# Patient Record
Sex: Female | Born: 1940 | Race: White | Hispanic: No | Smoking: Never smoker
Health system: Southern US, Community
[De-identification: ages and names within clinical notes are randomized; demographics above are authoritative.]

## PROBLEM LIST (undated history)

## (undated) DIAGNOSIS — I4891 Unspecified atrial fibrillation: Secondary | ICD-10-CM

## (undated) DIAGNOSIS — I10 Essential (primary) hypertension: Secondary | ICD-10-CM

## (undated) DIAGNOSIS — I509 Heart failure, unspecified: Secondary | ICD-10-CM

## (undated) DIAGNOSIS — G4730 Sleep apnea, unspecified: Secondary | ICD-10-CM

## (undated) HISTORY — PX: POLYPECTOMY: SHX149

## (undated) HISTORY — PX: CARPAL TUNNEL RELEASE: SHX101

## (undated) HISTORY — PX: EYE SURGERY: SHX253

---

## 2003-05-22 LAB — HM DEXA SCAN: HM Dexa Scan: NORMAL

## 2003-07-21 ENCOUNTER — Other Ambulatory Visit: Payer: Self-pay

## 2004-02-06 ENCOUNTER — Ambulatory Visit: Payer: Self-pay | Admitting: Gastroenterology

## 2005-02-18 ENCOUNTER — Ambulatory Visit: Payer: Self-pay

## 2005-03-03 ENCOUNTER — Ambulatory Visit: Payer: Self-pay

## 2005-08-22 ENCOUNTER — Ambulatory Visit: Payer: Self-pay | Admitting: General Surgery

## 2006-07-27 ENCOUNTER — Ambulatory Visit: Payer: Self-pay | Admitting: Specialist

## 2006-11-19 ENCOUNTER — Ambulatory Visit: Payer: Self-pay

## 2006-11-25 ENCOUNTER — Ambulatory Visit: Payer: Self-pay

## 2007-07-12 ENCOUNTER — Ambulatory Visit: Payer: Self-pay | Admitting: General Surgery

## 2007-11-17 ENCOUNTER — Ambulatory Visit: Payer: Self-pay | Admitting: Unknown Physician Specialty

## 2008-11-09 ENCOUNTER — Ambulatory Visit: Payer: Self-pay

## 2009-10-11 ENCOUNTER — Ambulatory Visit: Payer: Self-pay | Admitting: Family Medicine

## 2010-02-15 ENCOUNTER — Ambulatory Visit: Payer: Self-pay

## 2011-04-16 ENCOUNTER — Ambulatory Visit: Payer: Self-pay | Admitting: Family Medicine

## 2011-04-16 ENCOUNTER — Ambulatory Visit: Payer: Self-pay

## 2012-04-20 ENCOUNTER — Ambulatory Visit: Payer: Self-pay

## 2013-02-24 ENCOUNTER — Ambulatory Visit: Payer: Self-pay | Admitting: Family Medicine

## 2013-03-03 ENCOUNTER — Ambulatory Visit: Payer: Self-pay | Admitting: Unknown Physician Specialty

## 2013-03-03 LAB — HM COLONOSCOPY

## 2013-03-07 LAB — PATHOLOGY REPORT

## 2013-06-13 ENCOUNTER — Ambulatory Visit: Payer: Self-pay | Admitting: Family Medicine

## 2013-07-15 DIAGNOSIS — G4730 Sleep apnea, unspecified: Secondary | ICD-10-CM | POA: Insufficient documentation

## 2013-07-15 DIAGNOSIS — I480 Paroxysmal atrial fibrillation: Secondary | ICD-10-CM | POA: Insufficient documentation

## 2013-07-15 DIAGNOSIS — I10 Essential (primary) hypertension: Secondary | ICD-10-CM | POA: Insufficient documentation

## 2013-07-15 DIAGNOSIS — Z9889 Other specified postprocedural states: Secondary | ICD-10-CM | POA: Insufficient documentation

## 2013-09-13 LAB — LIPID PANEL
CHOLESTEROL: 169 mg/dL (ref 0–200)
HDL: 46 mg/dL (ref 35–70)
LDL Cholesterol: 99 mg/dL
LDL/HDL RATIO: 2.2
Triglycerides: 122 mg/dL (ref 40–160)

## 2013-09-13 LAB — CBC AND DIFFERENTIAL
HEMATOCRIT: 38 % (ref 36–46)
Hemoglobin: 13.3 g/dL (ref 12.0–16.0)
Neutrophils Absolute: 60 /uL
PLATELETS: 231 10*3/uL (ref 150–399)
WBC: 8.3 10*3/mL

## 2013-09-13 LAB — TSH: TSH: 1.36 u[IU]/mL (ref <=5.90)

## 2013-09-13 LAB — BASIC METABOLIC PANEL
BUN: 12 mg/dL (ref 4–21)
Creatinine: 0.7 mg/dL (ref <=1.1)
Glucose: 115 mg/dL
Potassium: 3.7 mmol/L (ref 3.4–5.3)
Sodium: 142 mmol/L (ref 137–147)

## 2013-09-13 LAB — HEPATIC FUNCTION PANEL
ALT: 9 U/L (ref 7–35)
AST: 15 U/L (ref 13–35)
Alkaline Phosphatase: 74 U/L (ref 25–125)
BILIRUBIN, TOTAL: 0.5 mg/dL

## 2013-09-13 LAB — HEMOGLOBIN A1C: Hgb A1c MFr Bld: 5.8 % (ref 4.0–6.0)

## 2014-04-25 DIAGNOSIS — I4891 Unspecified atrial fibrillation: Secondary | ICD-10-CM | POA: Diagnosis not present

## 2014-05-10 DIAGNOSIS — E119 Type 2 diabetes mellitus without complications: Secondary | ICD-10-CM | POA: Diagnosis not present

## 2014-05-10 DIAGNOSIS — E669 Obesity, unspecified: Secondary | ICD-10-CM | POA: Diagnosis not present

## 2014-05-10 DIAGNOSIS — I10 Essential (primary) hypertension: Secondary | ICD-10-CM | POA: Diagnosis not present

## 2014-05-10 DIAGNOSIS — E785 Hyperlipidemia, unspecified: Secondary | ICD-10-CM | POA: Diagnosis not present

## 2014-05-23 DIAGNOSIS — I4891 Unspecified atrial fibrillation: Secondary | ICD-10-CM | POA: Diagnosis not present

## 2014-06-20 DIAGNOSIS — I4891 Unspecified atrial fibrillation: Secondary | ICD-10-CM | POA: Diagnosis not present

## 2014-06-25 DIAGNOSIS — J069 Acute upper respiratory infection, unspecified: Secondary | ICD-10-CM | POA: Diagnosis not present

## 2014-06-29 DIAGNOSIS — J069 Acute upper respiratory infection, unspecified: Secondary | ICD-10-CM | POA: Diagnosis not present

## 2014-07-12 ENCOUNTER — Ambulatory Visit
Admit: 2014-07-12 | Disposition: A | Discharge status: Home / Self Care | Payer: Self-pay | Attending: Family Medicine | Admitting: Family Medicine

## 2014-07-12 DIAGNOSIS — Z1231 Encounter for screening mammogram for malignant neoplasm of breast: Secondary | ICD-10-CM | POA: Diagnosis not present

## 2014-07-18 DIAGNOSIS — I4891 Unspecified atrial fibrillation: Secondary | ICD-10-CM | POA: Diagnosis not present

## 2014-08-15 DIAGNOSIS — I482 Chronic atrial fibrillation: Secondary | ICD-10-CM | POA: Diagnosis not present

## 2014-08-25 DIAGNOSIS — G4733 Obstructive sleep apnea (adult) (pediatric): Secondary | ICD-10-CM | POA: Insufficient documentation

## 2014-08-25 DIAGNOSIS — I10 Essential (primary) hypertension: Secondary | ICD-10-CM | POA: Insufficient documentation

## 2014-08-25 DIAGNOSIS — G5790 Unspecified mononeuropathy of unspecified lower limb: Secondary | ICD-10-CM | POA: Insufficient documentation

## 2014-08-25 DIAGNOSIS — F419 Anxiety disorder, unspecified: Secondary | ICD-10-CM | POA: Insufficient documentation

## 2014-08-25 DIAGNOSIS — F329 Major depressive disorder, single episode, unspecified: Secondary | ICD-10-CM | POA: Insufficient documentation

## 2014-08-25 DIAGNOSIS — E669 Obesity, unspecified: Secondary | ICD-10-CM | POA: Insufficient documentation

## 2014-08-25 DIAGNOSIS — D126 Benign neoplasm of colon, unspecified: Secondary | ICD-10-CM | POA: Insufficient documentation

## 2014-08-25 DIAGNOSIS — E7800 Pure hypercholesterolemia, unspecified: Secondary | ICD-10-CM | POA: Insufficient documentation

## 2014-08-25 DIAGNOSIS — E119 Type 2 diabetes mellitus without complications: Secondary | ICD-10-CM | POA: Insufficient documentation

## 2014-08-25 DIAGNOSIS — F32A Depression, unspecified: Secondary | ICD-10-CM | POA: Insufficient documentation

## 2014-08-25 DIAGNOSIS — E1140 Type 2 diabetes mellitus with diabetic neuropathy, unspecified: Secondary | ICD-10-CM | POA: Insufficient documentation

## 2014-08-25 DIAGNOSIS — I482 Chronic atrial fibrillation, unspecified: Secondary | ICD-10-CM | POA: Insufficient documentation

## 2014-08-25 DIAGNOSIS — E538 Deficiency of other specified B group vitamins: Secondary | ICD-10-CM | POA: Insufficient documentation

## 2014-08-25 DIAGNOSIS — Z229 Carrier of infectious disease, unspecified: Secondary | ICD-10-CM | POA: Insufficient documentation

## 2014-09-06 ENCOUNTER — Other Ambulatory Visit: Payer: Self-pay | Admitting: Family Medicine

## 2014-09-06 ENCOUNTER — Encounter: Payer: Self-pay | Admitting: Family Medicine

## 2014-09-06 ENCOUNTER — Other Ambulatory Visit: Payer: Self-pay

## 2014-09-06 NOTE — Telephone Encounter (Signed)
This has a controlled Rx and will be filled by Dr. Darnell Level

## 2014-09-12 DIAGNOSIS — I482 Chronic atrial fibrillation: Secondary | ICD-10-CM | POA: Diagnosis not present

## 2014-09-15 ENCOUNTER — Other Ambulatory Visit: Payer: Self-pay | Admitting: Family Medicine

## 2014-10-02 ENCOUNTER — Other Ambulatory Visit: Payer: Self-pay | Admitting: Family Medicine

## 2014-10-11 ENCOUNTER — Ambulatory Visit (INDEPENDENT_AMBULATORY_CARE_PROVIDER_SITE_OTHER): Payer: Medicare Other | Admitting: Family Medicine

## 2014-10-11 ENCOUNTER — Encounter: Payer: Self-pay | Admitting: Family Medicine

## 2014-10-11 VITALS — BP 158/78 | HR 88 | Temp 98.2°F | Resp 16 | Ht 59.75 in | Wt 200.0 lb

## 2014-10-11 DIAGNOSIS — Z0000 Encounter for general adult medical examination without abnormal findings: Secondary | ICD-10-CM | POA: Diagnosis not present

## 2014-10-11 NOTE — Progress Notes (Signed)
Patient ID: Hannah Faulkner, female   DOB: 11-11-1940, 74 y.o.   MRN: [id number] Patient: Hannah Faulkner, Female    DOB: [birth year], 74 y.o.   MRN: [id number] Visit Date: 10/11/2014  Today's Provider: Wilhemena Durie, MD   Chief Complaint  Patient presents with  . Annual Exam   Subjective:   Hannah Faulkner is a 74 y.o. female who presents today for her Subsequent Annual Wellness Visit. She feels well. She reports she is not exercising. She reports she is sleeping well.  Review of Systems  Constitutional: Negative.   HENT: Negative.   Eyes: Negative.   Respiratory: Negative.   Cardiovascular: Negative.   Gastrointestinal: Negative.   Endocrine: Negative.   Genitourinary: Negative.   Musculoskeletal: Negative.   Skin: Negative.   Allergic/Immunologic: Negative.   Neurological: Negative.   Hematological: Negative.   Psychiatric/Behavioral: Negative.     Patient Active Problem List   Diagnosis Date Noted  . Anxiety 08/25/2014  . A-fib 08/25/2014  . Benign neoplasm of colon 08/25/2014  . 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 Diabetic neuropathy 08/25/2014  . Essential (primary) hypertension 08/25/2014  . 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 Adiposity 08/25/2014  . Obstructive apnea 08/25/2014  . Hypercholesterolemia without hypertriglyceridemia 08/25/2014  . B12 deficiency 08/25/2014  . H/O cardiac catheterization 07/15/2013  . BP (high blood pressure) 07/15/2013  . Apnea, sleep 07/15/2013  . AF (paroxysmal atrial fibrillation) 07/15/2013    History   Social History  . Marital Status: Widowed    Spouse Name: N/A  . Number of Children: N/A  . Years of Education: N/A   Occupational History  . Not on file.   Social History Main Topics  . Smoking status: Never Smoker   . Smokeless tobacco: Not on file  . Alcohol Use: No  . Drug Use: No  . Sexual Activity: Not on file   Other Topics Concern   . Not on file   Social History Narrative    Past Surgical History  Procedure Laterality Date  . Polypectomy      colon poly premoved    Her family history includes Atrial fibrillation in her sister; CVA in her mother and sister; Dementia in her sister; Diabetes in her sister; Heart attack in her mother and sister; Heart disease in her mother; Hypertension in her mother; Liver cancer in her brother; Lung cancer in her father; Thyroid disease in her sister, sister, and sister.    Outpatient Prescriptions Prior to Visit  Medication Sig Dispense Refill  . flecainide (TAMBOCOR) 50 MG tablet Take by mouth.    . gabapentin (NEURONTIN) 100 MG capsule TAKE ONE CAPSULE BY MOUTH 3 TIMES A DAY 90 capsule 4  . hydrALAZINE (APRESOLINE) 25 MG tablet Take by mouth.    . metoprolol (LOPRESSOR) 50 MG tablet Take by mouth.    . potassium chloride (K-DUR) 10 MEQ tablet Take by mouth.    . Pyridoxine HCl (VITAMIN B6) 200 MG TABS Take by mouth.    . triamterene-hydrochlorothiazide (MAXZIDE-25) 37.5-25 MG per tablet TAKE 1 TABLET BY MOUTH EVERY DAY 30 tablet 4  . warfarin (COUMADIN) 2.5 MG tablet Take by mouth.    . ALPRAZolam (XANAX) 0.5 MG tablet TAKE 1/2 TO 1 TABLET BY MOUTH TWICE A DAY AS NEEDED ANXIETY 60 tablet 5  . cyanocobalamin 100 MCG tablet Take by mouth.     No facility-administered medications prior to visit.  Allergies  Allergen Reactions  . Ampicillin   . Penicillins   . Prinzide  [Lisinopril-Hydrochlorothiazide]     Patient Care Team: Jerrol Banana., MD as PCP - General (Family Medicine)  Objective:   Vitals:  Filed Vitals:   10/11/14 1425  BP: 158/78  Pulse: 88  Temp: 98.2 F (36.8 C)  TempSrc: Oral  Resp: 16  Height: 4' 11.75" (1.518 m)  Weight: 200 lb (90.719 kg)    Physical Exam  Constitutional: She is oriented to person, place, and time. She appears well-developed and well-nourished.  HENT:  Head: Normocephalic and atraumatic.  Right Ear: External  ear normal.  Left Ear: External ear normal.  Nose: Nose normal.  Eyes: Conjunctivae are normal. Pupils are equal, round, and reactive to light.  Neck: Normal range of motion. Neck supple.  Cardiovascular: Normal rate, regular rhythm, normal heart sounds and intact distal pulses.   Spider varicose veins of both lower extremities. right greater than left  Pulmonary/Chest: Effort normal and breath sounds normal.  Abdominal: Soft. Bowel sounds are normal.  Neurological: She is alert and oriented to person, place, and time.  Skin: Skin is warm and dry.  Psychiatric: She has a normal mood and affect. Her behavior is normal. Judgment and thought content normal.    Activities of Daily Living In your present state of health, do you have any difficulty performing the following activities: 10/11/2014  Hearing? N  Vision? N  Difficulty concentrating or making decisions? N  Walking or climbing stairs? N  Dressing or bathing? N  Doing errands, shopping? N    Fall Risk Assessment Fall Risk  10/11/2014  Falls in the past year? No     Depression Screen PHQ 2/9 Scores 10/11/2014  PHQ - 2 Score 0    Cognitive Testing - 6-CIT    Year: 0 4 points--0  Month: 0 3 points--0  Memorize "Pia Mau, [address] [place]"  Time (within 1 hour:) 0 3 points--0  Count backwards from 20: 0 2 4 points--0   Name months of year: 0 2 4 points--0  Repeat Address: 0 2 4 6 8 10  points--   Total Score: 2/28  Interpretation : Normal (0-7) Abnormal (8-28)    Assessment & Plan:     Annual Wellness Visit  Reviewed patient's Family Medical History Reviewed and updated list of patient's medical providers Assessment of cognitive impairment was done Assessed patient's functional ability Established a written schedule for health screening [place] Completed and Reviewed  Exercise Activities and Dietary recommendations Goals    None      Immunization History  Administered  Date(s) Administered  . Pneumococcal Conjugate-13 02/08/2014  . Pneumococcal Polysaccharide-23 01/30/2002, 01/21/2012  . Td 10/17/2009  . Tdap 01/21/2012  . Zoster 02/21/2013    Health Maintenance  Topic Date Due  . FOOT EXAM  08/09/1950  . OPHTHALMOLOGY EXAM  08/09/1950  . URINE MICROALBUMIN  08/09/1950  . HEMOGLOBIN A1C  03/15/2014  . INFLUENZA VACCINE  10/23/2014  . MAMMOGRAM  07/12/2016  . TETANUS/TDAP  01/20/2022  . COLONOSCOPY  03/04/2023  . DEXA SCAN  Completed  . ZOSTAVAX  Completed  . PNA vac Low Risk Adult  Completed      Discussed health benefits of physical activity, and encouraged her to engage in regular exercise appropriate for her age and condition.    Diabetic neuropathy. Gabapentin has helped and patient would like to increase it a little bit. We'll go to 100 mg  4 times a day. She does not want to go to a bigger dose and that presently.  Miguel Aschoff MD [facility] 10/11/2014 2:29 PM  ------------------------------------------------------------------------------------------------------------

## 2014-10-18 DIAGNOSIS — I480 Paroxysmal atrial fibrillation: Secondary | ICD-10-CM | POA: Diagnosis not present

## 2014-10-18 DIAGNOSIS — Z9889 Other specified postprocedural states: Secondary | ICD-10-CM | POA: Diagnosis not present

## 2014-10-18 DIAGNOSIS — I10 Essential (primary) hypertension: Secondary | ICD-10-CM | POA: Diagnosis not present

## 2014-10-18 DIAGNOSIS — I482 Chronic atrial fibrillation: Secondary | ICD-10-CM | POA: Diagnosis not present

## 2014-11-15 DIAGNOSIS — I482 Chronic atrial fibrillation: Secondary | ICD-10-CM | POA: Diagnosis not present

## 2014-12-13 DIAGNOSIS — I482 Chronic atrial fibrillation: Secondary | ICD-10-CM | POA: Diagnosis not present

## 2014-12-27 ENCOUNTER — Ambulatory Visit (INDEPENDENT_AMBULATORY_CARE_PROVIDER_SITE_OTHER): Payer: Medicare Other | Admitting: Family Medicine

## 2014-12-27 DIAGNOSIS — M549 Dorsalgia, unspecified: Secondary | ICD-10-CM
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
## 2014-12-29 ENCOUNTER — Encounter: Payer: Self-pay | Admitting: Family Medicine

## 2015-01-10 DIAGNOSIS — I482 Chronic atrial fibrillation: Secondary | ICD-10-CM | POA: Diagnosis not present

## 2015-01-16 ENCOUNTER — Other Ambulatory Visit: Payer: Self-pay | Admitting: Family Medicine

## 2015-01-17 ENCOUNTER — Ambulatory Visit (INDEPENDENT_AMBULATORY_CARE_PROVIDER_SITE_OTHER): Payer: Medicare Other

## 2015-01-17 DIAGNOSIS — Z23 Encounter for immunization: Secondary | ICD-10-CM | POA: Diagnosis not present

## 2015-01-17 NOTE — Telephone Encounter (Signed)
Dr. Alben Spittle patient. Please review. Thank you-aa

## 2015-02-07 DIAGNOSIS — I482 Chronic atrial fibrillation: Secondary | ICD-10-CM | POA: Diagnosis not present

## 2015-02-12 ENCOUNTER — Ambulatory Visit (INDEPENDENT_AMBULATORY_CARE_PROVIDER_SITE_OTHER): Payer: Medicare Other | Admitting: Family Medicine

## 2015-02-12 VITALS — BP 162/58 | HR 84 | Temp 98.3°F | Resp 16 | Wt 200.0 lb

## 2015-02-12 DIAGNOSIS — I10 Essential (primary) hypertension: Secondary | ICD-10-CM | POA: Diagnosis not present

## 2015-02-12 DIAGNOSIS — E538 Deficiency of other specified B group vitamins: Secondary | ICD-10-CM

## 2015-02-12 DIAGNOSIS — E0842 Diabetes mellitus due to underlying condition with diabetic polyneuropathy: Secondary | ICD-10-CM

## 2015-02-12 DIAGNOSIS — E785 Hyperlipidemia, unspecified: Secondary | ICD-10-CM | POA: Diagnosis not present

## 2015-02-12 DIAGNOSIS — E119 Type 2 diabetes mellitus without complications: Secondary | ICD-10-CM

## 2015-02-12 NOTE — Progress Notes (Signed)
Patient ID: Hannah Faulkner, female   DOB: 04/01/1940, 74 y.o.   MRN: [id number]   Hannah Faulkner  MRN: [id number] DOB: Jul 31, 1940  Subjective:  HPI   1. Essential hypertension Patient is a 74 year old female who presents today for follow up of her hypertension.  She occasionally checks her blood pressure outside of the office and gets readings of 150's over 60's.  She was last seen on 12/27/14 and her pressure was 164/90.  She reports she has not had any more episode of Atrial Fib.  She is compliant on her medication and reports no side effects.  2. Type 2 diabetes mellitus without complication, without long-term current use of insulin ([place]) The patient is also here to follow up on her hyperglycemia.  She is not on any medication for diabetes and does not check her glucose at home at this point.  .    3. Hyperlipemia Patient is also due to have her cholesterol checked.   Patient Active Problem List   Diagnosis Date Noted  . Anxiety 08/25/2014  . A-fib ([place]) 08/25/2014  . Benign neoplasm of colon 08/25/2014  . 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 Diabetic neuropathy ([place]) 08/25/2014  . Essential (primary) hypertension 08/25/2014  . 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 Adiposity 08/25/2014  . Obstructive apnea 08/25/2014  . Hypercholesterolemia without hypertriglyceridemia 08/25/2014  . B12 deficiency 08/25/2014  . H/O cardiac catheterization 07/15/2013  . BP (high blood pressure) 07/15/2013  . Apnea, sleep 07/15/2013  . AF (paroxysmal atrial fibrillation) ([place]) 07/15/2013    No past medical history on file.  Social History   Social History  . Marital Status: Widowed    Spouse Name: N/A  . Number of Children: N/A  . Years of Education: N/A   Occupational History  . Not on file.   Social History Main Topics  . Smoking status: Never Smoker   . Smokeless tobacco: Not on file  . Alcohol Use: No  . Drug  Use: No  . Sexual Activity: Not on file   Other Topics Concern  . Not on file   Social History Narrative    Outpatient Prescriptions Prior to Visit  Medication Sig Dispense Refill  . ALPRAZolam (XANAX) 0.5 MG tablet Take by mouth.    . cyanocobalamin 100 MCG tablet Take by mouth.    . flecainide (TAMBOCOR) 50 MG tablet Take by mouth.    . gabapentin (NEURONTIN) 100 MG capsule TAKE ONE CAPSULE BY MOUTH 3 TIMES A DAY 90 capsule 1  . hydrALAZINE (APRESOLINE) 25 MG tablet Take by mouth.    . metoprolol (LOPRESSOR) 50 MG tablet Take by mouth.    . potassium chloride (K-DUR) 10 MEQ tablet Take by mouth.    . Pyridoxine HCl (VITAMIN B6) 200 MG TABS Take by mouth.    . triamterene-hydrochlorothiazide (MAXZIDE-25) 37.5-25 MG per tablet TAKE 1 TABLET BY MOUTH EVERY DAY 30 tablet 4  . warfarin (COUMADIN) 2.5 MG tablet Take by mouth.     No facility-administered medications prior to visit.    Allergies  Allergen Reactions  . Ampicillin   . Penicillins   . Prinzide  [Lisinopril-Hydrochlorothiazide]     Review of Systems  Constitutional: Negative.   Eyes: Negative.   Respiratory: Positive for cough.   Cardiovascular: Negative.   Gastrointestinal: Negative.   Genitourinary: Negative.   Musculoskeletal: Positive for joint pain.  Skin: Negative.   Neurological: Negative for dizziness  and headaches.  Endo/Heme/Allergies: Negative.   Psychiatric/Behavioral: Negative.    Objective:  BP 162/58 mmHg  Pulse 84  Temp(Src) 98.3 F (36.8 C) (Oral)  Resp 16  Wt 200 lb (90.719 kg)  Physical Exam  Constitutional: She is oriented to person, place, and time and well-developed, well-nourished, and in no distress.  HENT:  Head: Normocephalic and atraumatic.  Right Ear: External ear normal.  Left Ear: External ear normal.  Nose: Nose normal.  Eyes: Conjunctivae are normal.  Neck: Neck supple.  Cardiovascular: Normal rate, regular rhythm and normal heart sounds.   Pulmonary/Chest: Breath  sounds normal.  Abdominal: Soft.  Neurological: She is alert and oriented to person, place, and time.  Skin: Skin is warm and dry.  Psychiatric: Mood, memory, affect and judgment normal.    Assessment and Plan :  Essential hypertension  Type 2 diabetes mellitus without complication, without long-term current use of insulin (HCC)  Hyperlipemia Obesity Diabetic Neuropathy Helped by gabapentin. Check systolic blood pressure and follow with home. She will bring in these readings on her next visit. Miguel Aschoff MD [facility] 02/12/2015 2:08 PM

## 2015-02-22 DIAGNOSIS — I10 Essential (primary) hypertension: Secondary | ICD-10-CM | POA: Diagnosis not present

## 2015-02-22 DIAGNOSIS — E785 Hyperlipidemia, unspecified: Secondary | ICD-10-CM | POA: Diagnosis not present

## 2015-02-22 DIAGNOSIS — E119 Type 2 diabetes mellitus without complications: Secondary | ICD-10-CM | POA: Diagnosis not present

## 2015-02-26 ENCOUNTER — Other Ambulatory Visit: Payer: Self-pay | Admitting: Family Medicine

## 2015-02-26 LAB — CBC WITH DIFFERENTIAL/PLATELET

## 2015-02-26 LAB — COMPREHENSIVE METABOLIC PANEL
A/G RATIO: 1.6 (ref 1.1–2.5)
ALT: 17 IU/L (ref 0–32)
AST: 21 IU/L (ref 0–40)
Albumin: 4.1 g/dL (ref 3.5–4.8)
Alkaline Phosphatase: 64 IU/L (ref 39–117)
BUN/Creatinine Ratio: 19 (ref 11–26)
BUN: 14 mg/dL (ref 8–27)
Bilirubin Total: 0.6 mg/dL (ref 0.0–1.2)
CO2: 26 mmol/L (ref 18–29)
CREATININE: 0.73 mg/dL (ref 0.57–1.00)
Calcium: 9 mg/dL (ref 8.7–10.3)
Chloride: 102 mmol/L (ref 97–106)
GFR calc Af Amer: 94 mL/min/{1.73_m2} (ref >59)
GFR calc non Af Amer: 81 mL/min/{1.73_m2} (ref >59)
GLOBULIN, TOTAL: 2.6 g/dL (ref 1.5–4.5)
Glucose: 103 mg/dL — ABNORMAL HIGH (ref 65–99)
Potassium: 4 mmol/L (ref 3.5–5.2)
Sodium: 142 mmol/L (ref 136–144)
Total Protein: 6.7 g/dL (ref 6.0–8.5)

## 2015-02-26 LAB — LIPID PANEL WITH LDL/HDL RATIO
Cholesterol, Total: 164 mg/dL (ref 100–199)
HDL: 49 mg/dL (ref >39)
LDL Calculated: 93 mg/dL (ref 0–99)
LDl/HDL Ratio: 1.9 ratio units (ref 0.0–3.2)
Triglycerides: 112 mg/dL (ref 0–149)
VLDL CHOLESTEROL CAL: 22 mg/dL (ref 5–40)

## 2015-02-26 LAB — TSH: TSH: 1.39 u[IU]/mL (ref 0.450–4.500)

## 2015-02-26 LAB — HEMOGLOBIN A1C
Est. average glucose Bld gHb Est-mCnc: 126 mg/dL
Hgb A1c MFr Bld: 6 % — ABNORMAL HIGH (ref 4.8–5.6)

## 2015-03-11 ENCOUNTER — Other Ambulatory Visit: Payer: Self-pay | Admitting: Family Medicine

## 2015-03-12 ENCOUNTER — Ambulatory Visit (INDEPENDENT_AMBULATORY_CARE_PROVIDER_SITE_OTHER): Payer: Medicare Other | Admitting: Family Medicine

## 2015-03-12 ENCOUNTER — Encounter: Payer: Self-pay | Admitting: Family Medicine

## 2015-03-12 VITALS — BP 158/86 | HR 92 | Temp 99.8°F | Resp 18 | Wt 195.0 lb

## 2015-03-12 DIAGNOSIS — H6693 Otitis media, unspecified, bilateral: Secondary | ICD-10-CM

## 2015-03-12 DIAGNOSIS — R509 Fever, unspecified: Secondary | ICD-10-CM

## 2015-03-12 LAB — POCT INFLUENZA A/B
INFLUENZA A, POC: NEGATIVE
Influenza B, POC: NEGATIVE

## 2015-03-12 MED ORDER — AZITHROMYCIN 250 MG PO TABS: ORAL_TABLET | ORAL | Status: DC

## 2015-03-12 NOTE — Progress Notes (Signed)
Patient ID: Hannah Faulkner, female   DOB: 22-Jan-1941, 74 y.o.   MRN: [id number]    Subjective:  HPI Pt reports that about 2 weeks ago she had a cold. Then this past Friday night she started feeling bad and running a fever of about 101, the highest. She reports that she was raking leaves this past week. She has had a headache, chills, cough with white thick sputum, shortness of breath with coughing, right ear pain and post nasal drainage. Denies any nasal congestion or sinus pain or pressure, sore throat, body aches or rash.  Prior to Admission medications   Medication Sig Start Date End Date Taking? Authorizing Provider  ALPRAZolam Duanne Moron) 0.5 MG tablet Take by mouth. 01/05/14  Yes Historical Provider, MD  cyanocobalamin 100 MCG tablet Take by mouth. 02/24/11  Yes Historical Provider, MD  flecainide (TAMBOCOR) 50 MG tablet Take by mouth. 11/16/13  Yes Historical Provider, MD  gabapentin (NEURONTIN) 100 MG capsule TAKE ONE CAPSULE BY MOUTH 3 TIMES A DAY 03/12/15  Yes Birdie Sons, MD  hydrALAZINE (APRESOLINE) 25 MG tablet Take by mouth. 01/04/14  Yes Historical Provider, MD  metoprolol (LOPRESSOR) 50 MG tablet Take by mouth. 11/16/13  Yes Historical Provider, MD  potassium chloride (K-DUR) 10 MEQ tablet Take by mouth. 11/16/13  Yes Historical Provider, MD  Pyridoxine HCl (VITAMIN B6) 200 MG TABS Take by mouth. 11/16/13  Yes Historical Provider, MD  triamterene-hydrochlorothiazide (MAXZIDE-25) 37.5-25 MG tablet TAKE 1 TABLET BY MOUTH EVERY DAY 02/26/15  Yes Jerrol Banana., MD  warfarin (COUMADIN) 2.5 MG tablet Take by mouth. 11/16/13  Yes Historical Provider, MD    Patient Active Problem List   Diagnosis Date Noted  . Anxiety 08/25/2014  . A-fib ([place]) 08/25/2014  . Benign neoplasm of colon 08/25/2014  . 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 Diabetic neuropathy ([place]) 08/25/2014  . Essential (primary) hypertension 08/25/2014  . Carrier or suspected carrier of infectious organism 08/25/2014    . 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 Adiposity 08/25/2014  . Obstructive apnea 08/25/2014  . Hypercholesterolemia without hypertriglyceridemia 08/25/2014  . B12 deficiency 08/25/2014  . H/O cardiac catheterization 07/15/2013  . BP (high blood pressure) 07/15/2013  . Apnea, sleep 07/15/2013  . AF (paroxysmal atrial fibrillation) ([place]) 07/15/2013    History reviewed. No pertinent past medical history.  Social History   Social History  . Marital Status: Widowed    Spouse Name: N/A  . Number of Children: N/A  . Years of Education: N/A   Occupational History  . Not on file.   Social History Main Topics  . Smoking status: Never Smoker   . Smokeless tobacco: Not on file  . Alcohol Use: No  . Drug Use: No  . Sexual Activity: Not on file   Other Topics Concern  . Not on file   Social History Narrative    Allergies  Allergen Reactions  . Ampicillin   . Penicillins   . Prinzide  [Lisinopril-Hydrochlorothiazide]     Review of Systems  Constitutional: Positive for fever, chills and malaise/fatigue.  HENT: Positive for congestion and ear pain.   Eyes: Positive for pain.  Respiratory: Positive for cough, sputum production and shortness of breath.   Cardiovascular: Negative.   Gastrointestinal: Negative.   Genitourinary: Negative.   Musculoskeletal: Negative.   Skin: Negative.   Neurological: Positive for headaches.  Endo/Heme/Allergies: Negative.   Psychiatric/Behavioral: Negative.     Immunization History  Administered Date(s) Administered  . Influenza, High Dose Seasonal  PF 01/17/2015  . Pneumococcal Conjugate-13 02/08/2014  . Pneumococcal Polysaccharide-23 01/30/2002, 01/21/2012  . Td 10/17/2009  . Tdap 01/21/2012  . Zoster 02/21/2013   Objective:  BP 158/86 mmHg  Pulse 92  Temp(Src) 99.8 F (37.7 C) (Oral)  Resp 18  Wt 195 lb (88.451 kg)  Physical Exam  Constitutional: She is oriented to person, place, and time and well-developed,  well-nourished, and in no distress.  HENT:  Head: Normocephalic and atraumatic.  Right Ear: External ear normal.  Left Ear: External ear normal.  Mouth/Throat: Oropharynx is clear and moist.  Full and mildly erythemas B/L.  Eyes: Conjunctivae and EOM are normal. Pupils are equal, round, and reactive to light.  Neck: Normal range of motion. No thyromegaly present.  Cardiovascular: Normal rate, regular rhythm, normal heart sounds and intact distal pulses.   Pulmonary/Chest: Effort normal and breath sounds normal.  Musculoskeletal: Normal range of motion.  Lymphadenopathy:    She has no cervical adenopathy.  Neurological: She is alert and oriented to person, place, and time. She has normal reflexes. Gait normal. GCS score is 15.  Skin: Skin is warm and dry.  Psychiatric: Mood, memory, affect and judgment normal.    Lab Results  Component Value Date   WBC CANCELED 02/22/2015   HGB 13.3 09/13/2013   HCT CANCELED 02/22/2015   PLT 231 09/13/2013   GLUCOSE 103* 02/22/2015   CHOL 164 02/22/2015   TRIG 112 02/22/2015   HDL 49 02/22/2015   LDLCALC 93 02/22/2015   TSH 1.390 02/22/2015   HGBA1C 6.0* 02/22/2015    CMP     Component Value Date/Time   NA 142 02/22/2015 0917   K 4.0 02/22/2015 0917   CL 102 02/22/2015 0917   CO2 26 02/22/2015 0917   GLUCOSE 103* 02/22/2015 0917   BUN 14 02/22/2015 0917   CREATININE 0.73 02/22/2015 0917   CREATININE 0.7 09/13/2013   CALCIUM 9.0 02/22/2015 0917   PROT 6.7 02/22/2015 0917   ALBUMIN 4.1 02/22/2015 0917   AST 21 02/22/2015 0917   ALT 17 02/22/2015 0917   ALKPHOS 64 02/22/2015 0917   BILITOT 0.6 02/22/2015 0917   GFRNONAA 81 02/22/2015 0917   GFRAA 94 02/22/2015 0917    Assessment and Plan :  1. Fever, unspecified fever cause  - POCT Influenza A/B  2. Bilateral acute otitis media, recurrence not specified, unspecified otitis media type  - azithromycin (ZITHROMAX Z-PAK) 250 MG tablet; Take 2 the first day and then one daily  until finished.  Dispense: 6 each; Refill: 0 I have done the exam and reviewed the above chart and it is accurate to the best of my knowledge.  Patient was seen and examined by Dr. Miguel Aschoff, and noted scribed by Webb Laws, Rhea MD [facility] 03/12/2015 1:54 PM

## 2015-03-20 DIAGNOSIS — I10 Essential (primary) hypertension: Secondary | ICD-10-CM | POA: Diagnosis not present

## 2015-03-20 DIAGNOSIS — I480 Paroxysmal atrial fibrillation: Secondary | ICD-10-CM | POA: Diagnosis not present

## 2015-03-20 DIAGNOSIS — E1140 Type 2 diabetes mellitus with diabetic neuropathy, unspecified: Secondary | ICD-10-CM | POA: Diagnosis not present

## 2015-03-20 DIAGNOSIS — I482 Chronic atrial fibrillation: Secondary | ICD-10-CM | POA: Diagnosis not present

## 2015-03-20 DIAGNOSIS — G4733 Obstructive sleep apnea (adult) (pediatric): Secondary | ICD-10-CM | POA: Diagnosis not present

## 2015-03-27 ENCOUNTER — Other Ambulatory Visit: Payer: Self-pay | Admitting: Family Medicine

## 2015-03-27 DIAGNOSIS — I4891 Unspecified atrial fibrillation: Secondary | ICD-10-CM | POA: Diagnosis not present

## 2015-03-30 DIAGNOSIS — I480 Paroxysmal atrial fibrillation: Secondary | ICD-10-CM | POA: Diagnosis not present

## 2015-04-04 DIAGNOSIS — Z9889 Other specified postprocedural states: Secondary | ICD-10-CM | POA: Diagnosis not present

## 2015-04-04 DIAGNOSIS — I480 Paroxysmal atrial fibrillation: Secondary | ICD-10-CM | POA: Diagnosis not present

## 2015-04-04 DIAGNOSIS — G4733 Obstructive sleep apnea (adult) (pediatric): Secondary | ICD-10-CM | POA: Diagnosis not present

## 2015-04-04 DIAGNOSIS — I482 Chronic atrial fibrillation: Secondary | ICD-10-CM | POA: Diagnosis not present

## 2015-04-04 DIAGNOSIS — I10 Essential (primary) hypertension: Secondary | ICD-10-CM | POA: Diagnosis not present

## 2015-04-18 DIAGNOSIS — I482 Chronic atrial fibrillation: Secondary | ICD-10-CM | POA: Diagnosis not present

## 2015-05-15 ENCOUNTER — Ambulatory Visit (INDEPENDENT_AMBULATORY_CARE_PROVIDER_SITE_OTHER): Payer: Medicare Other | Admitting: Family Medicine

## 2015-05-15 VITALS — BP 166/82 | HR 84 | Temp 98.6°F | Resp 16 | Wt 196.0 lb

## 2015-05-15 DIAGNOSIS — E119 Type 2 diabetes mellitus without complications: Secondary | ICD-10-CM

## 2015-05-15 DIAGNOSIS — E7800 Pure hypercholesterolemia, unspecified: Secondary | ICD-10-CM | POA: Diagnosis not present

## 2015-05-15 DIAGNOSIS — E669 Obesity, unspecified: Secondary | ICD-10-CM | POA: Diagnosis not present

## 2015-05-15 DIAGNOSIS — F419 Anxiety disorder, unspecified: Secondary | ICD-10-CM | POA: Diagnosis not present

## 2015-05-15 DIAGNOSIS — I340 Nonrheumatic mitral (valve) insufficiency: Secondary | ICD-10-CM

## 2015-05-15 DIAGNOSIS — I10 Essential (primary) hypertension: Secondary | ICD-10-CM | POA: Diagnosis not present

## 2015-05-15 DIAGNOSIS — I071 Rheumatic tricuspid insufficiency: Secondary | ICD-10-CM

## 2015-05-15 MED ORDER — ALPRAZOLAM 0.5 MG PO TABS: 0.5000 mg | ORAL_TABLET | Freq: Every evening | ORAL | Status: DC | PRN

## 2015-05-15 NOTE — Progress Notes (Signed)
Patient ID: Hannah Faulkner, female   DOB: Aug 10, 1940, 75 y.o.   MRN: [id number]    Subjective:  HPI  Patient is here for follow up.  Hypertension: Patient had elevated B/p the last 2 times she has come in. Patient states that after her last visit in December on the 22nd she went into atrial fib for 9 days. Dr. Lorinda Creed followed her for this and that time advised her to take 4 tablets of Metoprolol. Patient states she checked her B/p during that time but is not sure what the readings have been and whether they were normal or not. She has followed up with Dr. Lorinda Creed after getting back in regular rhythm. She is taking Metoprolol 50 mg now. BP Readings from Last 3 Encounters:  05/15/15 166/82  03/12/15 158/86  02/12/15 162/58   Patient also needs a refill on Xanax. She takes 1/2 tablet at bedtime.  Prior to Admission medications   Medication Sig Start Date End Date Taking? Authorizing Provider  ALPRAZolam Duanne Moron) 0.5 MG tablet Take by mouth. 01/05/14  Yes Historical Provider, MD  cyanocobalamin 100 MCG tablet Take by mouth. 02/24/11  Yes Historical Provider, MD  flecainide (TAMBOCOR) 50 MG tablet Take by mouth. 11/16/13  Yes Historical Provider, MD  gabapentin (NEURONTIN) 100 MG capsule TAKE ONE CAPSULE BY MOUTH 3 TIMES A DAY 03/12/15  Yes Birdie Sons, MD  hydrALAZINE (APRESOLINE) 25 MG tablet TAKE 1 TABLET BY MOUTH 3 TIMES A DAY Patient taking differently: TAKE 1 TABLET BY MOUTH 2 TIMES A DAY 03/28/15  Yes Undray Allman Maceo Pro., MD  metoprolol (LOPRESSOR) 50 MG tablet Take by mouth. 11/16/13  Yes Historical Provider, MD  potassium chloride (K-DUR) 10 MEQ tablet Take by mouth. 11/16/13  Yes Historical Provider, MD  PROAIR HFA 108 (90 Base) MCG/ACT inhaler INHALE 2 INHALATIONS INTO THE LUNGS EVERY 6 (SIX) HOURS AS NEEDED FOR WHEEZING. 04/04/15  Yes Historical Provider, MD  Pyridoxine HCl (VITAMIN B6) 200 MG TABS Take by mouth. 11/16/13  Yes Historical Provider, MD    triamterene-hydrochlorothiazide (MAXZIDE-25) 37.5-25 MG tablet TAKE 1 TABLET BY MOUTH EVERY DAY 02/26/15  Yes Jerrol Banana., MD  warfarin (COUMADIN) 2.5 MG tablet Take by mouth. 11/16/13  Yes Historical Provider, MD    Patient Active Problem List   Diagnosis Date Noted  . Anxiety 08/25/2014  . A-fib ([place]) 08/25/2014  . Benign neoplasm of colon 08/25/2014  . 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 Diabetic neuropathy ([place]) 08/25/2014  . Essential (primary) hypertension 08/25/2014  . 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 Adiposity 08/25/2014  . Obstructive apnea 08/25/2014  . Hypercholesterolemia without hypertriglyceridemia 08/25/2014  . B12 deficiency 08/25/2014  . H/O cardiac catheterization 07/15/2013  . BP (high blood pressure) 07/15/2013  . Apnea, sleep 07/15/2013  . AF (paroxysmal atrial fibrillation) ([place]) 07/15/2013    No past medical history on file.  Social History   Social History  . Marital Status: Widowed    Spouse Name: N/A  . Number of Children: N/A  . Years of Education: N/A   Occupational History  . Not on file.   Social History Main Topics  . Smoking status: Never Smoker   . Smokeless tobacco: Not on file  . Alcohol Use: No  . Drug Use: No  . Sexual Activity: Not on file   Other Topics Concern  . Not on file   Social History Narrative    Allergies  Allergen  Reactions  . Ampicillin   . Penicillins   . Prinzide  [Lisinopril-Hydrochlorothiazide]     Review of Systems  Respiratory: Negative.   Cardiovascular: Negative.   Musculoskeletal: Positive for joint pain.  Neurological: Positive for weakness (better).  Psychiatric/Behavioral: The patient is nervous/anxious and has insomnia.     Immunization History  Administered Date(s) Administered  . Influenza, High Dose Seasonal PF 01/17/2015  . Pneumococcal Conjugate-13 02/08/2014  . Pneumococcal Polysaccharide-23  01/30/2002, 01/21/2012  . Td 10/17/2009  . Tdap 01/21/2012  . Zoster 02/21/2013   Objective:  BP 166/82 mmHg  Pulse 84  Temp(Src) 98.6 F (37 C)  Resp 16  Wt 196 lb (88.905 kg)  Physical Exam  Constitutional: She is oriented to person, place, and time and well-developed, well-nourished, and in no distress.  HENT:  Head: Normocephalic.  Eyes: Conjunctivae are normal. Pupils are equal, round, and reactive to light.  Neck: Normal range of motion. Neck supple.  Cardiovascular: Normal rate, regular rhythm, normal heart sounds and intact distal pulses.   No murmur heard. Pulmonary/Chest: Effort normal and breath sounds normal. No respiratory distress. She has no wheezes.  Musculoskeletal: She exhibits no edema or tenderness.  Neurological: She is alert and oriented to person, place, and time.  Psychiatric: Mood, memory, affect and judgment normal.    Lab Results  Component Value Date   WBC CANCELED 02/22/2015   HGB 13.3 09/13/2013   HCT CANCELED 02/22/2015   PLT CANCELED 02/22/2015   GLUCOSE 103* 02/22/2015   CHOL 164 02/22/2015   TRIG 112 02/22/2015   HDL 49 02/22/2015   LDLCALC 93 02/22/2015   TSH 1.390 02/22/2015   HGBA1C 6.0* 02/22/2015    CMP     Component Value Date/Time   NA 142 02/22/2015 0917   K 4.0 02/22/2015 0917   CL 102 02/22/2015 0917   CO2 26 02/22/2015 0917   GLUCOSE 103* 02/22/2015 0917   BUN 14 02/22/2015 0917   CREATININE 0.73 02/22/2015 0917   CREATININE 0.7 09/13/2013   CALCIUM 9.0 02/22/2015 0917   PROT 6.7 02/22/2015 0917   ALBUMIN 4.1 02/22/2015 0917   AST 21 02/22/2015 0917   ALT 17 02/22/2015 0917   ALKPHOS 64 02/22/2015 0917   BILITOT 0.6 02/22/2015 0917   GFRNONAA 81 02/22/2015 0917   GFRAA 94 02/22/2015 0917    Assessment and Plan :  1. Essential hypertension Still elevated today. When patient seen Dr. Lorinda Creed in his notes B/P more stable. Patient doe not want to make any changes to her B/P at this time.She is advised to check  blood pressure home and bring in readings to the office.  2. Type 2 diabetes mellitus without complication, without long-term current use of insulin ([place]) Too soon to check A1C today.  3. Hypercholesterolemia without hypertriglyceridemia Stable on the last check in December.  4. Adiposity  5. Tricuspid regurgitation Per recent echo done in January 2017 6. Mitral valve regurgitation Per recent echo done in January 2017  7. Anxiety Refill on xanax given.Patient has been advised to limit use as much as possible due to fall risk   I have done the exam and reviewed the above chart and it is accurate to the best of my knowledge.  Patient was seen and examined by Dr. Eulas Post and note was scribed by Hannah Faulkner, RMA.    Miguel Aschoff MD [facility] 05/15/2015 1:57 PM

## 2015-05-17 DIAGNOSIS — I482 Chronic atrial fibrillation: Secondary | ICD-10-CM | POA: Diagnosis not present

## 2015-06-14 DIAGNOSIS — I482 Chronic atrial fibrillation: Secondary | ICD-10-CM | POA: Diagnosis not present

## 2015-06-22 DIAGNOSIS — E7800 Pure hypercholesterolemia, unspecified: Secondary | ICD-10-CM | POA: Diagnosis not present

## 2015-06-22 DIAGNOSIS — G4733 Obstructive sleep apnea (adult) (pediatric): Secondary | ICD-10-CM | POA: Diagnosis not present

## 2015-06-22 DIAGNOSIS — Z9889 Other specified postprocedural states: Secondary | ICD-10-CM | POA: Diagnosis not present

## 2015-06-22 DIAGNOSIS — I480 Paroxysmal atrial fibrillation: Secondary | ICD-10-CM | POA: Diagnosis not present

## 2015-06-22 DIAGNOSIS — I10 Essential (primary) hypertension: Secondary | ICD-10-CM | POA: Diagnosis not present

## 2015-07-03 ENCOUNTER — Other Ambulatory Visit: Payer: Self-pay | Admitting: Family Medicine

## 2015-07-12 DIAGNOSIS — I482 Chronic atrial fibrillation: Secondary | ICD-10-CM | POA: Diagnosis not present

## 2015-07-27 ENCOUNTER — Ambulatory Visit: Payer: Self-pay | Admitting: Family Medicine

## 2015-07-27 DIAGNOSIS — M545 Low back pain: Secondary | ICD-10-CM | POA: Diagnosis not present

## 2015-07-29 ENCOUNTER — Other Ambulatory Visit: Payer: Self-pay | Admitting: Family Medicine

## 2015-07-30 ENCOUNTER — Other Ambulatory Visit: Payer: Self-pay | Admitting: Family Medicine

## 2015-08-09 DIAGNOSIS — I482 Chronic atrial fibrillation: Secondary | ICD-10-CM | POA: Diagnosis not present

## 2015-09-06 DIAGNOSIS — I480 Paroxysmal atrial fibrillation: Secondary | ICD-10-CM | POA: Diagnosis not present

## 2015-10-15 ENCOUNTER — Other Ambulatory Visit: Payer: Self-pay | Admitting: Family Medicine

## 2015-10-15 DIAGNOSIS — I480 Paroxysmal atrial fibrillation: Secondary | ICD-10-CM | POA: Diagnosis not present

## 2015-11-12 ENCOUNTER — Ambulatory Visit (INDEPENDENT_AMBULATORY_CARE_PROVIDER_SITE_OTHER): Payer: Medicare Other | Admitting: Family Medicine

## 2015-11-12 ENCOUNTER — Encounter: Payer: Self-pay | Admitting: Family Medicine

## 2015-11-12 VITALS — BP 142/68 | HR 76 | Temp 98.9°F | Resp 16 | Wt 196.0 lb

## 2015-11-12 DIAGNOSIS — E7800 Pure hypercholesterolemia, unspecified: Secondary | ICD-10-CM | POA: Diagnosis not present

## 2015-11-12 DIAGNOSIS — E119 Type 2 diabetes mellitus without complications: Secondary | ICD-10-CM

## 2015-11-12 DIAGNOSIS — I10 Essential (primary) hypertension: Secondary | ICD-10-CM | POA: Diagnosis not present

## 2015-11-12 DIAGNOSIS — I482 Chronic atrial fibrillation: Secondary | ICD-10-CM | POA: Diagnosis not present

## 2015-11-12 LAB — POCT UA - MICROALBUMIN: MICROALBUMIN (UR) POC: 50 mg/L

## 2015-11-12 NOTE — Patient Instructions (Signed)
Start Magnesium Oxide 400 mg to help with muscle cramping in legs.

## 2015-11-12 NOTE — Progress Notes (Signed)
Patient: Hannah Faulkner Female    DOB: [birth year]   75 y.o.   MRN: [id number] Visit Date: 11/12/2015  Today's Provider: Wilhemena Durie, MD   Chief Complaint  Patient presents with  . Hypertension  . Diabetes   Subjective:    HPI Patient comes in today for a follow up on hypertension. Patient was seen on 05/15/2015 and no changes were made. She reports that she checks her BP daily and it averages between 140s/70s. Patient denies any chest pain, headaches, or shortness of breath. Patient reports that she does occasionally have palpitations, but it is due to her A-fib. Patient currently takes Coumadin for this, and it is monitored by her cardiologist.     Allergies  Allergen Reactions  . Ampicillin   . Penicillins   . Prinzide  [Lisinopril-Hydrochlorothiazide]    Current Meds  Medication Sig  . ALPRAZolam (XANAX) 0.5 MG tablet Take 1 tablet (0.5 mg total) by mouth at bedtime as needed for anxiety.  . cyanocobalamin 100 MCG tablet Take by mouth.  . flecainide (TAMBOCOR) 50 MG tablet Take by mouth.  . gabapentin (NEURONTIN) 100 MG capsule TAKE ONE CAPSULE BY MOUTH 3 TIMES A DAY  . hydrALAZINE (APRESOLINE) 25 MG tablet TAKE 1 TABLET BY MOUTH 3 TIMES A DAY (Patient taking differently: TAKE 1 TABLET BY MOUTH 2 TIMES A DAY)  . metoprolol (LOPRESSOR) 50 MG tablet Take by mouth.  . potassium chloride (K-DUR) 10 MEQ tablet Take by mouth.  Marland Kitchen PROAIR HFA 108 (90 Base) MCG/ACT inhaler INHALE 2 INHALATIONS INTO THE LUNGS EVERY 6 (SIX) HOURS AS NEEDED FOR WHEEZING.  Marland Kitchen Pyridoxine HCl (VITAMIN B6) 200 MG TABS Take by mouth.  . triamterene-hydrochlorothiazide (MAXZIDE-25) 37.5-25 MG tablet TAKE 1 TABLET BY MOUTH EVERY DAY  . triamterene-hydrochlorothiazide (MAXZIDE-25) 37.5-25 MG tablet TAKE 1 TABLET BY MOUTH EVERY DAY  . warfarin (COUMADIN) 2.5 MG tablet Take by mouth.    Review of Systems  Constitutional: Negative.   Eyes: Negative.   Respiratory: Negative.   Cardiovascular:  Positive for palpitations.       Occasionally. Has A-fib.   Gastrointestinal: Negative.   Endocrine: Negative.   Musculoskeletal: Positive for myalgias.  Skin: Negative.   Allergic/Immunologic: Negative.   Neurological: Negative.   Hematological: Negative.   Psychiatric/Behavioral: Negative.     Social History  Substance Use Topics  . Smoking status: Never Smoker  . Smokeless tobacco: Not on file  . Alcohol use No   Objective:   BP (!) 142/68 (BP Location: Right Arm, Patient Position: Sitting, Cuff Size: Normal)   Pulse 76   Temp 98.9 F (37.2 C)   Resp 16   Wt 196 lb (88.9 kg)   BMI 38.60 kg/m   Physical Exam  Constitutional: She is oriented to person, place, and time. She appears well-developed and well-nourished.  HENT:  Head: Normocephalic and atraumatic.  Eyes: Conjunctivae are normal. No scleral icterus.  Neck: Normal range of motion. Neck supple. No thyromegaly present.  Cardiovascular: Normal rate and regular rhythm.   Murmur heard. 2/6 systolic murmur on the right.   Pulmonary/Chest: Effort normal and breath sounds normal.  Abdominal: Soft.  Musculoskeletal: She exhibits edema.  +1 edema in lower extremities bilaterally.   Lymphadenopathy:    She has no cervical adenopathy.  Neurological: She is alert and oriented to person, place, and time.  Skin: Skin is warm and dry.  Psychiatric: She has a normal mood and affect. Her behavior is normal.  Judgment and thought content normal.        Assessment & Plan:     1. Essential (primary) hypertension  - Comprehensive metabolic panel - TSH  2. Type 2 diabetes mellitus without complication, without long-term current use of insulin (HCC)  - CBC with Differential/Platelet - Hemoglobin A1c  3. Hypercholesterolemia without hypertriglyceridemia  - Lipid panel I have done the exam and reviewed the above chart and it is accurate to the best of my knowledge.        Richard Cranford Mon, MD  [facility]

## 2015-11-27 DIAGNOSIS — I10 Essential (primary) hypertension: Secondary | ICD-10-CM | POA: Diagnosis not present

## 2015-11-27 DIAGNOSIS — E7800 Pure hypercholesterolemia, unspecified: Secondary | ICD-10-CM | POA: Diagnosis not present

## 2015-11-27 DIAGNOSIS — E119 Type 2 diabetes mellitus without complications: Secondary | ICD-10-CM | POA: Diagnosis not present

## 2015-11-28 ENCOUNTER — Telehealth: Payer: Self-pay

## 2015-11-28 LAB — CBC WITH DIFFERENTIAL/PLATELET
Basophils Absolute: 0 10*3/uL (ref 0.0–0.2)
Basos: 0 %
EOS (ABSOLUTE): 0.1 10*3/uL (ref 0.0–0.4)
Eos: 1 %
Hematocrit: 36.2 % (ref 34.0–46.6)
Hemoglobin: 12.2 g/dL (ref 11.1–15.9)
Immature Grans (Abs): 0 10*3/uL (ref 0.0–0.1)
Immature Granulocytes: 0 %
LYMPHS: 29 %
Lymphocytes Absolute: 2.2 10*3/uL (ref 0.7–3.1)
MCH: 28.4 pg (ref 26.6–33.0)
MCHC: 33.7 g/dL (ref 31.5–35.7)
MCV: 84 fL (ref 79–97)
MONOCYTES: 8 %
Monocytes Absolute: 0.6 10*3/uL (ref 0.1–0.9)
Neutrophils Absolute: 4.8 10*3/uL (ref 1.4–7.0)
Neutrophils: 62 %
Platelets: 191 10*3/uL (ref 150–379)
RBC: 4.29 x10E6/uL (ref 3.77–5.28)
RDW: 14.8 % (ref 12.3–15.4)
WBC: 7.7 10*3/uL (ref 3.4–10.8)

## 2015-11-28 LAB — COMPREHENSIVE METABOLIC PANEL
ALBUMIN: 4 g/dL (ref 3.5–4.8)
ALT: 17 IU/L (ref 0–32)
AST: 20 IU/L (ref 0–40)
Albumin/Globulin Ratio: 1.4 (ref 1.2–2.2)
Alkaline Phosphatase: 62 IU/L (ref 39–117)
BILIRUBIN TOTAL: 0.8 mg/dL (ref 0.0–1.2)
BUN / CREAT RATIO: 23 (ref 12–28)
BUN: 16 mg/dL (ref 8–27)
CHLORIDE: 102 mmol/L (ref 96–106)
CO2: 25 mmol/L (ref 18–29)
Calcium: 8.8 mg/dL (ref 8.7–10.3)
Creatinine, Ser: 0.7 mg/dL (ref 0.57–1.00)
GFR calc non Af Amer: 85 mL/min/{1.73_m2} (ref >59)
GFR, EST AFRICAN AMERICAN: 98 mL/min/{1.73_m2} (ref >59)
Globulin, Total: 2.8 g/dL (ref 1.5–4.5)
Glucose: 102 mg/dL — ABNORMAL HIGH (ref 65–99)
Potassium: 3.6 mmol/L (ref 3.5–5.2)
Sodium: 143 mmol/L (ref 134–144)
Total Protein: 6.8 g/dL (ref 6.0–8.5)

## 2015-11-28 LAB — TSH: TSH: 1.35 u[IU]/mL (ref 0.450–4.500)

## 2015-11-28 LAB — LIPID PANEL
CHOLESTEROL TOTAL: 148 mg/dL (ref 100–199)
Chol/HDL Ratio: 3.5 ratio units (ref 0.0–4.4)
HDL: 42 mg/dL (ref >39)
LDL Calculated: 84 mg/dL (ref 0–99)
Triglycerides: 109 mg/dL (ref 0–149)
VLDL Cholesterol Cal: 22 mg/dL (ref 5–40)

## 2015-11-28 LAB — HEMOGLOBIN A1C
Est. average glucose Bld gHb Est-mCnc: 117 mg/dL
Hgb A1c MFr Bld: 5.7 % — ABNORMAL HIGH (ref 4.8–5.6)

## 2015-11-28 NOTE — Telephone Encounter (Signed)
Advised pt of lab results. Pt verbally acknowledges understanding. Emily Drozdowski, CMA   

## 2015-11-28 NOTE — Telephone Encounter (Signed)
-----   Message from Jerrol Banana., MD sent at 11/28/2015 11:19 AM EDT ----- Labs stable.

## 2015-12-12 DIAGNOSIS — H2513 Age-related nuclear cataract, bilateral: Secondary | ICD-10-CM | POA: Diagnosis not present

## 2015-12-17 DIAGNOSIS — E7800 Pure hypercholesterolemia, unspecified: Secondary | ICD-10-CM | POA: Diagnosis not present

## 2015-12-17 DIAGNOSIS — I482 Chronic atrial fibrillation: Secondary | ICD-10-CM | POA: Diagnosis not present

## 2015-12-17 DIAGNOSIS — I480 Paroxysmal atrial fibrillation: Secondary | ICD-10-CM | POA: Diagnosis not present

## 2015-12-17 DIAGNOSIS — E1140 Type 2 diabetes mellitus with diabetic neuropathy, unspecified: Secondary | ICD-10-CM | POA: Diagnosis not present

## 2015-12-17 DIAGNOSIS — I10 Essential (primary) hypertension: Secondary | ICD-10-CM | POA: Diagnosis not present

## 2015-12-17 DIAGNOSIS — G4733 Obstructive sleep apnea (adult) (pediatric): Secondary | ICD-10-CM | POA: Diagnosis not present

## 2015-12-31 ENCOUNTER — Other Ambulatory Visit: Payer: Self-pay | Admitting: Family Medicine

## 2016-01-08 ENCOUNTER — Other Ambulatory Visit: Payer: Self-pay | Admitting: Family Medicine

## 2016-01-10 ENCOUNTER — Ambulatory Visit (INDEPENDENT_AMBULATORY_CARE_PROVIDER_SITE_OTHER): Payer: Medicare Other

## 2016-01-10 DIAGNOSIS — Z23 Encounter for immunization: Secondary | ICD-10-CM | POA: Diagnosis not present

## 2016-01-17 DIAGNOSIS — H2512 Age-related nuclear cataract, left eye: Secondary | ICD-10-CM | POA: Diagnosis not present

## 2016-01-17 DIAGNOSIS — H40013 Open angle with borderline findings, low risk, bilateral: Secondary | ICD-10-CM | POA: Diagnosis not present

## 2016-01-17 DIAGNOSIS — H2513 Age-related nuclear cataract, bilateral: Secondary | ICD-10-CM | POA: Diagnosis not present

## 2016-01-17 DIAGNOSIS — H25012 Cortical age-related cataract, left eye: Secondary | ICD-10-CM | POA: Diagnosis not present

## 2016-01-17 DIAGNOSIS — H25013 Cortical age-related cataract, bilateral: Secondary | ICD-10-CM | POA: Diagnosis not present

## 2016-01-30 ENCOUNTER — Ambulatory Visit (INDEPENDENT_AMBULATORY_CARE_PROVIDER_SITE_OTHER): Payer: Medicare Other | Admitting: Family Medicine

## 2016-01-30 ENCOUNTER — Other Ambulatory Visit: Payer: Self-pay

## 2016-01-30 ENCOUNTER — Telehealth: Payer: Self-pay

## 2016-01-30 VITALS — BP 184/96 | HR 96 | Temp 98.9°F | Resp 18 | Wt 196.0 lb

## 2016-01-30 DIAGNOSIS — B9789 Other viral agents as the cause of diseases classified elsewhere: Secondary | ICD-10-CM

## 2016-01-30 DIAGNOSIS — R059 Cough, unspecified: Secondary | ICD-10-CM

## 2016-01-30 DIAGNOSIS — J069 Acute upper respiratory infection, unspecified: Secondary | ICD-10-CM

## 2016-01-30 DIAGNOSIS — R05 Cough: Secondary | ICD-10-CM

## 2016-01-30 MED ORDER — AZITHROMYCIN 250 MG PO TABS: ORAL_TABLET | ORAL | 0 refills | Status: DC

## 2016-01-30 MED ORDER — DOXYCYCLINE HYCLATE 100 MG PO TABS: 100.0000 mg | ORAL_TABLET | Freq: Two times a day (BID) | ORAL | 0 refills | Status: DC

## 2016-01-30 MED ORDER — HYDRALAZINE HCL 25 MG PO TABS: 25.0000 mg | ORAL_TABLET | Freq: Two times a day (BID) | ORAL | 2 refills | Status: DC

## 2016-01-30 MED ORDER — GABAPENTIN 100 MG PO CAPS: 100.0000 mg | ORAL_CAPSULE | Freq: Three times a day (TID) | ORAL | 2 refills | Status: DC

## 2016-01-30 NOTE — Telephone Encounter (Signed)
Melanie advised=aa

## 2016-01-30 NOTE — Telephone Encounter (Signed)
yes

## 2016-01-30 NOTE — Progress Notes (Signed)
Hannah Faulkner  MRN: [id number] DOB: [birth year]  Subjective:  HPI  Patient started to feel bad on Sunday 01/27/16. Symptoms are cough mainly dry, congestion, post nasal drainage, chills. Temp has been around 99.3. No body aches. She has been taking Tylenol.  Patient Active Problem List   Diagnosis Date Noted  . Tricuspid regurgitation 05/15/2015  . Mitral valve regurgitation 05/15/2015  . Anxiety 08/25/2014  . A-fib ([place]) 08/25/2014  . Benign neoplasm of colon 08/25/2014  . 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 Diabetic neuropathy ([place]) 08/25/2014  . Essential (primary) hypertension 08/25/2014  . 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 Adiposity 08/25/2014  . Obstructive apnea 08/25/2014  . Hypercholesterolemia without hypertriglyceridemia 08/25/2014  . B12 deficiency 08/25/2014  . Type 2 diabetes mellitus ([place]) 08/25/2014  . H/O cardiac catheterization 07/15/2013  . BP (high blood pressure) 07/15/2013  . Apnea, sleep 07/15/2013  . AF (paroxysmal atrial fibrillation) ([place]) 07/15/2013    No past medical history on file.  Social History   Social History  . Marital status: Widowed    Spouse name: N/A  . Number of children: N/A  . Years of education: N/A   Occupational History  . Not on file.   Social History Main Topics  . Smoking status: Never Smoker  . Smokeless tobacco: Not on file  . Alcohol use No  . Drug use: No  . Sexual activity: Not on file   Other Topics Concern  . Not on file   Social History Narrative  . No narrative on file    Outpatient Encounter Prescriptions as of 01/30/2016  Medication Sig Note  . ALPRAZolam (XANAX) 0.5 MG tablet TAKE 1 TABLET BY MOUTH AT BEDTIME AS NEEDED FOR ANXIETY   . cyanocobalamin 100 MCG tablet Take by mouth. 10/11/2014: Received from: [facility]  . flecainide (TAMBOCOR) 50 MG tablet Take by mouth. 08/25/2014: Received from: [facility]  . gabapentin (NEURONTIN) 100 MG capsule Take 1 capsule (100 mg total) by mouth 3 (three) times daily.   . hydrALAZINE (APRESOLINE) 25 MG tablet Take 1 tablet (25 mg total) by mouth 2 (two) times daily.   . metoprolol (LOPRESSOR) 50 MG tablet Take by mouth. 08/25/2014: Received from: [facility]  . potassium chloride (K-DUR) 10 MEQ tablet Take by mouth. 08/25/2014: Received from: [facility]  . PROAIR HFA 108 (90 Base) MCG/ACT inhaler INHALE 2 INHALATIONS INTO THE LUNGS EVERY 6 (SIX) HOURS AS NEEDED FOR WHEEZING. 05/15/2015: Received from: External Pharmacy  . Pyridoxine HCl (VITAMIN B6) 200 MG TABS Take by mouth. 08/25/2014: Received from: [facility]  . triamterene-hydrochlorothiazide (MAXZIDE-25) 37.5-25 MG tablet TAKE 1 TABLET BY MOUTH EVERY DAY   . warfarin (COUMADIN) 2.5 MG tablet Take by mouth. 08/25/2014: Received from: [facility]  . [DISCONTINUED] triamterene-hydrochlorothiazide (MAXZIDE-25) 37.5-25 MG tablet TAKE 1 TABLET BY MOUTH EVERY DAY    No facility-administered encounter medications on file as of 01/30/2016.     Allergies  Allergen Reactions  . Ampicillin   . Penicillins   . Prinzide  [Lisinopril-Hydrochlorothiazide]     Review of Systems  Constitutional: Positive for chills and malaise/fatigue.  HENT: Positive for congestion and sore throat.   Respiratory: Positive for cough, sputum production and shortness of breath.   Cardiovascular: Negative.   Musculoskeletal: Negative.   Neurological: Negative.     Objective:  BP (!) 184/96   Pulse 96   Temp 98.9  F (37.2 C)   Resp 18   Wt 196 lb (88.9 kg)   SpO2 97%   BMI 38.60 kg/m   Physical Exam  Constitutional: She is oriented to person, place, and time. Vital signs are normal. She has a sickly appearance.  HENT:  Head: Normocephalic and atraumatic.  Right Ear: External ear normal.  Left Ear: External ear normal.  Eyes:  Conjunctivae are normal. Pupils are equal, round, and reactive to light.  Neck: Normal range of motion. Neck supple.  Cardiovascular: Normal rate, regular rhythm, normal heart sounds and intact distal pulses.   No murmur heard. Pulmonary/Chest: Effort normal and breath sounds normal. No respiratory distress. She has no wheezes.  Neurological: She is alert and oriented to person, place, and time.    Assessment and Plan :  1. Cough Advised patient to take Robitussin DM, push fluids.  2. Viral upper respiratory tract infection Treat with zPAK.  HPI, Exam and A&P transcribed under direction and in the presence of Miguel Aschoff, MD. I have done the exam and reviewed the chart and it is accurate to the best of my knowledge. Development worker, community has been used and  any errors in dictation or transcription are unintentional. Miguel Aschoff M.D. [facility]

## 2016-01-30 NOTE — Patient Instructions (Signed)
Take Robitussin DM over the counter.

## 2016-01-30 NOTE — Telephone Encounter (Signed)
Melanie from [facility] is calling because patient was prescribed Azithromycin and was also prescribed Flucinonide by Dr. Josefa Half . Pharmacist stats these medications interact and patient reports when she was on both of these medications before in the past she went into A-fib. Pharmacist wants to know if you want to change prescription to Doxycycline? KW

## 2016-02-19 DIAGNOSIS — H25012 Cortical age-related cataract, left eye: Secondary | ICD-10-CM | POA: Diagnosis not present

## 2016-02-19 DIAGNOSIS — H2512 Age-related nuclear cataract, left eye: Secondary | ICD-10-CM | POA: Diagnosis not present

## 2016-02-19 DIAGNOSIS — H25812 Combined forms of age-related cataract, left eye: Secondary | ICD-10-CM | POA: Diagnosis not present

## 2016-02-25 ENCOUNTER — Other Ambulatory Visit: Payer: Self-pay | Admitting: Family Medicine

## 2016-02-25 NOTE — Telephone Encounter (Signed)
Please review-aa 

## 2016-02-25 NOTE — Telephone Encounter (Signed)
Pt contacted office for refill request on the following medications: doxycycline (VIBRA-TABS) 100 MG tablet To [facility] [address]. Last Rx: 01/30/16 Last OV: 01/30/16 Pt stated that she took the doxycycline (VIBRA-TABS) 100 MG tablet as directed and felt much better but Saturday 02/23/16 she stated her sore throat, cough, and congestion came back. Pt stated she is taking robitussin dm OTC and she thinks she needs an antibiotic. Pt request a call back to let her know if this is going to be [facility] to [facility]. Please advise. Thanks TNP

## 2016-02-26 ENCOUNTER — Ambulatory Visit
Admission: RE | Admit: 2016-02-26 | Discharge: 2016-02-26 | Disposition: A | Discharge status: Home / Self Care | Payer: Medicare Other | Source: Ambulatory Visit | Attending: Family Medicine | Admitting: Family Medicine

## 2016-02-26 ENCOUNTER — Encounter: Payer: Self-pay | Admitting: Family Medicine

## 2016-02-26 ENCOUNTER — Ambulatory Visit (INDEPENDENT_AMBULATORY_CARE_PROVIDER_SITE_OTHER): Payer: Medicare Other | Admitting: Family Medicine

## 2016-02-26 VITALS — BP 162/74 | HR 78 | Temp 100.2°F | Resp 20 | Wt 197.0 lb

## 2016-02-26 DIAGNOSIS — J189 Pneumonia, unspecified organism: Secondary | ICD-10-CM | POA: Diagnosis not present

## 2016-02-26 DIAGNOSIS — Z833 Family history of diabetes mellitus: Secondary | ICD-10-CM | POA: Diagnosis not present

## 2016-02-26 DIAGNOSIS — J45909 Unspecified asthma, uncomplicated: Secondary | ICD-10-CM

## 2016-02-26 DIAGNOSIS — I482 Chronic atrial fibrillation: Secondary | ICD-10-CM | POA: Diagnosis not present

## 2016-02-26 DIAGNOSIS — E1140 Type 2 diabetes mellitus with diabetic neuropathy, unspecified: Secondary | ICD-10-CM | POA: Diagnosis not present

## 2016-02-26 DIAGNOSIS — I517 Cardiomegaly: Secondary | ICD-10-CM

## 2016-02-26 DIAGNOSIS — Z8249 Family history of ischemic heart disease and other diseases of the circulatory system: Secondary | ICD-10-CM | POA: Diagnosis not present

## 2016-02-26 DIAGNOSIS — J9801 Acute bronchospasm: Secondary | ICD-10-CM | POA: Diagnosis not present

## 2016-02-26 DIAGNOSIS — Z7951 Long term (current) use of inhaled steroids: Secondary | ICD-10-CM | POA: Diagnosis not present

## 2016-02-26 DIAGNOSIS — R05 Cough: Secondary | ICD-10-CM | POA: Diagnosis not present

## 2016-02-26 DIAGNOSIS — Z79899 Other long term (current) drug therapy: Secondary | ICD-10-CM | POA: Diagnosis not present

## 2016-02-26 DIAGNOSIS — Z880 Allergy status to penicillin: Secondary | ICD-10-CM | POA: Diagnosis not present

## 2016-02-26 DIAGNOSIS — J9601 Acute respiratory failure with hypoxia: Secondary | ICD-10-CM | POA: Diagnosis not present

## 2016-02-26 DIAGNOSIS — Z7901 Long term (current) use of anticoagulants: Secondary | ICD-10-CM | POA: Diagnosis not present

## 2016-02-26 DIAGNOSIS — E876 Hypokalemia: Secondary | ICD-10-CM | POA: Diagnosis not present

## 2016-02-26 DIAGNOSIS — Z823 Family history of stroke: Secondary | ICD-10-CM | POA: Diagnosis not present

## 2016-02-26 DIAGNOSIS — I10 Essential (primary) hypertension: Secondary | ICD-10-CM | POA: Diagnosis not present

## 2016-02-26 DIAGNOSIS — Z888 Allergy status to other drugs, medicaments and biological substances status: Secondary | ICD-10-CM | POA: Diagnosis not present

## 2016-02-26 DIAGNOSIS — R0602 Shortness of breath: Secondary | ICD-10-CM | POA: Diagnosis not present

## 2016-02-26 DIAGNOSIS — R059 Cough, unspecified: Secondary | ICD-10-CM

## 2016-02-26 DIAGNOSIS — R509 Fever, unspecified: Secondary | ICD-10-CM

## 2016-02-26 LAB — POC INFLUENZA A&B (BINAX/QUICKVUE)
Influenza A, POC: NEGATIVE
Influenza B, POC: NEGATIVE

## 2016-02-26 MED ORDER — DOXYCYCLINE HYCLATE 100 MG PO TABS: 100.0000 mg | ORAL_TABLET | Freq: Two times a day (BID) | ORAL | 0 refills | Status: DC

## 2016-02-26 NOTE — Progress Notes (Signed)
Subjective:  HPI Pt is her for fever and URI symptoms. She reports that her symptoms started 3 days ago. She reports that she started running a fever yesterday evening of about 100.3 (also has one in office today). She's coughing up yellow sputum with blood tinge, SOB and chills. Denies body aches, sinus congestion or headache. She reports that she has a bad taste in her mouth and has been nauseated from time to time. She did have a flu vaccine.   Prior to Admission medications   Medication Sig Start Date End Date Taking? Authorizing Provider  ALPRAZolam Duanne Moron) 0.5 MG tablet TAKE 1 TABLET BY MOUTH AT BEDTIME AS NEEDED FOR ANXIETY 01/08/16  Yes Amazing Cowman Maceo Pro., MD  cyanocobalamin 100 MCG tablet Take by mouth. 02/24/11  Yes Historical Provider, MD  flecainide (TAMBOCOR) 50 MG tablet Take by mouth. 11/16/13  Yes Historical Provider, MD  gabapentin (NEURONTIN) 100 MG capsule Take 1 capsule (100 mg total) by mouth 3 (three) times daily. 01/30/16  Yes Enyah Moman Maceo Pro., MD  hydrALAZINE (APRESOLINE) 25 MG tablet Take 1 tablet (25 mg total) by mouth 2 (two) times daily. 01/30/16  Yes Opha Mcghee Maceo Pro., MD  metoprolol (LOPRESSOR) 50 MG tablet Take by mouth. 11/16/13  Yes Historical Provider, MD  potassium chloride (K-DUR) 10 MEQ tablet Take by mouth. 11/16/13  Yes Historical Provider, MD  PROAIR HFA 108 (90 Base) MCG/ACT inhaler INHALE 2 INHALATIONS INTO THE LUNGS EVERY 6 (SIX) HOURS AS NEEDED FOR WHEEZING. 04/04/15  Yes Historical Provider, MD  Pyridoxine HCl (VITAMIN B6) 200 MG TABS Take by mouth. 11/16/13  Yes Historical Provider, MD  triamterene-hydrochlorothiazide (MAXZIDE-25) 37.5-25 MG tablet TAKE 1 TABLET BY MOUTH EVERY DAY 07/31/15  Yes Jerrol Banana., MD  warfarin (COUMADIN) 2.5 MG tablet Take by mouth. 11/16/13  Yes Historical Provider, MD    Patient Active Problem List   Diagnosis Date Noted  . Tricuspid regurgitation 05/15/2015  . Mitral valve regurgitation 05/15/2015  .  Anxiety 08/25/2014  . A-fib ([place]) 08/25/2014  . Benign neoplasm of colon 08/25/2014  . 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 Diabetic neuropathy ([place]) 08/25/2014  . Essential (primary) hypertension 08/25/2014  . 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 Adiposity 08/25/2014  . Obstructive apnea 08/25/2014  . Hypercholesterolemia without hypertriglyceridemia 08/25/2014  . B12 deficiency 08/25/2014  . Type 2 diabetes mellitus ([place]) 08/25/2014  . H/O cardiac catheterization 07/15/2013  . BP (high blood pressure) 07/15/2013  . Apnea, sleep 07/15/2013  . AF (paroxysmal atrial fibrillation) ([place]) 07/15/2013    History reviewed. No pertinent past medical history.  Social History   Social History  . Marital status: Widowed    Spouse name: N/A  . Number of children: N/A  . Years of education: N/A   Occupational History  . Not on file.   Social History Main Topics  . Smoking status: Never Smoker  . Smokeless tobacco: Never Used  . Alcohol use No  . Drug use: No  . Sexual activity: Not on file   Other Topics Concern  . Not on file   Social History Narrative  . No narrative on file    Allergies  Allergen Reactions  . Ampicillin   . Penicillins   . Prinzide  [Lisinopril-Hydrochlorothiazide]     Review of Systems  Constitutional: Positive for chills, fever and malaise/fatigue.  HENT: Positive for congestion and sore throat.   Eyes: Negative.  Respiratory: Positive for cough, hemoptysis, sputum production, shortness of breath and wheezing.   Cardiovascular: Negative.   Gastrointestinal: Positive for nausea.  Genitourinary: Negative.   Musculoskeletal: Negative.   Skin: Negative.   Neurological: Negative.   Endo/Heme/Allergies: Negative.   Psychiatric/Behavioral: Negative.     Immunization History  Administered Date(s) Administered  . Influenza, High Dose Seasonal PF 01/17/2015, 01/10/2016  .  Pneumococcal Conjugate-13 02/08/2014  . Pneumococcal Polysaccharide-23 01/30/2002, 01/21/2012  . Td 10/17/2009  . Tdap 01/21/2012  . Zoster 02/21/2013    Objective:  BP (!) 162/74 (BP Location: Left Arm, Patient Position: Sitting, Cuff Size: Large)   Pulse 78   Temp 100.2 F (37.9 C) (Oral)   Resp 20   Wt 197 lb (89.4 kg)   SpO2 93%   BMI 38.80 kg/m   Physical Exam  Constitutional: She is oriented to person, place, and time and well-developed, well-nourished, and in no distress.  WNWDWF NAD. Notoxic appearance.  HENT:  Head: Normocephalic and atraumatic.  Right Ear: External ear normal.  Left Ear: External ear normal.  Nose: Nose normal.  Mouth/Throat: Oropharynx is clear and moist.  Eyes: Conjunctivae are normal. No scleral icterus.  Neck: No thyromegaly present.  Cardiovascular: Normal rate, regular rhythm and normal heart sounds.   Pulmonary/Chest: Effort normal and breath sounds normal. No respiratory distress. She exhibits no tenderness.  Mild crackles both bases. Pt in no distress.  Abdominal: Soft.  Lymphadenopathy:    She has no cervical adenopathy.  Neurological: She is alert and oriented to person, place, and time.  Skin: Skin is warm and dry.  Psychiatric: Mood, memory, affect and judgment normal.    Lab Results  Component Value Date   WBC 7.7 11/27/2015   HGB 13.3 09/13/2013   HCT 36.2 11/27/2015   PLT 191 11/27/2015   GLUCOSE 102 (H) 11/27/2015   CHOL 148 11/27/2015   TRIG 109 11/27/2015   HDL 42 11/27/2015   LDLCALC 84 11/27/2015   TSH 1.350 11/27/2015   HGBA1C 5.7 (H) 11/27/2015   MICROALBUR 50 11/12/2015    CMP     Component Value Date/Time   NA 143 11/27/2015 0953   K 3.6 11/27/2015 0953   CL 102 11/27/2015 0953   CO2 25 11/27/2015 0953   GLUCOSE 102 (H) 11/27/2015 0953   BUN 16 11/27/2015 0953   CREATININE 0.70 11/27/2015 0953   CALCIUM 8.8 11/27/2015 0953   PROT 6.8 11/27/2015 0953   ALBUMIN 4.0 11/27/2015 0953   AST 20 11/27/2015  0953   ALT 17 11/27/2015 0953   ALKPHOS 62 11/27/2015 0953   BILITOT 0.8 11/27/2015 0953   GFRNONAA 85 11/27/2015 0953   GFRAA 98 11/27/2015 0953    Assessment and Plan :  1. Fever, unspecified fever cause  - POC Influenza A&B(BINAX/QUICKVUE)  2. Walking pneumonia Clinically pt has pneumonia but is stable today. Will attempt outpatint therapy with Doxycycline,pt advised to call back or present to ED if she worsens.Specifically if she becomes more dyspneic or clinically feels she is getting sicker. - doxycycline (VIBRA-TABS) 100 MG tablet; Take 1 tablet (100 mg total) by mouth 2 (two) times daily.  Dispense: 14 tablet; Refill: 0 - DG Chest 2 View  3. Cough  - doxycycline (VIBRA-TABS) 100 MG tablet; Take 1 tablet (100 mg total) by mouth 2 (two) times daily.  Dispense: 14 tablet; Refill: 0 - DG Chest 2 View  4. Asthmatic bronchitis without complication, unspecified asthma severity, unspecified whether persistent O2 sat 93%. Try Breo--1  puff daily. Cough improved slightly with Albuterol nebulizer therapy in office.  I have done the exam and reviewed the above chart and it is accurate to the best of my knowledge. Development worker, community has been used in this note in any air is in the dictation or transcription are unintentional.  [facility] 02/26/2016 2:10 PM

## 2016-02-27 ENCOUNTER — Telehealth: Payer: Self-pay

## 2016-02-27 MED ORDER — DOXYCYCLINE HYCLATE 100 MG PO TABS: 100.0000 mg | ORAL_TABLET | Freq: Two times a day (BID) | ORAL | 0 refills | Status: DC

## 2016-02-27 NOTE — Telephone Encounter (Signed)
-----   Message from Jerrol Banana., MD sent at 02/27/2016  7:35 AM EST ----- Walking pneumonia noted. F/u January for this.

## 2016-02-27 NOTE — Telephone Encounter (Signed)
Ok to rf for 1 week.

## 2016-02-27 NOTE — Telephone Encounter (Signed)
RX sent to pharmacy  

## 2016-02-27 NOTE — Telephone Encounter (Signed)
Patient advised. Patient scheduled for a follow up January. Patient preferred a appointment toward the end of the month.

## 2016-02-29 ENCOUNTER — Emergency Department: Payer: Medicare Other

## 2016-02-29 ENCOUNTER — Encounter: Payer: Self-pay | Admitting: Emergency Medicine

## 2016-02-29 ENCOUNTER — Ambulatory Visit: Payer: Medicare Other | Admitting: Physician Assistant

## 2016-02-29 ENCOUNTER — Inpatient Hospital Stay
Admission: EM | Admit: 2016-02-29 | Discharge: 2016-03-03 | DRG: 193 | Disposition: A | Discharge status: Home / Self Care | Payer: Medicare Other | Attending: Internal Medicine | Admitting: Internal Medicine

## 2016-02-29 DIAGNOSIS — J189 Pneumonia, unspecified organism: Secondary | ICD-10-CM | POA: Diagnosis present

## 2016-02-29 DIAGNOSIS — I10 Essential (primary) hypertension: Secondary | ICD-10-CM | POA: Diagnosis present

## 2016-02-29 DIAGNOSIS — Z833 Family history of diabetes mellitus: Secondary | ICD-10-CM

## 2016-02-29 DIAGNOSIS — Z7901 Long term (current) use of anticoagulants: Secondary | ICD-10-CM

## 2016-02-29 DIAGNOSIS — Z7951 Long term (current) use of inhaled steroids: Secondary | ICD-10-CM

## 2016-02-29 DIAGNOSIS — Z8249 Family history of ischemic heart disease and other diseases of the circulatory system: Secondary | ICD-10-CM

## 2016-02-29 DIAGNOSIS — J9601 Acute respiratory failure with hypoxia: Secondary | ICD-10-CM | POA: Diagnosis present

## 2016-02-29 DIAGNOSIS — I482 Chronic atrial fibrillation: Secondary | ICD-10-CM | POA: Diagnosis present

## 2016-02-29 DIAGNOSIS — J9801 Acute bronchospasm: Secondary | ICD-10-CM | POA: Diagnosis present

## 2016-02-29 DIAGNOSIS — F419 Anxiety disorder, unspecified: Secondary | ICD-10-CM | POA: Diagnosis present

## 2016-02-29 DIAGNOSIS — Z888 Allergy status to other drugs, medicaments and biological substances status: Secondary | ICD-10-CM

## 2016-02-29 DIAGNOSIS — E876 Hypokalemia: Secondary | ICD-10-CM | POA: Diagnosis present

## 2016-02-29 DIAGNOSIS — Z79899 Other long term (current) drug therapy: Secondary | ICD-10-CM

## 2016-02-29 DIAGNOSIS — Z823 Family history of stroke: Secondary | ICD-10-CM

## 2016-02-29 DIAGNOSIS — R0602 Shortness of breath: Secondary | ICD-10-CM | POA: Diagnosis present

## 2016-02-29 DIAGNOSIS — R262 Difficulty in walking, not elsewhere classified: Secondary | ICD-10-CM

## 2016-02-29 DIAGNOSIS — E1140 Type 2 diabetes mellitus with diabetic neuropathy, unspecified: Secondary | ICD-10-CM | POA: Diagnosis present

## 2016-02-29 DIAGNOSIS — Z880 Allergy status to penicillin: Secondary | ICD-10-CM

## 2016-02-29 HISTORY — DX: Unspecified atrial fibrillation: I48.91

## 2016-02-29 HISTORY — DX: Essential (primary) hypertension: I10

## 2016-02-29 LAB — CBC WITH DIFFERENTIAL/PLATELET
BASOS PCT: 0 %
Basophils Absolute: 0 10*3/uL (ref 0–0.1)
EOS ABS: 0 10*3/uL (ref 0–0.7)
EOS PCT: 0 %
HCT: 39.9 % (ref 35.0–47.0)
HEMOGLOBIN: 13.5 g/dL (ref 12.0–16.0)
LYMPHS ABS: 1.3 10*3/uL (ref 1.0–3.6)
Lymphocytes Relative: 21 %
MCH: 28.5 pg (ref 26.0–34.0)
MCHC: 33.9 g/dL (ref 32.0–36.0)
MCV: 84 fL (ref 80.0–100.0)
MONOS PCT: 15 %
Monocytes Absolute: 0.9 10*3/uL (ref 0.2–0.9)
NEUTROS PCT: 64 %
Neutro Abs: 4 10*3/uL (ref 1.4–6.5)
PLATELETS: 186 10*3/uL (ref 150–440)
RBC: 4.75 MIL/uL (ref 3.80–5.20)
RDW: 16.5 % — AB (ref 11.5–14.5)
WBC: 6.3 10*3/uL (ref 3.6–11.0)

## 2016-02-29 LAB — COMPREHENSIVE METABOLIC PANEL
ALBUMIN: 4 g/dL (ref 3.5–5.0)
ALT: 26 U/L (ref 14–54)
ANION GAP: 10 (ref 5–15)
AST: 34 U/L (ref 15–41)
Alkaline Phosphatase: 65 U/L (ref 38–126)
BUN: 10 mg/dL (ref 6–20)
CALCIUM: 8.9 mg/dL (ref 8.9–10.3)
CHLORIDE: 104 mmol/L (ref 101–111)
CO2: 27 mmol/L (ref 22–32)
Creatinine, Ser: 0.61 mg/dL (ref 0.44–1.00)
GFR calc non Af Amer: >60 mL/min (ref >60)
GLUCOSE: 107 mg/dL — AB (ref 65–99)
POTASSIUM: 3 mmol/L — AB (ref 3.5–5.1)
SODIUM: 141 mmol/L (ref 135–145)
Total Bilirubin: 1.1 mg/dL (ref 0.3–1.2)
Total Protein: 7.8 g/dL (ref 6.5–8.1)

## 2016-02-29 LAB — INFLUENZA PANEL BY PCR (TYPE A & B)
Influenza A By PCR: NEGATIVE
Influenza B By PCR: NEGATIVE

## 2016-02-29 LAB — PROTIME-INR
INR: 1.68
PROTHROMBIN TIME: 20 s — AB (ref 11.4–15.2)

## 2016-02-29 MED ORDER — ACETAMINOPHEN 325 MG PO TABS
650.0000 mg | ORAL_TABLET | Freq: Four times a day (QID) | ORAL | Status: DC | PRN
  Administered 2016-03-03: 650 mg via ORAL
  Filled 2016-02-29: qty 2

## 2016-02-29 MED ORDER — HYDRALAZINE HCL 25 MG PO TABS
25.0000 mg | ORAL_TABLET | Freq: Two times a day (BID) | ORAL | Status: DC
  Administered 2016-02-29 – 2016-03-03 (×3): 25 mg via ORAL
  Filled 2016-02-29 (×5): qty 1

## 2016-02-29 MED ORDER — FLECAINIDE ACETATE 50 MG PO TABS
50.0000 mg | ORAL_TABLET | Freq: Every day | ORAL | Status: DC
  Administered 2016-03-01 – 2016-03-03 (×3): 50 mg via ORAL
  Filled 2016-02-29 (×3): qty 1

## 2016-02-29 MED ORDER — BUDESONIDE 0.5 MG/2ML IN SUSP
0.5000 mg | Freq: Two times a day (BID) | RESPIRATORY_TRACT | Status: DC
  Administered 2016-03-01 – 2016-03-03 (×5): 0.5 mg via RESPIRATORY_TRACT
  Filled 2016-02-29 (×5): qty 2

## 2016-02-29 MED ORDER — GABAPENTIN 100 MG PO CAPS
100.0000 mg | ORAL_CAPSULE | Freq: Three times a day (TID) | ORAL | Status: DC
  Administered 2016-02-29 – 2016-03-03 (×8): 100 mg via ORAL
  Filled 2016-02-29 (×8): qty 1

## 2016-02-29 MED ORDER — IPRATROPIUM-ALBUTEROL 0.5-2.5 (3) MG/3ML IN SOLN
3.0000 mL | Freq: Four times a day (QID) | RESPIRATORY_TRACT | Status: DC | PRN
  Administered 2016-03-01 – 2016-03-02 (×3): 3 mL via RESPIRATORY_TRACT
  Filled 2016-02-29 (×3): qty 3

## 2016-02-29 MED ORDER — ONDANSETRON HCL 4 MG/2ML IJ SOLN: 4.0000 mg | Freq: Four times a day (QID) | INTRAMUSCULAR | Status: DC | PRN

## 2016-02-29 MED ORDER — WARFARIN SODIUM 1 MG PO TABS: 2.0000 mg | ORAL_TABLET | ORAL | Status: DC

## 2016-02-29 MED ORDER — POTASSIUM CHLORIDE CRYS ER 20 MEQ PO TBCR
20.0000 meq | EXTENDED_RELEASE_TABLET | Freq: Once | ORAL | Status: AC
  Administered 2016-02-29: 20 meq via ORAL
  Filled 2016-02-29: qty 1

## 2016-02-29 MED ORDER — GUAIFENESIN-DM 100-10 MG/5ML PO SYRP
5.0000 mL | ORAL_SOLUTION | ORAL | Status: DC | PRN
  Administered 2016-02-29: 5 mL via ORAL
  Filled 2016-02-29: qty 5

## 2016-02-29 MED ORDER — IPRATROPIUM-ALBUTEROL 0.5-2.5 (3) MG/3ML IN SOLN
3.0000 mL | Freq: Once | RESPIRATORY_TRACT | Status: AC
  Administered 2016-02-29: 3 mL via RESPIRATORY_TRACT
  Filled 2016-02-29: qty 3

## 2016-02-29 MED ORDER — ALPRAZOLAM 0.25 MG PO TABS: 0.2500 mg | ORAL_TABLET | Freq: Every evening | ORAL | Status: DC | PRN

## 2016-02-29 MED ORDER — LEVOFLOXACIN IN D5W 500 MG/100ML IV SOLN
500.0000 mg | INTRAVENOUS | Status: DC
  Administered 2016-03-01: 500 mg via INTRAVENOUS
  Filled 2016-02-29 (×2): qty 100

## 2016-02-29 MED ORDER — POTASSIUM CHLORIDE CRYS ER 10 MEQ PO TBCR
10.0000 meq | EXTENDED_RELEASE_TABLET | Freq: Every day | ORAL | Status: DC
Start: 2016-03-01 — End: 2016-03-03
  Administered 2016-03-01 – 2016-03-03 (×3): 10 meq via ORAL
  Filled 2016-02-29 (×6): qty 1

## 2016-02-29 MED ORDER — TRIAMTERENE-HCTZ 37.5-25 MG PO TABS
1.0000 | ORAL_TABLET | Freq: Every day | ORAL | Status: DC
  Administered 2016-02-29 – 2016-03-03 (×3): 1 via ORAL
  Filled 2016-02-29 (×4): qty 1

## 2016-02-29 MED ORDER — METOPROLOL TARTRATE 50 MG PO TABS
50.0000 mg | ORAL_TABLET | Freq: Two times a day (BID) | ORAL | Status: DC
  Administered 2016-02-29 – 2016-03-01 (×2): 50 mg via ORAL
  Filled 2016-02-29 (×3): qty 1

## 2016-02-29 MED ORDER — WARFARIN SODIUM 2.5 MG PO TABS
2.5000 mg | ORAL_TABLET | ORAL | Status: DC
  Administered 2016-02-29: 2.5 mg via ORAL
  Filled 2016-02-29: qty 1

## 2016-02-29 MED ORDER — LEVOFLOXACIN IN D5W 750 MG/150ML IV SOLN
750.0000 mg | INTRAVENOUS | Status: DC
  Administered 2016-02-29: 750 mg via INTRAVENOUS
  Filled 2016-02-29: qty 150

## 2016-02-29 MED ORDER — ACETAMINOPHEN 650 MG RE SUPP: 650.0000 mg | Freq: Four times a day (QID) | RECTAL | Status: DC | PRN

## 2016-02-29 MED ORDER — ONDANSETRON HCL 4 MG PO TABS: 4.0000 mg | ORAL_TABLET | Freq: Four times a day (QID) | ORAL | Status: DC | PRN

## 2016-02-29 MED ORDER — METHYLPREDNISOLONE SODIUM SUCC 40 MG IJ SOLR
40.0000 mg | Freq: Two times a day (BID) | INTRAMUSCULAR | Status: DC
  Administered 2016-02-29 – 2016-03-02 (×4): 40 mg via INTRAVENOUS
  Filled 2016-02-29 (×4): qty 1

## 2016-02-29 MED ORDER — WARFARIN - PHARMACIST DOSING INPATIENT: Freq: Every day | Status: DC

## 2016-02-29 NOTE — ED Triage Notes (Signed)
Pt with SOB, productive cough, fever since Monday. Seen at PCP Tuesday and prescribed doxycycline. Pt states she feels worse today. pts o2 sats 89-90 % RA sitting in wheelchair. 2lnc applied.

## 2016-02-29 NOTE — ED Provider Notes (Signed)
Time Seen: Approximately *1735  I have reviewed the triage notes  Chief Complaint: Shortness of Breath   History of Present Illness: Hannah Faulkner is a 75 y.o. female who presents with recent history of community-acquired pneumonia and is currently on doxycycline. Start her primary physician on Tuesday and asked when she was initiated on antibiotics. Family member states she's been ill since last Friday (7 days). Hannah Faulkner of breath, productive cough. Arrives with her pulse ox in the 89-90% range. She was placed on a 2 L nasal cannula and is stabilized at this point. She denies any chest pain or lower extremity discomfort.   Past Medical History:  Diagnosis Date  . A-fib ([place])   . Hypertension     Patient Active Problem List   Diagnosis Date Noted  . Pneumonia 02/29/2016  . Tricuspid regurgitation 05/15/2015  . Mitral valve regurgitation 05/15/2015  . Anxiety 08/25/2014  . A-fib ([place]) 08/25/2014  . Benign neoplasm of colon 08/25/2014  . 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 Diabetic neuropathy ([place]) 08/25/2014  . Essential (primary) hypertension 08/25/2014  . 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 Adiposity 08/25/2014  . Obstructive apnea 08/25/2014  . Hypercholesterolemia without hypertriglyceridemia 08/25/2014  . B12 deficiency 08/25/2014  . Type 2 diabetes mellitus ([place]) 08/25/2014  . H/O cardiac catheterization 07/15/2013  . BP (high blood pressure) 07/15/2013  . Apnea, sleep 07/15/2013  . AF (paroxysmal atrial fibrillation) ([place]) 07/15/2013    Past Surgical History:  Procedure Laterality Date  . EYE SURGERY    . POLYPECTOMY     colon poly premoved    Past Surgical History:  Procedure Laterality Date  . EYE SURGERY    . POLYPECTOMY     colon poly premoved    Current Outpatient Rx  . Order #: [id number] Class: Normal  . Order #: [id number] Class: Historical Med  . Order #: [id number] Class:  Historical Med  . Order #: [id number] Class: Normal  . Order #: [id number] Class: Normal  . Order #: [id number] Class: Historical Med  . Order #: [id number] Class: Historical Med  . Order #: [id number] Class: Historical Med  . Order #: [id number] Class: Normal  . Order #: [id number] Class: Historical Med  . Order #: [id number] Class: Print  . Order #: [id number] Class: Historical Med    Allergies:  Ampicillin; Penicillins; and Prinzide  [lisinopril-hydrochlorothiazide]  Family History: Family History  Problem Relation Age of Onset  . Hypertension Mother   . Heart disease Mother   . CVA Mother   . Heart attack Mother   . Lung cancer Father   . Heart attack Sister   . CVA Sister   . Thyroid disease Sister   . Liver cancer Brother   . Thyroid disease Sister   . Thyroid disease Sister   . Diabetes Sister   . Dementia Sister   . Atrial fibrillation Sister     Social History: Social History  Substance Use Topics  . Smoking status: Never Smoker  . Smokeless tobacco: Never Used  . Alcohol use No     Review of Systems:   10 point review of systems was performed and was otherwise negative:  Constitutional: Patient's had a fever at home. Eyes: No visual disturbances ENT: No sore throat, ear pain Cardiac: No chest pain Respiratory: Increasing shortness of breath with some mild obvious wheezing at home. No stridor Abdomen: No abdominal pain, no vomiting, No diarrhea Endocrine: No weight loss, No night sweats Extremities: No peripheral edema, cyanosis Skin:  No rashes, easy bruising Neurologic: No focal weakness, trouble with speech or swollowing Urologic: No dysuria, Hematuria, or urinary frequency   Physical Exam:  ED Triage Vitals  Enc Vitals Group     BP 02/29/16 1656 (!) 196/74     Pulse Rate 02/29/16 1655 91     Resp --      Temp 02/29/16 1655 99 F (37.2 C)     Temp Source 02/29/16 1655 Oral     SpO2 02/29/16 1655 90 %     Weight 02/29/16 1657 197 lb (89.4 kg)     Height  02/29/16 1657 5\' 1"  (1.549 m)     Head Circumference --      Peak Flow --      Pain Score 02/29/16 1718 3     Pain Loc --      Pain Edu? --      Excl. in [place]? --     General: Awake , Alert , and Oriented times 3; GCS 15 Patient speaks in some interrupted sentences Head: Normal cephalic , atraumatic Eyes: Pupils equal , round, reactive to light Nose/Throat: No nasal drainage, patent upper airway without erythema or exudate.  Neck: Supple, Full range of motion, No anterior adenopathy or palpable thyroid masses Lungs: Coarse breath sounds auscultated bilaterally at the bases with some end expiratory wheezing heard diffusely and symmetrically  Heart: Regular rate, regular rhythm without murmurs , gallops , or rubs Abdomen: Soft, non tender without rebound, guarding , or rigidity; bowel sounds positive and symmetric in all 4 quadrants. No organomegaly .        Extremities: 2 plus symmetric pulses. No edema, clubbing or cyanosis Neurologic: normal ambulation, Motor symmetric without deficits, sensory intact Skin: warm, dry, no rashes   Labs:   All laboratory work was reviewed including any pertinent negatives or positives listed below:  Labs Reviewed  CBC WITH DIFFERENTIAL/PLATELET - Abnormal; Notable for the following:       Result Value   RDW 16.5 (*)    All other components within normal limits  COMPREHENSIVE METABOLIC PANEL - Abnormal; Notable for the following:    Potassium 3.0 (*)    Glucose, Bld 107 (*)    All other components within normal limits  PROTIME-INR - Abnormal; Notable for the following:    Prothrombin Time 20.0 (*)    All other components within normal limits  CULTURE, BLOOD (ROUTINE X 2)  CULTURE, BLOOD (ROUTINE X 2)  INFLUENZA PANEL BY PCR (TYPE A & B, H1N1)    EKG:  ED ECG REPORT I, Daymon Larsen, the attending physician, personally viewed and interpreted this ECG.  Date: 02/29/2016 EKG Time: 1919 Rate:100 Rhythm: Sinus tachycardia QRS Axis: Right  axis deviation Intervals: normal ST/T Wave abnormalities: normal Conduction Disturbances: none Narrative Interpretation: unremarkable No acute ischemic changes  Radiology: * "Dg Chest 2 View  Result Date: 02/29/2016 CLINICAL DATA:  Shortness of breath, cough, fever EXAM: CHEST  2 VIEW COMPARISON:  02/26/2016 FINDINGS: Patchy opacities in the right upper lobe, lingula, and bilateral lower lobes, suspicious for pneumonia. Pulmonary vascular congestion, without frank interstitial edema. Cardiomegaly. IMPRESSION: Patchy opacities in the right upper lobe, lingula, and bilateral lower lobes, suspicious for pneumonia. When compared to the prior, the appearance is similar. Electronically Signed   By: Julian Hy M.D.   On: 02/29/2016 18:35   Dg Chest 2 View  Result Date: 02/26/2016 CLINICAL DATA:  Cough and fever since Saturday. EXAM: CHEST  2 VIEW COMPARISON:  October 11, 2009 FINDINGS: The mediastinal contour is normal. The heart size is enlarged. There is mild diffuse increased pulmonary interstitium bilaterally. Patchy consolidation of the right upper lobe and of the left lung base are noted. Degenerative joint changes of the spine are identified. IMPRESSION: Patchy consolidation of the right upper lobe suspicious for pneumonia. Mild patchy consolidation of left lung base which could be due to either developing pneumonia or atelectasis. Cardiomegaly with mild interstitial edema. Electronically Signed   By: Abelardo Diesel M.D.   On: 02/26/2016 16:10  "  I personally reviewed the radiologic studies    ED Course:  Patient's stay here was uneventful and she had blood cultures 2 obtained and was started on Levaquin IV for community-acquired pneumonia with hypoxia. Patient's case was reviewed with the hospitalist team, further disposition and management depends upon her evaluation. She was given a DuoNeb here in emergency department which seemed to help her with her wheezing and  bronchospasm. Clinical Course      Assessment: * Community-acquired pneumonia Hypoxia  Final Clinical Impression:   Final diagnoses:  Community acquired pneumonia, unspecified laterality     Plan:  Inpatient management          Daymon Larsen, MD 02/29/16 2007

## 2016-02-29 NOTE — Progress Notes (Signed)
ANTIBIOTIC CONSULT NOTE - INITIAL  Pharmacy Consult for Levaquin  Indication: pneumonia  Allergies  Allergen Reactions  . Ampicillin   . Penicillins   . Prinzide  [Lisinopril-Hydrochlorothiazide]     Patient Measurements: Height: 5\' 1"  (154.9 cm) Weight: 186 lb 11.2 oz (84.7 kg) IBW/kg (Calculated) : 47.8 Adjusted Body Weight:   Vital Signs: Temp: 98.7 F (37.1 C) (12/08 2055) Temp Source: Oral (12/08 2055) BP: 176/69 (12/08 2055) Pulse Rate: 98 (12/08 2055) Intake/Output from previous day: No intake/output data recorded. Intake/Output from this shift: No intake/output data recorded.  Labs:  Recent Labs  02/29/16 1744  WBC 6.3  HGB 13.5  PLT 186  CREATININE 0.61   Estimated Creatinine Clearance: 60 mL/min (by C-G formula based on SCr of 0.61 mg/dL). No results for input(s): VANCOTROUGH, VANCOPEAK, VANCORANDOM, GENTTROUGH, GENTPEAK, GENTRANDOM, TOBRATROUGH, TOBRAPEAK, TOBRARND, AMIKACINPEAK, AMIKACINTROU, AMIKACIN in the last 72 hours.   Microbiology: No results found for this or any previous visit (from the past 720 hour(s)).  Medical History: Past Medical History:  Diagnosis Date  . A-fib ([place])   . Hypertension     Medications:  Prescriptions Prior to Admission  Medication Sig Dispense Refill Last Dose  . doxycycline (VIBRA-TABS) 100 MG tablet Take 1 tablet (100 mg total) by mouth 2 (two) times daily. 14 tablet 0 02/29/2016 at 0715  . flecainide (TAMBOCOR) 50 MG tablet Take 50 mg by mouth daily.    02/29/2016 at [place]  . Fluticasone-Salmeterol (ADVAIR DISKUS IN) Inhale 1 puff into the lungs daily as needed.   02/29/2016 at [place]  . gabapentin (NEURONTIN) 100 MG capsule Take 1 capsule (100 mg total) by mouth 3 (three) times daily. 270 capsule 2 02/29/2016 at 0715  . hydrALAZINE (APRESOLINE) 25 MG tablet Take 1 tablet (25 mg total) by mouth 2 (two) times daily. 180 tablet 2 02/29/2016 at 0715  . metoprolol (LOPRESSOR) 50 MG tablet Take 50 mg by mouth.    02/28/2016  at Unknown time  . potassium chloride (K-DUR) 10 MEQ tablet Take 10 mEq by mouth daily.    02/29/2016 at [place]  . Pyridoxine HCl (VITAMIN B6) 200 MG TABS Take by mouth.   02/29/2016 at [place]  . triamterene-hydrochlorothiazide (MAXZIDE-25) 37.5-25 MG tablet TAKE 1 TABLET BY MOUTH EVERY DAY 30 tablet 6 02/28/2016 at Unknown time  . warfarin (COUMADIN) 2.5 MG tablet Take 2-2.5 mg by mouth every other day. Take 2mg  one day and take 2.5mg  next day, alternating.   02/28/2016 at Unknown time  . ALPRAZolam (XANAX) 0.5 MG tablet TAKE 1 TABLET BY MOUTH AT BEDTIME AS NEEDED FOR ANXIETY 30 tablet 5 prn at prn  . PROAIR HFA 108 (90 Base) MCG/ACT inhaler INHALE 2 INHALATIONS INTO THE LUNGS EVERY 6 (SIX) HOURS AS NEEDED FOR WHEEZING.  4 prn at prn   Assessment: CrCl = 60 ml/min  Levaquin 750 mg IV X 1 given on 12/08.  Goal of Therapy:  resolution of infection  Plan:  Expected duration 7 days with resolution of temperature and/or normalization of WBC   Will d/c levaquin 750 mg IV Q24H and start levaquin 500 mg IV Q24H on 12/9 @ 18:00.   Hannah Faulkner D 02/29/2016,9:35 PM

## 2016-02-29 NOTE — ED Notes (Signed)
Pt reports she was dx with "walking pneumonia" Tuesday and started on Doxycycline - pt states she is feeling worse and reports a fever of 100 today - pt reports decreased O2 sat in the lower 90's today - c/o shortness of breath and unable to lay down due to cough - c/o pain with cough and under breast pain - decreased appetite

## 2016-02-29 NOTE — H&P (Signed)
[place] [facility] [place] NAME: Hannah Faulkner    MR#:  [id number]  DATE OF BIRTH:  Jul 29, 1940  DATE OF ADMISSION:  02/29/2016  PRIMARY CARE PHYSICIAN: Wilhemena Durie, MD   REQUESTING/REFERRING PHYSICIAN: Dr. Meade Maw  CHIEF COMPLAINT:   Chief Complaint  Patient presents with  . Shortness of Breath    HISTORY OF PRESENT ILLNESS:  Hannah Faulkner  is a 75 y.o. female with a known history of Atrial fibrillation, hypertension, anxiety, neuropathy who presents to the hospital due to shortness of breath and cough progressively getting worse over the past 3-4 days. Patient developed a cough which is productive of green-yellow sputum and shortness of breath or the past few days and went to see her primary care physician. She was diagnosed with a pneumonia and placed on oral doxycycline. She has been on doxycycline over the past 3-4 days but her symptoms have not improved. She presents to the emergency room and a chest x-ray finding is suggestive of multi lobar pneumonia on the right side. Patient having failed outpatient oral antibiotics is now being admitted to the hospital for treatment for pneumonia. She denies any abdominal pain, diarrhea, melena, hematochezia. She admits to chest tightness associated with a cough. She admits to poor by mouth intake and generalized fatigue and malaise.  PAST MEDICAL HISTORY:   Past Medical History:  Diagnosis Date  . A-fib ([place])   . Hypertension     PAST SURGICAL HISTORY:   Past Surgical History:  Procedure Laterality Date  . EYE SURGERY    . POLYPECTOMY     colon poly premoved    SOCIAL HISTORY:   Social History  Substance Use Topics  . Smoking status: Never Smoker  . Smokeless tobacco: Never Used  . Alcohol use No    FAMILY HISTORY:   Family History  Problem Relation Age of Onset  . Hypertension Mother   . Heart disease Mother   . CVA Mother   . Heart attack Mother   . Lung cancer Father   .  Heart attack Sister   . CVA Sister   . Thyroid disease Sister   . Liver cancer Brother   . Thyroid disease Sister   . Thyroid disease Sister   . Diabetes Sister   . Dementia Sister   . Atrial fibrillation Sister     DRUG ALLERGIES:   Allergies  Allergen Reactions  . Ampicillin   . Penicillins   . Prinzide  [Lisinopril-Hydrochlorothiazide]     REVIEW OF SYSTEMS:   Review of Systems  Constitutional: Positive for malaise/fatigue. Negative for fever and weight loss.  HENT: Negative for congestion, nosebleeds and tinnitus.   Eyes: Negative for blurred vision, double vision and redness.  Respiratory: Positive for cough, sputum production, shortness of breath and wheezing. Negative for hemoptysis.   Cardiovascular: Negative for chest pain, orthopnea, leg swelling and PND.  Gastrointestinal: Negative for abdominal pain, diarrhea, melena, nausea and vomiting.  Genitourinary: Negative for dysuria, hematuria and urgency.  Musculoskeletal: Negative for falls and joint pain.  Neurological: Negative for dizziness, tingling, sensory change, focal weakness, seizures, weakness and headaches.  Endo/Heme/Allergies: Negative for polydipsia. Does not bruise/bleed easily.  Psychiatric/Behavioral: Negative for depression and memory loss. The patient is not nervous/anxious.     MEDICATIONS AT HOME:   Prior to Admission medications   Medication Sig Start Date End Date Taking? Authorizing Provider  doxycycline (VIBRA-TABS) 100 MG tablet Take 1 tablet (100 mg total) by  mouth 2 (two) times daily. 02/26/16  Yes Richard Maceo Pro., MD  flecainide (TAMBOCOR) 50 MG tablet Take 50 mg by mouth daily.  11/16/13  Yes Historical Provider, MD  Fluticasone-Salmeterol (ADVAIR DISKUS IN) Inhale 1 puff into the lungs daily as needed.   Yes Historical Provider, MD  gabapentin (NEURONTIN) 100 MG capsule Take 1 capsule (100 mg total) by mouth 3 (three) times daily. 01/30/16  Yes Richard Maceo Pro., MD  hydrALAZINE  (APRESOLINE) 25 MG tablet Take 1 tablet (25 mg total) by mouth 2 (two) times daily. 01/30/16  Yes Richard Maceo Pro., MD  metoprolol (LOPRESSOR) 50 MG tablet Take 50 mg by mouth.  11/16/13  Yes Historical Provider, MD  potassium chloride (K-DUR) 10 MEQ tablet Take 10 mEq by mouth daily.  11/16/13  Yes Historical Provider, MD  Pyridoxine HCl (VITAMIN B6) 200 MG TABS Take by mouth. 11/16/13  Yes Historical Provider, MD  triamterene-hydrochlorothiazide (MAXZIDE-25) 37.5-25 MG tablet TAKE 1 TABLET BY MOUTH EVERY DAY 07/31/15  Yes Jerrol Banana., MD  warfarin (COUMADIN) 2.5 MG tablet Take 2-2.5 mg by mouth every other day. Take 2mg  one day and take 2.5mg  next day, alternating. 11/16/13  Yes Historical Provider, MD  ALPRAZolam Duanne Moron) 0.5 MG tablet TAKE 1 TABLET BY MOUTH AT BEDTIME AS NEEDED FOR ANXIETY 01/08/16   Jerrol Banana., MD  PROAIR HFA 108 [phone] Base) MCG/ACT inhaler INHALE 2 INHALATIONS INTO THE LUNGS EVERY 6 (SIX) HOURS AS NEEDED FOR WHEEZING. 04/04/15   Historical Provider, MD      VITAL SIGNS:  Blood pressure (!) 176/89, pulse 98, temperature 99 F (37.2 C), temperature source Oral, height 5\' 1"  (1.549 m), weight 89.4 kg (197 lb), SpO2 96 %.  PHYSICAL EXAMINATION:  Physical Exam  GENERAL:  75 y.o.-year-old patient lying in the bed in mild resp. Distress with wheezing. EYES: Pupils equal, round, reactive to light and accommodation. No scleral icterus. Extraocular muscles intact.  HEENT: Head atraumatic, normocephalic. Oropharynx and nasopharynx clear. No oropharyngeal erythema, moist oral mucosa  NECK:  Supple, no jugular venous distention. No thyroid enlargement, no tenderness.  LUNGS: Good air entry bilaterally, diffuse and expiratory wheezing bilaterally, no rhonchi, rales. Positive use of accessory muscles.  CARDIOVASCULAR: S1, S2 RRR. No murmurs, rubs, gallops, clicks.  ABDOMEN: Soft, nontender, nondistended. Bowel sounds present. No organomegaly or mass.  EXTREMITIES: No  pedal edema, cyanosis, or clubbing. + 2 pedal & radial pulses b/l.   NEUROLOGIC: Cranial nerves II through XII are intact. No focal Motor or sensory deficits appreciated b/l PSYCHIATRIC: The patient is alert and oriented x 3. Good affect.  SKIN: No obvious rash, lesion, or ulcer.   LABORATORY PANEL:   CBC  Recent Labs Lab 02/29/16 1744  WBC 6.3  HGB 13.5  HCT 39.9  PLT 186   ------------------------------------------------------------------------------------------------------------------  Chemistries   Recent Labs Lab 02/29/16 1744  NA 141  K 3.0*  CL 104  CO2 27  GLUCOSE 107*  BUN 10  CREATININE 0.61  CALCIUM 8.9  AST 34  ALT 26  ALKPHOS 65  BILITOT 1.1   ------------------------------------------------------------------------------------------------------------------  Cardiac Enzymes No results for input(s): TROPONINI in the last 168 hours. ------------------------------------------------------------------------------------------------------------------  RADIOLOGY:  Dg Chest 2 View  Result Date: 02/29/2016 CLINICAL DATA:  Shortness of breath, cough, fever EXAM: CHEST  2 VIEW COMPARISON:  02/26/2016 FINDINGS: Patchy opacities in the right upper lobe, lingula, and bilateral lower lobes, suspicious for pneumonia. Pulmonary vascular congestion, without frank interstitial edema. Cardiomegaly. IMPRESSION:  Patchy opacities in the right upper lobe, lingula, and bilateral lower lobes, suspicious for pneumonia. When compared to the prior, the appearance is similar. Electronically Signed   By: Julian Hy M.D.   On: 02/29/2016 18:35     IMPRESSION AND PLAN:   75 year old female with past medical history of atrial fibrillation, hypertension, anxiety, neuropathy who presents to the hospital due to shortness of breath and cough and noted to have a pneumonia.  1. Pneumonia-community-acquired and multi lobar nature. -Failed oral antibiotics with doxycycline. We'll start  on IV Levaquin, follow blood, sputum cultures.  2. Acute respiratory failure with hypoxia-secondary to pneumonia. -Continue O2 supplementation-we will add some IV steroids and DuoNeb's for wheezing and bronchospasm.  3. Essential hypertension-continue hydralazine, metoprolol, triamterene/HCTZ.  4. History of chronic atrial fibrillation-rate controlled. Continue flecainide, metoprolol. -Continue warfarin, follow INR to goal between 2 and 3.  5. Anxiety-continue Xanax.  6. Neuropathy-continue gabapentin.    All the records are reviewed and case discussed with ED provider. Management plans discussed with the patient, family and they are in agreement.  CODE STATUS: Full  TOTAL TIME TAKING CARE OF THIS PATIENT: 45 minutes.    Henreitta Leber M.D on 02/29/2016 at 7:45 PM  Between 7am to 6pm - Pager - [phone]  After 6pm go to [email or web address] - password [email or web address]  Office  [phone]  CC: Primary care physician; Wilhemena Durie, MD

## 2016-02-29 NOTE — Progress Notes (Signed)
ANTICOAGULATION CONSULT NOTE - Initial Consult  Pharmacy Consult for Warfarin  Indication: atrial fibrillation  Allergies  Allergen Reactions  . Ampicillin   . Penicillins   . Prinzide  [Lisinopril-Hydrochlorothiazide]     Patient Measurements: Height: 5\' 1"  (154.9 cm) Weight: 186 lb 11.2 oz (84.7 kg) IBW/kg (Calculated) : 47.8 Heparin Dosing Weight:   Vital Signs: Temp: 98.7 F (37.1 C) (12/08 2055) Temp Source: Oral (12/08 2055) BP: 176/69 (12/08 2055) Pulse Rate: 98 (12/08 2055)  Labs:  Recent Labs  02/29/16 1744  HGB 13.5  HCT 39.9  PLT 186  LABPROT 20.0*  INR 1.68  CREATININE 0.61    Estimated Creatinine Clearance: 60 mL/min (by C-G formula based on SCr of 0.61 mg/dL).   Medical History: Past Medical History:  Diagnosis Date  . A-fib ([place])   . Hypertension     Medications:  Prescriptions Prior to Admission  Medication Sig Dispense Refill Last Dose  . doxycycline (VIBRA-TABS) 100 MG tablet Take 1 tablet (100 mg total) by mouth 2 (two) times daily. 14 tablet 0 02/29/2016 at 0715  . flecainide (TAMBOCOR) 50 MG tablet Take 50 mg by mouth daily.    02/29/2016 at [place]  . Fluticasone-Salmeterol (ADVAIR DISKUS IN) Inhale 1 puff into the lungs daily as needed.   02/29/2016 at [place]  . gabapentin (NEURONTIN) 100 MG capsule Take 1 capsule (100 mg total) by mouth 3 (three) times daily. 270 capsule 2 02/29/2016 at 0715  . hydrALAZINE (APRESOLINE) 25 MG tablet Take 1 tablet (25 mg total) by mouth 2 (two) times daily. 180 tablet 2 02/29/2016 at 0715  . metoprolol (LOPRESSOR) 50 MG tablet Take 50 mg by mouth.    02/28/2016 at Unknown time  . potassium chloride (K-DUR) 10 MEQ tablet Take 10 mEq by mouth daily.    02/29/2016 at [place]  . Pyridoxine HCl (VITAMIN B6) 200 MG TABS Take by mouth.   02/29/2016 at [place]  . triamterene-hydrochlorothiazide (MAXZIDE-25) 37.5-25 MG tablet TAKE 1 TABLET BY MOUTH EVERY DAY 30 tablet 6 02/28/2016 at Unknown time  . warfarin (COUMADIN) 2.5 MG  tablet Take 2-2.5 mg by mouth every other day. Take 2mg  one day and take 2.5mg  next day, alternating.   02/28/2016 at Unknown time  . ALPRAZolam (XANAX) 0.5 MG tablet TAKE 1 TABLET BY MOUTH AT BEDTIME AS NEEDED FOR ANXIETY 30 tablet 5 prn at prn  . PROAIR HFA 108 (90 Base) MCG/ACT inhaler INHALE 2 INHALATIONS INTO THE LUNGS EVERY 6 (SIX) HOURS AS NEEDED FOR WHEEZING.  4 prn at prn    Assessment: Pharmacy consulted to dose warfarin for Afib in this 75 year old female. Pt was on Warfarin 2 mg every other day alternating with warfarin 2.5 mg every other day.   Last dose of warfarin 2 mg given on 12/7.     12/8:  INR = 1.68  Goal of Therapy:  INR 2-3   Plan:  Will order warfarin 2.5 mg every other day to resume 12/8 @ 22:00. Will recheck INR on 12/9 with AM labs.  If INR is therapeutic would continue pt on home dose.   Craige Patel D 02/29/2016,9:44 PM

## 2016-03-01 LAB — CBC
HCT: 37.9 % (ref 35.0–47.0)
HEMOGLOBIN: 12.9 g/dL (ref 12.0–16.0)
MCH: 29.1 pg (ref 26.0–34.0)
MCHC: 33.9 g/dL (ref 32.0–36.0)
MCV: 85.9 fL (ref 80.0–100.0)
PLATELETS: 164 10*3/uL (ref 150–440)
RBC: 4.41 MIL/uL (ref 3.80–5.20)
RDW: 16.3 % — ABNORMAL HIGH (ref 11.5–14.5)
WBC: 4.9 10*3/uL (ref 3.6–11.0)

## 2016-03-01 LAB — BASIC METABOLIC PANEL
ANION GAP: 7 (ref 5–15)
BUN: 10 mg/dL (ref 6–20)
CALCIUM: 8.5 mg/dL — AB (ref 8.9–10.3)
CO2: 28 mmol/L (ref 22–32)
CREATININE: 0.64 mg/dL (ref 0.44–1.00)
Chloride: 104 mmol/L (ref 101–111)
GLUCOSE: 213 mg/dL — AB (ref 65–99)
Potassium: 3.3 mmol/L — ABNORMAL LOW (ref 3.5–5.1)
Sodium: 139 mmol/L (ref 135–145)

## 2016-03-01 LAB — PROTIME-INR
INR: 1.65
Prothrombin Time: 19.7 s — ABNORMAL HIGH (ref 11.4–15.2)

## 2016-03-01 MED ORDER — WARFARIN SODIUM 1 MG PO TABS
3.5000 mg | ORAL_TABLET | Freq: Once | ORAL | Status: AC
  Administered 2016-03-01: 18:00:00 3.5 mg via ORAL
  Filled 2016-03-01: qty 1

## 2016-03-01 MED ORDER — NYSTATIN 100000 UNIT/ML MT SUSP
5.0000 mL | Freq: Four times a day (QID) | OROMUCOSAL | Status: DC
  Administered 2016-03-01 – 2016-03-03 (×8): 500000 [IU] via OROMUCOSAL
  Filled 2016-03-01 (×8): qty 5

## 2016-03-01 NOTE — Progress Notes (Signed)
On assessment patient alert and oriented X4. Patient sitting up eating. Patient has expiratory wheezing but no SOB. Patient is on 2L O2. Patient is complaining of soreness in her mouth and her tongue has some light patches on her tongue. MD aware and ordered Nystatin.   Deri Fuelling, RN

## 2016-03-01 NOTE — Progress Notes (Signed)
[place] [place] NAME: Hannah Faulkner    MR#:  [id number]  DATE OF BIRTH:  03-30-1940  SUBJECTIVE:   Patient here due to acute respiratory failure with hypoxia secondary to pneumonia. Continues to have some shortness of breath but improved since yesterday. Wheezing improved since yesterday.  REVIEW OF SYSTEMS:    Review of Systems  Constitutional: Negative for chills and fever.  HENT: Negative for congestion and tinnitus.   Eyes: Negative for blurred vision and double vision.  Respiratory: Positive for cough, shortness of breath and wheezing.   Cardiovascular: Negative for chest pain, orthopnea and PND.  Gastrointestinal: Negative for abdominal pain, diarrhea, nausea and vomiting.  Genitourinary: Negative for dysuria and hematuria.  Neurological: Negative for dizziness, sensory change and focal weakness.  All other systems reviewed and are negative.   Nutrition: Heart Healthy Tolerating Diet: Yes Tolerating PT: Await eval.     DRUG ALLERGIES:   Allergies  Allergen Reactions  . Ampicillin   . Penicillins   . Prinzide  [Lisinopril-Hydrochlorothiazide]     VITALS:  Blood pressure 140/66, pulse 70, temperature 97.7 F (36.5 C), temperature source Oral, resp. rate 18, height 5\' 1"  (1.549 m), weight 84.7 kg (186 lb 11.2 oz), SpO2 95 %.  PHYSICAL EXAMINATION:   Physical Exam  GENERAL:  75 y.o.-year-old patient lying in the bed in no acute distress.  EYES: Pupils equal, round, reactive to light and accommodation. No scleral icterus. Extraocular muscles intact.  HEENT: Head atraumatic, normocephalic. Oropharynx and nasopharynx clear.  NECK:  Supple, no jugular venous distention. No thyroid enlargement, no tenderness.  LUNGS: Prolonged insp & exp. phase, end-exp. Wheezing b/l, No rales, rhonchi. No use of accessory muscles of respiration.  CARDIOVASCULAR: S1, S2 normal. No murmurs, rubs, or gallops.  ABDOMEN: Soft, nontender,  nondistended. Bowel sounds present. No organomegaly or mass.  EXTREMITIES: No cyanosis, clubbing or edema b/l.    NEUROLOGIC: Cranial nerves II through XII are intact. No focal Motor or sensory deficits b/l.   PSYCHIATRIC: The patient is alert and oriented x 3.  SKIN: No obvious rash, lesion, or ulcer.    LABORATORY PANEL:   CBC  Recent Labs Lab 03/01/16 0356  WBC 4.9  HGB 12.9  HCT 37.9  PLT 164   ------------------------------------------------------------------------------------------------------------------  Chemistries   Recent Labs Lab 02/29/16 1744 03/01/16 0356  NA 141 139  K 3.0* 3.3*  CL 104 104  CO2 27 28  GLUCOSE 107* 213*  BUN 10 10  CREATININE 0.61 0.64  CALCIUM 8.9 8.5*  AST 34  --   ALT 26  --   ALKPHOS 65  --   BILITOT 1.1  --    ------------------------------------------------------------------------------------------------------------------  Cardiac Enzymes No results for input(s): TROPONINI in the last 168 hours. ------------------------------------------------------------------------------------------------------------------  RADIOLOGY:  Dg Chest 2 View  Result Date: 02/29/2016 CLINICAL DATA:  Shortness of breath, cough, fever EXAM: CHEST  2 VIEW COMPARISON:  02/26/2016 FINDINGS: Patchy opacities in the right upper lobe, lingula, and bilateral lower lobes, suspicious for pneumonia. Pulmonary vascular congestion, without frank interstitial edema. Cardiomegaly. IMPRESSION: Patchy opacities in the right upper lobe, lingula, and bilateral lower lobes, suspicious for pneumonia. When compared to the prior, the appearance is similar. Electronically Signed   By: Julian Hy M.D.   On: 02/29/2016 18:35     ASSESSMENT AND PLAN:   75 year old female with past medical history of atrial fibrillation, hypertension, anxiety, neuropathy who presents to the hospital due to shortness of  breath and cough and noted to have a pneumonia.  1.  Pneumonia-community-acquired and multi lobar in nature. -Failed oral antibiotics with doxycycline.  - cont. Levaquin, follow blood/sputum Cx.  Improved since yesterday.   2. Acute respiratory failure with hypoxia-secondary to pneumonia. -Continue O2 supplementation - cont. IV steroids but will taper, cont. Duonebs, Pulmicort nebs.    3. Essential hypertension-continue hydralazine, metoprolol, triamterene/HCTZ. - BP stable.   4. History of chronic atrial fibrillation-rate controlled. Continue flecainide, metoprolol. -Continue warfarin, follow INR to goal between 2 and 3.  5. Anxiety-continue Xanax.  6. Neuropathy-continue gabapentin.   All the records are reviewed and case discussed with Care Management/Social Worker. Management plans discussed with the patient, family and they are in agreement.  CODE STATUS: Full code  DVT Prophylaxis: Warfarin  TOTAL TIME TAKING CARE OF THIS PATIENT: 25 minutes.   POSSIBLE D/C IN 1-2 DAYS, DEPENDING ON CLINICAL CONDITION.   Henreitta Leber M.D on 03/01/2016 at 12:46 PM  Between 7am to 6pm - Pager - [phone]  After 6pm go to [email or web address] - Proofreader  [facility]  Office  [phone]  CC: Primary care physician; Wilhemena Durie, MD

## 2016-03-01 NOTE — Progress Notes (Signed)
ANTICOAGULATION CONSULT NOTE - Initial Consult  Pharmacy Consult for Warfarin  Indication: atrial fibrillation  Allergies  Allergen Reactions  . Ampicillin   . Penicillins   . Prinzide  [Lisinopril-Hydrochlorothiazide]     Patient Measurements: Height: 5\' 1"  (154.9 cm) Weight: 186 lb 11.2 oz (84.7 kg) IBW/kg (Calculated) : 47.8 Heparin Dosing Weight:   Vital Signs: Temp: 97.7 F (36.5 C) (12/09 0709) Temp Source: Oral (12/09 0709) BP: 140/66 (12/09 0709) Pulse Rate: 70 (12/09 0709)  Labs:  Recent Labs  02/29/16 1744 03/01/16 0356  HGB 13.5 12.9  HCT 39.9 37.9  PLT 186 164  LABPROT 20.0* 19.7*  INR 1.68 1.65  CREATININE 0.61 0.64    Estimated Creatinine Clearance: 60 mL/min (by C-G formula based on SCr of 0.64 mg/dL).   Medical History: Past Medical History:  Diagnosis Date  . A-fib ([place])   . Hypertension     Medications:  Prescriptions Prior to Admission  Medication Sig Dispense Refill Last Dose  . doxycycline (VIBRA-TABS) 100 MG tablet Take 1 tablet (100 mg total) by mouth 2 (two) times daily. 14 tablet 0 02/29/2016 at 0715  . flecainide (TAMBOCOR) 50 MG tablet Take 50 mg by mouth daily.    02/29/2016 at [place]  . Fluticasone-Salmeterol (ADVAIR DISKUS IN) Inhale 1 puff into the lungs daily as needed.   02/29/2016 at [place]  . gabapentin (NEURONTIN) 100 MG capsule Take 1 capsule (100 mg total) by mouth 3 (three) times daily. 270 capsule 2 02/29/2016 at 0715  . hydrALAZINE (APRESOLINE) 25 MG tablet Take 1 tablet (25 mg total) by mouth 2 (two) times daily. 180 tablet 2 02/29/2016 at 0715  . metoprolol (LOPRESSOR) 50 MG tablet Take 50 mg by mouth.    02/28/2016 at Unknown time  . potassium chloride (K-DUR) 10 MEQ tablet Take 10 mEq by mouth daily.    02/29/2016 at [place]  . Pyridoxine HCl (VITAMIN B6) 200 MG TABS Take by mouth.   02/29/2016 at [place]  . triamterene-hydrochlorothiazide (MAXZIDE-25) 37.5-25 MG tablet TAKE 1 TABLET BY MOUTH EVERY DAY 30 tablet 6 02/28/2016 at  Unknown time  . warfarin (COUMADIN) 2.5 MG tablet Take 2-2.5 mg by mouth every other day. Take 2mg  one day and take 2.5mg  next day, alternating.   02/28/2016 at Unknown time  . ALPRAZolam (XANAX) 0.5 MG tablet TAKE 1 TABLET BY MOUTH AT BEDTIME AS NEEDED FOR ANXIETY 30 tablet 5 prn at prn  . PROAIR HFA 108 (90 Base) MCG/ACT inhaler INHALE 2 INHALATIONS INTO THE LUNGS EVERY 6 (SIX) HOURS AS NEEDED FOR WHEEZING.  4 prn at prn    Assessment: Pharmacy consulted to dose warfarin for Afib in this 75 year old female. Pt was on Warfarin 2 mg every other day alternating with warfarin 2.5 mg every other day.   Last dose of warfarin 2 mg given on 12/7.  Date INR Warfarin dose 12/8 1.68 2.5mg  12/9 1.65  Goal of Therapy:  INR 2-3   Plan:  INR subtherapeutic on admission and remains so today. Patient was on doxycycline as outpatient, now on levofloxacin. Will monitor closely as may increase effectiveness of warfarin. Will give warfarin 3.5mg  x 1 this evening, recheck INR with AM labs.  Hagan Vanauken C 03/01/2016,9:37 AM

## 2016-03-02 LAB — POTASSIUM: Potassium: 3.4 mmol/L — ABNORMAL LOW (ref 3.5–5.1)

## 2016-03-02 LAB — PROTIME-INR
INR: 2.02
PROTHROMBIN TIME: 23.2 s — AB (ref 11.4–15.2)

## 2016-03-02 MED ORDER — LEVOFLOXACIN 500 MG PO TABS
500.0000 mg | ORAL_TABLET | Freq: Every day | ORAL | Status: DC
  Administered 2016-03-02: 500 mg via ORAL
  Filled 2016-03-02: qty 1

## 2016-03-02 MED ORDER — POTASSIUM CHLORIDE CRYS ER 20 MEQ PO TBCR
40.0000 meq | EXTENDED_RELEASE_TABLET | Freq: Once | ORAL | Status: AC
  Administered 2016-03-02: 40 meq via ORAL
  Filled 2016-03-02: qty 2

## 2016-03-02 MED ORDER — METHYLPREDNISOLONE SODIUM SUCC 40 MG IJ SOLR
40.0000 mg | Freq: Every day | INTRAMUSCULAR | Status: DC
  Administered 2016-03-03: 40 mg via INTRAVENOUS
  Filled 2016-03-02: qty 1

## 2016-03-02 MED ORDER — METOPROLOL TARTRATE 50 MG PO TABS
50.0000 mg | ORAL_TABLET | Freq: Every day | ORAL | Status: DC
  Administered 2016-03-03: 50 mg via ORAL
  Filled 2016-03-02 (×2): qty 1

## 2016-03-02 MED ORDER — GUAIFENESIN-DM 100-10 MG/5ML PO SYRP
5.0000 mL | ORAL_SOLUTION | ORAL | Status: DC | PRN
  Administered 2016-03-02: 5 mL via ORAL
  Filled 2016-03-02: qty 5

## 2016-03-02 MED ORDER — WARFARIN SODIUM 1 MG PO TABS
2.0000 mg | ORAL_TABLET | Freq: Once | ORAL | Status: AC
  Administered 2016-03-02: 2 mg via ORAL
  Filled 2016-03-02: qty 2

## 2016-03-02 NOTE — Progress Notes (Signed)
[facility] [facility] [place] NAME: Hannah Faulkner    MR#:  [id number]  DATE OF BIRTH:  Aug 19, 1940  SUBJECTIVE:   Patient here due to acute respiratory failure with hypoxia secondary to pneumonia. Improving and shortness of breath, wheezing improved.  Will ambulate today and assess her symptoms. Son at bedside.   REVIEW OF SYSTEMS:    Review of Systems  Constitutional: Negative for chills and fever.  HENT: Negative for congestion and tinnitus.   Eyes: Negative for blurred vision and double vision.  Respiratory: Positive for cough, shortness of breath and wheezing.   Cardiovascular: Negative for chest pain, orthopnea and PND.  Gastrointestinal: Negative for abdominal pain, diarrhea, nausea and vomiting.  Genitourinary: Negative for dysuria and hematuria.  Neurological: Negative for dizziness, sensory change and focal weakness.  All other systems reviewed and are negative.   Nutrition: Heart Healthy Tolerating Diet: Yes Tolerating PT: Await eval.     DRUG ALLERGIES:   Allergies  Allergen Reactions  . Ampicillin   . Penicillins   . Prinzide  [Lisinopril-Hydrochlorothiazide]     VITALS:  Blood pressure (!) 116/50, pulse 68, temperature 98.1 F (36.7 C), temperature source Oral, resp. rate 18, height 5\' 1"  (1.549 m), weight 84.7 kg (186 lb 11.2 oz), SpO2 94 %.  PHYSICAL EXAMINATION:   Physical Exam  GENERAL:  75 y.o.-year-old patient lying in the bed in no acute distress.  EYES: Pupils equal, round, reactive to light and accommodation. No scleral icterus. Extraocular muscles intact.  HEENT: Head atraumatic, normocephalic. Oropharynx and nasopharynx clear.  NECK:  Supple, no jugular venous distention. No thyroid enlargement, no tenderness.  LUNGS: good a/e b/l,  minimal Wheezing b/l, No rales, rhonchi. No use of accessory muscles of respiration.  CARDIOVASCULAR: S1, S2 normal. No murmurs, rubs, or gallops.  ABDOMEN: Soft, nontender,  nondistended. Bowel sounds present. No organomegaly or mass.  EXTREMITIES: No cyanosis, clubbing or edema b/l.    NEUROLOGIC: Cranial nerves II through XII are intact. No focal Motor or sensory deficits b/l.   PSYCHIATRIC: The patient is alert and oriented x 3.  SKIN: No obvious rash, lesion, or ulcer.    LABORATORY PANEL:   CBC  Recent Labs Lab 03/01/16 0356  WBC 4.9  HGB 12.9  HCT 37.9  PLT 164   ------------------------------------------------------------------------------------------------------------------  Chemistries   Recent Labs Lab 02/29/16 1744 03/01/16 0356 03/02/16 0359  NA 141 139  --   K 3.0* 3.3* 3.4*  CL 104 104  --   CO2 27 28  --   GLUCOSE 107* 213*  --   BUN 10 10  --   CREATININE 0.61 0.64  --   CALCIUM 8.9 8.5*  --   AST 34  --   --   ALT 26  --   --   ALKPHOS 65  --   --   BILITOT 1.1  --   --    ------------------------------------------------------------------------------------------------------------------  Cardiac Enzymes No results for input(s): TROPONINI in the last 168 hours. ------------------------------------------------------------------------------------------------------------------  RADIOLOGY:  Dg Chest 2 View  Result Date: 02/29/2016 CLINICAL DATA:  Shortness of breath, cough, fever EXAM: CHEST  2 VIEW COMPARISON:  02/26/2016 FINDINGS: Patchy opacities in the right upper lobe, lingula, and bilateral lower lobes, suspicious for pneumonia. Pulmonary vascular congestion, without frank interstitial edema. Cardiomegaly. IMPRESSION: Patchy opacities in the right upper lobe, lingula, and bilateral lower lobes, suspicious for pneumonia. When compared to the prior, the appearance is similar. Electronically Signed  By: Julian Hy M.D.   On: 02/29/2016 18:35     ASSESSMENT AND PLAN:   75 year old female with past medical history of atrial fibrillation, hypertension, anxiety, neuropathy who presents to the hospital due to  shortness of breath and cough and noted to have a pneumonia.  1. Pneumonia-community-acquired and multi lobar in nature. -Failed oral antibiotics with doxycycline.  - cont. Levaquin, cultures so far (-).  Clinically improved.   2. Acute respiratory failure with hypoxia-secondary to pneumonia. -Continue O2 supplementation -  Will taper IV steroids further, cont. Duonebs, Pulmicort nebs.    3. Essential hypertension- bp on low side today and therefore meds held will monitor.  - continue hydralazine, metoprolol, triamterene/HCTZ once BP improves.   4. History of chronic atrial fibrillation-rate controlled. Continue flecainide, metoprolol. -Continue warfarin, INR therapeutic  5. Anxiety-continue Xanax.  6. Neuropathy-continue gabapentin.  7. Hypokalemia - potassium replaced orally and will repeat in a.m. Tomorrow.    All the records are reviewed and case discussed with Care Management/Social Worker. Management plans discussed with the patient, family and they are in agreement.  CODE STATUS: Full code  DVT Prophylaxis: Warfarin  TOTAL TIME TAKING CARE OF THIS PATIENT: 25 minutes.   POSSIBLE D/C IN 1-2 DAYS, DEPENDING ON CLINICAL CONDITION.   Henreitta Leber M.D on 03/02/2016 at 12:50 PM  Between 7am to 6pm - Pager - [phone]  After 6pm go to [email or web address] - Proofreader  [facility]  Office  [phone]  CC: Primary care physician; Wilhemena Durie, MD

## 2016-03-02 NOTE — Progress Notes (Signed)
ANTIBIOTIC CONSULT NOTE - INITIAL  Pharmacy Consult for Levaquin  Indication: pneumonia  Allergies  Allergen Reactions  . Ampicillin   . Penicillins   . Prinzide  [Lisinopril-Hydrochlorothiazide]     Patient Measurements: Height: 5\' 1"  (154.9 cm) Weight: 186 lb 11.2 oz (84.7 kg) IBW/kg (Calculated) : 47.8  Vital Signs: Temp: 98.1 F (36.7 C) (12/10 0726) Temp Source: Oral (12/10 0726) BP: 116/50 (12/10 0726) Pulse Rate: 68 (12/10 0726) Intake/Output from previous day: 12/09 0701 - 12/10 0700 In: 1060 [P.O.:960; IV Piggyback:100] Out: -  Intake/Output from this shift: No intake/output data recorded.  Labs:  Recent Labs  02/29/16 1744 03/01/16 0356  WBC 6.3 4.9  HGB 13.5 12.9  PLT 186 164  CREATININE 0.61 0.64   Estimated Creatinine Clearance: 60 mL/min (by C-G formula based on SCr of 0.64 mg/dL).  Microbiology: Recent Results (from the past 720 hour(s))  Culture, blood (Routine X 2) w Reflex to ID Panel     Status: None (Preliminary result)   Collection Time: 02/29/16  5:44 PM  Result Value Ref Range Status   Specimen Description BLOOD LT [facility]  Final   Special Requests   Final    BOTTLES DRAWN AEROBIC AND ANAEROBIC AER 18CC, ANA18CC   Culture NO GROWTH 2 DAYS  Final   Report Status PENDING  Incomplete  Culture, blood (Routine X 2) w Reflex to ID Panel     Status: None (Preliminary result)   Collection Time: 02/29/16  6:43 PM  Result Value Ref Range Status   Specimen Description BLOOD RT [facility]  Final   Special Requests BOTTLES DRAWN AEROBIC AND ANAEROBIC 12CC  Final   Culture NO GROWTH 2 DAYS  Final   Report Status PENDING  Incomplete    Medical History: Past Medical History:  Diagnosis Date  . A-fib ([place])   . Hypertension     Assessment: Pharmacy consulted to dose levofloxacin for CAP. Failed doxycycline prior to admission.  Goal of Therapy:  resolution of infection  Plan:  Patient is currently on day # 3 of antibiotic therapy and improving on  levofloxacin 500 mg IV daily.   Will change levofloxacin to 500 mg PO daily per protocol as patient has WBC WNL, is afebrile, and taking other PO medications.  Lenis Noon, PharmD, BCPS Clinical Pharmacist 03/02/2016,8:11 AM

## 2016-03-02 NOTE — Evaluation (Signed)
Physical Therapy Evaluation Patient Details Name: Hannah Faulkner MRN: [id number] DOB: 09-Dec-1940 Today's Date: 03/02/2016   History of Present Illness  Aleaya Dakin is a 75yo white female who comes to [facility] after 3-4 days of SOB and sputum productions. Pt admitted for hypoxia and PNA s/p pt failed oral ABX for PNA from PCP. PMH: afib, HTN, anxiety.   Clinical Impression  Upon entry, the patient is received seated EOB finishing lunch, Son (POA) present. The pt is awake and agreeable to participate. No acute distress noted at this time, pt reports only feeling tired. VSS during eval, mild tachycardia (110s) with prolonged AMB, and SaO2:90% on room air with exersion. The pt is alert and oriented x3, pleasant, conversational, and following simple and multi-step commands consistently. Pt received on 2L O2, and left on room air at exit (RN notified). Pt performs all basic mobility near baseline at modified independent level. No patent LOB noted.The patient is at moderate risk for falls as evidence by gait speed <1.38m/s.  Patient presenting with minimal impairments, functioning near baseline. No PT follow up needed after DC. No additional PT services needed at this time. PT signing off.      Follow Up Recommendations No PT follow up    Equipment Recommendations  None recommended by PT    Recommendations for Other Services       Precautions / Restrictions Precautions Precautions: Fall      Mobility  Bed Mobility               General bed mobility comments: Received sitting at EOB.   Transfers Overall transfer level: Modified independent Equipment used: None                Ambulation/Gait Ambulation/Gait assistance: Supervision Ambulation Distance (Feet): 350 Feet Assistive device: None Gait Pattern/deviations: Step-through pattern Gait velocity: 0.58m/s Gait velocity interpretation: <1.8 ft/sec, indicative of risk for recurrent falls General Gait Details: first 20' on  2L/min O2 94%; 2nd 160' on room air with SaO2: 90%, HR 116BPM.   Stairs            Wheelchair Mobility    Modified Rankin (Stroke Patients Only)       Balance Overall balance assessment: No apparent balance deficits (not formally assessed)                                           Pertinent Vitals/Pain Pain Assessment: No/denies pain    Home Living Family/patient expects to be discharged to:: Private residence Living Arrangements: Other (Comment) Biomedical scientist) Available Help at Discharge: Family (lots of family nearby) Type of Home: House Home Access: Stairs to enter Entrance Stairs-Rails: Can reach both Entrance Stairs-Number of Steps: 3 Home Layout: Two level;Full bath on main level;Able to live on main level with bedroom/bathroom Home Equipment: Bedside commode;Cane - single point      Prior Function Level of Independence: Independent         Comments: Performs all ADL independently; handles her medications and bills indep, still drives makes groceries, and cooks. Some difficulty with sustained community distances.      Hand Dominance        Extremity/Trunk Assessment                         Communication   Communication: No difficulties  Cognition Arousal/Alertness: Awake/alert Behavior During Therapy:  WFL for tasks assessed/performed Overall Cognitive Status: Within Functional Limits for tasks assessed                      General Comments      Exercises     Assessment/Plan    PT Assessment Patent does not need any further PT services  PT Problem List            PT Treatment Interventions      PT Goals (Current goals can be found in the Care Plan section)  Acute Rehab PT Goals PT Goal Formulation: All assessment and education complete, DC therapy    Frequency     Barriers to discharge        Co-evaluation               End of Session Equipment Utilized During Treatment: Gait  belt;Oxygen Activity Tolerance: Patient tolerated treatment well Patient left: in bed;with family/visitor present;Other (comment) (O2 doffed ) Nurse Communication: Mobility status (room air O2 sats)         Time: [id number] PT Time Calculation (min) (ACUTE ONLY): 15 min   Charges:   PT Evaluation $PT Eval Low Complexity: 1 Procedure PT Treatments $Therapeutic Activity: 8-22 mins   PT G Codes:       1:32 PM, 03/17/2016 Etta Grandchild, PT, DPT Physical Therapist - [place] [phone] (mobile)

## 2016-03-02 NOTE — Progress Notes (Signed)
ANTICOAGULATION CONSULT NOTE - Follow up  Pharmacy Consult for Warfarin  Indication: atrial fibrillation  Allergies  Allergen Reactions  . Ampicillin   . Penicillins   . Prinzide  [Lisinopril-Hydrochlorothiazide]    Patient Measurements: Height: 5\' 1"  (154.9 cm) Weight: 186 lb 11.2 oz (84.7 kg) IBW/kg (Calculated) : 47.8  Vital Signs: Temp: 98.1 F (36.7 C) (12/10 0726) Temp Source: Oral (12/10 0726) BP: 116/50 (12/10 0726) Pulse Rate: 68 (12/10 0726)  Labs:  Recent Labs  02/29/16 1744 03/01/16 0356 03/02/16 0359  HGB 13.5 12.9  --   HCT 39.9 37.9  --   PLT 186 164  --   LABPROT 20.0* 19.7* 23.2*  INR 1.68 1.65 2.02  CREATININE 0.61 0.64  --     Estimated Creatinine Clearance: 60 mL/min (by C-G formula based on SCr of 0.64 mg/dL).  Medical History: Past Medical History:  Diagnosis Date  . A-fib ([place])   . Hypertension    Medications:  Prescriptions Prior to Admission  Medication Sig Dispense Refill Last Dose  . doxycycline (VIBRA-TABS) 100 MG tablet Take 1 tablet (100 mg total) by mouth 2 (two) times daily. 14 tablet 0 02/29/2016 at 0715  . flecainide (TAMBOCOR) 50 MG tablet Take 50 mg by mouth daily.    02/29/2016 at [place]  . Fluticasone-Salmeterol (ADVAIR DISKUS IN) Inhale 1 puff into the lungs daily as needed.   02/29/2016 at [place]  . gabapentin (NEURONTIN) 100 MG capsule Take 1 capsule (100 mg total) by mouth 3 (three) times daily. 270 capsule 2 02/29/2016 at 0715  . hydrALAZINE (APRESOLINE) 25 MG tablet Take 1 tablet (25 mg total) by mouth 2 (two) times daily. 180 tablet 2 02/29/2016 at 0715  . metoprolol (LOPRESSOR) 50 MG tablet Take 50 mg by mouth.    02/28/2016 at Unknown time  . potassium chloride (K-DUR) 10 MEQ tablet Take 10 mEq by mouth daily.    02/29/2016 at [place]  . Pyridoxine HCl (VITAMIN B6) 200 MG TABS Take by mouth.   02/29/2016 at [place]  . triamterene-hydrochlorothiazide (MAXZIDE-25) 37.5-25 MG tablet TAKE 1 TABLET BY MOUTH EVERY DAY 30 tablet 6  02/28/2016 at Unknown time  . warfarin (COUMADIN) 2.5 MG tablet Take 2-2.5 mg by mouth every other day. Take 2mg  one day and take 2.5mg  next day, alternating.   02/28/2016 at Unknown time  . ALPRAZolam (XANAX) 0.5 MG tablet TAKE 1 TABLET BY MOUTH AT BEDTIME AS NEEDED FOR ANXIETY 30 tablet 5 prn at prn  . PROAIR HFA 108 (90 Base) MCG/ACT inhaler INHALE 2 INHALATIONS INTO THE LUNGS EVERY 6 (SIX) HOURS AS NEEDED FOR WHEEZING.  4 prn at prn    Assessment: Pharmacy consulted to dose warfarin for Afib in this 75 year old female. Pt was on Warfarin 2 mg every other day alternating with warfarin 2.5 mg every other day.   Last dose of warfarin prior to admission was 2 mg on 12/7. Of note, INR was subtherapeutic on admission.  Dosing history: Date INR Warfarin dose Comments/DDI 12/8 1.68 2.5mg    Levofloxacin started 12/9 1.65 3.5 mg   Levofloxacin 12/10 2.02  Goal of Therapy:  INR 2-3   Plan:  INR = 2.02 is therapeutic today with a significant increase from yesterday. Anticipate that INR will continue to increase tomorrow, possibly as a result of drug interaction with levofloxacin which can enhance the effects of warfarin.  Will give warfarin 2 mg home dose this evening and recheck INR tomorrow morning.  Lenis Noon, PharmD, BCPS  Clinical Pharmacist 03/02/2016,8:14 AM

## 2016-03-03 LAB — POTASSIUM: Potassium: 3.7 mmol/L (ref 3.5–5.1)

## 2016-03-03 MED ORDER — GUAIFENESIN-DM 100-10 MG/5ML PO SYRP: 5.0000 mL | ORAL_SOLUTION | ORAL | 0 refills | Status: DC | PRN

## 2016-03-03 MED ORDER — NYSTATIN 100000 UNIT/ML MT SUSP: 5.0000 mL | Freq: Four times a day (QID) | OROMUCOSAL | 0 refills | Status: DC

## 2016-03-03 MED ORDER — LEVOFLOXACIN 500 MG PO TABS: 500.0000 mg | ORAL_TABLET | Freq: Every day | ORAL | 0 refills | Status: DC

## 2016-03-03 MED ORDER — PREDNISONE 10 MG (21) PO TBPK: 10.0000 mg | ORAL_TABLET | Freq: Every day | ORAL | 0 refills | Status: DC

## 2016-03-03 NOTE — Discharge Summary (Signed)
[facility] [place] NAME: Hannah Faulkner    MR#:  [id number]  DATE OF BIRTH:  December 22, 1940  DATE OF ADMISSION:  02/29/2016   ADMITTING PHYSICIAN: Henreitta Leber, MD  DATE OF DISCHARGE: 03/03/2016  PRIMARY CARE PHYSICIAN: Wilhemena Durie, MD   ADMISSION DIAGNOSIS:  Community acquired pneumonia, unspecified laterality [J18.9] DISCHARGE DIAGNOSIS:  Active Problems:   Pneumonia  SECONDARY DIAGNOSIS:   Past Medical History:  Diagnosis Date  . A-fib ([place])   . Hypertension    HOSPITAL COURSE:  75 year old female with past medical history of atrial fibrillation, hypertension, anxiety, neuropathy Admitted to the hospital due to shortness of breath and cough and noted to have a pneumonia.  1. Pneumonia-community-acquired and multi lobar in nature. - cont. Levaquin, cultures so far (-).  Clinically improved. (no fever, leukocytosis)  2. Acute respiratory failure with hypoxia-secondary to pneumonia. - taper steroids on d/c  3. Essential hypertension - STABLE  4. History of chronic atrial fibrillation-rate controlled. Continue flecainide, metoprolol. -Continue warfarin, INR therapeutic  5. Anxiety-continue Xanax.  6. Neuropathy-continue gabapentin.  7. Hypokalemia - potassium replaced   DISCHARGE CONDITIONS:  STABLE CONSULTS OBTAINED:   DRUG ALLERGIES:   Allergies  Allergen Reactions  . Ampicillin   . Penicillins   . Prinzide  [Lisinopril-Hydrochlorothiazide]    DISCHARGE MEDICATIONS:     Medication List    STOP taking these medications   doxycycline 100 MG tablet Commonly known as:  VIBRA-TABS     TAKE these medications   ADVAIR DISKUS IN Inhale 1 puff into the lungs daily as needed.   ALPRAZolam 0.5 MG tablet Commonly known as:  XANAX TAKE 1 TABLET BY MOUTH AT BEDTIME AS NEEDED FOR ANXIETY   flecainide 50 MG tablet Commonly known as:  TAMBOCOR Take 50 mg by mouth daily.   gabapentin 100 MG  capsule Commonly known as:  NEURONTIN Take 1 capsule (100 mg total) by mouth 3 (three) times daily.   guaiFENesin-dextromethorphan 100-10 MG/5ML syrup Commonly known as:  ROBITUSSIN DM Take 5 mLs by mouth every 4 (four) hours as needed for cough.   hydrALAZINE 25 MG tablet Commonly known as:  APRESOLINE Take 1 tablet (25 mg total) by mouth 2 (two) times daily.   levofloxacin 500 MG tablet Commonly known as:  LEVAQUIN Take 1 tablet (500 mg total) by mouth daily at 6 PM.   metoprolol 50 MG tablet Commonly known as:  LOPRESSOR Take 50 mg by mouth.   nystatin 100000 UNIT/ML suspension Commonly known as:  MYCOSTATIN Use as directed 5 mLs (500,000 Units total) in the mouth or throat 4 (four) times daily.   potassium chloride 10 MEQ tablet Commonly known as:  K-DUR Take 10 mEq by mouth daily.   predniSONE 10 MG (21) Tbpk tablet Commonly known as:  STERAPRED UNI-PAK 21 TAB Take 1 tablet (10 mg total) by mouth daily. 60 mg once daily, taper 10 mg daily until done   PROAIR HFA 108 (90 Base) MCG/ACT inhaler Generic drug:  albuterol INHALE 2 INHALATIONS INTO THE LUNGS EVERY 6 (SIX) HOURS AS NEEDED FOR WHEEZING.   triamterene-hydrochlorothiazide 37.5-25 MG tablet Commonly known as:  MAXZIDE-25 TAKE 1 TABLET BY MOUTH EVERY DAY   Vitamin B6 200 MG Tabs Take by mouth.   warfarin 2.5 MG tablet Commonly known as:  COUMADIN Take 2-2.5 mg by mouth every other day. Take 2mg  one day and take 2.5mg  next day, alternating.      DISCHARGE INSTRUCTIONS:  DIET:  Regular diet DISCHARGE CONDITION:  Good ACTIVITY:  Activity as tolerated OXYGEN:  Home Oxygen: No.  Oxygen Delivery: room air DISCHARGE LOCATION:  home   If you experience worsening of your admission symptoms, develop shortness of breath, life threatening emergency, suicidal or homicidal thoughts you must seek medical attention immediately by calling 911 or calling your MD immediately  if symptoms less severe.  You Must  read complete instructions/literature along with all the possible adverse reactions/side effects for all the Medicines you take and that have been prescribed to you. Take any new Medicines after you have completely understood and accpet all the possible adverse reactions/side effects.   Please note  You were cared for by a hospitalist during your hospital stay. If you have any questions about your discharge medications or the care you received while you were in the hospital after you are discharged, you can call the unit and asked to speak with the hospitalist on call if the hospitalist that took care of you is not available. Once you are discharged, your primary care physician will handle any further medical issues. Please note that NO REFILLS for any discharge medications will be authorized once you are discharged, as it is imperative that you return to your primary care physician (or establish a relationship with a primary care physician if you do not have one) for your aftercare needs so that they can reassess your need for medications and monitor your lab values.    On the day of Discharge:  VITAL SIGNS:  Blood pressure (!) 141/57, pulse 72, temperature 98.4 F (36.9 C), temperature source Oral, resp. rate 19, height 5\' 1"  (1.549 m), weight 84.7 kg (186 lb 11.2 oz), SpO2 95 %. PHYSICAL EXAMINATION:  GENERAL:  75 y.o.-year-old patient lying in the bed with no acute distress.  EYES: Pupils equal, round, reactive to light and accommodation. No scleral icterus. Extraocular muscles intact.  HEENT: Head atraumatic, normocephalic. Oropharynx and nasopharynx clear.  NECK:  Supple, no jugular venous distention. No thyroid enlargement, no tenderness.  LUNGS: Normal breath sounds bilaterally, no wheezing, rales,rhonchi or crepitation. No use of accessory muscles of respiration.  CARDIOVASCULAR: S1, S2 normal. No murmurs, rubs, or gallops.  ABDOMEN: Soft, non-tender, non-distended. Bowel sounds present.  No organomegaly or mass.  EXTREMITIES: No pedal edema, cyanosis, or clubbing.  NEUROLOGIC: Cranial nerves II through XII are intact. Muscle strength 5/5 in all extremities. Sensation intact. Gait not checked.  PSYCHIATRIC: The patient is alert and oriented x 3.  SKIN: No obvious rash, lesion, or ulcer.  DATA REVIEW:   CBC  Recent Labs Lab 03/01/16 0356  WBC 4.9  HGB 12.9  HCT 37.9  PLT 164    Chemistries   Recent Labs Lab 02/29/16 1744 03/01/16 0356  03/03/16 0316  NA 141 139  --   --   K 3.0* 3.3*  < > 3.7  CL 104 104  --   --   CO2 27 28  --   --   GLUCOSE 107* 213*  --   --   BUN 10 10  --   --   CREATININE 0.61 0.64  --   --   CALCIUM 8.9 8.5*  --   --   AST 34  --   --   --   ALT 26  --   --   --   ALKPHOS 65  --   --   --   BILITOT 1.1  --   --   --   < > =  values in this interval not displayed.   Follow-up Information    Wilhemena Durie, MD. Schedule an appointment as soon as possible for a visit in 1 week(s).   Specialty:  Family Medicine Contact information: [address] [place] [address] [phone]            Management plans discussed with the patient, family and they are in agreement.  CODE STATUS: FULL CODE   TOTAL TIME TAKING CARE OF THIS PATIENT: 45 minutes.    Max Sane M.D on 03/03/2016 at 7:22 AM  Between 7am to 6pm - Pager - [phone]  After 6pm go to [email or web address] - password [email or web address] [facility]  [facility]  Office  [phone]  CC: Primary care physician; Wilhemena Durie, MD   Note: This dictation was prepared with Dragon dictation along with smaller phrase technology. Any transcriptional errors that result from this process are unintentional.

## 2016-03-03 NOTE — Care Management Important Message (Signed)
Important Message  Patient Details  Name: Hannah Faulkner MRN: [id number] Date of Birth: 19-Dec-1940   Medicare Important Message Given:  Yes    Marshell Garfinkel, RN 03/03/2016, 8:52 AM

## 2016-03-03 NOTE — Care Management (Signed)
Spoke with patient prior to discharge to home today. She states she is attempted to contact her son (at work) to pick her up. She states she is independent with mobility and not on home O2. She states she is current with her PCP. She states she is usually able to drive to her appointments. She denies having problems paying/obtainig her medications.

## 2016-03-03 NOTE — Discharge Instructions (Signed)
Community-Acquired Pneumonia, Adult °Introduction °Pneumonia is an infection of the lungs. One type of pneumonia can happen while a person is in a hospital. A different type can happen when a person is not in a hospital (community-acquired pneumonia). It is easy for this kind to spread from person to person. It can spread to you if you breathe near an infected person who coughs or sneezes. Some symptoms include: °· A dry cough. °· A wet (productive) cough. °· Fever. °· Sweating. °· Chest pain. °Follow these instructions at home: °· Take over-the-counter and prescription medicines only as told by your doctor. °¨ Only take cough medicine if you are losing sleep. °¨ If you were prescribed an antibiotic medicine, take it as told by your doctor. Do not stop taking the antibiotic even if you start to feel better. °· Sleep with your head and neck raised (elevated). You can do this by putting a few pillows under your head, or you can sleep in a recliner. °· Do not use tobacco products. These include cigarettes, chewing tobacco, and e-cigarettes. If you need help quitting, ask your doctor. °· Drink enough water to keep your pee (urine) clear or pale yellow. °A shot (vaccine) can help prevent pneumonia. Shots are often suggested for: °· People older than 75 years of age. °· People older than 75 years of age: °¨ Who are having cancer treatment. °¨ Who have long-term (chronic) lung disease. °¨ Who have problems with their body's defense system (immune system). °You may also prevent pneumonia if you take these actions: °· Get the flu (influenza) shot every year. °· Go to the dentist as often as told. °· Wash your hands often. If soap and water are not available, use hand sanitizer. °Contact a doctor if: °· You have a fever. °· You lose sleep because your cough medicine does not help. °Get help right away if: °· You are short of breath and it gets worse. °· You have more chest pain. °· Your sickness gets worse. This is very  serious if: °¨ You are an older adult. °¨ Your body's defense system is weak. °· You cough up blood. °This information is not intended to replace advice given to you by your health care provider. Make sure you discuss any questions you have with your health care provider. °Document Released: 08/27/2007 Document Revised: 08/16/2015 Document Reviewed: 07/05/2014 °© 2017 Elsevier ° °

## 2016-03-03 NOTE — Progress Notes (Signed)
Jarold Motto Conklin to be D/C'd Home per MD order.  Discussed with the patient and all questions fully answered.  VSS, Skin clean, dry and intact without evidence of skin break down, no evidence of skin tears noted. IV catheter discontinued intact. Site without signs and symptoms of complications. Dressing and pressure applied.  An After Visit Summary was printed and given to the patient. Patient received prescription.  D/c education completed with patient/family including follow up instructions, medication list, d/c activities limitations if indicated, with other d/c instructions as indicated by MD - patient able to verbalize understanding, all questions fully answered.   Patient instructed to return to ED, call 911, or call MD for any changes in condition.   Patient escorted via [place], and D/C home via private auto.  Deri Fuelling 03/03/2016 10:21 AM

## 2016-03-04 ENCOUNTER — Telehealth: Payer: Self-pay

## 2016-03-04 NOTE — Telephone Encounter (Signed)
Transition Care Management Follow-up Telephone Call    Date discharged? 03/03/16  How have you been since you were released from the hospital? Still experiencing weakness. Pt states she is coughing up more mucous today. The color varies from a dark brownish-yellow to a light green.  Any patient concerns? None   Items Reviewed:  Medications reviewed: Yes  Allergies reviewed: Yes  Dietary changes reviewed: N/A  Referrals reviewed: None   Functional Questionnaire:  Independent - I Dependent - D    Activities of Daily Living (ADLs):    Personal hygiene - I Dressing - I Eating - I Maintaining continence - I Transferring - I   Independent Activities of Daily Living (iADLs): Basic communication skills - I Transportation - I Meal preparation  - I Shopping - I Housework - I Managing medications - I  Managing personal finances - I   Confirmed importance and date/time of follow-up visits scheduled YES  Provider Appointment booked with PCP 03/10/16 @ 11:15 AM.  Confirmed with patient if condition begins to worsen call PCP or go to the ER.  Patient was given the office number and encouraged to call back with question or concerns: YES

## 2016-03-05 LAB — CULTURE, BLOOD (ROUTINE X 2)
CULTURE: NO GROWTH
Culture: NO GROWTH

## 2016-03-10 ENCOUNTER — Ambulatory Visit
Admission: RE | Admit: 2016-03-10 | Discharge: 2016-03-10 | Disposition: A | Discharge status: Home / Self Care | Payer: Medicare Other | Source: Ambulatory Visit | Attending: Family Medicine | Admitting: Family Medicine

## 2016-03-10 ENCOUNTER — Ambulatory Visit (INDEPENDENT_AMBULATORY_CARE_PROVIDER_SITE_OTHER): Payer: Medicare Other | Admitting: Family Medicine

## 2016-03-10 ENCOUNTER — Encounter: Payer: Self-pay | Admitting: Family Medicine

## 2016-03-10 VITALS — BP 162/70 | HR 72 | Temp 98.3°F | Resp 18 | Wt 187.0 lb

## 2016-03-10 DIAGNOSIS — J189 Pneumonia, unspecified organism: Secondary | ICD-10-CM | POA: Diagnosis not present

## 2016-03-10 DIAGNOSIS — R05 Cough: Secondary | ICD-10-CM

## 2016-03-10 DIAGNOSIS — R059 Cough, unspecified: Secondary | ICD-10-CM

## 2016-03-10 DIAGNOSIS — I517 Cardiomegaly: Secondary | ICD-10-CM | POA: Insufficient documentation

## 2016-03-10 MED ORDER — LEVOFLOXACIN 500 MG PO TABS: 500.0000 mg | ORAL_TABLET | Freq: Every day | ORAL | 0 refills | Status: DC

## 2016-03-10 NOTE — Progress Notes (Signed)
Subjective:  HPI Pt is here for a hospital follow up from Pneumonia. Hospital stay was from 02/29/16-03/03/16. She was given steroid injections and potassium replaced. Stopped doxy and started Levaquin and prednisone taper upon discharge. Pt reports that she thinks she may need another round of antibiotics. She is feeling better but is still coughing and having nasal congestion. She is also suppose to have cataract surgery on January 2nd wants to try and get well, so that she does not have to change her appointment.   Prior to Admission medications   Medication Sig Start Date End Date Taking? Authorizing Provider  ALPRAZolam Duanne Moron) 0.5 MG tablet TAKE 1 TABLET BY MOUTH AT BEDTIME AS NEEDED FOR ANXIETY 01/08/16   Jerrol Banana., MD  flecainide (TAMBOCOR) 50 MG tablet Take 50 mg by mouth daily.  11/16/13   Historical Provider, MD  Fluticasone-Salmeterol (ADVAIR DISKUS IN) Inhale 1 puff into the lungs daily as needed.    Historical Provider, MD  gabapentin (NEURONTIN) 100 MG capsule Take 1 capsule (100 mg total) by mouth 3 (three) times daily. 01/30/16   Richard Maceo Pro., MD  guaiFENesin-dextromethorphan (ROBITUSSIN DM) 100-10 MG/5ML syrup Take 5 mLs by mouth every 4 (four) hours as needed for cough. 03/03/16   Max Sane, MD  hydrALAZINE (APRESOLINE) 25 MG tablet Take 1 tablet (25 mg total) by mouth 2 (two) times daily. 01/30/16   Richard Maceo Pro., MD  levofloxacin (LEVAQUIN) 500 MG tablet Take 1 tablet (500 mg total) by mouth daily at 6 PM. 03/03/16   Max Sane, MD  metoprolol (LOPRESSOR) 50 MG tablet Take 50 mg by mouth.  11/16/13   Historical Provider, MD  nystatin (MYCOSTATIN) 100000 UNIT/ML suspension Use as directed 5 mLs (500,000 Units total) in the mouth or throat 4 (four) times daily. 03/03/16   Max Sane, MD  potassium chloride (K-DUR) 10 MEQ tablet Take 10 mEq by mouth daily.  11/16/13   Historical Provider, MD  predniSONE (STERAPRED UNI-PAK 21 TAB) 10 MG (21) TBPK tablet  Take 1 tablet (10 mg total) by mouth daily. 60 mg once daily, taper 10 mg daily until done 03/03/16   Max Sane, MD  PROAIR HFA 108 [id number] Base) MCG/ACT inhaler INHALE 2 INHALATIONS INTO THE LUNGS EVERY 6 (SIX) HOURS AS NEEDED FOR WHEEZING. 04/04/15   Historical Provider, MD  Pyridoxine HCl (VITAMIN B6) 200 MG TABS Take by mouth. 11/16/13   Historical Provider, MD  triamterene-hydrochlorothiazide (MAXZIDE-25) 37.5-25 MG tablet TAKE 1 TABLET BY MOUTH EVERY DAY 07/31/15   Jerrol Banana., MD  warfarin (COUMADIN) 2.5 MG tablet Take 2-2.5 mg by mouth every other day. Take 2mg  one day and take 2.5mg  next day, alternating. 11/16/13   Historical Provider, MD    Patient Active Problem List   Diagnosis Date Noted  . Pneumonia 02/29/2016  . Tricuspid regurgitation 05/15/2015  . Mitral valve regurgitation 05/15/2015  . Anxiety 08/25/2014  . A-fib ([place]) 08/25/2014  . Benign neoplasm of colon 08/25/2014  . 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 Diabetic neuropathy ([place]) 08/25/2014  . Essential (primary) hypertension 08/25/2014  . 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 Adiposity 08/25/2014  . Obstructive apnea 08/25/2014  . Hypercholesterolemia without hypertriglyceridemia 08/25/2014  . B12 deficiency 08/25/2014  . Type 2 diabetes mellitus ([place]) 08/25/2014  . H/O cardiac catheterization 07/15/2013  . BP (high blood pressure) 07/15/2013  . Apnea, sleep 07/15/2013  . AF (paroxysmal atrial  fibrillation) ([place]) 07/15/2013    Past Medical History:  Diagnosis Date  . A-fib ([place])   . Hypertension     Social History   Social History  . Marital status: Widowed    Spouse name: N/A  . Number of children: N/A  . Years of education: N/A   Occupational History  . Not on file.   Social History Main Topics  . Smoking status: Never Smoker  . Smokeless tobacco: Never Used  . Alcohol use No  . Drug use: No  . Sexual activity: Not on  file   Other Topics Concern  . Not on file   Social History Narrative  . No narrative on file    Allergies  Allergen Reactions  . Ampicillin   . Penicillins   . Prinzide  [Lisinopril-Hydrochlorothiazide]     Review of Systems  Constitutional: Positive for chills and malaise/fatigue.  HENT: Positive for congestion.   Eyes: Negative.   Respiratory: Positive for cough, sputum production and shortness of breath. Negative for wheezing.   Cardiovascular: Negative.   Gastrointestinal: Negative.   Genitourinary: Negative.   Musculoskeletal: Negative.   Skin: Negative.   Neurological: Positive for weakness.  Endo/Heme/Allergies: Negative.   Psychiatric/Behavioral: Negative.     Immunization History  Administered Date(s) Administered  . Influenza, High Dose Seasonal PF 01/17/2015, 01/10/2016  . Pneumococcal Conjugate-13 02/08/2014  . Pneumococcal Polysaccharide-23 01/30/2002, 01/21/2012  . Td 10/17/2009  . Tdap 01/21/2012  . Zoster 02/21/2013    Objective:  BP (!) 162/70 (BP Location: Left Arm, Patient Position: Sitting, Cuff Size: Normal)   Pulse 72   Temp 98.3 F (36.8 C) (Oral)   Resp 18   Wt 187 lb (84.8 kg)   SpO2 95%   BMI 35.33 kg/m   Physical Exam  Constitutional: She is oriented to person, place, and time and well-developed, well-nourished, and in no distress.  HENT:  Head: Normocephalic and atraumatic.  Right Ear: External ear normal.  Left Ear: External ear normal.  Nose: Nose normal.  Eyes: Conjunctivae and EOM are normal. Pupils are equal, round, and reactive to light.  Neck: Normal range of motion. Neck supple.  Cardiovascular: Normal rate, regular rhythm, normal heart sounds and intact distal pulses.   Pulmonary/Chest: Effort normal and breath sounds normal.  Abdominal: Soft.  Musculoskeletal: Normal range of motion.  Neurological: She is alert and oriented to person, place, and time. She has normal reflexes. Gait normal. GCS score is 15.  Skin:  Skin is warm and dry.  Psychiatric: Mood, memory, affect and judgment normal.    Lab Results  Component Value Date   WBC 4.9 03/01/2016   HGB 12.9 03/01/2016   HCT 37.9 03/01/2016   PLT 164 03/01/2016   GLUCOSE 213 (H) 03/01/2016   CHOL 148 11/27/2015   TRIG 109 11/27/2015   HDL 42 11/27/2015   LDLCALC 84 11/27/2015   TSH 1.350 11/27/2015   INR 2.02 03/02/2016   HGBA1C 5.7 (H) 11/27/2015   MICROALBUR 50 11/12/2015    CMP     Component Value Date/Time   NA 139 03/01/2016 0356   NA 143 11/27/2015 0953   K 3.7 03/03/2016 0316   CL 104 03/01/2016 0356   CO2 28 03/01/2016 0356   GLUCOSE 213 (H) 03/01/2016 0356   BUN 10 03/01/2016 0356   BUN 16 11/27/2015 0953   CREATININE 0.64 03/01/2016 0356   CALCIUM 8.5 (L) 03/01/2016 0356   PROT 7.8 02/29/2016 1744   PROT 6.8 11/27/2015 [id number]  ALBUMIN 4.0 02/29/2016 1744   ALBUMIN 4.0 11/27/2015 0953   AST 34 02/29/2016 1744   ALT 26 02/29/2016 1744   ALKPHOS 65 02/29/2016 1744   BILITOT 1.1 02/29/2016 1744   BILITOT 0.8 11/27/2015 0953   GFRNONAA >60 03/01/2016 0356   GFRAA >60 03/01/2016 0356    Assessment and Plan :  1. Pneumonia due to infectious organism, unspecified laterality, unspecified part of lung Patient is markedly improved but will repeat chest x-ray. Extension treatment another 5 days. - levofloxacin (LEVAQUIN) 500 MG tablet; Take 1 tablet (500 mg total) by mouth daily.  Dispense: 5 tablet; Refill: 0 - DG Chest 2 View; Future  2. Cough  - DG Chest 2 View; Future  3.TIIDM  HPI, Exam, and A&P Transcribed under the direction and in the presence of Richard L. Cranford Mon, MD  Electronically Signed: Webb Laws, CMA I have done the exam and reviewed the above chart and it is accurate to the best of my knowledge. Development worker, community has been used in this note in any air is in the dictation or transcription are unintentional.  [facility] 03/10/2016  11:25 AM

## 2016-04-03 ENCOUNTER — Ambulatory Visit (INDEPENDENT_AMBULATORY_CARE_PROVIDER_SITE_OTHER): Payer: Medicare Other | Admitting: Family Medicine

## 2016-04-03 ENCOUNTER — Ambulatory Visit
Admission: RE | Admit: 2016-04-03 | Discharge: 2016-04-03 | Disposition: A | Discharge status: Home / Self Care | Payer: Medicare Other | Source: Ambulatory Visit | Attending: Family Medicine | Admitting: Family Medicine

## 2016-04-03 ENCOUNTER — Encounter: Payer: Self-pay | Admitting: Family Medicine

## 2016-04-03 VITALS — BP 162/70 | HR 92 | Temp 98.4°F | Resp 16 | Wt 190.0 lb

## 2016-04-03 DIAGNOSIS — J181 Lobar pneumonia, unspecified organism: Secondary | ICD-10-CM

## 2016-04-03 DIAGNOSIS — E119 Type 2 diabetes mellitus without complications: Secondary | ICD-10-CM

## 2016-04-03 DIAGNOSIS — I517 Cardiomegaly: Secondary | ICD-10-CM | POA: Insufficient documentation

## 2016-04-03 DIAGNOSIS — J189 Pneumonia, unspecified organism: Secondary | ICD-10-CM

## 2016-04-03 DIAGNOSIS — R05 Cough: Secondary | ICD-10-CM | POA: Diagnosis not present

## 2016-04-03 DIAGNOSIS — R079 Chest pain, unspecified: Secondary | ICD-10-CM | POA: Diagnosis not present

## 2016-04-03 LAB — POCT GLYCOSYLATED HEMOGLOBIN (HGB A1C)
Est. average glucose Bld gHb Est-mCnc: 126
Hemoglobin A1C: 6

## 2016-04-03 NOTE — Progress Notes (Signed)
Patient: Hannah Faulkner Female    DOB: 1940-08-01   76 y.o.   MRN: [id number] Visit Date: 04/03/2016  Today's Provider: Wilhemena Durie, MD   Chief Complaint  Patient presents with  . Pneumonia    FU   Subjective:    HPI     Follow up for Pneumomnia  The patient was last seen for this 4 weeks ago. Changes made at last visit include adding 5 more days of Levaquin.  She reports good compliance with treatment. She feels that condition is Improved. Pt does report she is still experiencing some cough. Energy is slowly returning.  ------------------------------------------------------------------------------------ Patient also needs follow-up for diabetes/prediabetes. This was last done in July of last year.   Allergies  Allergen Reactions  . Ampicillin   . Penicillins   . Prinzide  [Lisinopril-Hydrochlorothiazide]      Current Outpatient Prescriptions:  .  ALPRAZolam (XANAX) 0.5 MG tablet, TAKE 1 TABLET BY MOUTH AT BEDTIME AS NEEDED FOR ANXIETY, Disp: 30 tablet, Rfl: 5 .  cyanocobalamin 1000 MCG tablet, Take 1,000 mcg by mouth daily., Disp: , Rfl:  .  flecainide (TAMBOCOR) 50 MG tablet, Take 50 mg by mouth daily. , Disp: , Rfl:  .  gabapentin (NEURONTIN) 100 MG capsule, Take 1 capsule (100 mg total) by mouth 3 (three) times daily., Disp: 270 capsule, Rfl: 2 .  guaiFENesin-dextromethorphan (ROBITUSSIN DM) 100-10 MG/5ML syrup, Take 5 mLs by mouth every 4 (four) hours as needed for cough., Disp: 118 mL, Rfl: 0 .  hydrALAZINE (APRESOLINE) 25 MG tablet, Take 1 tablet (25 mg total) by mouth 2 (two) times daily., Disp: 180 tablet, Rfl: 2 .  metoprolol (LOPRESSOR) 50 MG tablet, Take 50 mg by mouth. , Disp: , Rfl:  .  potassium chloride (K-DUR) 10 MEQ tablet, Take 10 mEq by mouth daily. , Disp: , Rfl:  .  Pyridoxine HCl (VITAMIN B6) 200 MG TABS, Take by mouth., Disp: , Rfl:  .  triamterene-hydrochlorothiazide ([id number]) 37.5-25 MG tablet, TAKE 1 TABLET BY MOUTH EVERY  DAY, Disp: 30 tablet, Rfl: 6 .  warfarin (COUMADIN) 2.5 MG tablet, Take 2-2.5 mg by mouth every other day. Take 2mg  one day and take 2.5mg  next day, alternating., Disp: , Rfl:  .  nystatin (MYCOSTATIN) 100000 UNIT/ML suspension, Use as directed 5 mLs (500,000 Units total) in the mouth or throat 4 (four) times daily. (Patient not taking: Reported on 04/03/2016), Disp: 60 mL, Rfl: 0 .  PROAIR HFA 108 (90 Base) MCG/ACT inhaler, INHALE 2 INHALATIONS INTO THE LUNGS EVERY 6 (SIX) HOURS AS NEEDED FOR WHEEZING., Disp: , Rfl: 4  Review of Systems  Constitutional: Negative for activity change, appetite change, chills, diaphoresis, fatigue, fever and unexpected weight change.  HENT: Negative.   Respiratory: Positive for cough. Negative for chest tightness, shortness of breath and wheezing.   Cardiovascular: Positive for palpitations (a fib). Negative for chest pain and leg swelling.  Endocrine: Negative.   Musculoskeletal: Positive for back pain (from coughing).  Allergic/Immunologic: Negative.   Neurological: Negative.   Hematological: Negative.   Psychiatric/Behavioral: Negative.     Social History  Substance Use Topics  . Smoking status: Never Smoker  . Smokeless tobacco: Never Used  . Alcohol use No   Objective:   BP (!) 162/70 (BP Location: Left Arm, Patient Position: Sitting, Cuff Size: Large)   Pulse 92   Temp 98.4 F (36.9 C) (Oral)   Resp 16   Wt 190 lb (86.2 kg)  SpO2 94%   BMI 35.90 kg/m   Physical Exam  Constitutional: She is oriented to person, place, and time. She appears well-developed and well-nourished.  HENT:  Head: Normocephalic and atraumatic.  Eyes: Conjunctivae are normal.  Neck: No thyromegaly present.  Cardiovascular: Normal rate, regular rhythm and normal heart sounds.   Pulmonary/Chest: Effort normal and breath sounds normal. No respiratory distress. She has no wheezes. She has no rales.  Abdominal: Soft.  Neurological: She is alert and oriented to person,  place, and time.  Skin: Skin is warm and dry.  Psychiatric: She has a normal mood and affect. Her behavior is normal.        Assessment & Plan:     1. Pneumonia of right upper lobe due to infectious organism [facility]) Improving. Will order CXR. FU pending results. Advised pt to hold off on cataract extraction until February. - DG Chest 2 View  2. Type 2 diabetes mellitus without complication, without long-term current use of insulin (HCC) Stable. Recheck 4 months. - POCT glycosylated hemoglobin (Hb A1C)  Results for orders placed or performed in visit on 04/03/16  POCT glycosylated hemoglobin (Hb A1C)  Result Value Ref Range   Hemoglobin A1C 6.0    Est. average glucose Bld gHb Est-mCnc 126        Patient seen and examined by Miguel Aschoff, MD, and note scribed by Renaldo Fiddler, CMA.   Richard Cranford Mon, MD  [facility]

## 2016-04-04 NOTE — Progress Notes (Signed)
Patient Advised ED

## 2016-04-07 ENCOUNTER — Other Ambulatory Visit: Payer: Self-pay | Admitting: Family Medicine

## 2016-04-14 ENCOUNTER — Ambulatory Visit: Payer: Self-pay | Admitting: Family Medicine

## 2016-04-15 DIAGNOSIS — I480 Paroxysmal atrial fibrillation: Secondary | ICD-10-CM | POA: Diagnosis not present

## 2016-04-15 DIAGNOSIS — G4733 Obstructive sleep apnea (adult) (pediatric): Secondary | ICD-10-CM | POA: Diagnosis not present

## 2016-04-15 DIAGNOSIS — Z9889 Other specified postprocedural states: Secondary | ICD-10-CM | POA: Diagnosis not present

## 2016-04-15 DIAGNOSIS — E7800 Pure hypercholesterolemia, unspecified: Secondary | ICD-10-CM | POA: Diagnosis not present

## 2016-04-15 DIAGNOSIS — I10 Essential (primary) hypertension: Secondary | ICD-10-CM | POA: Diagnosis not present

## 2016-05-13 DIAGNOSIS — I480 Paroxysmal atrial fibrillation: Secondary | ICD-10-CM | POA: Diagnosis not present

## 2016-05-14 ENCOUNTER — Ambulatory Visit (INDEPENDENT_AMBULATORY_CARE_PROVIDER_SITE_OTHER): Payer: Medicare Other | Admitting: Family Medicine

## 2016-05-14 ENCOUNTER — Encounter: Payer: Self-pay | Admitting: Family Medicine

## 2016-05-14 VITALS — BP 118/60 | HR 80 | Temp 97.6°F | Resp 16 | Ht 59.5 in | Wt 191.0 lb

## 2016-05-14 DIAGNOSIS — M75102 Unspecified rotator cuff tear or rupture of left shoulder, not specified as traumatic: Secondary | ICD-10-CM

## 2016-05-14 DIAGNOSIS — I482 Chronic atrial fibrillation, unspecified: Secondary | ICD-10-CM

## 2016-05-14 DIAGNOSIS — E0842 Diabetes mellitus due to underlying condition with diabetic polyneuropathy: Secondary | ICD-10-CM | POA: Diagnosis not present

## 2016-05-14 DIAGNOSIS — E1142 Type 2 diabetes mellitus with diabetic polyneuropathy: Secondary | ICD-10-CM | POA: Diagnosis not present

## 2016-05-14 DIAGNOSIS — Z0000 Encounter for general adult medical examination without abnormal findings: Secondary | ICD-10-CM | POA: Diagnosis not present

## 2016-05-14 DIAGNOSIS — Z1231 Encounter for screening mammogram for malignant neoplasm of breast: Secondary | ICD-10-CM | POA: Diagnosis not present

## 2016-05-14 DIAGNOSIS — F419 Anxiety disorder, unspecified: Secondary | ICD-10-CM | POA: Diagnosis not present

## 2016-05-14 MED ORDER — GABAPENTIN 100 MG PO CAPS: 200.0000 mg | ORAL_CAPSULE | Freq: Three times a day (TID) | ORAL | 3 refills | Status: DC

## 2016-05-14 NOTE — Progress Notes (Signed)
Patient: Hannah Faulkner, Female    DOB: 02-Dec-1940, 76 y.o.   MRN: [id number] Visit Date: 05/14/2016  Today's Provider: Wilhemena Durie, MD   Chief Complaint  Patient presents with  . Medicare Wellness   Subjective:    Annual wellness visit Hannah Faulkner is a 76 y.o. female. She feels fairly well. Pt is c/o feet neuropathy, and is asking if there is an alternative to the gabapentin she is taking. She reports she is not exercising due to neuropathy. She reports she is sleeping fairly well. She does report pain with abducting/lifting her left arm above her head. She also complains of nighttime tingling and burning sensation in her feet and they feel cold.  ----------------------------------------------------------- Last colonoscopy- 03/03/2013- tubular adenoma. Repeat 5 years. Last BMD- 02/24/2013- normal. Last mammogram- 07/13/2014.  Review of Systems  Constitutional: Negative.   HENT: Negative.   Eyes: Negative.   Respiratory: Negative.   Cardiovascular: Negative.   Gastrointestinal: Negative.   Endocrine: Negative.   Genitourinary: Negative.   Musculoskeletal: Negative.   Skin: Negative.   Allergic/Immunologic: Negative.   Neurological: Negative.        Tingling in feet.  Hematological: Negative.   Psychiatric/Behavioral: Negative.     Social History   Social History  . Marital status: Widowed    Spouse name: N/A  . Number of children: 3  . Years of education: HS   Occupational History  .  Retired   Social History Main Topics  . Smoking status: Never Smoker  . Smokeless tobacco: Never Used  . Alcohol use No  . Drug use: No  . Sexual activity: Not on file   Other Topics Concern  . Not on file   Social History Narrative  . No narrative on file    Past Medical History:  Diagnosis Date  . A-fib ([place])   . Hypertension      Patient Active Problem List   Diagnosis Date Noted  . Pneumonia 02/29/2016  . Tricuspid regurgitation 05/15/2015   . Mitral valve regurgitation 05/15/2015  . Anxiety 08/25/2014  . A-fib ([place]) 08/25/2014  . Benign neoplasm of colon 08/25/2014  . 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 Diabetic neuropathy ([place]) 08/25/2014  . Essential (primary) hypertension 08/25/2014  . 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 Adiposity 08/25/2014  . Obstructive apnea 08/25/2014  . Hypercholesterolemia without hypertriglyceridemia 08/25/2014  . B12 deficiency 08/25/2014  . Type 2 diabetes mellitus ([place]) 08/25/2014  . H/O cardiac catheterization 07/15/2013  . BP (high blood pressure) 07/15/2013  . Apnea, sleep 07/15/2013  . AF (paroxysmal atrial fibrillation) ([place]) 07/15/2013    Past Surgical History:  Procedure Laterality Date  . EYE SURGERY    . POLYPECTOMY     colon poly premoved    Her family history includes Atrial fibrillation in her sister; CVA in her mother and sister; Dementia in her sister; Diabetes in her sister; Heart attack in her mother and sister; Heart disease in her mother; Hypertension in her mother; Liver cancer in her brother; Lung cancer in her father; Thyroid disease in her sister, sister, and sister.      Current Outpatient Prescriptions:  .  ALPRAZolam (XANAX) 0.5 MG tablet, TAKE 1 TABLET BY MOUTH AT BEDTIME AS NEEDED FOR ANXIETY (Patient taking differently: TAKE 1/2-1 TABLET BY MOUTH AT BEDTIME AS NEEDED FOR ANXIETY), Disp: 30 tablet, Rfl: 5 .  cyanocobalamin 1000 MCG tablet, Take 1,000  mcg by mouth daily., Disp: , Rfl:  .  flecainide (TAMBOCOR) 50 MG tablet, Take 50 mg by mouth daily. , Disp: , Rfl:  .  gabapentin (NEURONTIN) 100 MG capsule, Take 1 capsule (100 mg total) by mouth 3 (three) times daily., Disp: 270 capsule, Rfl: 2 .  hydrALAZINE (APRESOLINE) 25 MG tablet, TAKE 1 TABLET BY MOUTH 3 TIMES A DAY, Disp: 90 tablet, Rfl: 3 .  metoprolol (LOPRESSOR) 50 MG tablet, Take 50 mg by mouth. , Disp: , Rfl:  .  potassium  chloride (K-DUR) 10 MEQ tablet, Take 10 mEq by mouth daily. , Disp: , Rfl:  .  Pyridoxine HCl (VITAMIN B6) 200 MG TABS, Take by mouth., Disp: , Rfl:  .  triamterene-hydrochlorothiazide ([id number]) 37.5-25 MG tablet, TAKE 1 TABLET BY MOUTH EVERY DAY, Disp: 30 tablet, Rfl: 6 .  warfarin (COUMADIN) 2.5 MG tablet, Take 2-2.5 mg by mouth every other day. Take 2mg  one day and take 2.5mg  next day, alternating., Disp: , Rfl:  .  PROAIR HFA 108 (90 Base) MCG/ACT inhaler, INHALE 2 INHALATIONS INTO THE LUNGS EVERY 6 (SIX) HOURS AS NEEDED FOR WHEEZING., Disp: , Rfl: 4  Patient Care Team: Jerrol Banana., MD as PCP - General (Family Medicine)     Objective:   Vitals: BP 118/60 (BP Location: Right Arm, Patient Position: Sitting, Cuff Size: Large)   Pulse 80 Comment: irregular  Temp 97.6 F (36.4 C) (Oral)   Resp 16   Ht 4' 11.5" (1.511 m)   Wt 191 lb (86.6 kg)   BMI 37.93 kg/m   Physical Exam  Constitutional: She is oriented to person, place, and time. She appears well-developed and well-nourished.  HENT:  Head: Normocephalic and atraumatic.  Right Ear: Tympanic membrane, external ear and ear canal normal.  Left Ear: Tympanic membrane, external ear and ear canal normal.  Nose: Nose normal.  Mouth/Throat: Uvula is midline, oropharynx is clear and moist and mucous membranes are normal.  Eyes: Conjunctivae, EOM and lids are normal. Pupils are equal, round, and reactive to light.  Neck: Trachea normal and normal range of motion. Neck supple. Carotid bruit is not present. No thyroid mass and no thyromegaly present.  Cardiovascular: Normal rate, regular rhythm and normal heart sounds.   Spider varicose veins of lower extremities.  Pulmonary/Chest: Effort normal and breath sounds normal.  Abdominal: Soft. Normal appearance and bowel sounds are normal. There is no hepatosplenomegaly. There is no tenderness.  Genitourinary: No breast swelling, tenderness or discharge.  Musculoskeletal: Normal  range of motion.  Mild pain to the left shoulder. Discomfort with abduction past 90 of the left shoulder  Lymphadenopathy:    She has no cervical adenopathy.    She has no axillary adenopathy.  Neurological: She is alert and oriented to person, place, and time. She has normal strength. She exhibits normal muscle tone. Coordination normal.  Decreased sensation in toes. Otherwise neurosensory exam of the feet is normal.  Skin: Skin is warm, dry and intact.  Psychiatric: She has a normal mood and affect. Her speech is normal and behavior is normal. Judgment and thought content normal. Cognition and memory are normal.    Activities of Daily Living In your present state of health, do you have any difficulty performing the following activities: 05/14/2016 02/29/2016  Hearing? N N  Vision? Y N  Difficulty concentrating or making decisions? N N  Walking or climbing stairs? N Y  Dressing or bathing? N N  Doing errands, shopping? N N  Some recent data might be hidden    Fall Risk Assessment Fall Risk  05/14/2016 10/11/2014  Falls in the past year? No No     Depression Screen PHQ 2/9 Scores 05/14/2016 10/11/2014  PHQ - 2 Score 0 0    Cognitive Testing - 6-CIT  Correct? Score   What year is it? yes 0 0 or 4  What month is it? yes 0 0 or 3  Memorize:    Pia Mau,  42,  [address],  [place],      What time is it? (within 1 hour) yes 0 0 or 3  Count backwards from 20 yes 0 0, 2, or 4  Name the months of the year yes 0 0, 2, or 4  Repeat name & address above yes 0 0, 2, 4, 6, 8, or 10       TOTAL SCORE  0/28   Interpretation:  Normal  Normal (0-7) Abnormal (8-28)       Assessment & Plan:     Annual Wellness Visit  Reviewed patient's Family Medical History Reviewed and updated list of patient's medical providers Assessment of cognitive impairment was done Assessed patient's functional ability Established a written schedule for health screening [place]  Completed and Reviewed  Exercise Activities and Dietary recommendations Goals    None      Immunization History  Administered Date(s) Administered  . Influenza, High Dose Seasonal PF 01/17/2015, 01/10/2016  . Pneumococcal Conjugate-13 02/08/2014  . Pneumococcal Polysaccharide-23 01/30/2002, 01/21/2012  . Td 10/17/2009  . Tdap 01/21/2012  . Zoster 02/21/2013    Health Maintenance  Topic Date Due  . FOOT EXAM  08/09/1950  . OPHTHALMOLOGY EXAM  08/09/1950  . HEMOGLOBIN A1C  10/01/2016  . URINE MICROALBUMIN  11/11/2016  . TETANUS/TDAP  01/20/2022  . COLONOSCOPY  03/04/2023  . INFLUENZA VACCINE  Completed  . DEXA SCAN  Completed  . PNA vac Low Risk Adult  Completed     Discussed health benefits of physical activity, and encouraged her to engage in regular exercise appropriate for her age and condition.  Left rotator cuff syndrome Type 2 diabetes Diabetic neuropathy Increase gabapentin to 300 mg 3 times a day Hypertension Hyperlipidemia Atrial fibrillation Chronic anxiety     ------------------------------------------------------------------------------------------------------------  Patient seen and examined by Miguel Aschoff, MD, and note scribed by Renaldo Fiddler, CMA. I have done the exam and reviewed the above chart and it is accurate to the best of my knowledge. Development worker, community has been used in this note in any air is in the dictation or transcription are unintentional.  Wilhemena Durie, MD  [facility]

## 2016-05-19 DIAGNOSIS — I10 Essential (primary) hypertension: Secondary | ICD-10-CM | POA: Diagnosis not present

## 2016-05-19 DIAGNOSIS — G4733 Obstructive sleep apnea (adult) (pediatric): Secondary | ICD-10-CM | POA: Diagnosis not present

## 2016-05-19 DIAGNOSIS — I480 Paroxysmal atrial fibrillation: Secondary | ICD-10-CM | POA: Diagnosis not present

## 2016-05-19 DIAGNOSIS — Z9889 Other specified postprocedural states: Secondary | ICD-10-CM | POA: Diagnosis not present

## 2016-05-27 DIAGNOSIS — I480 Paroxysmal atrial fibrillation: Secondary | ICD-10-CM | POA: Diagnosis not present

## 2016-05-27 DIAGNOSIS — Z9889 Other specified postprocedural states: Secondary | ICD-10-CM | POA: Diagnosis not present

## 2016-05-27 DIAGNOSIS — I10 Essential (primary) hypertension: Secondary | ICD-10-CM | POA: Diagnosis not present

## 2016-05-27 DIAGNOSIS — I481 Persistent atrial fibrillation: Secondary | ICD-10-CM | POA: Diagnosis not present

## 2016-05-27 DIAGNOSIS — E7800 Pure hypercholesterolemia, unspecified: Secondary | ICD-10-CM | POA: Diagnosis not present

## 2016-05-28 ENCOUNTER — Ambulatory Visit
Admission: RE | Admit: 2016-05-28 | Discharge: 2016-05-28 | Disposition: A | Discharge status: Home / Self Care | Payer: Medicare Other | Source: Ambulatory Visit | Attending: Family Medicine | Admitting: Family Medicine

## 2016-05-28 ENCOUNTER — Ambulatory Visit (INDEPENDENT_AMBULATORY_CARE_PROVIDER_SITE_OTHER): Payer: Medicare Other | Admitting: Family Medicine

## 2016-05-28 ENCOUNTER — Telehealth: Payer: Self-pay

## 2016-05-28 VITALS — BP 158/80 | HR 74 | Temp 98.2°F | Resp 20 | Wt 195.0 lb

## 2016-05-28 DIAGNOSIS — J40 Bronchitis, not specified as acute or chronic: Secondary | ICD-10-CM

## 2016-05-28 DIAGNOSIS — R05 Cough: Secondary | ICD-10-CM

## 2016-05-28 DIAGNOSIS — R059 Cough, unspecified: Secondary | ICD-10-CM

## 2016-05-28 DIAGNOSIS — R918 Other nonspecific abnormal finding of lung field: Secondary | ICD-10-CM | POA: Diagnosis not present

## 2016-05-28 MED ORDER — DOXYCYCLINE HYCLATE 100 MG PO TABS: 100.0000 mg | ORAL_TABLET | Freq: Two times a day (BID) | ORAL | 0 refills | Status: DC

## 2016-05-28 NOTE — Patient Instructions (Signed)
Patient is to contact her cardiologist for PT/INR next week

## 2016-05-28 NOTE — Telephone Encounter (Signed)
Patient called office requesting to speak to Hannah Faulkner. Patient states she wanted her to know that cardiologist changed dosage of Diltiazem from 180mg  to 120mg . Patient states that you can give her a call back on her home phone if you have any further questions. KW

## 2016-05-28 NOTE — Progress Notes (Signed)
Hannah Faulkner  MRN: [id number] DOB: 21-Mar-1941  Subjective:  HPI   The patient is a 76 year old female who presents for evaluation of cough and congestion.  She states her symptoms began 5 days ago.  Her cough is minimally productive of milky phlegm.  She is concerned that she may have pneumonia.   The patient states she was hospitalized in December for double pneumonia.  Since that time she has had 2 episodes of atrial fibrillation.  Last week she was seen by her cardiologist on Monday 05/19/16 and was in atrial fib.  He started her on Diltiazem 180 mg and did a holter monitor.  He saw her back yesterday and she was told that she was no longer in atrial fib.  She discussed her current symptoms with him to see if it was from the new medicine and she was told he did not think it was coming from the medicine and that he could decrease it to 120 mg.  The patient however is uncertan if he sent in a new prescription and is going to check with the pharmacy. It is also of note that the patient had a 6 pound weight gain on her cardiologist's scale in 8 days.  Per the patient she said that her cardiologist did not think her symptoms were coming from a fib.  Patient Active Problem List   Diagnosis Date Noted  . Tricuspid regurgitation 05/15/2015  . Mitral valve regurgitation 05/15/2015  . Anxiety 08/25/2014  . A-fib ([place]) 08/25/2014  . Benign neoplasm of colon 08/25/2014  . 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 Diabetic neuropathy ([place]) 08/25/2014  . Essential (primary) hypertension 08/25/2014  . 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 Adiposity 08/25/2014  . Obstructive apnea 08/25/2014  . Hypercholesterolemia without hypertriglyceridemia 08/25/2014  . B12 deficiency 08/25/2014  . Type 2 diabetes mellitus ([place]) 08/25/2014  . H/O cardiac catheterization 07/15/2013  . BP (high blood pressure) 07/15/2013  . Apnea, sleep 07/15/2013    . AF (paroxysmal atrial fibrillation) ([place]) 07/15/2013    Past Medical History:  Diagnosis Date  . A-fib ([place])   . Hypertension     Social History   Social History  . Marital status: Widowed    Spouse name: N/A  . Number of children: 3  . Years of education: HS   Occupational History  .  Retired   Social History Main Topics  . Smoking status: Never Smoker  . Smokeless tobacco: Never Used  . Alcohol use No  . Drug use: No  . Sexual activity: Not on file   Other Topics Concern  . Not on file   Social History Narrative  . No narrative on file    Outpatient Encounter Prescriptions as of 05/28/2016  Medication Sig Note  . ALPRAZolam (XANAX) 0.5 MG tablet TAKE 1 TABLET BY MOUTH AT BEDTIME AS NEEDED FOR ANXIETY (Patient taking differently: TAKE 1/2-1 TABLET BY MOUTH AT BEDTIME AS NEEDED FOR ANXIETY)   . cyanocobalamin 1000 MCG tablet Take 1,000 mcg by mouth daily.   Marland Kitchen diltiazem (CARDIZEM) 120 MG tablet Take 120 mg by mouth 4 (four) times daily.   . flecainide (TAMBOCOR) 50 MG tablet Take 50 mg by mouth daily.    Marland Kitchen gabapentin (NEURONTIN) 100 MG capsule Take 2 capsules (200 mg total) by mouth 3 (three) times daily.   . hydrALAZINE (APRESOLINE) 25 MG tablet TAKE 1 TABLET BY MOUTH 3 TIMES A DAY   .  metoprolol (LOPRESSOR) 50 MG tablet Take 50 mg by mouth.    . potassium chloride (K-DUR) 10 MEQ tablet Take 10 mEq by mouth daily.    Marland Kitchen PROAIR HFA 108 (90 Base) MCG/ACT inhaler INHALE 2 INHALATIONS INTO THE LUNGS EVERY 6 (SIX) HOURS AS NEEDED FOR WHEEZING.   Marland Kitchen Pyridoxine HCl (VITAMIN B6) 200 MG TABS Take by mouth.   . triamterene-hydrochlorothiazide (MAXZIDE-25) 37.5-25 MG tablet TAKE 1 TABLET BY MOUTH EVERY DAY   . warfarin (COUMADIN) 2.5 MG tablet Take 2-2.5 mg by mouth every other day. Take 2mg  one day and take 2.5mg  next day, alternating. 02/29/2016: Took 2mg  last night.  . doxycycline (VIBRA-TABS) 100 MG tablet Take 1 tablet (100 mg total) by mouth 2 (two) times daily.    No  facility-administered encounter medications on file as of 05/28/2016.     Allergies  Allergen Reactions  . Ampicillin   . Penicillins   . Prinzide  [Lisinopril-Hydrochlorothiazide]     Review of Systems  Constitutional: Positive for chills and malaise/fatigue. Negative for fever.  HENT: Positive for congestion and ear pain (right). Negative for ear discharge, hearing loss, nosebleeds, sinus pain, sore throat and tinnitus.   Eyes: Positive for pain. Negative for blurred vision, double vision, photophobia, discharge and redness.  Respiratory: Positive for cough, sputum production (minimal), shortness of breath and wheezing. Negative for hemoptysis.   Cardiovascular: Positive for orthopnea. Negative for chest pain, palpitations (None since being out of a fib.), claudication and leg swelling (patient said no, but it looks as though she does have some swelling in her feet/ankles).  Neurological: Positive for weakness.    Objective:  BP (!) 158/80 (BP Location: Right Arm, Patient Position: Sitting, Cuff Size: Normal)   Pulse 74   Temp 98.2 F (36.8 C) (Oral)   Resp 20   Wt 195 lb (88.5 kg)   SpO2 94%   BMI 38.73 kg/m   Physical Exam  Constitutional: She is well-developed, well-nourished, and in no distress.  HENT:  Head: Normocephalic and atraumatic.  Right Ear: External ear normal.  Left Ear: External ear normal.  Nose: Nose normal.  Mouth/Throat: Oropharynx is clear and moist.  Eyes: Conjunctivae are normal. Pupils are equal, round, and reactive to light.  Neck: Normal range of motion. Neck supple.  Cardiovascular: Normal rate, regular rhythm and normal heart sounds.   Pulmonary/Chest: Effort normal and breath sounds normal.  Musculoskeletal: She exhibits edema.    Assessment and Plan :   1. Cough  - DG Chest 2 View; Future - doxycycline (VIBRA-TABS) 100 MG tablet; Take 1 tablet (100 mg total) by mouth 2 (two) times daily.  Dispense: 20 tablet; Refill: 0  2.  Bronchitis Pt worried about pneumonia. - DG Chest 2 View; Future - doxycycline (VIBRA-TABS) 100 MG tablet; Take 1 tablet (100 mg total) by mouth 2 (two) times daily.  Dispense: 20 tablet; Refill: 0  I have done the exam and reviewed the chart and it is accurate to the best of my knowledge. Development worker, community has been used and  any errors in dictation or transcription are unintentional. Miguel Aschoff M.D. [facility]

## 2016-05-29 ENCOUNTER — Telehealth: Payer: Self-pay

## 2016-05-29 MED ORDER — LEVOFLOXACIN 500 MG PO TABS: 500.0000 mg | ORAL_TABLET | Freq: Every day | ORAL | 0 refills | Status: DC

## 2016-05-29 NOTE — Telephone Encounter (Signed)
Spoke with patient's son. He would prefer to change abx to Levaquin instead of Doxy. Sent in medication into the pharmacy.

## 2016-05-29 NOTE — Telephone Encounter (Signed)
-----   Message from Jerrol Banana., MD sent at 05/28/2016  1:50 PM EST ----- Atelectasis likely. Take meds as directed.

## 2016-05-29 NOTE — Telephone Encounter (Signed)
Noted in chart  ED

## 2016-07-07 DIAGNOSIS — I480 Paroxysmal atrial fibrillation: Secondary | ICD-10-CM | POA: Diagnosis not present

## 2016-07-23 DIAGNOSIS — I10 Essential (primary) hypertension: Secondary | ICD-10-CM | POA: Diagnosis not present

## 2016-07-23 DIAGNOSIS — Z9889 Other specified postprocedural states: Secondary | ICD-10-CM | POA: Diagnosis not present

## 2016-07-23 DIAGNOSIS — G4730 Sleep apnea, unspecified: Secondary | ICD-10-CM | POA: Diagnosis not present

## 2016-07-23 DIAGNOSIS — I480 Paroxysmal atrial fibrillation: Secondary | ICD-10-CM | POA: Diagnosis not present

## 2016-07-23 DIAGNOSIS — E7800 Pure hypercholesterolemia, unspecified: Secondary | ICD-10-CM | POA: Diagnosis not present

## 2016-07-28 ENCOUNTER — Ambulatory Visit
Admission: RE | Admit: 2016-07-28 | Discharge: 2016-07-28 | Disposition: A | Discharge status: Home / Self Care | Payer: Medicare Other | Source: Ambulatory Visit | Attending: Family Medicine | Admitting: Family Medicine

## 2016-07-28 ENCOUNTER — Ambulatory Visit (INDEPENDENT_AMBULATORY_CARE_PROVIDER_SITE_OTHER): Payer: Medicare Other | Admitting: Family Medicine

## 2016-07-28 VITALS — BP 138/60 | HR 92 | Temp 98.3°F | Resp 16 | Wt 198.0 lb

## 2016-07-28 DIAGNOSIS — R05 Cough: Secondary | ICD-10-CM

## 2016-07-28 DIAGNOSIS — R059 Cough, unspecified: Secondary | ICD-10-CM

## 2016-07-28 DIAGNOSIS — I482 Chronic atrial fibrillation, unspecified: Secondary | ICD-10-CM

## 2016-07-28 DIAGNOSIS — I10 Essential (primary) hypertension: Secondary | ICD-10-CM

## 2016-07-28 DIAGNOSIS — R739 Hyperglycemia, unspecified: Secondary | ICD-10-CM

## 2016-07-28 DIAGNOSIS — F419 Anxiety disorder, unspecified: Secondary | ICD-10-CM | POA: Diagnosis not present

## 2016-07-28 DIAGNOSIS — E0842 Diabetes mellitus due to underlying condition with diabetic polyneuropathy: Secondary | ICD-10-CM | POA: Diagnosis not present

## 2016-07-28 DIAGNOSIS — E784 Other hyperlipidemia: Secondary | ICD-10-CM | POA: Diagnosis not present

## 2016-07-28 DIAGNOSIS — E7849 Other hyperlipidemia: Secondary | ICD-10-CM

## 2016-07-28 DIAGNOSIS — J189 Pneumonia, unspecified organism: Secondary | ICD-10-CM | POA: Diagnosis not present

## 2016-07-28 LAB — POCT GLYCOSYLATED HEMOGLOBIN (HGB A1C): HEMOGLOBIN A1C: 5.8

## 2016-07-28 NOTE — Progress Notes (Signed)
Hannah Faulkner  MRN: [id number] DOB: Nov 03, 1940  Subjective:  HPI  Patient is here for follow up. Last office visit was on 05/28/16 Bronchitis-treated with Doxy, after Chest Xray report changed Doxy to Levaquin per patient's son request. Chest xray result was : IMPRESSION: 1. New areas of lung opacity most evident in the lung bases. This may all reflect atelectasis. Pneumonia is possible. 2. No evidence of pulmonary edema.  Patient saw Dr Lorinda Creed PA on 07/23/16 and found she was in atrial fib. She was put on holter monitor 48 hours and turned it in on Friday 07/25/16, she was also advised to take Diltiazem 120 mg BID for now. Symptoms present right now fatigue, SOB, cough, leg swelling and abdominal bloated. B/P: patient has been checking her b/p, this morning it was  120/85 and sometimes lower and sometimes higher, it has been fluctuating. BP Readings from Last 3 Encounters:  07/28/16 138/60  05/28/16 (!) 158/80  05/14/16 118/60   Patient is not checking her sugar at home. Lab Results  Component Value Date   HGBA1C 6.0 04/03/2016    Patient Active Problem List   Diagnosis Date Noted  . Tricuspid regurgitation 05/15/2015  . Mitral valve regurgitation 05/15/2015  . Anxiety 08/25/2014  . A-fib ([place]) 08/25/2014  . Benign neoplasm of colon 08/25/2014  . 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 Diabetic neuropathy ([place]) 08/25/2014  . Essential (primary) hypertension 08/25/2014  . 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 Adiposity 08/25/2014  . Obstructive apnea 08/25/2014  . Hypercholesterolemia without hypertriglyceridemia 08/25/2014  . B12 deficiency 08/25/2014  . Type 2 diabetes mellitus ([place]) 08/25/2014  . H/O cardiac catheterization 07/15/2013  . BP (high blood pressure) 07/15/2013  . Apnea, sleep 07/15/2013  . AF (paroxysmal atrial fibrillation) ([place]) 07/15/2013    Past Medical History:  Diagnosis Date  .  A-fib ([place])   . Hypertension     Social History   Social History  . Marital status: Widowed    Spouse name: N/A  . Number of children: 3  . Years of education: HS   Occupational History  .  Retired   Social History Main Topics  . Smoking status: Never Smoker  . Smokeless tobacco: Never Used  . Alcohol use No  . Drug use: No  . Sexual activity: Not on file   Other Topics Concern  . Not on file   Social History Narrative  . No narrative on file    Outpatient Encounter Prescriptions as of 07/28/2016  Medication Sig Note  . ALPRAZolam (XANAX) 0.5 MG tablet TAKE 1 TABLET BY MOUTH AT BEDTIME AS NEEDED FOR ANXIETY (Patient taking differently: TAKE 1/2-1 TABLET BY MOUTH AT BEDTIME AS NEEDED FOR ANXIETY)   . cyanocobalamin 1000 MCG tablet Take 1,000 mcg by mouth daily.   Marland Kitchen diltiazem (CARDIZEM) 120 MG tablet Take 120 mg by mouth 2 times daily at 12 noon and 4 pm.    . flecainide (TAMBOCOR) 50 MG tablet Take 50 mg by mouth daily.    Marland Kitchen gabapentin (NEURONTIN) 100 MG capsule Take 2 capsules (200 mg total) by mouth 3 (three) times daily.   . hydrALAZINE (APRESOLINE) 25 MG tablet TAKE 1 TABLET BY MOUTH 3 TIMES A DAY   . metoprolol (LOPRESSOR) 50 MG tablet Take 50 mg by mouth.    . potassium chloride (K-DUR) 10 MEQ tablet Take 10 mEq by mouth daily.    Marland Kitchen PROAIR HFA 108 (90 Base)  MCG/ACT inhaler INHALE 2 INHALATIONS INTO THE LUNGS EVERY 6 (SIX) HOURS AS NEEDED FOR WHEEZING.   Marland Kitchen Pyridoxine HCl (VITAMIN B6) 200 MG TABS Take by mouth.   . triamterene-hydrochlorothiazide (MAXZIDE-25) 37.5-25 MG tablet TAKE 1 TABLET BY MOUTH EVERY DAY   . warfarin (COUMADIN) 2.5 MG tablet Take 2-2.5 mg by mouth every other day. Take 2mg  one day and take 2.5mg  next day, alternating. 02/29/2016: Took 2mg  last night.  . [DISCONTINUED] doxycycline (VIBRA-TABS) 100 MG tablet Take 1 tablet (100 mg total) by mouth 2 (two) times daily.   . [DISCONTINUED] levofloxacin (LEVAQUIN) 500 MG tablet Take 1 tablet (500 mg total) by  mouth daily.    No facility-administered encounter medications on file as of 07/28/2016.     Allergies  Allergen Reactions  . Ampicillin   . Penicillins   . Prinzide  [Lisinopril-Hydrochlorothiazide]     Review of Systems  Constitutional: Positive for malaise/fatigue.  HENT:       Post nasal drainage  Respiratory: Positive for cough and wheezing.   Cardiovascular: Positive for leg swelling.  Gastrointestinal:       Bloated  Musculoskeletal: Positive for back pain, joint pain, myalgias and neck pain.  Neurological: Positive for dizziness (better) and weakness.    Objective:  BP 138/60   Pulse 92 Comment: irregular  Temp 98.3 F (36.8 C)   Resp 16   Wt 198 lb (89.8 kg)   SpO2 96%   BMI 39.32 kg/m   Physical Exam  Constitutional: She is oriented to person, place, and time and well-developed, well-nourished, and in no distress.  HENT:  Head: Normocephalic and atraumatic.  Eyes: Conjunctivae are normal. Pupils are equal, round, and reactive to light.  Neck: Normal range of motion. Neck supple.  Cardiovascular: Normal rate, normal heart sounds and intact distal pulses.   No murmur heard. Pulmonary/Chest: Effort normal and breath sounds normal. No respiratory distress. She has no wheezes.  Musculoskeletal: She exhibits edema (trace).  Neurological: She is alert and oriented to person, place, and time.  Psychiatric: Mood, memory, affect and judgment normal.   Assessment and Plan :  1. Hyperglycemia A1C 5.8. Better. Follow. Work on habits. - POCT HgB A1C  2. Chronic anxiety Stable.  3. Chronic atrial fibrillation (HCC) In afib today. Patient saw Dr Lorinda Creed on 07/23/16 and has follow up on 08/13/16. Discussed with patient increasing Diltiazem to TID but patient does not want to do this at this time. Patient advised to keep follow up appointment with cardiologist on 08/13/16.  4. Essential (primary) hypertension Stable.  5. Other hyperlipidemia 6. Cough Repeat chest  xray today. Patient using inhalers to help with dyspnea. Discussed options for this issue with patient. Handicap form filled out today but temporarily one, advised patient to try and stay as active as she can. Exam is normal related to her lungs today. May need spirometry on the next visit, possible pulmonology referral. - DG Chest 2 View; Future  7. Diabetic polyneuropathy associated with diabetes mellitus due to underlying condition ([place])  HPI, Exam and A&P transcribed by Theressa Millard, RMA under direction and in the presence of Miguel Aschoff, MD. I have done the exam and reviewed the chart and it is accurate to the best of my knowledge. Development worker, community has been used and  any errors in dictation or transcription are unintentional. Miguel Aschoff M.D. [facility]

## 2016-07-30 ENCOUNTER — Telehealth: Payer: Self-pay

## 2016-07-30 NOTE — Telephone Encounter (Signed)
[id number] 

## 2016-07-30 NOTE — Telephone Encounter (Signed)
Patient advised.KW 

## 2016-07-30 NOTE — Telephone Encounter (Signed)
-----   Message from Jerrol Banana., MD sent at 07/30/2016 11:28 AM EDT ----- Improving CXR.

## 2016-07-31 ENCOUNTER — Ambulatory Visit: Payer: Self-pay | Admitting: Family Medicine

## 2016-07-31 ENCOUNTER — Ambulatory Visit (INDEPENDENT_AMBULATORY_CARE_PROVIDER_SITE_OTHER): Payer: Medicare Other | Admitting: Family Medicine

## 2016-07-31 VITALS — BP 124/62 | HR 64 | Temp 98.6°F | Resp 20 | Wt 200.0 lb

## 2016-07-31 DIAGNOSIS — I482 Chronic atrial fibrillation, unspecified: Secondary | ICD-10-CM

## 2016-07-31 DIAGNOSIS — E0842 Diabetes mellitus due to underlying condition with diabetic polyneuropathy: Secondary | ICD-10-CM

## 2016-07-31 DIAGNOSIS — I10 Essential (primary) hypertension: Secondary | ICD-10-CM

## 2016-07-31 DIAGNOSIS — R05 Cough: Secondary | ICD-10-CM

## 2016-07-31 DIAGNOSIS — R059 Cough, unspecified: Secondary | ICD-10-CM

## 2016-07-31 MED ORDER — LEVOFLOXACIN 500 MG PO TABS: 500.0000 mg | ORAL_TABLET | Freq: Every day | ORAL | 0 refills | Status: DC

## 2016-07-31 NOTE — Patient Instructions (Addendum)
Stop Flecainide for 5 days while taking Levaquin antibiotic. Make sure to check coumadin level on 08/04/16 as scheduled since you are been put on antibiotic.

## 2016-07-31 NOTE — Progress Notes (Signed)
Hannah Faulkner  MRN: [id number] DOB: 21-Oct-1940  Subjective:  HPI  Patient is here due to not feeling well. Her last office visit was on 07/28/16 and she feels worse overall. On her last visit she was having a cough and she was in afib for several days at that time. She has seen cardiologist prior to that visit on 07/23/16 and had EKG done and was placed on Holter monitor which she has not heard results of yet. We repeated chest xray at that time and it was noted that improving was seen on the xray.  Patient is fatigue, coughing, short of breath and the only pain she is having is sometimes on the sides of her neck and across shoulders, also still having leg swelling. She denies having pain anywhere else. NO dizziness. BP Readings from Last 3 Encounters:  07/31/16 124/62  07/28/16 138/60  05/28/16 (!) 158/80   Wt Readings from Last 3 Encounters:  07/31/16 200 lb (90.7 kg)  07/28/16 198 lb (89.8 kg)  05/28/16 195 lb (88.5 kg)   Patient Active Problem List   Diagnosis Date Noted  . Tricuspid regurgitation 05/15/2015  . Mitral valve regurgitation 05/15/2015  . Anxiety 08/25/2014  . A-fib ([place]) 08/25/2014  . Benign neoplasm of colon 08/25/2014  . 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 Diabetic neuropathy ([place]) 08/25/2014  . Essential (primary) hypertension 08/25/2014  . 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 Adiposity 08/25/2014  . Obstructive apnea 08/25/2014  . Hypercholesterolemia without hypertriglyceridemia 08/25/2014  . B12 deficiency 08/25/2014  . H/O cardiac catheterization 07/15/2013  . BP (high blood pressure) 07/15/2013  . Apnea, sleep 07/15/2013  . AF (paroxysmal atrial fibrillation) ([place]) 07/15/2013    Past Medical History:  Diagnosis Date  . A-fib ([place])   . Hypertension     Social History   Social History  . Marital status: Widowed    Spouse name: N/A  . Number of children: 3  . Years of  education: HS   Occupational History  .  Retired   Social History Main Topics  . Smoking status: Never Smoker  . Smokeless tobacco: Never Used  . Alcohol use No  . Drug use: No  . Sexual activity: Not on file   Other Topics Concern  . Not on file   Social History Narrative  . No narrative on file    Outpatient Encounter Prescriptions as of 07/31/2016  Medication Sig Note  . ALPRAZolam (XANAX) 0.5 MG tablet TAKE 1 TABLET BY MOUTH AT BEDTIME AS NEEDED FOR ANXIETY (Patient taking differently: TAKE 1/2-1 TABLET BY MOUTH AT BEDTIME AS NEEDED FOR ANXIETY)   . cyanocobalamin 1000 MCG tablet Take 1,000 mcg by mouth daily.   Marland Kitchen diltiazem (CARDIZEM) 120 MG tablet Take 120 mg by mouth 2 times daily at 12 noon and 4 pm.    . flecainide (TAMBOCOR) 50 MG tablet Take 50 mg by mouth daily.    Marland Kitchen gabapentin (NEURONTIN) 100 MG capsule Take 2 capsules (200 mg total) by mouth 3 (three) times daily.   . hydrALAZINE (APRESOLINE) 25 MG tablet TAKE 1 TABLET BY MOUTH 3 TIMES A DAY   . metoprolol (LOPRESSOR) 50 MG tablet Take 50 mg by mouth.    . potassium chloride (K-DUR) 10 MEQ tablet Take 10 mEq by mouth daily.    Marland Kitchen PROAIR HFA 108 (90 Base) MCG/ACT inhaler INHALE 2 INHALATIONS INTO THE LUNGS EVERY 6 (SIX) HOURS AS NEEDED FOR  WHEEZING.   Marland Kitchen Pyridoxine HCl (VITAMIN B6) 200 MG TABS Take by mouth.   . triamterene-hydrochlorothiazide (MAXZIDE-25) 37.5-25 MG tablet TAKE 1 TABLET BY MOUTH EVERY DAY   . warfarin (COUMADIN) 2.5 MG tablet Take 2-2.5 mg by mouth every other day. Take 2mg  one day and take 2.5mg  next day, alternating. 02/29/2016: Took 2mg  last night.   No facility-administered encounter medications on file as of 07/31/2016.     Allergies  Allergen Reactions  . Ampicillin   . Penicillins   . Prinzide  [Lisinopril-Hydrochlorothiazide]     Review of Systems  Constitutional: Positive for malaise/fatigue.  Eyes: Negative.   Respiratory: Positive for cough and shortness of breath.   Cardiovascular:  Positive for leg swelling.  Gastrointestinal: Negative.   Musculoskeletal: Positive for neck pain.  Skin: Negative.   Neurological: Negative.   Psychiatric/Behavioral: Negative.     Objective:  BP 124/62   Pulse 64   Temp 98.6 F (37 C)   Resp 20   Wt 200 lb (90.7 kg)   SpO2 95%   BMI 39.72 kg/m   Physical Exam  Constitutional: She is oriented to person, place, and time and well-developed, well-nourished, and in no distress.  HENT:  Head: Normocephalic and atraumatic.  Right Ear: External ear normal.  Left Ear: External ear normal.  Nose: Nose normal.  Eyes: Conjunctivae are normal. Pupils are equal, round, and reactive to light.  Neck: Normal range of motion. Neck supple.  No JVD.  Cardiovascular: Normal rate, regular rhythm, normal heart sounds and intact distal pulses.  Exam reveals no gallop.   No murmur heard. Pulmonary/Chest: Effort normal and breath sounds normal. No respiratory distress. She has no wheezes.  Musculoskeletal: She exhibits edema (1+).  Neurological: She is alert and oriented to person, place, and time.  Skin: Skin is warm and dry.  Face slightly flushed.   Assessment and Plan :  1. Cough Not better. Treat with Levaquin. Patient is not recovering as fast as should. Refer to pulmonologist for further work up.  - Ambulatory referral to Pulmonology  2. Chronic atrial fibrillation [facility]) Patient is following up with cardiologist.  3. Essential (primary) hypertension 4. Diabetic polyneuropathy associated with diabetes mellitus due to underlying condition ([place])  HPI, Exam and A&P transcribed by Theressa Millard, RMA under direction and in the presence of Miguel Aschoff, MD. I have done the exam and reviewed the chart and it is accurate to the best of my knowledge. Development worker, community has been used and  any errors in dictation or transcription are unintentional. Miguel Aschoff M.D. [facility]

## 2016-08-04 ENCOUNTER — Emergency Department: Payer: Medicare Other

## 2016-08-04 ENCOUNTER — Inpatient Hospital Stay
Admission: EM | Admit: 2016-08-04 | Discharge: 2016-08-14 | DRG: 291 | Disposition: A | Discharge status: Home Health | Payer: Medicare Other | Attending: Internal Medicine | Admitting: Internal Medicine

## 2016-08-04 ENCOUNTER — Encounter: Payer: Self-pay | Admitting: Emergency Medicine

## 2016-08-04 DIAGNOSIS — I959 Hypotension, unspecified: Secondary | ICD-10-CM | POA: Diagnosis not present

## 2016-08-04 DIAGNOSIS — F329 Major depressive disorder, single episode, unspecified: Secondary | ICD-10-CM | POA: Diagnosis present

## 2016-08-04 DIAGNOSIS — J90 Pleural effusion, not elsewhere classified: Secondary | ICD-10-CM | POA: Diagnosis not present

## 2016-08-04 DIAGNOSIS — J9811 Atelectasis: Secondary | ICD-10-CM | POA: Diagnosis not present

## 2016-08-04 DIAGNOSIS — I110 Hypertensive heart disease with heart failure: Principal | ICD-10-CM | POA: Diagnosis present

## 2016-08-04 DIAGNOSIS — E1142 Type 2 diabetes mellitus with diabetic polyneuropathy: Secondary | ICD-10-CM | POA: Diagnosis present

## 2016-08-04 DIAGNOSIS — J189 Pneumonia, unspecified organism: Secondary | ICD-10-CM | POA: Diagnosis present

## 2016-08-04 DIAGNOSIS — J9601 Acute respiratory failure with hypoxia: Secondary | ICD-10-CM | POA: Diagnosis present

## 2016-08-04 DIAGNOSIS — J810 Acute pulmonary edema: Secondary | ICD-10-CM | POA: Diagnosis not present

## 2016-08-04 DIAGNOSIS — Z833 Family history of diabetes mellitus: Secondary | ICD-10-CM

## 2016-08-04 DIAGNOSIS — Z7901 Long term (current) use of anticoagulants: Secondary | ICD-10-CM | POA: Diagnosis not present

## 2016-08-04 DIAGNOSIS — I509 Heart failure, unspecified: Secondary | ICD-10-CM | POA: Diagnosis not present

## 2016-08-04 DIAGNOSIS — T502X5A Adverse effect of carbonic-anhydrase inhibitors, benzothiadiazides and other diuretics, initial encounter: Secondary | ICD-10-CM | POA: Diagnosis not present

## 2016-08-04 DIAGNOSIS — I5033 Acute on chronic diastolic (congestive) heart failure: Secondary | ICD-10-CM | POA: Diagnosis not present

## 2016-08-04 DIAGNOSIS — I080 Rheumatic disorders of both mitral and aortic valves: Secondary | ICD-10-CM | POA: Diagnosis present

## 2016-08-04 DIAGNOSIS — F411 Generalized anxiety disorder: Secondary | ICD-10-CM | POA: Diagnosis present

## 2016-08-04 DIAGNOSIS — R197 Diarrhea, unspecified: Secondary | ICD-10-CM | POA: Diagnosis not present

## 2016-08-04 DIAGNOSIS — E782 Mixed hyperlipidemia: Secondary | ICD-10-CM | POA: Diagnosis present

## 2016-08-04 DIAGNOSIS — I272 Pulmonary hypertension, unspecified: Secondary | ICD-10-CM | POA: Diagnosis present

## 2016-08-04 DIAGNOSIS — E785 Hyperlipidemia, unspecified: Secondary | ICD-10-CM | POA: Diagnosis present

## 2016-08-04 DIAGNOSIS — I700 Atherosclerosis of aorta: Secondary | ICD-10-CM | POA: Diagnosis present

## 2016-08-04 DIAGNOSIS — I4891 Unspecified atrial fibrillation: Secondary | ICD-10-CM | POA: Diagnosis not present

## 2016-08-04 DIAGNOSIS — E876 Hypokalemia: Secondary | ICD-10-CM | POA: Diagnosis not present

## 2016-08-04 DIAGNOSIS — Z8249 Family history of ischemic heart disease and other diseases of the circulatory system: Secondary | ICD-10-CM | POA: Diagnosis not present

## 2016-08-04 DIAGNOSIS — Z79899 Other long term (current) drug therapy: Secondary | ICD-10-CM

## 2016-08-04 DIAGNOSIS — I2721 Secondary pulmonary arterial hypertension: Secondary | ICD-10-CM | POA: Diagnosis not present

## 2016-08-04 DIAGNOSIS — Z888 Allergy status to other drugs, medicaments and biological substances status: Secondary | ICD-10-CM

## 2016-08-04 DIAGNOSIS — I480 Paroxysmal atrial fibrillation: Secondary | ICD-10-CM | POA: Diagnosis not present

## 2016-08-04 DIAGNOSIS — J181 Lobar pneumonia, unspecified organism: Secondary | ICD-10-CM | POA: Diagnosis not present

## 2016-08-04 DIAGNOSIS — R0602 Shortness of breath: Secondary | ICD-10-CM | POA: Diagnosis not present

## 2016-08-04 DIAGNOSIS — G4733 Obstructive sleep apnea (adult) (pediatric): Secondary | ICD-10-CM | POA: Diagnosis not present

## 2016-08-04 DIAGNOSIS — I482 Chronic atrial fibrillation: Secondary | ICD-10-CM | POA: Diagnosis not present

## 2016-08-04 DIAGNOSIS — I5032 Chronic diastolic (congestive) heart failure: Secondary | ICD-10-CM | POA: Diagnosis not present

## 2016-08-04 DIAGNOSIS — E871 Hypo-osmolality and hyponatremia: Secondary | ICD-10-CM | POA: Diagnosis not present

## 2016-08-04 DIAGNOSIS — R6510 Systemic inflammatory response syndrome (SIRS) of non-infectious origin without acute organ dysfunction: Secondary | ICD-10-CM | POA: Diagnosis not present

## 2016-08-04 DIAGNOSIS — I10 Essential (primary) hypertension: Secondary | ICD-10-CM | POA: Diagnosis not present

## 2016-08-04 DIAGNOSIS — Z880 Allergy status to penicillin: Secondary | ICD-10-CM

## 2016-08-04 LAB — BASIC METABOLIC PANEL
Anion gap: 11 (ref 5–15)
BUN: 15 mg/dL (ref 6–20)
CALCIUM: 9 mg/dL (ref 8.9–10.3)
CO2: 28 mmol/L (ref 22–32)
CREATININE: 0.59 mg/dL (ref 0.44–1.00)
Chloride: 101 mmol/L (ref 101–111)
GFR calc Af Amer: >60 mL/min (ref >60)
GFR calc non Af Amer: >60 mL/min (ref >60)
Glucose, Bld: 142 mg/dL — ABNORMAL HIGH (ref 65–99)
Potassium: 3.7 mmol/L (ref 3.5–5.1)
SODIUM: 140 mmol/L (ref 135–145)

## 2016-08-04 LAB — CBC
HCT: 39.2 % (ref 35.0–47.0)
Hemoglobin: 13.2 g/dL (ref 12.0–16.0)
MCH: 28.5 pg (ref 26.0–34.0)
MCHC: 33.6 g/dL (ref 32.0–36.0)
MCV: 84.8 fL (ref 80.0–100.0)
PLATELETS: 273 10*3/uL (ref 150–440)
RBC: 4.62 MIL/uL (ref 3.80–5.20)
RDW: 15.5 % — AB (ref 11.5–14.5)
WBC: 9.5 10*3/uL (ref 3.6–11.0)

## 2016-08-04 LAB — PROTIME-INR
INR: 2.23
PROTHROMBIN TIME: 25.1 s — AB (ref 11.4–15.2)

## 2016-08-04 LAB — LACTIC ACID, PLASMA: LACTIC ACID, VENOUS: 1.3 mmol/L (ref 0.5–1.9)

## 2016-08-04 MED ORDER — VANCOMYCIN HCL IN DEXTROSE 750-5 MG/150ML-% IV SOLN
750.0000 mg | Freq: Two times a day (BID) | INTRAVENOUS | Status: DC
  Administered 2016-08-05 – 2016-08-06 (×3): 750 mg via INTRAVENOUS
  Filled 2016-08-04 (×5): qty 150

## 2016-08-04 MED ORDER — HYDRALAZINE HCL 25 MG PO TABS
25.0000 mg | ORAL_TABLET | Freq: Two times a day (BID) | ORAL | Status: DC
  Administered 2016-08-04 – 2016-08-05 (×2): 25 mg via ORAL
  Filled 2016-08-04 (×2): qty 1

## 2016-08-04 MED ORDER — DEXTROSE 5 % IV SOLN
2.0000 g | Freq: Three times a day (TID) | INTRAVENOUS | Status: DC
  Administered 2016-08-04 – 2016-08-06 (×6): 2 g via INTRAVENOUS
  Filled 2016-08-04 (×9): qty 2

## 2016-08-04 MED ORDER — ALPRAZOLAM 0.25 MG PO TABS
0.2500 mg | ORAL_TABLET | Freq: Every evening | ORAL | Status: DC | PRN
  Administered 2016-08-04 – 2016-08-13 (×8): 0.25 mg via ORAL
  Filled 2016-08-04 (×8): qty 1

## 2016-08-04 MED ORDER — METOPROLOL TARTRATE 50 MG PO TABS
50.0000 mg | ORAL_TABLET | Freq: Two times a day (BID) | ORAL | Status: DC
  Administered 2016-08-04 – 2016-08-06 (×4): 50 mg via ORAL
  Filled 2016-08-04 (×4): qty 1

## 2016-08-04 MED ORDER — DEXTROMETHORPHAN POLISTIREX ER 30 MG/5ML PO SUER
30.0000 mg | Freq: Two times a day (BID) | ORAL | Status: DC
  Administered 2016-08-04 – 2016-08-14 (×19): 30 mg via ORAL
  Filled 2016-08-04 (×25): qty 5

## 2016-08-04 MED ORDER — VITAMIN B-12 1000 MCG PO TABS
1000.0000 ug | ORAL_TABLET | Freq: Every day | ORAL | Status: DC
  Administered 2016-08-05 – 2016-08-14 (×9): 1000 ug via ORAL
  Filled 2016-08-04 (×11): qty 1

## 2016-08-04 MED ORDER — DILTIAZEM HCL 60 MG PO TABS
120.0000 mg | ORAL_TABLET | Freq: Two times a day (BID) | ORAL | Status: DC
  Administered 2016-08-05 (×2): 120 mg via ORAL
  Filled 2016-08-04 (×2): qty 2

## 2016-08-04 MED ORDER — IPRATROPIUM-ALBUTEROL 0.5-2.5 (3) MG/3ML IN SOLN: 3.0000 mL | Freq: Four times a day (QID) | RESPIRATORY_TRACT | Status: DC | PRN

## 2016-08-04 MED ORDER — POTASSIUM CHLORIDE CRYS ER 10 MEQ PO TBCR
10.0000 meq | EXTENDED_RELEASE_TABLET | Freq: Every day | ORAL | Status: DC
  Administered 2016-08-05: 10 meq via ORAL
  Filled 2016-08-04: qty 1

## 2016-08-04 MED ORDER — TRIAMTERENE-HCTZ 37.5-25 MG PO TABS
1.0000 | ORAL_TABLET | Freq: Every day | ORAL | Status: DC
  Administered 2016-08-05: 1 via ORAL
  Filled 2016-08-04: qty 1

## 2016-08-04 MED ORDER — WARFARIN SODIUM 1 MG PO TABS
2.0000 mg | ORAL_TABLET | ORAL | Status: DC
  Administered 2016-08-05: 2 mg via ORAL
  Filled 2016-08-04: qty 2

## 2016-08-04 MED ORDER — VANCOMYCIN HCL IN DEXTROSE 1-5 GM/200ML-% IV SOLN
1000.0000 mg | Freq: Once | INTRAVENOUS | Status: AC
  Administered 2016-08-04: 1000 mg via INTRAVENOUS
  Filled 2016-08-04: qty 200

## 2016-08-04 MED ORDER — AZTREONAM 2 G IJ SOLR
2.0000 g | Freq: Three times a day (TID) | INTRAMUSCULAR | Status: DC
  Filled 2016-08-04 (×3): qty 2

## 2016-08-04 MED ORDER — GUAIFENESIN ER 600 MG PO TB12
600.0000 mg | ORAL_TABLET | Freq: Two times a day (BID) | ORAL | Status: DC
  Administered 2016-08-04 – 2016-08-14 (×20): 600 mg via ORAL
  Filled 2016-08-04 (×20): qty 1

## 2016-08-04 MED ORDER — WARFARIN SODIUM 5 MG PO TABS
2.5000 mg | ORAL_TABLET | ORAL | Status: DC
  Administered 2016-08-05: 2.5 mg via ORAL
  Filled 2016-08-04: qty 2

## 2016-08-04 MED ORDER — DM-GUAIFENESIN ER 30-600 MG PO TB12
1.0000 | ORAL_TABLET | Freq: Two times a day (BID) | ORAL | Status: DC
Start: 2016-08-04 — End: 2016-08-04

## 2016-08-04 MED ORDER — GABAPENTIN 100 MG PO CAPS
100.0000 mg | ORAL_CAPSULE | Freq: Three times a day (TID) | ORAL | Status: DC
  Administered 2016-08-04 – 2016-08-14 (×29): 100 mg via ORAL
  Filled 2016-08-04 (×30): qty 1

## 2016-08-04 MED ORDER — SODIUM CHLORIDE 0.9 % IV SOLN
INTRAVENOUS | Status: DC
  Administered 2016-08-04: 23:00:00 via INTRAVENOUS

## 2016-08-04 NOTE — ED Provider Notes (Signed)
[facility] Emergency Department Provider Note  ____________________________________________   I have reviewed the triage vital signs and the nursing notes.   HISTORY  Chief Complaint Weakness and Shortness of Breath   History limited by: Not Limited   HPI Hannah Faulkner is a 76 y.o. female who presents to the emergency department today because of concerns for continued weakness and shortness of breath. Symptoms have been going on for at least the past week and a half. She has been having shortness of breath accompanied by nonproductive cough. Additionally she has had some chest discomfort. The patient has not had any measured fevers although she has had chills. She has been following up with her primary care physician who is diagnosed with pneumonia. The patient has tried both oral doxycycline and currently is on oral Levaquin. She is continued to get worse.     Past Medical History:  Diagnosis Date  . A-fib ([place])   . Hypertension     Patient Active Problem List   Diagnosis Date Noted  . Tricuspid regurgitation 05/15/2015  . Mitral valve regurgitation 05/15/2015  . Anxiety 08/25/2014  . A-fib ([place]) 08/25/2014  . Benign neoplasm of colon 08/25/2014  . 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 Diabetic neuropathy ([place]) 08/25/2014  . Essential (primary) hypertension 08/25/2014  . 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 Adiposity 08/25/2014  . Obstructive apnea 08/25/2014  . Hypercholesterolemia without hypertriglyceridemia 08/25/2014  . B12 deficiency 08/25/2014  . H/O cardiac catheterization 07/15/2013  . BP (high blood pressure) 07/15/2013  . Apnea, sleep 07/15/2013  . AF (paroxysmal atrial fibrillation) ([place]) 07/15/2013    Past Surgical History:  Procedure Laterality Date  . EYE SURGERY    . POLYPECTOMY     colon poly premoved    Prior to Admission medications   Medication Sig  Start Date End Date Taking? Authorizing Provider  ALPRAZolam (XANAX) 0.5 MG tablet TAKE 1 TABLET BY MOUTH AT BEDTIME AS NEEDED FOR ANXIETY Patient taking differently: TAKE 1/2 TABLET BY MOUTH AT BEDTIME AS NEEDED FOR ANXIETY 01/08/16  Yes Jerrol Banana., MD  cyanocobalamin 1000 MCG tablet Take 1,000 mcg by mouth daily.   Yes [provider]  diltiazem (CARDIZEM) 120 MG tablet Take 120 mg by mouth 2 times daily at 12 noon and 4 pm.    Yes [provider]  gabapentin (NEURONTIN) 100 MG capsule Take 2 capsules (200 mg total) by mouth 3 (three) times daily. Patient taking differently: Take 100 mg by mouth 3 (three) times daily.  05/14/16  Yes Jerrol Banana., MD  hydrALAZINE (APRESOLINE) 25 MG tablet TAKE 1 TABLET BY MOUTH 3 TIMES A DAY Patient taking differently: TAKE 1 TABLET BY MOUTH TWICE DAILY 04/08/16  Yes Jerrol Banana., MD  levofloxacin (LEVAQUIN) 500 MG tablet Take 1 tablet (500 mg total) by mouth daily. 07/31/16  Yes Jerrol Banana., MD  metoprolol (LOPRESSOR) 50 MG tablet Take 50 mg by mouth 2 (two) times daily.  11/16/13  Yes [provider]  potassium chloride (K-DUR) 10 MEQ tablet Take 10 mEq by mouth daily.  11/16/13  Yes [provider]  PROAIR HFA 108 (90 Base) MCG/ACT inhaler INHALE 2 INHALATIONS INTO THE LUNGS EVERY 6 (SIX) HOURS AS NEEDED FOR WHEEZING. 04/04/15  Yes [provider]  Pyridoxine HCl (VITAMIN B6) 200 MG TABS Take by mouth. 11/16/13  Yes [provider]  triamterene-hydrochlorothiazide (MAXZIDE-25) 37.5-25  MG tablet TAKE 1 TABLET BY MOUTH EVERY DAY 07/31/15  Yes Jerrol Banana., MD  warfarin (COUMADIN) 2 MG tablet Take 2 mg by mouth every other day. Take 2mg  one day and take 2.5mg  next day, alternating.   Yes [provider]  warfarin (COUMADIN) 2.5 MG tablet Take 2.5 mg by mouth every other day. Take 2mg  one day and take 2.5mg  next day, alternating. 11/16/13  Yes [provider]  flecainide (TAMBOCOR) 50 MG tablet Take 50 mg by mouth daily.  11/16/13   [provider]    Allergies Ampicillin; Penicillins; and Prinzide  [lisinopril-hydrochlorothiazide]  Family History  Problem Relation Age of Onset  . Hypertension Mother   . Heart disease Mother   . CVA Mother   . Heart attack Mother   . Lung cancer Father   . Heart attack Sister   . CVA Sister   . Thyroid disease Sister   . Liver cancer Brother   . Thyroid disease Sister   . Thyroid disease Sister   . Diabetes Sister   . Dementia Sister   . Atrial fibrillation Sister     Social History Social History  Substance Use Topics  . Smoking status: Never Smoker  . Smokeless tobacco: Never Used  . Alcohol use No    Review of Systems Constitutional: No fever/chills Eyes: No visual changes. ENT: No sore throat. Cardiovascular: Positive chest pain. Respiratory: Positive shortness of breath and cough.  Gastrointestinal: No abdominal pain.  No nausea, no vomiting.  No diarrhea.   Genitourinary: Negative for dysuria. Musculoskeletal: Negative for back pain. Skin: Negative for rash. Neurological: Negative for headaches, focal weakness or numbness.  ____________________________________________   PHYSICAL EXAM:  VITAL SIGNS: ED Triage Vitals  Enc Vitals Group     BP 08/04/16 1837 (!) 139/92     Pulse Rate 08/04/16 1554 (!) 109     Resp 08/04/16 1554 18     Temp 08/04/16 1554 98.6 F (37 C)     Temp Source 08/04/16 1554 Oral     SpO2 08/04/16 1554 92 %     Weight 08/04/16 1556 189 lb (85.7 kg)     Height 08/04/16 1556 5\' 1"  (1.549 m)     Head Circumference --      Peak Flow --      Pain Score 08/04/16 1554 6   Constitutional: Alert and oriented. Well appearing and in no distress. Eyes: Conjunctivae are normal. Normal extraocular movements. ENT   Head: Normocephalic and atraumatic.   Nose: No congestion/rhinnorhea.   Mouth/Throat: Mucous membranes are  moist.   Neck: No stridor. Hematological/Lymphatic/Immunilogical: No cervical lymphadenopathy. Cardiovascular: Normal rate, regular rhythm.  No murmurs, rubs, or gallops. Respiratory: Normal respiratory effort without tachypnea nor retractions. Breath sounds are clear and equal bilaterally. No wheezes/rales/rhonchi. Gastrointestinal: Soft and non tender. No rebound. No guarding.  Genitourinary: Deferred Musculoskeletal: Normal range of motion in all extremities. No lower extremity edema. Neurologic:  Normal speech and language. No gross focal neurologic deficits are appreciated.  Skin:  Skin is warm, dry and intact. No rash noted. Psychiatric: Mood and affect are normal. Speech and behavior are normal. Patient exhibits appropriate insight and judgment.  ____________________________________________    LABS (pertinent positives/negatives)  Labs Reviewed  BASIC METABOLIC PANEL - Abnormal; Notable for the following:       Result Value   Glucose, Bld 142 (*)    All other components within normal limits  CBC - Abnormal; Notable for the following:  RDW 15.5 (*)    All other components within normal limits  PROTIME-INR - Abnormal; Notable for the following:    Prothrombin Time 25.1 (*)    All other components within normal limits     ____________________________________________   EKG  I, Nance Pear, attending physician, personally viewed and interpreted this EKG  EKG Time: 1551 Rate: 141 Rhythm: atrial fibrillation with RVR Axis: left axis deviation Intervals: qtc 456 QRS: low voltage ST changes: no st elevation Impression: abnormal ekg   ____________________________________________    RADIOLOGY  CXR IMPRESSION: 1. Interval progression of Patchy and confluent bibasilar opacity. This is nonspecific but consider bilateral pneumonia or aspiration. 2. Stable cardiomegaly. No acute pulmonary edema, and no pleural effusion  identified.   ____________________________________________   PROCEDURES  Procedures  ____________________________________________   INITIAL IMPRESSION / ASSESSMENT AND PLAN / ED COURSE  Pertinent labs & imaging results that were available during my care of the patient were reviewed by me and considered in my medical decision making (see chart for details).  Patient presented to the emergency Department today with continued shortness breath and weakness per patient's currently on outpatient antibiotics for pneumonia. The patient's x-ray here is concerning for worsening pneumonia. At this point feel patient has failed outpatient antibiotics. We'll admit for IV antibiotics.  ____________________________________________   FINAL CLINICAL IMPRESSION(S) / ED DIAGNOSES  Final diagnoses:  Community acquired pneumonia, unspecified laterality     Note: This dictation was prepared with Sales executive. Any transcriptional errors that result from this process are unintentional     Nance Pear, MD 08/04/16 2110

## 2016-08-04 NOTE — Progress Notes (Signed)
Pharmacy Antibiotic Note  Hannah Faulkner is a 76 y.o. female admitted on 08/04/2016 with pneumonia.  Pharmacy has been consulted for vancomycin dosing.  Plan: DW 65 kg   Vd 46 L kei 0.057 hr-1    t1/2 12 hours Vancomycin 750 mg q 12 hours ordered with stacked dosing. Level before 5th dose. Goal trough 15-20.   Height: 5\' 1"  (154.9 cm) Weight: 201 lb 12.8 oz (91.5 kg) IBW/kg (Calculated) : 47.8  Temp (24hrs), Avg:98.3 F (36.8 C), Min:97.9 F (36.6 C), Max:98.6 F (37 C)   Recent Labs Lab 08/04/16 1602 08/04/16 2102  WBC 9.5  --   CREATININE 0.59  --   LATICACIDVEN  --  1.3    Estimated Creatinine Clearance: 62.6 mL/min (by C-G formula based on SCr of 0.59 mg/dL).    Allergies  Allergen Reactions  . Ampicillin   . Penicillins   . Prinzide  [Lisinopril-Hydrochlorothiazide]     Antimicrobials this admission: Vancomycin aztreonam 5/14 >>    >>   Dose adjustments this admission:   Microbiology results: 5/14 BCx: pending 5/14 Sputum: pending       5/14 CXR: bibasilar opacity Thank you for allowing pharmacy to be a part of this patient's care.  Tanara Turvey S 08/04/2016 10:51 PM

## 2016-08-04 NOTE — Progress Notes (Signed)
ANTICOAGULATION CONSULT NOTE - Initial Consult  Pharmacy Consult for warfarin dosing Indication: atrial fibrillation  Allergies  Allergen Reactions  . Ampicillin   . Penicillins   . Prinzide  [Lisinopril-Hydrochlorothiazide]     Patient Measurements: Height: 5\' 1"  (154.9 cm) Weight: 201 lb 12.8 oz (91.5 kg) IBW/kg (Calculated) : 47.8 Heparin Dosing Weight: n/a  Vital Signs: Temp: 97.9 F (36.6 C) (05/14 2228) Temp Source: Oral (05/14 2228) BP: 144/74 (05/14 2228) Pulse Rate: 92 (05/14 2228)  Labs:  Recent Labs  08/04/16 1602  HGB 13.2  HCT 39.2  PLT 273  LABPROT 25.1*  INR 2.23  CREATININE 0.59    Estimated Creatinine Clearance: 62.6 mL/min (by C-G formula based on SCr of 0.59 mg/dL).   Medical History: Past Medical History:  Diagnosis Date  . A-fib ([place])   . Hypertension     Medications:  Home dose is 2 mg and 2.5 mg on alternating days.  Assessment: INR therapeutic on admission.  Goal of Therapy:  INR 2-3    Plan:  Will restart home regimen. First dose in house 2.5 mg tonight as last documented dose was 2 mg on 5/13. Daily INR while on antibiotics.  Loris Winrow S 08/04/2016,11:07 PM

## 2016-08-04 NOTE — ED Triage Notes (Signed)
Patient presents to the ED with increased weakness and shortness of breath over the past two weeks as well as uncontrolled atrial fibrillation.  Patient appears tired at this time.  Patient was sent to the ED today by her cardiologist.  Patient has an appointment this week with a pulmonologist.  Patient's niece states patient was stating, "I don't want to die today, things aren't in order."  Patient has had several pre-syncopal/syncopal episodes over the past few days.  Family reports patient gets short of breath and exhausted walking very short distances.  Patient is complaining of right shoulder and right neck pain.

## 2016-08-04 NOTE — H&P (Signed)
History and Physical   [facility] - [place] @ [facility] Admission History and Physical [employer], D.O.    Patient Name: Hannah Faulkner MR#: [id number] Date of Birth: Sep 30, 1940 Date of Admission: 08/04/2016  Primary Care Physician: Jerrol Banana., MD Patient coming from: Home   Chief Complaint:  Chief Complaint  Patient presents with  . Weakness  . Shortness of Breath    HPI: Hannah Faulkner is a 76 y.o. female with a known history of atrial fibrillation, HTN, anxiety, depression, DM2, HLD, OSA, recently treated for CAP with Levaquin as outpatient presents to the emergency department for evaluation of SOB.  Patient was in a usual state of health until two weeks ago was referred to ED by her cardiologist.  She reports significantly decreased functional capacity in the past few days associated with chills, SOB, dyspnea on exertion and nonproductive cough.    He regular dose of flecainide has been on hold secondary to Levaquin. She has one sick contact in the past week.   Patient denies fevers, dizziness, chest pain, , N/V/C/D, abdominal pain, dysuria/frequency, changes in mental status.    Otherwise there has been no change in status. Patient has been taking medication as prescribed and there has been no recent change in medication or diet.  No recent antibiotics.  There has been no recent illness, hospitalizations, travel or sick contacts.    EMS/ED Course: Patient received Vanco, Azactam.  Review of Systems:  CONSTITUTIONAL: Positive chills, fatigue, weakness. Negative fever weight gain/loss, headache. EYES: No blurry or double vision. ENT: No tinnitus, postnasal drip, redness or soreness of the oropharynx. RESPIRATORY: Positive cough, dyspnea. No wheeze.  No hemoptysis.  CARDIOVASCULAR: No chest pain, palpitations, syncope, orthopnea. No lower extremity edema.  GASTROINTESTINAL: No nausea, vomiting, abdominal pain, diarrhea, constipation.  No hematemesis, melena or  hematochezia. GENITOURINARY: No dysuria, frequency, hematuria. ENDOCRINE: No polyuria or nocturia. No heat or cold intolerance. HEMATOLOGY: No anemia, bruising, bleeding. INTEGUMENTARY: No rashes, ulcers, lesions. MUSCULOSKELETAL: No arthritis, gout, dyspnea. NEUROLOGIC: No numbness, tingling, ataxia, seizure-type activity, weakness. PSYCHIATRIC: No anxiety, depression, insomnia.   Past Medical History:  Diagnosis Date  . A-fib ([place])   . 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 Unable to tolerate cpap. Moderately severe treated. Unable to tolerate cpap.   H/O cardiac catheterization 07/15/2013  Overview:   Overview:  Overview:  Abn stress in 2003 with cadiac cath following stress showing normal coronary arteries, normal function. Abn stress in 2003 with cadiac cath following stress showing normal coronary arteries, normal function.   HTN (hypertension) 07/15/2013  Essential hypertension      Past Surgical History:  Procedure Laterality Date  . EYE SURGERY    . POLYPECTOMY     colon poly premoved     reports that she has never smoked. She has never used smokeless tobacco. She reports that she  does not drink alcohol or use  drugs.  Allergies  Allergen Reactions  . Ampicillin   . Penicillins   . Prinzide  [Lisinopril-Hydrochlorothiazide]     Family History  Problem Relation Age of Onset  . Hypertension Mother   . Heart disease Mother   . CVA Mother   . Heart attack Mother   . Lung cancer Father   . Heart attack Sister   . CVA Sister   . Thyroid disease Sister   . Liver cancer Brother   . Thyroid disease Sister   . Thyroid disease Sister   . Diabetes Sister   . Dementia Sister   . Atrial fibrillation Sister     Prior to Admission medications   Medication Sig Start Date End Date Taking? Authorizing Provider  ALPRAZolam (XANAX) 0.5 MG tablet TAKE 1 TABLET BY MOUTH AT BEDTIME AS NEEDED FOR ANXIETY Patient taking differently: TAKE 1/2 TABLET BY MOUTH AT BEDTIME AS NEEDED FOR ANXIETY 01/08/16  Yes Jerrol Banana., MD  cyanocobalamin 1000 MCG tablet Take 1,000 mcg by mouth daily.   Yes [provider]  diltiazem (CARDIZEM) 120 MG tablet Take 120 mg by mouth 2 times daily at 12 noon and 4 pm.    Yes [provider]  gabapentin (NEURONTIN) 100 MG capsule Take 2 capsules (200 mg total) by mouth 3 (three) times daily. Patient taking differently: Take 100 mg by mouth 3 (three) times daily.  05/14/16  Yes Jerrol Banana., MD  hydrALAZINE (APRESOLINE) 25 MG tablet TAKE 1 TABLET BY MOUTH 3 TIMES A DAY Patient taking differently: TAKE 1 TABLET BY MOUTH TWICE DAILY 04/08/16  Yes Jerrol Banana., MD  levofloxacin (LEVAQUIN) 500 MG tablet Take 1 tablet (500 mg total) by mouth daily. 07/31/16  Yes Jerrol Banana., MD  metoprolol (LOPRESSOR) 50 MG tablet Take 50 mg by mouth 2 (two) times daily.  11/16/13  Yes [provider]  potassium chloride (K-DUR) 10 MEQ tablet Take 10 mEq by mouth daily.  11/16/13  Yes [provider]  PROAIR HFA 108 (90 Base) MCG/ACT inhaler INHALE 2 INHALATIONS INTO THE LUNGS EVERY 6 (SIX) HOURS AS NEEDED FOR WHEEZING. 04/04/15  Yes  [provider]  Pyridoxine HCl (VITAMIN B6) 200 MG TABS Take by mouth. 11/16/13  Yes [provider]  triamterene-hydrochlorothiazide (MAXZIDE-25) 37.5-25 MG tablet TAKE 1 TABLET BY MOUTH EVERY DAY 07/31/15  Yes Jerrol Banana., MD  warfarin (COUMADIN) 2 MG tablet Take 2 mg by mouth every other day. Take 2mg  one day and take 2.5mg  next day, alternating.   Yes [provider]  warfarin (COUMADIN) 2.5 MG tablet Take 2.5 mg by mouth every other day. Take 2mg  one day and take 2.5mg  next day, alternating. 11/16/13  Yes [provider]  flecainide (TAMBOCOR) 50 MG tablet Take 50 mg by mouth daily.  11/16/13   [provider]    Physical Exam: Vitals:   08/04/16 1556 08/04/16 1837 08/04/16 1900 08/04/16 2000  BP:  (!) 139/92 (!) 117/93 (!) 139/93  Pulse:  (!) 107    Resp:  (!) 23 (!) 24 (!) 23  Temp:      TempSrc:      SpO2:  94% 95% 95%  Weight: 85.7 kg (189 lb)     Height: 5\' 1"  (1.549 m)       GENERAL: 76 y.o.-year-old female patient, well-developed, well-nourished lying in the bed in no acute distress.  Pleasant and cooperative.  Appears weak, fatigued.     HEENT: Head atraumatic, normocephalic. Pupils equal, round, reactive to light and accommodation. No scleral icterus. Extraocular muscles intact. Nares are patent. Oropharynx is clear. Mucus membranes moist. NECK: Supple, full range of motion. No JVD, no bruit heard. No thyroid enlargement, no tenderness, no cervical lymphadenopathy. CHEST: Decreased air movement. Scant bibasilar rhonchi.  No use of accessory muscles of respiration.  No reproducible chest wall tenderness.  CARDIOVASCULAR: S1, S2 normal. No murmurs, rubs, or gallops. Cap refill <2 seconds. Pulses intact distally.  ABDOMEN: Soft, nondistended, nontender. No rebound, guarding, rigidity. Normoactive bowel sounds present in all four quadrants. No organomegaly or mass. EXTREMITIES: Mild bilateral pitting pedal edema. No calf  tenderness or Homan's sign.  NEUROLOGIC: The patient is alert and oriented x 3. Cranial nerves II through XII are grossly intact with no focal sensorimotor deficit. Muscle strength 5/5 in all extremities. Sensation intact. Gait not checked. PSYCHIATRIC:  Normal affect, mood, thought content. SKIN: Warm, dry, and intact without obvious rash, lesion, or ulcer.    Labs on Admission:  CBC:  Recent Labs Lab 08/04/16 1602  WBC 9.5  HGB 13.2  HCT 39.2  MCV 84.8  PLT 032   Basic Metabolic Panel:  Recent Labs Lab 08/04/16 1602  NA 140  K 3.7  CL 101  CO2 28  GLUCOSE 142*  BUN 15  CREATININE 0.59  CALCIUM 9.0   GFR: Estimated Creatinine Clearance: 60.4 mL/min (by C-G formula based on SCr of 0.59 mg/dL). Liver Function Tests: No results for input(s): AST, ALT, ALKPHOS, BILITOT, PROT, ALBUMIN in the last 168 hours. No results for input(s): LIPASE, AMYLASE in the last 168 hours. No results for input(s): AMMONIA in the last 168 hours. Coagulation Profile:  Recent Labs Lab 08/04/16 1602  INR 2.23   Cardiac Enzymes: No results for input(s): CKTOTAL, CKMB, CKMBINDEX, TROPONINI in the last 168 hours. BNP (last 3 results) No results for input(s): PROBNP in the last 8760 hours. HbA1C: No results for input(s): HGBA1C in the last 72 hours. CBG: No results for input(s): GLUCAP in the last 168 hours. Lipid Profile: No results for input(s): CHOL, HDL, LDLCALC, TRIG, CHOLHDL, LDLDIRECT in the last 72 hours. Thyroid Function Tests: No results for input(s): TSH, T4TOTAL, FREET4, T3FREE, THYROIDAB in the last 72 hours. Anemia Panel: No results for input(s): VITAMINB12, FOLATE, FERRITIN, TIBC, IRON, RETICCTPCT in the last 72 hours. Urine analysis: No results found for: COLORURINE, APPEARANCEUR, LABSPEC, PHURINE, GLUCOSEU, HGBUR, BILIRUBINUR, KETONESUR, PROTEINUR, UROBILINOGEN, NITRITE, LEUKOCYTESUR Sepsis Labs: @LABRCNTIP (procalcitonin:4,lacticidven:4) )No results found for this or  any previous visit (from the past 240 hour(s)).   Radiological Exams on Admission: Dg Chest 2 View  Result Date: 08/04/2016 CLINICAL DATA:  76 year old female with increased weakness and shortness of breath for 2 weeks. Uncontrolled atrial fibrillation. EXAM: CHEST  2 VIEW COMPARISON:  07/28/2016 and earlier. FINDINGS: Streaky and patchy bibasilar pulmonary opacity with mild progression since 07/28/2016. Stable cardiomegaly and mediastinal contours. No pneumothorax. Pulmonary vascularity appears stable. No definite pleural effusion. Negative visible bowel gas pattern. No acute osseous abnormality identified. Visualized tracheal air column is within normal limits. IMPRESSION: 1. Interval progression of Patchy and confluent bibasilar opacity. This is nonspecific but consider bilateral pneumonia or aspiration. 2. Stable cardiomegaly. No acute pulmonary edema, and no pleural effusion identified. Electronically Signed   By: Genevie Ann M.D.   On: 08/04/2016 17:18    EKG: Atrial fibrillation with RVR at 141 bpm with normal axis and nonspecific ST-T wave changes.  Assessment/Plan  This is a 76 y.o. female with a history of atrial fibrillation, HTN, anxiety, depression, DM2, HLD, OSA now being admitted with:  #. SIRS 2/2 bilateral Community Acquired Pneumonia, failed outpatient therapy. - Admit to inpatient - IV Azactam, Vanco per pharmacy - Check flu swab - IV fluid hydration - Duonebs, expectorants & O2 therapy as needed - Follow up blood & sputum cultures  #. Atrial fibrillation - Coumadin per pharmacy - Cardizem - Resume flecainide per pharmacy (had been on hold for Levaquin dosing).   #. HTN - Continue hydralazine, maxzide, Lopressor  Admission status: Inpatient IV Fluids: NS Diet/Nutrition: HH Consults called: None  DVT Px: Coumadin, SCDs and early ambulation. Code Status: Full Code  Disposition Plan: To home in 1-2 days  All the records are reviewed and case discussed with ED  provider. Management plans discussed with the patient and/or family who express understanding and agree with plan of care.  Jenessa Gillingham D.O. on 08/04/2016 at 8:29 PM Between 7am to 6pm - Pager - [phone] After 6pm go to [email or web address] - [employer] [facility] [place] [facility] Office [phone] CC: Primary care physician; Jerrol Banana., MD   08/04/2016, 8:29 PM

## 2016-08-04 NOTE — ED Notes (Signed)
Helped patient to the bathroom. Hooked patient back up to the monitor.

## 2016-08-05 LAB — GASTROINTESTINAL PANEL BY PCR, STOOL (REPLACES STOOL CULTURE)
ADENOVIRUS F40/41: NOT DETECTED
ASTROVIRUS: NOT DETECTED
Campylobacter species: NOT DETECTED
Cryptosporidium: NOT DETECTED
Cyclospora cayetanensis: NOT DETECTED
ENTEROPATHOGENIC E COLI (EPEC): NOT DETECTED
ENTEROTOXIGENIC E COLI (ETEC): NOT DETECTED
Entamoeba histolytica: NOT DETECTED
Enteroaggregative E coli (EAEC): NOT DETECTED
Giardia lamblia: NOT DETECTED
NOROVIRUS GI/GII: NOT DETECTED
Plesimonas shigelloides: NOT DETECTED
ROTAVIRUS A: NOT DETECTED
SHIGELLA/ENTEROINVASIVE E COLI (EIEC): NOT DETECTED
Salmonella species: NOT DETECTED
Sapovirus (I, II, IV, and V): NOT DETECTED
Shiga like toxin producing E coli (STEC): NOT DETECTED
Vibrio cholerae: NOT DETECTED
Vibrio species: NOT DETECTED
Yersinia enterocolitica: NOT DETECTED

## 2016-08-05 LAB — C DIFFICILE QUICK SCREEN W PCR REFLEX
C DIFFICILE (CDIFF) TOXIN: NEGATIVE
C Diff antigen: NEGATIVE
C Diff interpretation: NOT DETECTED

## 2016-08-05 LAB — MRSA PCR SCREENING: MRSA by PCR: NEGATIVE

## 2016-08-05 LAB — INFLUENZA PANEL BY PCR (TYPE A & B)
INFLBPCR: NEGATIVE
Influenza A By PCR: NEGATIVE

## 2016-08-05 LAB — PROTIME-INR
INR: 2.18
PROTHROMBIN TIME: 24.6 s — AB (ref 11.4–15.2)

## 2016-08-05 LAB — STREP PNEUMONIAE URINARY ANTIGEN: Strep Pneumo Urinary Antigen: NEGATIVE

## 2016-08-05 MED ORDER — FUROSEMIDE 10 MG/ML IJ SOLN
20.0000 mg | Freq: Every day | INTRAMUSCULAR | Status: DC
  Administered 2016-08-05 – 2016-08-06 (×2): 20 mg via INTRAVENOUS
  Filled 2016-08-05 (×2): qty 2

## 2016-08-05 MED ORDER — POTASSIUM CHLORIDE 20 MEQ PO PACK
40.0000 meq | PACK | Freq: Every day | ORAL | Status: DC
  Administered 2016-08-05 – 2016-08-08 (×4): 40 meq via ORAL
  Filled 2016-08-05 (×4): qty 2

## 2016-08-05 MED ORDER — ACETAMINOPHEN 325 MG PO TABS
650.0000 mg | ORAL_TABLET | Freq: Four times a day (QID) | ORAL | Status: DC | PRN
  Administered 2016-08-05 – 2016-08-11 (×8): 650 mg via ORAL
  Filled 2016-08-05 (×8): qty 2

## 2016-08-05 MED ORDER — METOPROLOL TARTRATE 5 MG/5ML IV SOLN
2.5000 mg | Freq: Four times a day (QID) | INTRAVENOUS | Status: DC | PRN
  Administered 2016-08-05: 2.5 mg via INTRAVENOUS
  Filled 2016-08-05 (×3): qty 5

## 2016-08-05 NOTE — Progress Notes (Signed)
[place] [facility] [place] NAME: Rocsi Hazelbaker    MR#:  [id number]  DATE OF BIRTH:  Apr 12, 1940  SUBJECTIVE:  CHIEF COMPLAINT:   Chief Complaint  Patient presents with  . Weakness  . Shortness of Breath  The patient is 76 year old female with past medical history significant for history of A. fib, hypertension, anxiety, depression, diabetes, hyperlipidemia, recently treated for community acquired pneumonia with levofloxacin, who presents to the hospital with shortness of breath, decreased functional capacity, chills, dyspnea, nonproductive cough. On arrival to the hospital, she was noted to have worsening pneumonia, she was admitted to the hospital for further evaluation, treatment, initiated on broad-spectrum antibiotic therapy. She feels somewhat comfortable today, admits of left shortness of breath than yesterday. She admits of some cough, dry, no significant sputum production. No chest pains. Admits of some loose stool.   Review of Systems  Constitutional: Positive for malaise/fatigue. Negative for chills, fever and weight loss.  HENT: Negative for congestion.   Eyes: Negative for blurred vision and double vision.  Respiratory: Positive for cough and shortness of breath. Negative for sputum production and wheezing.   Cardiovascular: Negative for chest pain, palpitations, orthopnea, leg swelling and PND.  Gastrointestinal: Positive for diarrhea. Negative for abdominal pain, blood in stool, constipation, nausea and vomiting.  Genitourinary: Negative for dysuria, frequency, hematuria and urgency.  Musculoskeletal: Negative for falls.  Neurological: Negative for dizziness, tremors, focal weakness and headaches.  Endo/Heme/Allergies: Does not bruise/bleed easily.  Psychiatric/Behavioral: Negative for depression. The patient does not have insomnia.     VITAL SIGNS: Blood pressure (!) 110/58, pulse 62, temperature 97.8 F (36.6 C), resp. rate  20, height 5\' 1"  (1.549 m), weight 91.5 kg (201 lb 12.8 oz), SpO2 94 %.  PHYSICAL EXAMINATION:   GENERAL:  76 y.o.-year-old patient lying in the bed with no acute distress.  EYES: Pupils equal, round, reactive to light and accommodation. No scleral icterus. Extraocular muscles intact.  HEENT: Head atraumatic, normocephalic. Oropharynx and nasopharynx clear.  NECK:  Supple, no jugular venous distention. No thyroid enlargement, no tenderness.  LUNGS: Normal breath sounds bilaterally, no wheezing, bilateral basilar rales,rhonchi and crepitations , more on the right than left. Intermittent dry cough , intermittent use of accessory muscles of respiration.  CARDIOVASCULAR: S1, S2 normal. No murmurs, rubs, or gallops.  ABDOMEN: Soft, nontender, nondistended. Bowel sounds present. No organomegaly or mass.  EXTREMITIES: No pedal edema, cyanosis, or clubbing.  NEUROLOGIC: Cranial nerves II through XII are intact. Muscle strength 5/5 in all extremities. Sensation intact. Gait not checked.  PSYCHIATRIC: The patient is alert and oriented x 3.  SKIN: No obvious rash, lesion, or ulcer.   ORDERS/RESULTS REVIEWED:   CBC  Recent Labs Lab 08/04/16 1602  WBC 9.5  HGB 13.2  HCT 39.2  PLT 273  MCV 84.8  MCH 28.5  MCHC 33.6  RDW 15.5*   ------------------------------------------------------------------------------------------------------------------  Chemistries   Recent Labs Lab 08/04/16 1602  NA 140  K 3.7  CL 101  CO2 28  GLUCOSE 142*  BUN 15  CREATININE 0.59  CALCIUM 9.0   ------------------------------------------------------------------------------------------------------------------ estimated creatinine clearance is 62.6 mL/min (by C-G formula based on SCr of 0.59 mg/dL). ------------------------------------------------------------------------------------------------------------------ No results for input(s): TSH, T4TOTAL, T3FREE, THYROIDAB in the last 72 hours.  Invalid  input(s): FREET3  Cardiac Enzymes No results for input(s): CKMB, TROPONINI, MYOGLOBIN in the last 168 hours.  Invalid input(s): CK ------------------------------------------------------------------------------------------------------------------ Invalid input(s): POCBNP ---------------------------------------------------------------------------------------------------------------  RADIOLOGY: Dg Chest 2 View  Result Date: 08/04/2016 CLINICAL DATA:  75 year old female with increased weakness and shortness of breath for 2 weeks. Uncontrolled atrial fibrillation. EXAM: CHEST  2 VIEW COMPARISON:  07/28/2016 and earlier. FINDINGS: Streaky and patchy bibasilar pulmonary opacity with mild progression since 07/28/2016. Stable cardiomegaly and mediastinal contours. No pneumothorax. Pulmonary vascularity appears stable. No definite pleural effusion. Negative visible bowel gas pattern. No acute osseous abnormality identified. Visualized tracheal air column is within normal limits. IMPRESSION: 1. Interval progression of Patchy and confluent bibasilar opacity. This is nonspecific but consider bilateral pneumonia or aspiration. 2. Stable cardiomegaly. No acute pulmonary edema, and no pleural effusion identified. Electronically Signed   By: Genevie Ann M.D.   On: 08/04/2016 17:18    EKG:  Orders placed or performed during the hospital encounter of 08/04/16  . EKG 12-Lead  . EKG 12-Lead  . ED EKG within 10 minutes  . ED EKG within 10 minutes    ASSESSMENT AND PLAN:  Active Problems:   Community acquired pneumonia  #1. Pneumonia of unclear etiology at this time,Blood cultures negative so far, MRSA PCR is negative, continue Zosyn for now, get speech therapist evaluation and recommendations for possible aspiration pneumonitis, although patient denies any aspiration issues . Streptococcus pneumonia urine antigen was negative   #2. Acute respiratory failure with hypoxia, patient was not on oxygen therapy at home,  continue oxygen, wean off as tolerated,  #3, A. fib, RVR, continue Cardizem, metoprolol, adding Cardizem pushes intravenously as needed.  #4. Essential hypertension, patient's blood pressure is relatively low, holding hydralazine, continue Cardizem and metoprolol, follow closely   Management plans discussed with the patient, family and they are in agreement.   DRUG ALLERGIES:  Allergies  Allergen Reactions  . Ampicillin   . Penicillins   . Prinzide  [Lisinopril-Hydrochlorothiazide]     CODE STATUS:     Code Status Orders        Start     Ordered   08/04/16 2222  Full code  Continuous     08/04/16 2221    Code Status History    Date Active Date Inactive Code Status Order ID Comments User Context   02/29/2016  8:53 PM 03/03/2016  2:03 PM Full Code [id number]  Henreitta Leber, MD Inpatient      TOTAL TIME TAKING CARE OF THIS PATIENT: 35 minutes.    Theodoro Grist M.D on 08/05/2016 at 3:55 PM  Between 7am to 6pm - Pager - [phone]  After 6pm go to [email or web address] - password [email or web address] [facility]  [place] [facility]  Office  [phone]  CC: Primary care physician; Jerrol Banana., MD

## 2016-08-06 ENCOUNTER — Inpatient Hospital Stay: Payer: Medicare Other

## 2016-08-06 DIAGNOSIS — J810 Acute pulmonary edema: Secondary | ICD-10-CM

## 2016-08-06 DIAGNOSIS — J189 Pneumonia, unspecified organism: Secondary | ICD-10-CM

## 2016-08-06 LAB — BASIC METABOLIC PANEL
Anion gap: 8 (ref 5–15)
BUN: 19 mg/dL (ref 6–20)
CALCIUM: 8.8 mg/dL — AB (ref 8.9–10.3)
CO2: 27 mmol/L (ref 22–32)
Chloride: 107 mmol/L (ref 101–111)
Creatinine, Ser: 0.71 mg/dL (ref 0.44–1.00)
GFR calc non Af Amer: >60 mL/min (ref >60)
GLUCOSE: 138 mg/dL — AB (ref 65–99)
Potassium: 3.6 mmol/L (ref 3.5–5.1)
Sodium: 142 mmol/L (ref 135–145)

## 2016-08-06 LAB — CBC WITH DIFFERENTIAL/PLATELET
BASOS PCT: 1 %
Basophils Absolute: 0.1 10*3/uL (ref 0–0.1)
Eosinophils Absolute: 0.1 10*3/uL (ref 0–0.7)
Eosinophils Relative: 1 %
HEMATOCRIT: 38.1 % (ref 35.0–47.0)
HEMOGLOBIN: 12.7 g/dL (ref 12.0–16.0)
LYMPHS ABS: 1.6 10*3/uL (ref 1.0–3.6)
LYMPHS PCT: 15 %
MCH: 28 pg (ref 26.0–34.0)
MCHC: 33.3 g/dL (ref 32.0–36.0)
MCV: 84.3 fL (ref 80.0–100.0)
MONOS PCT: 9 %
Monocytes Absolute: 0.9 10*3/uL (ref 0.2–0.9)
NEUTROS PCT: 74 %
Neutro Abs: 7.9 10*3/uL — ABNORMAL HIGH (ref 1.4–6.5)
Platelets: 222 10*3/uL (ref 150–440)
RBC: 4.53 MIL/uL (ref 3.80–5.20)
RDW: 15.6 % — ABNORMAL HIGH (ref 11.5–14.5)
WBC: 10.5 10*3/uL (ref 3.6–11.0)

## 2016-08-06 LAB — PROCALCITONIN: Procalcitonin: <0.1 ng/mL

## 2016-08-06 LAB — PROTIME-INR
INR: 3.53
Prothrombin Time: 36.2 seconds — ABNORMAL HIGH (ref 11.4–15.2)

## 2016-08-06 LAB — GLUCOSE, CAPILLARY
GLUCOSE-CAPILLARY: 97 mg/dL (ref 65–99)
Glucose-Capillary: 128 mg/dL — ABNORMAL HIGH (ref 65–99)
Glucose-Capillary: 116 mg/dL — ABNORMAL HIGH (ref 65–99)

## 2016-08-06 LAB — LEGIONELLA PNEUMOPHILA SEROGP 1 UR AG: L. pneumophila Serogp 1 Ur Ag: NEGATIVE

## 2016-08-06 LAB — TROPONIN I: Troponin I: <0.03 ng/mL (ref <0.03)

## 2016-08-06 LAB — HIV ANTIBODY (ROUTINE TESTING W REFLEX): HIV Screen 4th Generation wRfx: NONREACTIVE

## 2016-08-06 MED ORDER — DILTIAZEM HCL ER COATED BEADS 180 MG PO CP24
180.0000 mg | ORAL_CAPSULE | Freq: Two times a day (BID) | ORAL | Status: DC
  Administered 2016-08-06 – 2016-08-14 (×17): 180 mg via ORAL
  Filled 2016-08-06 (×17): qty 1

## 2016-08-06 MED ORDER — MEROPENEM 1 G IV SOLR
1.0000 g | Freq: Three times a day (TID) | INTRAVENOUS | Status: DC
  Administered 2016-08-06 – 2016-08-07 (×2): 1 g via INTRAVENOUS
  Filled 2016-08-06 (×5): qty 1

## 2016-08-06 MED ORDER — DILTIAZEM LOAD VIA INFUSION
15.0000 mg | Freq: Once | INTRAVENOUS | Status: AC
  Administered 2016-08-06: 15 mg via INTRAVENOUS
  Filled 2016-08-06: qty 15

## 2016-08-06 MED ORDER — INSULIN ASPART 100 UNIT/ML ~~LOC~~ SOLN
0.0000 [IU] | Freq: Three times a day (TID) | SUBCUTANEOUS | Status: DC
  Administered 2016-08-07: 2 [IU] via SUBCUTANEOUS
  Administered 2016-08-08: 3 [IU] via SUBCUTANEOUS
  Administered 2016-08-09 – 2016-08-12 (×2): 2 [IU] via SUBCUTANEOUS
  Administered 2016-08-12: 3 [IU] via SUBCUTANEOUS
  Administered 2016-08-13 (×3): 2 [IU] via SUBCUTANEOUS
  Filled 2016-08-06 (×2): qty 3
  Filled 2016-08-06 (×6): qty 2

## 2016-08-06 MED ORDER — FUROSEMIDE 10 MG/ML IJ SOLN
20.0000 mg | Freq: Two times a day (BID) | INTRAMUSCULAR | Status: DC
  Administered 2016-08-06 – 2016-08-11 (×9): 20 mg via INTRAVENOUS
  Filled 2016-08-06 (×9): qty 2

## 2016-08-06 MED ORDER — METOPROLOL TARTRATE 5 MG/5ML IV SOLN
5.0000 mg | Freq: Once | INTRAVENOUS | Status: AC
  Administered 2016-08-06: 5 mg via INTRAVENOUS

## 2016-08-06 MED ORDER — DILTIAZEM HCL 100 MG IV SOLR
5.0000 mg/h | INTRAVENOUS | Status: DC
  Administered 2016-08-06: 5 mg/h via INTRAVENOUS
  Filled 2016-08-06 (×2): qty 100

## 2016-08-06 MED ORDER — FLECAINIDE ACETATE 50 MG PO TABS
50.0000 mg | ORAL_TABLET | Freq: Every day | ORAL | Status: DC
  Filled 2016-08-06: qty 1

## 2016-08-06 MED ORDER — METOPROLOL TARTRATE 50 MG PO TABS
50.0000 mg | ORAL_TABLET | Freq: Two times a day (BID) | ORAL | Status: DC
  Administered 2016-08-06 – 2016-08-14 (×15): 50 mg via ORAL
  Filled 2016-08-06 (×16): qty 1

## 2016-08-06 MED ORDER — TRAMADOL HCL 50 MG PO TABS
50.0000 mg | ORAL_TABLET | Freq: Once | ORAL | Status: AC
  Administered 2016-08-06: 50 mg via ORAL
  Filled 2016-08-06: qty 1

## 2016-08-06 MED ORDER — INSULIN ASPART 100 UNIT/ML ~~LOC~~ SOLN: 0.0000 [IU] | Freq: Every day | SUBCUTANEOUS | Status: DC

## 2016-08-06 NOTE — Progress Notes (Signed)
[facility] NAME: Hannah Faulkner    MR#:  [id number]  DATE OF BIRTH:  03-09-1941  SUBJECTIVE:  CHIEF COMPLAINT:   Chief Complaint  Patient presents with  . Weakness  . Shortness of Breath  The patient is 76 year old female with past medical history significant for history of A. fib, hypertension, anxiety, depression, diabetes, hyperlipidemia, recently treated for community acquired pneumonia with levofloxacin, who presents to the hospital with shortness of breath, decreased functional capacity, chills, dyspnea, nonproductive cough. On arrival to the hospital, she was noted to have worsening pneumonia, she was admitted to the hospital for further evaluation, treatment, initiated on broad-spectrum antibiotic therapy. The patient developed A. fib, RVR with heart rate to 160. Today, developing significant shortness of breath/dyspnea, requiring Lasix administration. It did not respond to intravenous metoprolol, and patient was transferred to intensive care unit for intravenous Cardizem drip. Flecainide is restarted.  Review of Systems  Constitutional: Positive for malaise/fatigue. Negative for chills, fever and weight loss.  HENT: Negative for congestion.   Eyes: Negative for blurred vision and double vision.  Respiratory: Positive for cough and shortness of breath. Negative for sputum production and wheezing.   Cardiovascular: Negative for chest pain, palpitations, orthopnea, leg swelling and PND.  Gastrointestinal: Positive for diarrhea. Negative for abdominal pain, blood in stool, constipation, nausea and vomiting.  Genitourinary: Negative for dysuria, frequency, hematuria and urgency.  Musculoskeletal: Negative for falls.  Neurological: Negative for dizziness, tremors, focal weakness and headaches.  Endo/Heme/Allergies: Does not bruise/bleed easily.  Psychiatric/Behavioral: Negative for depression. The patient does not have  insomnia.     VITAL SIGNS: Blood pressure 126/72, pulse 87, temperature 98.7 F (37.1 C), temperature source Oral, resp. rate 17, height 5\' 1"  (1.549 m), weight 91.5 kg (201 lb 12.8 oz), SpO2 94 %.  PHYSICAL EXAMINATION:   GENERAL:  76 y.o.-year-old patient lying in the bed in moderate to severe respiratory distress, anxious. Affect.  EYES: Pupils equal, round, reactive to light and accommodation. No scleral icterus. Extraocular muscles intact.  HEENT: Head atraumatic, normocephalic. Oropharynx and nasopharynx clear.  NECK:  Supple, jugular venous distention. No thyroid enlargement, no tenderness.  LUNGS: Diminished breath sounds bilaterally, no wheezing, but bilateral basilar rales,rhonchi and crepitations ,  using accessory muscles of respiration.  CARDIOVASCULAR: S1, S2 irregularly irregular, tachycardic . No murmurs, rubs, or gallops.  ABDOMEN: Soft, nontender, nondistended. Bowel sounds present. No organomegaly or mass.  EXTREMITIES: Trace lower extremity and pedal edema, no cyanosis, or clubbing.  NEUROLOGIC: Cranial nerves II through XII are intact. Muscle strength 5/5 in all extremities. Sensation intact. Gait not checked.  PSYCHIATRIC: The patient is alert and oriented x 3. Anxious affect SKIN: No obvious rash, lesion, or ulcer.   ORDERS/RESULTS REVIEWED:   CBC  Recent Labs Lab 08/04/16 1602 08/06/16 1131  WBC 9.5 10.5  HGB 13.2 12.7  HCT 39.2 38.1  PLT 273 222  MCV 84.8 84.3  MCH 28.5 28.0  MCHC 33.6 33.3  RDW 15.5* 15.6*  LYMPHSABS  --  1.6  MONOABS  --  0.9  EOSABS  --  0.1  BASOSABS  --  0.1   ------------------------------------------------------------------------------------------------------------------  Chemistries   Recent Labs Lab 08/04/16 1602 08/06/16 1131  NA 140 142  K 3.7 3.6  CL 101 107  CO2 28 27  GLUCOSE 142* 138*  BUN 15 19  CREATININE 0.59 0.71  CALCIUM 9.0 8.8*    ------------------------------------------------------------------------------------------------------------------ estimated creatinine clearance is 62.6 mL/min (  by C-G formula based on SCr of 0.71 mg/dL). ------------------------------------------------------------------------------------------------------------------ No results for input(s): TSH, T4TOTAL, T3FREE, THYROIDAB in the last 72 hours.  Invalid input(s): FREET3  Cardiac Enzymes  Recent Labs Lab 08/06/16 0349  TROPONINI <0.03   ------------------------------------------------------------------------------------------------------------------ Invalid input(s): POCBNP ---------------------------------------------------------------------------------------------------------------  RADIOLOGY: Dg Chest 2 View  Result Date: 08/04/2016 CLINICAL DATA:  76 year old female with increased weakness and shortness of breath for 2 weeks. Uncontrolled atrial fibrillation. EXAM: CHEST  2 VIEW COMPARISON:  07/28/2016 and earlier. FINDINGS: Streaky and patchy bibasilar pulmonary opacity with mild progression since 07/28/2016. Stable cardiomegaly and mediastinal contours. No pneumothorax. Pulmonary vascularity appears stable. No definite pleural effusion. Negative visible bowel gas pattern. No acute osseous abnormality identified. Visualized tracheal air column is within normal limits. IMPRESSION: 1. Interval progression of Patchy and confluent bibasilar opacity. This is nonspecific but consider bilateral pneumonia or aspiration. 2. Stable cardiomegaly. No acute pulmonary edema, and no pleural effusion identified. Electronically Signed   By: Genevie Ann M.D.   On: 08/04/2016 17:18   Ct Chest Wo Contrast  Result Date: 08/06/2016 CLINICAL DATA:  Pneumonia.  Cough and shortness of breath. EXAM: CT CHEST WITHOUT CONTRAST TECHNIQUE: Multidetector CT imaging of the chest was performed following the standard protocol without IV contrast. COMPARISON:  Chest  radiographs 08/04/2016 FINDINGS: Cardiovascular: Normal caliber of the thoracic aorta with mild atherosclerotic calcification. Mild enlargement of the main pulmonary artery may reflect underlying pulmonary arterial hypertension. Mild coronary artery calcification. Mild cardiomegaly. No pericardial effusion. Mediastinum/Nodes: There is mild nodular enlargement of the left thyroid lobe and isthmus, with an approximately 1.6 cm nodule extending from the isthmus and with a left lower pole nodule difficult to discretely measure. There is an increased number of small lymph nodes throughout the mediastinum measuring up to 1.1 cm in short axis, likely reactive. No axillary or definite hilar lymphadenopathy is identified on this unenhanced study. Unremarkable esophagus. Lungs/Pleura: There are small pleural effusions, right larger than left. Dense consolidation is present in the right lower lobe with air bronchograms. There are patchy areas of consolidation in the left lower lobe, right middle lobe, and lingula. Minimal nodular opacity is present anteriorly in the right upper lobe. Mosaic attenuation of both upper lobes may reflect mosaic perfusion of vascular origin given evidence of pulmonary hypertension. Upper Abdomen: There is prominence of the lateral segment of the left hepatic lobe which demonstrates a mildly nodular contour. There may also be a recanalized paraumbilical vein. Musculoskeletal: Moderate thoracic disc degeneration. No suspicious osseous lesion. IMPRESSION: 1. Dense right lower lobe consolidation with patchy consolidation elsewhere in both lungs compatible with pneumonia. 2. Small pleural effusions, right larger than left. 3. Findings suggesting pulmonary arterial hypertension. 4. Morphologic liver changes which may reflect underlying cirrhosis. 5.  Aortic Atherosclerosis (ICD10-I70.0). Electronically Signed   By: Logan Bores M.D.   On: 08/06/2016 09:46    EKG:  Orders placed or performed during  the hospital encounter of 08/04/16  . EKG 12-Lead  . EKG 12-Lead  . ED EKG within 10 minutes  . ED EKG within 10 minutes    ASSESSMENT AND PLAN:  Active Problems:   Community acquired pneumonia  #1. Pneumonia of unclear etiology at this time, Blood cultures negative so far, MRSA PCR is negative, continue Zosyn. The patient was evaluated by speech therapist recommended regular diet and thin liquids. Streptococcus pneumonia urine antigen was negative . Pulmonology consultation is requested. Pro-calcitonin level was less than 0.1   #2. Acute respiratory failure with hypoxia, patient was  not on oxygen therapy at home, now on 3 L, continue oxygen, wean off as tolerated, patient was given Lasix 1 today  #3, A. fib, RVR, now on Cardizem IV drip, continue Cardizem, metoprolol, resume flecainide, getting cardiologist involved for further recommendations, following closely , troponin was negative. Coumadin dosing as per pharmacy #4. Essential hypertension, patient's blood pressure was relatively low, holding hydralazine, continue Cardizem and metoprolol, follow closely #5 diarrheal, negative for C. difficile, gastrointestinal panel is negative, diarrheal stool subsided  Management plans discussed with the patient, family and they are in agreement.   DRUG ALLERGIES:  Allergies  Allergen Reactions  . Ampicillin   . Penicillins   . Prinzide  [Lisinopril-Hydrochlorothiazide]     CODE STATUS:     Code Status Orders        Start     Ordered   08/04/16 2222  Full code  Continuous     08/04/16 2221    Code Status History    Date Active Date Inactive Code Status Order ID Comments User Context   02/29/2016  8:53 PM 03/03/2016  2:03 PM Full Code [id number]  Henreitta Leber, MD Inpatient      TOTAL TIME TAKING CARE OF THIS PATIENT: 35 minutes.    Theodoro Grist M.D on 08/06/2016 at 3:56 PM  Between 7am to 6pm - Pager - [phone]  After 6pm go to [email or web address] - password [email or web address]  [facility]  [facility]  Office  [phone]  CC: Primary care physician; Jerrol Banana., MD

## 2016-08-06 NOTE — Progress Notes (Signed)
ANTIBIOTIC CONSULT NOTE - INITIAL  Pharmacy Consult for Meropenem Indication: pneumonia  Allergies  Allergen Reactions  . Ampicillin   . Penicillins   . Prinzide  [Lisinopril-Hydrochlorothiazide]     Patient Measurements: Height: 5\' 1"  (154.9 cm) Weight: 201 lb 12.8 oz (91.5 kg) IBW/kg (Calculated) : 47.8  Vital Signs: Temp: 98.7 F (37.1 C) (05/16 1200) Temp Source: Oral (05/16 1200) BP: 126/72 (05/16 1400) Pulse Rate: 87 (05/16 1400) Intake/Output from previous day: 05/15 0701 - 05/16 0700 In: 0623 [P.O.:1240; I.V.:75; IV Piggyback:350] Out: -  Intake/Output from this shift: Total I/O In: 120 [P.O.:120] Out: 150 [Urine:150]  Labs:  Recent Labs  08/04/16 1602 08/06/16 1131  WBC 9.5 10.5  HGB 13.2 12.7  PLT 273 222  CREATININE 0.59 0.71   Estimated Creatinine Clearance: 62.6 mL/min (by C-G formula based on SCr of 0.71 mg/dL). No results for input(s): VANCOTROUGH, VANCOPEAK, VANCORANDOM, GENTTROUGH, GENTPEAK, GENTRANDOM, TOBRATROUGH, TOBRAPEAK, TOBRARND, AMIKACINPEAK, AMIKACINTROU, AMIKACIN in the last 72 hours.   Microbiology: Recent Results (from the past 720 hour(s))  Blood culture (routine x 2)     Status: None (Preliminary result)   Collection Time: 08/04/16  9:02 PM  Result Value Ref Range Status   Specimen Description BLOOD R AC  Final   Special Requests Blood Culture adequate volume  Final   Culture NO GROWTH 2 DAYS  Final   Report Status PENDING  Incomplete  Blood culture (routine x 2)     Status: None (Preliminary result)   Collection Time: 08/04/16  9:02 PM  Result Value Ref Range Status   Specimen Description BLOOD L AC  Final   Special Requests Blood Culture adequate volume  Final   Culture NO GROWTH 2 DAYS  Final   Report Status PENDING  Incomplete  MRSA PCR Screening     Status: None   Collection Time: 08/05/16 10:15 AM  Result Value Ref Range Status   MRSA by PCR NEGATIVE NEGATIVE Final    Comment:        The GeneXpert MRSA Assay  (FDA approved for NASAL specimens only), is one component of a comprehensive MRSA colonization surveillance program. It is not intended to diagnose MRSA infection nor to guide or monitor treatment for MRSA infections.   C difficile quick scan w PCR reflex     Status: None   Collection Time: 08/05/16  6:00 PM  Result Value Ref Range Status   C Diff antigen NEGATIVE NEGATIVE Final   C Diff toxin NEGATIVE NEGATIVE Final   C Diff interpretation No C. difficile detected.  Final  Gastrointestinal Panel by PCR , Stool     Status: None   Collection Time: 08/05/16  6:00 PM  Result Value Ref Range Status   Campylobacter species NOT DETECTED NOT DETECTED Final   Plesimonas shigelloides NOT DETECTED NOT DETECTED Final   Salmonella species NOT DETECTED NOT DETECTED Final   Yersinia enterocolitica NOT DETECTED NOT DETECTED Final   Vibrio species NOT DETECTED NOT DETECTED Final   Vibrio cholerae NOT DETECTED NOT DETECTED Final   Enteroaggregative E coli (EAEC) NOT DETECTED NOT DETECTED Final   Enteropathogenic E coli (EPEC) NOT DETECTED NOT DETECTED Final   Enterotoxigenic E coli (ETEC) NOT DETECTED NOT DETECTED Final   Shiga like toxin producing E coli (STEC) NOT DETECTED NOT DETECTED Final   Shigella/Enteroinvasive E coli (EIEC) NOT DETECTED NOT DETECTED Final   Cryptosporidium NOT DETECTED NOT DETECTED Final   Cyclospora cayetanensis NOT DETECTED NOT DETECTED Final   Entamoeba  histolytica NOT DETECTED NOT DETECTED Final   Giardia lamblia NOT DETECTED NOT DETECTED Final   Adenovirus F40/41 NOT DETECTED NOT DETECTED Final   Astrovirus NOT DETECTED NOT DETECTED Final   Norovirus GI/GII NOT DETECTED NOT DETECTED Final   Rotavirus A NOT DETECTED NOT DETECTED Final   Sapovirus (I, II, IV, and V) NOT DETECTED NOT DETECTED Final    Medical History: Past Medical History:  Diagnosis Date  . A-fib ([place])   . Hypertension    Assessment: 76 y/o F with a f/o atiral fibrillation admitted with SOB  due to CAP. Patient completed a course of Levaquin as an outpatient and was started on aztreonam and vancomycin on admission. Vancomycin d/c with negative MRSA PCR. Plan is to transition patient to meropenem to broaden coverage.    Plan:  Meropenem 1 g iv q 8 hours.   Ulice Dash D 08/06/2016,4:31 PM

## 2016-08-06 NOTE — Progress Notes (Signed)
Patient transferred from 1A today for afib RVR. Cardizem gtt started, HR ranging from 80's-110's, still in afib. Patient c/o shoulder pain x1, tramadol given. No other complaints. Adequate UOP. Family updated on plan of care. Will continue to assess. Hannah Faulkner

## 2016-08-06 NOTE — Consult Note (Addendum)
Name: Hannah Faulkner MRN: [id number] DOB: 05-14-1940    ADMISSION DATE:  08/04/2016 CONSULTATION DATE: 08/06/2016  REFERRING MD : Dr. Ether Griffins  CHIEF COMPLAINT: Shortness of Breath and Afibb with RVR  BRIEF PATIENT DESCRIPTION:  76 yo female 05/14 with acute respiratory failure secondary to CAP transferred to [facility] due to afibb with RVR  SIGNIFICANT EVENTS  05/14-Pt admitted to [facility] unit 05/16-Pt transferred to [facility]   STUDIES:  CT Chest 05/16>>Dense right lower lobe consolidation with patchy consolidation elsewhere in both lungs compatible with pneumonia. Small pleural effusions, right larger than left. Findings suggesting pulmonary arterial hypertension. Morphologic liver changes which may reflect underlying cirrhosis. Aortic Atherosclerosis (ICD10-I70.0).  HISTORY OF PRESENT ILLNESS:   This is a 76 yo female with a PMH of HTN, Depression, Anxiety, Type II DM, Hyperlipidemia, OSA (CPAP) and Atrial Fibrillation.  She presented to [facility] ER 05/16 with c/o chills, shortness of breath, dyspnea on exertion, nonproductive cough, and decrease in functional ability onset of symptoms a few days prior to presentation to ER.  Per ER notes she was being treated with levaquin as an outpatient and completed the full course of abx.  In the ER CXR revealed bilateral pneumonia or aspiration.  She was subsequently admitted to [facility] unit 05/14 for further workup and treatment.  On 05/16 she developed afibb with rvr was given 5 mg iv metoprolol x1, however heart rate did not improve.  Therefore, she was transferred to the [facility] for a Cardizem gtt.  PAST MEDICAL HISTORY :   has a past medical history of A-fib ([place]) and Hypertension.  has a past surgical history that includes Polypectomy and Eye surgery. Prior to Admission medications   Medication Sig Start Date End Date Taking? Authorizing Provider  ALPRAZolam (XANAX) 0.5 MG tablet TAKE 1 TABLET BY MOUTH AT BEDTIME AS NEEDED  FOR ANXIETY Patient taking differently: TAKE 1/2 TABLET BY MOUTH AT BEDTIME AS NEEDED FOR ANXIETY 01/08/16  Yes Jerrol Banana., MD  cyanocobalamin 1000 MCG tablet Take 1,000 mcg by mouth daily.   Yes [provider]  diltiazem (CARDIZEM) 120 MG tablet Take 120 mg by mouth 2 times daily at 12 noon and 4 pm.    Yes [provider]  gabapentin (NEURONTIN) 100 MG capsule Take 2 capsules (200 mg total) by mouth 3 (three) times daily. Patient taking differently: Take 100 mg by mouth 3 (three) times daily.  05/14/16  Yes Jerrol Banana., MD  hydrALAZINE (APRESOLINE) 25 MG tablet TAKE 1 TABLET BY MOUTH 3 TIMES A DAY Patient taking differently: TAKE 1 TABLET BY MOUTH TWICE DAILY 04/08/16  Yes Jerrol Banana., MD  levofloxacin (LEVAQUIN) 500 MG tablet Take 1 tablet (500 mg total) by mouth daily. 07/31/16  Yes Jerrol Banana., MD  metoprolol (LOPRESSOR) 50 MG tablet Take 50 mg by mouth 2 (two) times daily.  11/16/13  Yes [provider]  potassium chloride (K-DUR) 10 MEQ tablet Take 10 mEq by mouth daily.  11/16/13  Yes [provider]  PROAIR HFA 108 (90 Base) MCG/ACT inhaler INHALE 2 INHALATIONS INTO THE LUNGS EVERY 6 (SIX) HOURS AS NEEDED FOR WHEEZING. 04/04/15  Yes [provider]  Pyridoxine HCl (VITAMIN B6) 200 MG TABS Take by mouth. 11/16/13  Yes [provider]  triamterene-hydrochlorothiazide (MAXZIDE-25) 37.5-25 MG tablet TAKE 1 TABLET BY MOUTH EVERY DAY 07/31/15  Yes Jerrol Banana., MD  warfarin (COUMADIN) 2 MG tablet Take 2 mg by  mouth every other day. Take 2mg  one day and take 2.5mg  next day, alternating.   Yes [provider]  warfarin (COUMADIN) 2.5 MG tablet Take 2.5 mg by mouth every other day. Take 2mg  one day and take 2.5mg  next day, alternating. 11/16/13  Yes [provider]  flecainide (TAMBOCOR) 50 MG tablet Take 50 mg by mouth daily.  11/16/13   [provider]   Allergies    Allergen Reactions  . Ampicillin   . Penicillins   . Prinzide  [Lisinopril-Hydrochlorothiazide]     FAMILY HISTORY:  family history includes Atrial fibrillation in her sister; CVA in her mother and sister; Dementia in her sister; Diabetes in her sister; Heart attack in her mother and sister; Heart disease in her mother; Hypertension in her mother; Liver cancer in her brother; Lung cancer in her father; Thyroid disease in her sister, sister, and sister. SOCIAL HISTORY:  reports that she has never smoked. She has never used smokeless tobacco. She reports that she does not drink alcohol or use drugs.  REVIEW OF SYSTEMS:  Positives in BOLD Constitutional: Negative for fever, chills, weight loss, malaise/fatigue and diaphoresis.  HENT: Negative for hearing loss, ear pain, nosebleeds, congestion, sore throat, neck pain, tinnitus and ear discharge.   Eyes: Negative for blurred vision, double vision, photophobia, pain, discharge and redness.  Respiratory: cough, hemoptysis, sputum production, shortness of breath, wheezing and stridor.   Cardiovascular: chest pain, palpitations, orthopnea, claudication, leg swelling and PND.  Gastrointestinal: Negative for heartburn, nausea, vomiting, abdominal pain, diarrhea, constipation, blood in stool and melena.  Genitourinary: Negative for dysuria, urgency, frequency, hematuria and flank pain.  Musculoskeletal: Negative for myalgias, back pain, joint pain and falls.  Skin: Negative for itching and rash.  Neurological: anxiety, dizziness, tingling, tremors, sensory change, speech change, focal weakness, seizures, loss of consciousness, weakness and headaches.  Endo/Heme/Allergies: Negative for environmental allergies and polydipsia. Does not bruise/bleed easily.  SUBJECTIVE:  Pt c/o bilateral shoulder pain and palpitations.  She also states she is very anxious  VITAL SIGNS: Temp:  [97.8 F (36.6 C)-98.3 F (36.8 C)] 98.3 F (36.8 C) (05/16 0713) Pulse  Rate:  [61-152] 152 (05/16 1100) Resp:  [20] 20 (05/16 0022) BP: (110-125)/(56-96) 117/79 (05/16 1100) SpO2:  [93 %-96 %] 96 % (05/16 0713)  PHYSICAL EXAMINATION: General: well developed, well nourished Caucasian female  Neuro: alert and oriented, follow commands HEENT: supple, no JVD Cardiovascular: irregular irregular, no M/R/G Lungs: faint rhonchi and diminished throughout, even, non labored Abdomen: +BS x4, soft, non tender, non distended, obese Musculoskeletal: 2+ bilateral lower extremity edema, normal tone Skin: intact no rashes or lesions   Recent Labs Lab 08/04/16 1602  NA 140  K 3.7  CL 101  CO2 28  BUN 15  CREATININE 0.59  GLUCOSE 142*    Recent Labs Lab 08/04/16 1602  HGB 13.2  HCT 39.2  WBC 9.5  PLT 273   Dg Chest 2 View  Result Date: 08/04/2016 CLINICAL DATA:  76 year old female with increased weakness and shortness of breath for 2 weeks. Uncontrolled atrial fibrillation. EXAM: CHEST  2 VIEW COMPARISON:  07/28/2016 and earlier. FINDINGS: Streaky and patchy bibasilar pulmonary opacity with mild progression since 07/28/2016. Stable cardiomegaly and mediastinal contours. No pneumothorax. Pulmonary vascularity appears stable. No definite pleural effusion. Negative visible bowel gas pattern. No acute osseous abnormality identified. Visualized tracheal air column is within normal limits. IMPRESSION: 1. Interval progression of Patchy and confluent bibasilar opacity. This is nonspecific but consider bilateral pneumonia or  aspiration. 2. Stable cardiomegaly. No acute pulmonary edema, and no pleural effusion identified. Electronically Signed   By: Genevie Ann M.D.   On: 08/04/2016 17:18   Ct Chest Wo Contrast  Result Date: 08/06/2016 CLINICAL DATA:  Pneumonia.  Cough and shortness of breath. EXAM: CT CHEST WITHOUT CONTRAST TECHNIQUE: Multidetector CT imaging of the chest was performed following the standard protocol without IV contrast. COMPARISON:  Chest radiographs  08/04/2016 FINDINGS: Cardiovascular: Normal caliber of the thoracic aorta with mild atherosclerotic calcification. Mild enlargement of the main pulmonary artery may reflect underlying pulmonary arterial hypertension. Mild coronary artery calcification. Mild cardiomegaly. No pericardial effusion. Mediastinum/Nodes: There is mild nodular enlargement of the left thyroid lobe and isthmus, with an approximately 1.6 cm nodule extending from the isthmus and with a left lower pole nodule difficult to discretely measure. There is an increased number of small lymph nodes throughout the mediastinum measuring up to 1.1 cm in short axis, likely reactive. No axillary or definite hilar lymphadenopathy is identified on this unenhanced study. Unremarkable esophagus. Lungs/Pleura: There are small pleural effusions, right larger than left. Dense consolidation is present in the right lower lobe with air bronchograms. There are patchy areas of consolidation in the left lower lobe, right middle lobe, and lingula. Minimal nodular opacity is present anteriorly in the right upper lobe. Mosaic attenuation of both upper lobes may reflect mosaic perfusion of vascular origin given evidence of pulmonary hypertension. Upper Abdomen: There is prominence of the lateral segment of the left hepatic lobe which demonstrates a mildly nodular contour. There may also be a recanalized paraumbilical vein. Musculoskeletal: Moderate thoracic disc degeneration. No suspicious osseous lesion. IMPRESSION: 1. Dense right lower lobe consolidation with patchy consolidation elsewhere in both lungs compatible with pneumonia. 2. Small pleural effusions, right larger than left. 3. Findings suggesting pulmonary arterial hypertension. 4. Morphologic liver changes which may reflect underlying cirrhosis. 5.  Aortic Atherosclerosis (ICD10-I70.0). Electronically Signed   By: Logan Bores M.D.   On: 08/06/2016 09:46    ASSESSMENT / PLAN: Acute respiratory failure secondary  to CAP Afibb with RVR Hx: OSA, HTN, Type II DM, and Anxiety  P: Supplemental O2 to maintain O2 sats >92% and CPAP qhs Continue prn bronchodilator therapy Pulmonary hygiene Continue iv lasix  Continue abx  Trend WBC and monitor fever curve Trend PCT Follow cultures  Continuous telemetry monitoring  Trend troponin's Echo pending  Cardizem gtt Discontinue scheduled metoprolol for now  Cardiology consulted appreciate input  SCD's for VTE prophylaxis  Pharmacy consulted for coumadin dosing Trend CBC's and PT/INR Monitor for s/sx of beeding Prn xanax for anxiety  CBG's ac/hs SSI  Marda Stalker, [place] Pager [phone] (please enter 7 digits) [place] Pager [phone] (please enter 7 digits)  Pt seen and examined with NP, agree with findings, assessment, plan. Acute respiratory failure with pneumonia. I personally reviewed CT films from 08/06/16 which showed pneumonia with acute pulm edema and right pleural effusion. Continue empiric abx, increase diuresis, wean down oxygen as tolerated. Course now complicated by afib with RVR; continue cardizem PO and wean down cardizem drip.   Marda Stalker, M.D. 08/06/2016   [place].  I have personally obtained a history, examined the patient, evaluated laboratory and imaging results, formulated the assessment and plan and placed orders. The Patient requires high complexity decision making for assessment and support, frequent evaluation and titration of therapies, application of advanced monitoring technologies and extensive interpretation of multiple databases. The patient has critical illness that could  lead imminently to failure of 1 or more organ systems and requires the highest level of physician preparedness to intervene.  Critical Care Time devoted to patient care services described in this note is 35 minutes and is exclusive of time spent in procedures.

## 2016-08-06 NOTE — Consult Note (Signed)
[facility] Consultation Note  Patient ID: Hannah Faulkner, MRN: [id number], DOB/AGE: [birth year] 76 y.o. Admit date: 08/04/2016   Date of Consult: 08/06/2016 Primary Physician: Jerrol Banana., MD Primary Cardiologist: [place]  Chief Complaint:  Chief Complaint  Patient presents with  . Weakness  . Shortness of Breath   Reason for Consult: atrial fibrillation with rapid ventricular rate  HPI: 76 y.o. female with known essential hypertension mixed hyperlipidemia sleep apnea and paroxysmal nonvalvular atrial fibrillation under excellent control with flecainide as well as metoprolol and diltiazem for which shows she has done fairly well. She did have a recent community-acquired pneumonia for which she needed antibiotics and therefore flecainide was discontinued due to concerns of interactions of the medication. At that time the patient still remained in normal rhythm but had some hypoxia and other issues for which likely cause the new onset of atrial fibrillation with rapid ventricular rate requiring medication management. She was placed on diltiazem drip for which she is better heart rate control at 110 bpm at this time without evidence of myocardial infarction or congestive heart failure. The patient is feeling well at this time and is recovering from a pneumonia fairly well. The patient has had some mild amount of pulmonary edema due to this and was given Lasix for which is helping a great deal. The patient has not currently been on anticoagulation for atrial fibrillation and may need further treatment  Past Medical History:  Diagnosis Date  . A-fib ([place])   . Hypertension       Surgical History:  Past Surgical History:  Procedure Laterality Date  . EYE SURGERY    . POLYPECTOMY     colon poly premoved     Home Meds: Prior to Admission medications   Medication Sig Start Date End Date Taking? Authorizing Provider  ALPRAZolam (XANAX) 0.5 MG tablet TAKE 1 TABLET BY  MOUTH AT BEDTIME AS NEEDED FOR ANXIETY Patient taking differently: TAKE 1/2 TABLET BY MOUTH AT BEDTIME AS NEEDED FOR ANXIETY 01/08/16  Yes Jerrol Banana., MD  cyanocobalamin 1000 MCG tablet Take 1,000 mcg by mouth daily.   Yes [provider]  diltiazem (CARDIZEM) 120 MG tablet Take 120 mg by mouth 2 times daily at 12 noon and 4 pm.    Yes [provider]  gabapentin (NEURONTIN) 100 MG capsule Take 2 capsules (200 mg total) by mouth 3 (three) times daily. Patient taking differently: Take 100 mg by mouth 3 (three) times daily.  05/14/16  Yes Jerrol Banana., MD  hydrALAZINE (APRESOLINE) 25 MG tablet TAKE 1 TABLET BY MOUTH 3 TIMES A DAY Patient taking differently: TAKE 1 TABLET BY MOUTH TWICE DAILY 04/08/16  Yes Jerrol Banana., MD  levofloxacin (LEVAQUIN) 500 MG tablet Take 1 tablet (500 mg total) by mouth daily. 07/31/16  Yes Jerrol Banana., MD  metoprolol (LOPRESSOR) 50 MG tablet Take 50 mg by mouth 2 (two) times daily.  11/16/13  Yes [provider]  potassium chloride (K-DUR) 10 MEQ tablet Take 10 mEq by mouth daily.  11/16/13  Yes [provider]  PROAIR HFA 108 (90 Base) MCG/ACT inhaler INHALE 2 INHALATIONS INTO THE LUNGS EVERY 6 (SIX) HOURS AS NEEDED FOR WHEEZING. 04/04/15  Yes [provider]  Pyridoxine HCl (VITAMIN B6) 200 MG TABS Take by mouth. 11/16/13  Yes [provider]  triamterene-hydrochlorothiazide (MAXZIDE-25) 37.5-25 MG tablet TAKE 1 TABLET BY MOUTH EVERY DAY 07/31/15  Yes Eulas Post  Brooke Bonito., MD  warfarin (COUMADIN) 2 MG tablet Take 2 mg by mouth every other day. Take 2mg  one day and take 2.5mg  next day, alternating.   Yes [provider]  warfarin (COUMADIN) 2.5 MG tablet Take 2.5 mg by mouth every other day. Take 2mg  one day and take 2.5mg  next day, alternating. 11/16/13  Yes [provider]  flecainide (TAMBOCOR) 50 MG tablet Take 50 mg by mouth daily.  11/16/13   [provider]    Inpatient Medications:  . dextromethorphan  30 mg Oral BID   And  . guaiFENesin  600 mg Oral BID  . diltiazem  180 mg Oral BID  . flecainide  50 mg Oral Daily  . furosemide  20 mg Intravenous BID  . gabapentin  100 mg Oral TID  . insulin aspart  0-15 Units Subcutaneous TID WC  . insulin aspart  0-5 Units Subcutaneous QHS  . metoprolol tartrate  50 mg Oral BID  . potassium chloride  40 mEq Oral Daily  . cyanocobalamin  1,000 mcg Oral Daily   . diltiazem (CARDIZEM) infusion 5 mg/hr (08/06/16 1158)  . meropenem (MERREM) IV      Allergies:  Allergies  Allergen Reactions  . Ampicillin   . Penicillins   . Prinzide  [Lisinopril-Hydrochlorothiazide]     Social History   Social History  . Marital status: Widowed    Spouse name: N/A  . Number of children: 3  . Years of education: HS   Occupational History  .  Retired   Social History Main Topics  . Smoking status: Never Smoker  . Smokeless tobacco: Never Used  . Alcohol use No  . Drug use: No  . Sexual activity: Not on file   Other Topics Concern  . Not on file   Social History Narrative  . No narrative on file     Family History  Problem Relation Age of Onset  . Hypertension Mother   . Heart disease Mother   . CVA Mother   . Heart attack Mother   . Lung cancer Father   . Heart attack Sister   . CVA Sister   . Thyroid disease Sister   . Liver cancer Brother   . Thyroid disease Sister   . Thyroid disease Sister   . Diabetes Sister   . Dementia Sister   . Atrial fibrillation Sister      Review of Systems Positive for Shortness of breath cough congestion Negative for: General:  chills, fever, night sweats or weight changes.  Cardiovascular: PND orthopnea syncope dizziness  Dermatological skin lesions rashes Respiratory: Positive for Cough congestion Urologic: Frequent urination urination at night and hematuria Abdominal: negative for nausea, vomiting, diarrhea, bright red blood per  rectum, melena, or hematemesis Neurologic: negative for visual changes, and/or hearing changes  All other systems reviewed and are otherwise negative except as noted above.  Labs:  Recent Labs  08/06/16 0349  TROPONINI <0.03   Lab Results  Component Value Date   WBC 10.5 08/06/2016   HGB 12.7 08/06/2016   HCT 38.1 08/06/2016   MCV 84.3 08/06/2016   PLT 222 08/06/2016    Recent Labs Lab 08/06/16 1131  NA 142  K 3.6  CL 107  CO2 27  BUN 19  CREATININE 0.71  CALCIUM 8.8*  GLUCOSE 138*   Lab Results  Component Value Date   CHOL 148 11/27/2015   HDL 42 11/27/2015   LDLCALC 84 11/27/2015   TRIG 109 11/27/2015  No results found for: DDIMER  Radiology/Studies:  Dg Chest 2 View  Result Date: 08/04/2016 CLINICAL DATA:  76 year old female with increased weakness and shortness of breath for 2 weeks. Uncontrolled atrial fibrillation. EXAM: CHEST  2 VIEW COMPARISON:  07/28/2016 and earlier. FINDINGS: Streaky and patchy bibasilar pulmonary opacity with mild progression since 07/28/2016. Stable cardiomegaly and mediastinal contours. No pneumothorax. Pulmonary vascularity appears stable. No definite pleural effusion. Negative visible bowel gas pattern. No acute osseous abnormality identified. Visualized tracheal air column is within normal limits. IMPRESSION: 1. Interval progression of Patchy and confluent bibasilar opacity. This is nonspecific but consider bilateral pneumonia or aspiration. 2. Stable cardiomegaly. No acute pulmonary edema, and no pleural effusion identified. Electronically Signed   By: Genevie Ann M.D.   On: 08/04/2016 17:18   Dg Chest 2 View  Result Date: 07/28/2016 CLINICAL DATA:  Pneumonia. EXAM: CHEST  2 VIEW COMPARISON:  05/28/2016 FINDINGS: The cardio pericardial silhouette is enlarged. There is pulmonary vascular congestion without overt pulmonary edema. Slight improvement in right base patchy opacity. Left base patchy airspace disease not substantially changed. No  substantial pleural effusion. The visualized bony structures of the thorax are intact. IMPRESSION: Interval improvement in right basilar aeration with stable patchy airspace opacity at the left base. Electronically Signed   By: Misty Stanley M.D.   On: 07/28/2016 17:10   Ct Chest Wo Contrast  Result Date: 08/06/2016 CLINICAL DATA:  Pneumonia.  Cough and shortness of breath. EXAM: CT CHEST WITHOUT CONTRAST TECHNIQUE: Multidetector CT imaging of the chest was performed following the standard protocol without IV contrast. COMPARISON:  Chest radiographs 08/04/2016 FINDINGS: Cardiovascular: Normal caliber of the thoracic aorta with mild atherosclerotic calcification. Mild enlargement of the main pulmonary artery may reflect underlying pulmonary arterial hypertension. Mild coronary artery calcification. Mild cardiomegaly. No pericardial effusion. Mediastinum/Nodes: There is mild nodular enlargement of the left thyroid lobe and isthmus, with an approximately 1.6 cm nodule extending from the isthmus and with a left lower pole nodule difficult to discretely measure. There is an increased number of small lymph nodes throughout the mediastinum measuring up to 1.1 cm in short axis, likely reactive. No axillary or definite hilar lymphadenopathy is identified on this unenhanced study. Unremarkable esophagus. Lungs/Pleura: There are small pleural effusions, right larger than left. Dense consolidation is present in the right lower lobe with air bronchograms. There are patchy areas of consolidation in the left lower lobe, right middle lobe, and lingula. Minimal nodular opacity is present anteriorly in the right upper lobe. Mosaic attenuation of both upper lobes may reflect mosaic perfusion of vascular origin given evidence of pulmonary hypertension. Upper Abdomen: There is prominence of the lateral segment of the left hepatic lobe which demonstrates a mildly nodular contour. There may also be a recanalized paraumbilical vein.  Musculoskeletal: Moderate thoracic disc degeneration. No suspicious osseous lesion. IMPRESSION: 1. Dense right lower lobe consolidation with patchy consolidation elsewhere in both lungs compatible with pneumonia. 2. Small pleural effusions, right larger than left. 3. Findings suggesting pulmonary arterial hypertension. 4. Morphologic liver changes which may reflect underlying cirrhosis. 5.  Aortic Atherosclerosis (ICD10-I70.0). Electronically Signed   By: Logan Bores M.D.   On: 08/06/2016 09:46    EKG: Atrial fibrillation with rapid ventricular rate and nonspecific ST and T-wave changes  Weights: Filed Weights   08/04/16 1556 08/04/16 2228  Weight: 85.7 kg (189 lb) 91.5 kg (201 lb 12.8 oz)     Physical Exam: Blood pressure 126/72, pulse 87, temperature 98.7 F (37.1 C), temperature  source Oral, resp. rate 17, height 5\' 1"  (1.549 m), weight 91.5 kg (201 lb 12.8 oz), SpO2 94 %. Body mass index is 38.13 kg/m. General: Well developed, well nourished, in no acute distress. Head eyes ears nose throat: Normocephalic, atraumatic, sclera non-icteric, no xanthomas, nares are without discharge. No apparent thyromegaly and/or mass  Lungs: Normal respiratory effort.  Few wheezes, no rales, diffuse rhonchi.  Heart: Irregular with normal S1 S2. no murmur gallop, no rub, PMI is normal size and placement, carotid upstroke normal without bruit, jugular venous pressure is normal Abdomen: Soft, non-tender, non-distended with normoactive bowel sounds. No hepatomegaly. No rebound/guarding. No obvious abdominal masses. Abdominal aorta is normal size without bruit Extremities: Trace edema. no cyanosis, no clubbing, no ulcers  Peripheral : 2+ bilateral upper extremity pulses, 2+ bilateral femoral pulses, 2+ bilateral dorsal pedal pulse Neuro: Alert and oriented. No facial asymmetry. No focal deficit. Moves all extremities spontaneously. Musculoskeletal: Normal muscle tone without kyphosis Psych:  Responds to  questions appropriately with a normal affect.    Assessment: 76 year old female with essential hypertension mixed hyperlipidemia and paroxysmal nonvalvular atrial fibrillation now with rapid ventricular rate causing some pulmonary edema now better controlled on diltiazem drip without evidence of myocardial infarction   Plan: 1. Continue diltiazem drip along with diltiazem orally and add metoprolol orally for better heart rate control of atrial fibrillation until patient can have some further medical management of her recent pneumonia 2. Anticoagulation with heparin and additionally oral and anticoagulation as outpatient for further risk reduction in stroke with atrial fibrillation 3. Furosemide for pulmonary edema 4. No further cardiac diagnostics necessary at this time due to no evidence of myocardial infarction and patient slowly improving 5. Further adjustments of medication management after better control and recovery  Signed, Corey Skains M.D. [facility] 08/06/2016, 5:00 PM

## 2016-08-06 NOTE — Progress Notes (Signed)
ANTICOAGULATION CONSULT NOTE - Initial Consult  Pharmacy Consult for warfarin dosing Indication: atrial fibrillation  Allergies  Allergen Reactions  . Ampicillin   . Penicillins   . Prinzide  [Lisinopril-Hydrochlorothiazide]     Patient Measurements: Height: 5\' 1"  (154.9 cm) Weight: 201 lb 12.8 oz (91.5 kg) IBW/kg (Calculated) : 47.8  Vital Signs: Temp: 98.3 F (36.8 C) (05/16 0713) Temp Source: Oral (05/16 0713) BP: 125/65 (05/16 0713) Pulse Rate: 125 (05/16 0713)  Labs:  Recent Labs  08/04/16 1602 08/05/16 0434 08/06/16 0349  HGB 13.2  --   --   HCT 39.2  --   --   PLT 273  --   --   LABPROT 25.1* 24.6* 36.2*  INR 2.23 2.18 3.53  CREATININE 0.59  --   --     Estimated Creatinine Clearance: 62.6 mL/min (by C-G formula based on SCr of 0.59 mg/dL).   Medical History: Past Medical History:  Diagnosis Date  . A-fib ([place])   . Hypertension     Medications:  Pharmacy consulted to dose and monitor warfarin in this 76 year old female who was taking warfarin prior to admission for a history of atrial fibrillation.   INR of 2.23 was therapeutic on admission Last dose PTA was on 5/13  Home regimen:   Warfarin 2 mg and 2.5 mg taken on alternating days  Dosing history: Date INR Dose 5/14 2.23 2.5 mg dose given early AM on 5/15 during admission 5/15 2.18 2 mg 5/16 3.53   Goal of Therapy:  INR 2-3  Plan:  Will hold warfarin today given supratherapeutic INR. Will recheck INR and CBC with AM labs tomorrow.  Lenis Noon, PharmD, BCPS Clinical Pharmacist 08/06/2016,10:46 AM

## 2016-08-07 ENCOUNTER — Inpatient Hospital Stay
Admit: 2016-08-07 | Discharge: 2016-08-07 | Disposition: A | Discharge status: Home / Self Care | Payer: Medicare Other | Attending: Critical Care Medicine | Admitting: Critical Care Medicine

## 2016-08-07 DIAGNOSIS — J90 Pleural effusion, not elsewhere classified: Secondary | ICD-10-CM

## 2016-08-07 LAB — PROTIME-INR
INR: 3.87
Prothrombin Time: 39 seconds — ABNORMAL HIGH (ref 11.4–15.2)

## 2016-08-07 LAB — CBC
HCT: 33.5 % — ABNORMAL LOW (ref 35.0–47.0)
Hemoglobin: 11.2 g/dL — ABNORMAL LOW (ref 12.0–16.0)
MCH: 28.5 pg (ref 26.0–34.0)
MCHC: 33.5 g/dL (ref 32.0–36.0)
MCV: 85.1 fL (ref 80.0–100.0)
PLATELETS: 182 10*3/uL (ref 150–440)
RBC: 3.94 MIL/uL (ref 3.80–5.20)
RDW: 15.2 % — AB (ref 11.5–14.5)
WBC: 8.4 10*3/uL (ref 3.6–11.0)

## 2016-08-07 LAB — BASIC METABOLIC PANEL
Anion gap: 7 (ref 5–15)
BUN: 23 mg/dL — AB (ref 6–20)
CALCIUM: 8.2 mg/dL — AB (ref 8.9–10.3)
CO2: 30 mmol/L (ref 22–32)
CREATININE: 0.67 mg/dL (ref 0.44–1.00)
Chloride: 107 mmol/L (ref 101–111)
GFR calc non Af Amer: >60 mL/min (ref >60)
Glucose, Bld: 110 mg/dL — ABNORMAL HIGH (ref 65–99)
Potassium: 3.2 mmol/L — ABNORMAL LOW (ref 3.5–5.1)
Sodium: 144 mmol/L (ref 135–145)

## 2016-08-07 LAB — GLUCOSE, CAPILLARY
GLUCOSE-CAPILLARY: 137 mg/dL — AB (ref 65–99)
GLUCOSE-CAPILLARY: 115 mg/dL — AB (ref 65–99)
Glucose-Capillary: 106 mg/dL — ABNORMAL HIGH (ref 65–99)
Glucose-Capillary: 117 mg/dL — ABNORMAL HIGH (ref 65–99)

## 2016-08-07 LAB — MAGNESIUM: Magnesium: 2.1 mg/dL (ref 1.7–2.4)

## 2016-08-07 LAB — PROCALCITONIN: Procalcitonin: <0.1 ng/mL

## 2016-08-07 LAB — EXPECTORATED SPUTUM ASSESSMENT W GRAM STAIN, RFLX TO RESP C: Special Requests: NORMAL

## 2016-08-07 LAB — EXPECTORATED SPUTUM ASSESSMENT W REFEX TO RESP CULTURE

## 2016-08-07 MED ORDER — POTASSIUM CHLORIDE CRYS ER 20 MEQ PO TBCR
20.0000 meq | EXTENDED_RELEASE_TABLET | Freq: Once | ORAL | Status: AC
  Administered 2016-08-07: 20 meq via ORAL
  Filled 2016-08-07: qty 1

## 2016-08-07 NOTE — Progress Notes (Signed)
[facility] Cardiology [facility] Encounter Note  Patient: Hannah Faulkner / Admit Date: 08/04/2016 / Date of Encounter: 08/07/2016, 8:56 AM   Subjective: Patient feeling much better today with less cough congestion shortness of breath. Heart rate much better control with oral medication management combination but still in atrial fibrillation. No evidence of heart failure or myocardial infarction  Review of Systems: Positive for: Shortness of breath cough congestion Negative for: Vision change, hearing change, syncope, dizziness, nausea, vomiting,diarrhea, bloody stool, stomach pain, positive for cough, congestion, negative for diaphoresis, urinary frequency, urinary pain,skin lesions, skin rashes Others previously listed  Objective: Telemetry: Atrial fibrillation with rapid ventricular rate Physical Exam: Blood pressure 99/66, pulse 86, temperature 98.8 F (37.1 C), temperature source Axillary, resp. rate 20, height 5\' 1"  (1.549 m), weight 91.5 kg (201 lb 12.8 oz), SpO2 94 %. Body mass index is 38.13 kg/m. General: Well developed, well nourished, in no acute distress. Head: Normocephalic, atraumatic, sclera non-icteric, no xanthomas, nares are without discharge. Neck: No apparent masses Lungs: Normal respirations with diffuse wheezes, some rhonchi, no rales , no crackles   Heart: Irregular rate and rhythm, normal S1 S2, no murmur, no rub, no gallop, PMI is normal size and placement, carotid upstroke normal without bruit, jugular venous pressure normal Abdomen: Soft, non-tender, non-distended with normoactive bowel sounds. No hepatosplenomegaly. Abdominal aorta is normal size without bruit Extremities: Trace edema, no clubbing, no cyanosis, no ulcers,  Peripheral: 2+ radial, 2+ femoral, 2+ dorsal pedal pulses Neuro: Alert and oriented. Moves all extremities spontaneously. Psych:  Responds to questions appropriately with a normal affect.   Intake/Output Summary (Last 24 hours) at  08/07/16 0856 Last data filed at 08/07/16 0300  Gross per 24 hour  Intake           324.67 ml  Output              750 ml  Net          -425.33 ml    Inpatient Medications:  . dextromethorphan  30 mg Oral BID   And  . guaiFENesin  600 mg Oral BID  . diltiazem  180 mg Oral BID  . furosemide  20 mg Intravenous BID  . gabapentin  100 mg Oral TID  . insulin aspart  0-15 Units Subcutaneous TID WC  . insulin aspart  0-5 Units Subcutaneous QHS  . metoprolol tartrate  50 mg Oral BID  . potassium chloride  40 mEq Oral Daily  . cyanocobalamin  1,000 mcg Oral Daily   Infusions:  . diltiazem (CARDIZEM) infusion Stopped (08/06/16 2330)  . meropenem (MERREM) IV 1 g (08/07/16 0546)    Labs:  Recent Labs  08/06/16 1131 08/07/16 0600  NA 142 144  K 3.6 3.2*  CL 107 107  CO2 27 30  GLUCOSE 138* 110*  BUN 19 23*  CREATININE 0.71 0.67  CALCIUM 8.8* 8.2*   No results for input(s): AST, ALT, ALKPHOS, BILITOT, PROT, ALBUMIN in the last 72 hours.  Recent Labs  08/06/16 1131 08/07/16 0600  WBC 10.5 8.4  NEUTROABS 7.9*  --   HGB 12.7 11.2*  HCT 38.1 33.5*  MCV 84.3 85.1  PLT 222 182    Recent Labs  08/06/16 0349  TROPONINI <0.03   Invalid input(s): POCBNP No results for input(s): HGBA1C in the last 72 hours.   Weights: Filed Weights   08/04/16 1556 08/04/16 2228  Weight: 85.7 kg (189 lb) 91.5 kg (201 lb 12.8 oz)     Radiology/Studies:  Dg Chest 2 View  Result Date: 08/04/2016 CLINICAL DATA:  76 year old female with increased weakness and shortness of breath for 2 weeks. Uncontrolled atrial fibrillation. EXAM: CHEST  2 VIEW COMPARISON:  07/28/2016 and earlier. FINDINGS: Streaky and patchy bibasilar pulmonary opacity with mild progression since 07/28/2016. Stable cardiomegaly and mediastinal contours. No pneumothorax. Pulmonary vascularity appears stable. No definite pleural effusion. Negative visible bowel gas pattern. No acute osseous abnormality identified. Visualized  tracheal air column is within normal limits. IMPRESSION: 1. Interval progression of Patchy and confluent bibasilar opacity. This is nonspecific but consider bilateral pneumonia or aspiration. 2. Stable cardiomegaly. No acute pulmonary edema, and no pleural effusion identified. Electronically Signed   By: Genevie Ann M.D.   On: 08/04/2016 17:18   Dg Chest 2 View  Result Date: 07/28/2016 CLINICAL DATA:  Pneumonia. EXAM: CHEST  2 VIEW COMPARISON:  05/28/2016 FINDINGS: The cardio pericardial silhouette is enlarged. There is pulmonary vascular congestion without overt pulmonary edema. Slight improvement in right base patchy opacity. Left base patchy airspace disease not substantially changed. No substantial pleural effusion. The visualized bony structures of the thorax are intact. IMPRESSION: Interval improvement in right basilar aeration with stable patchy airspace opacity at the left base. Electronically Signed   By: Misty Stanley M.D.   On: 07/28/2016 17:10   Ct Chest Wo Contrast  Result Date: 08/06/2016 CLINICAL DATA:  Pneumonia.  Cough and shortness of breath. EXAM: CT CHEST WITHOUT CONTRAST TECHNIQUE: Multidetector CT imaging of the chest was performed following the standard protocol without IV contrast. COMPARISON:  Chest radiographs 08/04/2016 FINDINGS: Cardiovascular: Normal caliber of the thoracic aorta with mild atherosclerotic calcification. Mild enlargement of the main pulmonary artery may reflect underlying pulmonary arterial hypertension. Mild coronary artery calcification. Mild cardiomegaly. No pericardial effusion. Mediastinum/Nodes: There is mild nodular enlargement of the left thyroid lobe and isthmus, with an approximately 1.6 cm nodule extending from the isthmus and with a left lower pole nodule difficult to discretely measure. There is an increased number of small lymph nodes throughout the mediastinum measuring up to 1.1 cm in short axis, likely reactive. No axillary or definite hilar  lymphadenopathy is identified on this unenhanced study. Unremarkable esophagus. Lungs/Pleura: There are small pleural effusions, right larger than left. Dense consolidation is present in the right lower lobe with air bronchograms. There are patchy areas of consolidation in the left lower lobe, right middle lobe, and lingula. Minimal nodular opacity is present anteriorly in the right upper lobe. Mosaic attenuation of both upper lobes may reflect mosaic perfusion of vascular origin given evidence of pulmonary hypertension. Upper Abdomen: There is prominence of the lateral segment of the left hepatic lobe which demonstrates a mildly nodular contour. There may also be a recanalized paraumbilical vein. Musculoskeletal: Moderate thoracic disc degeneration. No suspicious osseous lesion. IMPRESSION: 1. Dense right lower lobe consolidation with patchy consolidation elsewhere in both lungs compatible with pneumonia. 2. Small pleural effusions, right larger than left. 3. Findings suggesting pulmonary arterial hypertension. 4. Morphologic liver changes which may reflect underlying cirrhosis. 5.  Aortic Atherosclerosis (ICD10-I70.0). Electronically Signed   By: Logan Bores M.D.   On: 08/06/2016 09:46     Assessment and Recommendation  76 y.o. female with the acute pneumonia and hypoxia causing paroxysmal nonvalvular atrial fibrillation with rapid ventricular rate now improving with appropriate medication management without evidence of heart failure and/or myocardial infarction 1. Continue supportive care of bronchitis respiratory failure with inhalers and antibiotics 2. Continue oral medication management including higher dose metoprolol and diltiazem  for heart rate control between 60 and 90 bpm 3. Anticoagulation with heparin and other use of anticoagulation orally for further risk reduction of stroke with atrial fibrillation 4. Begin ambulation and follow for need in adjustments of medication management for heart rate  control X 5. No further cardiac diagnostics necessary at this time 6. Possible return to floor for recovery from above  Signed, Serafina Royals M.D. FACC

## 2016-08-07 NOTE — Progress Notes (Signed)
[facility] [place] NAME: Hannah Faulkner    MR#:  [id number]  DATE OF BIRTH:  Apr 10, 1940  SUBJECTIVE:  CHIEF COMPLAINT:   Chief Complaint  Patient presents with  . Weakness  . Shortness of Breath  The patient is 76 year old female with past medical history significant for history of A. fib, hypertension, anxiety, depression, diabetes, hyperlipidemia, recently treated for community acquired pneumonia with levofloxacin, who presents to the hospital with shortness of breath, decreased functional capacity, chills, dyspnea, nonproductive cough. On arrival to the hospital, she was noted to have worsening pneumonia, she was admitted to the hospital for further evaluation, treatment, initiated on broad-spectrum antibiotic therapy. The patient developed A. fib, RVR with heart rate to 160. Today, developing significant shortness of breath/dyspnea, requiring Lasix administration. It did not respond to intravenous metoprolol, and patient was transferred to intensive care unit for intravenous Cardizem drip. Flecainide was restarted. Patient feels good today, she is weaned off Cardizem drip. Heart rate remained stable in 90s. Patient denies any chest pains, admits of some cough, some grayish sputum production.patient's Propulsid, iron was low, antibiotics were discontinued, despite patient's CT scan revealing dense consolidation in the right lower lobe.CT also revealed pulmonary hypertension, pleural effusions bilaterally.Lasix was initiated  Review of Systems  Constitutional: Positive for malaise/fatigue. Negative for chills, fever and weight loss.  HENT: Negative for congestion.   Eyes: Negative for blurred vision and double vision.  Respiratory: Positive for cough and shortness of breath. Negative for sputum production and wheezing.   Cardiovascular: Negative for chest pain, palpitations, orthopnea, leg swelling and PND.  Gastrointestinal: Positive for  diarrhea. Negative for abdominal pain, blood in stool, constipation, nausea and vomiting.  Genitourinary: Negative for dysuria, frequency, hematuria and urgency.  Musculoskeletal: Negative for falls.  Neurological: Negative for dizziness, tremors, focal weakness and headaches.  Endo/Heme/Allergies: Does not bruise/bleed easily.  Psychiatric/Behavioral: Negative for depression. The patient does not have insomnia.     VITAL SIGNS: Blood pressure 117/79, pulse (!) 108, temperature 98.8 F (37.1 C), temperature source Axillary, resp. rate (!) 22, height 5\' 1"  (1.549 m), weight 91.5 kg (201 lb 12.8 oz), SpO2 93 %.  PHYSICAL EXAMINATION:   GENERAL:  76 y.o.-year-old patient lying in the bed in no distress.  EYES: Pupils equal, round, reactive to light and accommodation. No scleral icterus. Extraocular muscles intact.  HEENT: Head atraumatic, normocephalic. Oropharynx and nasopharynx clear.  NECK:  Supple, jugular venous distention. No thyroid enlargement, no tenderness.  LUNGS: Diminished breath sounds bilaterally, no wheezing, bilateral basilar  Crepitations were  noted ,   Not using accessory muscles of respiration .  CARDIOVASCULAR: S1, S2 irregularly irregular, heart rate is better controlled. No murmurs, rubs, or gallops.  ABDOMEN: Soft, nontender, nondistended. Bowel sounds present. No organomegaly or mass.  EXTREMITIES: Trace lower extremity and pedal edema, no cyanosis, or clubbing.  NEUROLOGIC: Cranial nerves II through XII are intact. Muscle strength 5/5 in all extremities. Sensation intact. Gait not checked.  PSYCHIATRIC: The patient is alert and oriented x 3.  SKIN: No obvious rash, lesion, or ulcer.   ORDERS/RESULTS REVIEWED:   CBC  Recent Labs Lab 08/04/16 1602 08/06/16 1131 08/07/16 0600  WBC 9.5 10.5 8.4  HGB 13.2 12.7 11.2*  HCT 39.2 38.1 33.5*  PLT 273 222 182  MCV 84.8 84.3 85.1  MCH 28.5 28.0 28.5  MCHC 33.6 33.3 33.5  RDW 15.5* 15.6* 15.2*  LYMPHSABS  --  1.6   --  MONOABS  --  0.9  --   EOSABS  --  0.1  --   BASOSABS  --  0.1  --    ------------------------------------------------------------------------------------------------------------------  Chemistries   Recent Labs Lab 08/04/16 1602 08/06/16 1131 08/07/16 0600  NA 140 142 144  K 3.7 3.6 3.2*  CL 101 107 107  CO2 28 27 30   GLUCOSE 142* 138* 110*  BUN 15 19 23*  CREATININE 0.59 0.71 0.67  CALCIUM 9.0 8.8* 8.2*  MG  --   --  2.1   ------------------------------------------------------------------------------------------------------------------ estimated creatinine clearance is 62.6 mL/min (by C-G formula based on SCr of 0.67 mg/dL). ------------------------------------------------------------------------------------------------------------------ No results for input(s): TSH, T4TOTAL, T3FREE, THYROIDAB in the last 72 hours.  Invalid input(s): FREET3  Cardiac Enzymes  Recent Labs Lab 08/06/16 0349  TROPONINI <0.03   ------------------------------------------------------------------------------------------------------------------ Invalid input(s): POCBNP ---------------------------------------------------------------------------------------------------------------  RADIOLOGY: Ct Chest Wo Contrast  Result Date: 08/06/2016 CLINICAL DATA:  Pneumonia.  Cough and shortness of breath. EXAM: CT CHEST WITHOUT CONTRAST TECHNIQUE: Multidetector CT imaging of the chest was performed following the standard protocol without IV contrast. COMPARISON:  Chest radiographs 08/04/2016 FINDINGS: Cardiovascular: Normal caliber of the thoracic aorta with mild atherosclerotic calcification. Mild enlargement of the main pulmonary artery may reflect underlying pulmonary arterial hypertension. Mild coronary artery calcification. Mild cardiomegaly. No pericardial effusion. Mediastinum/Nodes: There is mild nodular enlargement of the left thyroid lobe and isthmus, with an approximately 1.6 cm nodule  extending from the isthmus and with a left lower pole nodule difficult to discretely measure. There is an increased number of small lymph nodes throughout the mediastinum measuring up to 1.1 cm in short axis, likely reactive. No axillary or definite hilar lymphadenopathy is identified on this unenhanced study. Unremarkable esophagus. Lungs/Pleura: There are small pleural effusions, right larger than left. Dense consolidation is present in the right lower lobe with air bronchograms. There are patchy areas of consolidation in the left lower lobe, right middle lobe, and lingula. Minimal nodular opacity is present anteriorly in the right upper lobe. Mosaic attenuation of both upper lobes may reflect mosaic perfusion of vascular origin given evidence of pulmonary hypertension. Upper Abdomen: There is prominence of the lateral segment of the left hepatic lobe which demonstrates a mildly nodular contour. There may also be a recanalized paraumbilical vein. Musculoskeletal: Moderate thoracic disc degeneration. No suspicious osseous lesion. IMPRESSION: 1. Dense right lower lobe consolidation with patchy consolidation elsewhere in both lungs compatible with pneumonia. 2. Small pleural effusions, right larger than left. 3. Findings suggesting pulmonary arterial hypertension. 4. Morphologic liver changes which may reflect underlying cirrhosis. 5.  Aortic Atherosclerosis (ICD10-I70.0). Electronically Signed   By: Logan Bores M.D.   On: 08/06/2016 09:46    EKG:  Orders placed or performed during the hospital encounter of 08/04/16  . EKG 12-Lead  . EKG 12-Lead  . ED EKG within 10 minutes  . ED EKG within 10 minutes    ASSESSMENT AND PLAN:  Active Problems:   Community acquired pneumonia    #1. Acute respiratory failure with hypoxia, patient was not on oxygen therapy at home, now on 2 L, continue oxygen, wean off as tolerated, patient was initiated on intravenous Lasix , following consent outs. Patient was seen by  pulmonologist, who did not feel that she had pneumonia, he discontinued on antibiotic therapy, following clinically. Discussed with Dr. Ashby Dawes  #2, A. fib, RVR, now off Cardizem IV drip, continue Cardizem, metoprolol, Flecainide, appreciate cardiologist  recommendations, troponin was negative. Coumadin dosing as per pharmacy. Echocardiogram  is pending, continue Lasix #3. Essential hypertension, patient's blood pressure is intermittently low, now, off hydralazine, continue Cardizem and metoprolol, relatively stable #4 diarrheal stool, negative for C. difficile, gastrointestinal panel is negative, diarrheal stool subsided #5. Hypokalemia, supplement orally, follow in the morning Management plans discussed with the patient, family and they are in agreement.   DRUG ALLERGIES:  Allergies  Allergen Reactions  . Ampicillin   . Penicillins   . Prinzide  [Lisinopril-Hydrochlorothiazide]     CODE STATUS:     Code Status Orders        Start     Ordered   08/04/16 2222  Full code  Continuous     08/04/16 2221    Code Status History    Date Active Date Inactive Code Status Order ID Comments User Context   02/29/2016  8:53 PM 03/03/2016  2:03 PM Full Code [id number]  Henreitta Leber, MD Inpatient      TOTAL TIME TAKING CARE OF THIS PATIENT: 35 minutes.  Discussed with Dr. Henri Medal M.D on 08/07/2016 at 4:43 PM  Between 7am to 6pm - Pager - [phone]  After 6pm go to [email or web address] - password [email or web address] [facility]  [facility] [place] [facility]  Office  [phone]  CC: Primary care physician; Jerrol Banana., MD

## 2016-08-07 NOTE — Progress Notes (Signed)
ANTICOAGULATION CONSULT NOTE - Initial Consult  Pharmacy Consult for warfarin dosing Indication: atrial fibrillation  Allergies  Allergen Reactions  . Ampicillin   . Penicillins   . Prinzide  [Lisinopril-Hydrochlorothiazide]     Patient Measurements: Height: 5\' 1"  (154.9 cm) Weight: 201 lb 12.8 oz (91.5 kg) IBW/kg (Calculated) : 47.8  Vital Signs: Temp: 98.8 F (37.1 C) (05/17 0822) Temp Source: Axillary (05/17 0822) BP: 117/79 (05/17 1000) Pulse Rate: 108 (05/17 1000)  Labs:  Recent Labs  08/05/16 0434 08/06/16 0349 08/06/16 1131 08/07/16 0600  HGB  --   --  12.7 11.2*  HCT  --   --  38.1 33.5*  PLT  --   --  222 182  LABPROT 24.6* 36.2*  --  39.0*  INR 2.18 3.53  --  3.87  CREATININE  --   --  0.71 0.67  TROPONINI  --  <0.03  --   --     Estimated Creatinine Clearance: 62.6 mL/min (by C-G formula based on SCr of 0.67 mg/dL).   Medical History: Past Medical History:  Diagnosis Date  . A-fib ([place])   . Hypertension     Medications:  Pharmacy consulted to dose and monitor warfarin in this 76 year old female who was taking warfarin prior to admission for a history of atrial fibrillation.   INR of 2.23 was therapeutic on admission Last dose PTA was on 5/13  Home regimen:   Warfarin 2 mg and 2.5 mg taken on alternating days  Dosing history: Date INR Dose 5/14 2.23 2.5 mg dose given early AM on 5/15 during admission 5/15 2.18 2 mg 5/16 3.53  5/17 3.87   Goal of Therapy:  INR 2-3  Plan:  Will hold warfarin today given supratherapeutic INR. Will recheck INR and CBC with AM labs tomorrow.  Napoleon Form, PharmD, BCPS Clinical Pharmacist 08/07/2016,4:57 PM

## 2016-08-07 NOTE — Progress Notes (Signed)
Report given to [place]. Patient alert and oriented. 2 liters oxygen. Up to bedside commode with assistance. Tolerating diet. Replaced potassium. Patient being tx to floor.

## 2016-08-07 NOTE — Progress Notes (Signed)
*  PRELIMINARY RESULTS* Echocardiogram 2D Echocardiogram has been performed.  Hannah Faulkner 08/07/2016, 8:06 AM

## 2016-08-07 NOTE — Progress Notes (Signed)
Name: Hannah Faulkner MRN: [id number] DOB: [birth year]    ADMISSION DATE:  08/04/2016 CONSULTATION DATE: 08/06/2016  REFERRING MD : Dr. Ether Griffins  CHIEF COMPLAINT: Shortness of Breath and Afib with RVR  BRIEF PATIENT DESCRIPTION:  76 yo female 05/14 with acute respiratory failure secondary to CAP transferred to [facility] due to afib with RVR  SIGNIFICANT EVENTS  05/14-Pt admitted to [facility] unit 05/16-Pt transferred to [facility]   STUDIES:  CT Chest 05/16>>Dense right lower lobe consolidation with patchy consolidation elsewhere in both lungs compatible with pneumonia. Small pleural effusions, right larger than left. Findings suggesting pulmonary arterial hypertension. Morphologic liver changes which may reflect underlying cirrhosis. Aortic Atherosclerosis (ICD10-I70.0).  Subjective: Pt is feeling better today, breathing is improved.   REVIEW OF SYSTEMS:  Positives in BOLD Constitutional: Negative for fever, chills, weight loss, malaise/fatigue and diaphoresis.  HENT: Negative for hearing loss, ear pain, nosebleeds, congestion, sore throat, neck pain, tinnitus and ear discharge.   Eyes: Negative for blurred vision, double vision, photophobia, pain, discharge and redness.  Respiratory: cough, hemoptysis, sputum production, shortness of breath, wheezing and stridor.   Cardiovascular: chest pain, palpitations, orthopnea, claudication, leg swelling and PND.  Gastrointestinal: Negative for heartburn, nausea, vomiting, abdominal pain, diarrhea, constipation, blood in stool and melena.  Genitourinary: Negative for dysuria, urgency, frequency, hematuria and flank pain.  Musculoskeletal: Negative for myalgias, back pain, joint pain and falls.  Skin: Negative for itching and rash.  Neurological: anxiety, dizziness, tingling, tremors, sensory change, speech change, focal weakness, seizures, loss of consciousness, weakness and headaches.  Endo/Heme/Allergies: Negative for environmental  allergies and polydipsia. Does not bruise/bleed easily.    VITAL SIGNS: Temp:  [97.8 F (36.6 C)-98.8 F (37.1 C)] 98.8 F (37.1 C) (05/17 0822) Pulse Rate:  [73-138] 108 (05/17 1000) Resp:  [14-29] 22 (05/17 1000) BP: (99-141)/(66-106) 117/79 (05/17 1000) SpO2:  [91 %-96 %] 93 % (05/17 1000)  PHYSICAL EXAMINATION: General: well developed, well nourished Caucasian female  Neuro: alert and oriented, follow commands HEENT: supple, no JVD Cardiovascular: irregular irregular, no M/R/G Lungs: faint rhonchi but better, and diminished throughout, even, non labored Abdomen: +BS x4, soft, non tender, non distended, obese Musculoskeletal: 2+ bilateral lower extremity edema, normal tone Skin: intact no rashes or lesions   Recent Labs Lab 08/04/16 1602 08/06/16 1131 08/07/16 0600  NA 140 142 144  K 3.7 3.6 3.2*  CL 101 107 107  CO2 28 27 30   BUN 15 19 23*  CREATININE 0.59 0.71 0.67  GLUCOSE 142* 138* 110*    Recent Labs Lab 08/04/16 1602 08/06/16 1131 08/07/16 0600  HGB 13.2 12.7 11.2*  HCT 39.2 38.1 33.5*  WBC 9.5 10.5 8.4  PLT 273 222 182   Ct Chest Wo Contrast  Result Date: 08/06/2016 CLINICAL DATA:  Pneumonia.  Cough and shortness of breath. EXAM: CT CHEST WITHOUT CONTRAST TECHNIQUE: Multidetector CT imaging of the chest was performed following the standard protocol without IV contrast. COMPARISON:  Chest radiographs 08/04/2016 FINDINGS: Cardiovascular: Normal caliber of the thoracic aorta with mild atherosclerotic calcification. Mild enlargement of the main pulmonary artery may reflect underlying pulmonary arterial hypertension. Mild coronary artery calcification. Mild cardiomegaly. No pericardial effusion. Mediastinum/Nodes: There is mild nodular enlargement of the left thyroid lobe and isthmus, with an approximately 1.6 cm nodule extending from the isthmus and with a left lower pole nodule difficult to discretely measure. There is an increased number of small lymph nodes  throughout the mediastinum measuring up to 1.1 cm in short axis,  likely reactive. No axillary or definite hilar lymphadenopathy is identified on this unenhanced study. Unremarkable esophagus. Lungs/Pleura: There are small pleural effusions, right larger than left. Dense consolidation is present in the right lower lobe with air bronchograms. There are patchy areas of consolidation in the left lower lobe, right middle lobe, and lingula. Minimal nodular opacity is present anteriorly in the right upper lobe. Mosaic attenuation of both upper lobes may reflect mosaic perfusion of vascular origin given evidence of pulmonary hypertension. Upper Abdomen: There is prominence of the lateral segment of the left hepatic lobe which demonstrates a mildly nodular contour. There may also be a recanalized paraumbilical vein. Musculoskeletal: Moderate thoracic disc degeneration. No suspicious osseous lesion. IMPRESSION: 1. Dense right lower lobe consolidation with patchy consolidation elsewhere in both lungs compatible with pneumonia. 2. Small pleural effusions, right larger than left. 3. Findings suggesting pulmonary arterial hypertension. 4. Morphologic liver changes which may reflect underlying cirrhosis. 5.  Aortic Atherosclerosis (ICD10-I70.0). Electronically Signed   By: Logan Bores M.D.   On: 08/06/2016 09:46    ASSESSMENT / PLAN: Negative procalcitonin- doubtCAP Afib with RVR--now rate controlled.  hypokalemia Hx: OSA, HTN, Type II DM, and Anxiety  P:  Continue iv lasix  Continue abx  DC abx.  Echo results pending  Off Cardizem gtt; continue metoprolol.  Pharmacy consulted for coumadin dosing Prn xanax for anxiety  Replace K.   Marda Stalker, M.D. 08/07/2016

## 2016-08-08 LAB — CBC
HCT: 35.1 % (ref 35.0–47.0)
HEMOGLOBIN: 11.7 g/dL — AB (ref 12.0–16.0)
MCH: 28.1 pg (ref 26.0–34.0)
MCHC: 33.2 g/dL (ref 32.0–36.0)
MCV: 84.7 fL (ref 80.0–100.0)
PLATELETS: 187 10*3/uL (ref 150–440)
RBC: 4.15 MIL/uL (ref 3.80–5.20)
RDW: 15.4 % — ABNORMAL HIGH (ref 11.5–14.5)
WBC: 9 10*3/uL (ref 3.6–11.0)

## 2016-08-08 LAB — ECHOCARDIOGRAM COMPLETE
Height: 61 in
Weight: 3228.8 oz

## 2016-08-08 LAB — BASIC METABOLIC PANEL
Anion gap: 7 (ref 5–15)
BUN: 24 mg/dL — ABNORMAL HIGH (ref 6–20)
CHLORIDE: 106 mmol/L (ref 101–111)
CO2: 30 mmol/L (ref 22–32)
CREATININE: 0.61 mg/dL (ref 0.44–1.00)
Calcium: 8.5 mg/dL — ABNORMAL LOW (ref 8.9–10.3)
Glucose, Bld: 119 mg/dL — ABNORMAL HIGH (ref 65–99)
POTASSIUM: 3.6 mmol/L (ref 3.5–5.1)
SODIUM: 143 mmol/L (ref 135–145)

## 2016-08-08 LAB — PROTIME-INR
INR: 2.92
Prothrombin Time: 31.1 seconds — ABNORMAL HIGH (ref 11.4–15.2)

## 2016-08-08 LAB — GLUCOSE, CAPILLARY
GLUCOSE-CAPILLARY: 117 mg/dL — AB (ref 65–99)
GLUCOSE-CAPILLARY: 154 mg/dL — AB (ref 65–99)
GLUCOSE-CAPILLARY: 109 mg/dL — AB (ref 65–99)
GLUCOSE-CAPILLARY: 119 mg/dL — AB (ref 65–99)

## 2016-08-08 LAB — MAGNESIUM: MAGNESIUM: 2.2 mg/dL (ref 1.7–2.4)

## 2016-08-08 LAB — PROCALCITONIN

## 2016-08-08 LAB — TSH: TSH: 1.276 u[IU]/mL (ref 0.350–4.500)

## 2016-08-08 MED ORDER — WARFARIN - PHARMACIST DOSING INPATIENT
Freq: Every day | Status: DC
  Administered 2016-08-13: 18:00:00

## 2016-08-08 MED ORDER — FLECAINIDE ACETATE 50 MG PO TABS
50.0000 mg | ORAL_TABLET | Freq: Two times a day (BID) | ORAL | Status: DC
  Administered 2016-08-08 – 2016-08-14 (×13): 50 mg via ORAL
  Filled 2016-08-08 (×15): qty 1

## 2016-08-08 MED ORDER — WARFARIN SODIUM 1 MG PO TABS
2.0000 mg | ORAL_TABLET | Freq: Once | ORAL | Status: AC
  Administered 2016-08-08: 2 mg via ORAL
  Filled 2016-08-08: qty 2

## 2016-08-08 NOTE — Consult Note (Signed)
   [facility] CM Inpatient Consult   08/08/2016  Hannah Faulkner Jul 07, 1940 [id number]   Patient screened for [place] Management program services. Patient eligible, however transition of care will be conducted by Primary Care Provider and referral will be sent from MD office if needs persist. Spoke with patient and introduced [place] management services and left [facility] pamphlet. Needs identified this admit are  2 admits in the last 6 months. Notification sent to Provider office contact personnel  [facility].  Made inpatient RNCM aware of the above. Please contact for further questions:  Halton Neas RN, [facility] Liaison  [phone]) [place] [phone]) Toll free office

## 2016-08-08 NOTE — Care Management (Signed)
Transfer out of ICU late last pm.  Patient current need for oxygen is acute.  It is contraindicated to perform assessment for home 02 at present because patient heart rate increases dramatically with minimal exertion.  She does not have a walker at home. She is not able to participate with physical therapy at present due to her heart rate.

## 2016-08-08 NOTE — Progress Notes (Signed)
[facility] Encounter Note  Patient: Hannah Faulkner / Admit Date: 08/04/2016 / Date of Encounter: 08/08/2016, 5:08 PM   Subjective: Patient feeling much better today with less cough congestion shortness of breath. The patient has significant difficulty with ambulation causing severe shortness of breath weakness fatigue and palpitations Heart rate much better control with oral medication management combination at rest although still fairly rapid with ambulation and but still in atrial fibrillation. No evidence of heart failure or myocardial infarction  Review of Systems: Positive for: Shortness of breath cough congestion Negative for: Vision change, hearing change, syncope, dizziness, nausea, vomiting,diarrhea, bloody stool, stomach pain, positive for cough, congestion, negative for diaphoresis, urinary frequency, urinary pain,skin lesions, skin rashes Others previously listed  Objective: Telemetry: Atrial fibrillation with rapid ventricular rate Physical Exam: Blood pressure (!) 104/56, pulse (!) 113, temperature 98.4 F (36.9 C), temperature source Oral, resp. rate 20, height 5\' 1"  (1.549 m), weight 91.5 kg (201 lb 12.8 oz), SpO2 95 %. Body mass index is 38.13 kg/m. General: Well developed, well nourished, in no acute distress. Head: Normocephalic, atraumatic, sclera non-icteric, no xanthomas, nares are without discharge. Neck: No apparent masses Lungs: Normal respirations with diffuse wheezes, some rhonchi, no rales , no crackles   Heart: Irregular rate and rhythm, normal S1 S2, no murmur, no rub, no gallop, PMI is normal size and placement, carotid upstroke normal without bruit, jugular venous pressure normal Abdomen: Soft, non-tender, non-distended with normoactive bowel sounds. No hepatosplenomegaly. Abdominal aorta is normal size without bruit Extremities: Trace edema, no clubbing, no cyanosis, no ulcers,  Peripheral: 2+ radial, 2+ femoral, 2+ dorsal pedal  pulses Neuro: Alert and oriented. Moves all extremities spontaneously. Psych:  Responds to questions appropriately with a normal affect.   Intake/Output Summary (Last 24 hours) at 08/08/16 1708 Last data filed at 08/08/16 1049  Gross per 24 hour  Intake              240 ml  Output             1551 ml  Net            -1311 ml    Inpatient Medications:  . dextromethorphan  30 mg Oral BID   And  . guaiFENesin  600 mg Oral BID  . diltiazem  180 mg Oral BID  . flecainide  50 mg Oral Q12H  . furosemide  20 mg Intravenous BID  . gabapentin  100 mg Oral TID  . insulin aspart  0-15 Units Subcutaneous TID WC  . insulin aspart  0-5 Units Subcutaneous QHS  . metoprolol tartrate  50 mg Oral BID  . potassium chloride  40 mEq Oral Daily  . cyanocobalamin  1,000 mcg Oral Daily  . warfarin  2 mg Oral ONCE-1800  . Warfarin - Pharmacist Dosing Inpatient   Does not apply q1800   Infusions:  . diltiazem (CARDIZEM) infusion Stopped (08/06/16 2330)    Labs:  Recent Labs  08/07/16 0600 08/08/16 0457  NA 144 143  K 3.2* 3.6  CL 107 106  CO2 30 30  GLUCOSE 110* 119*  BUN 23* 24*  CREATININE 0.67 0.61  CALCIUM 8.2* 8.5*  MG 2.1 2.2   No results for input(s): AST, ALT, ALKPHOS, BILITOT, PROT, ALBUMIN in the last 72 hours.  Recent Labs  08/06/16 1131 08/07/16 0600 08/08/16 0457  WBC 10.5 8.4 9.0  NEUTROABS 7.9*  --   --   HGB 12.7 11.2* 11.7*  HCT 38.1 33.5*  35.1  MCV 84.3 85.1 84.7  PLT 222 182 187    Recent Labs  08/06/16 0349  TROPONINI <0.03   Invalid input(s): POCBNP No results for input(s): HGBA1C in the last 72 hours.   Weights: Filed Weights   08/04/16 1556 08/04/16 2228  Weight: 85.7 kg (189 lb) 91.5 kg (201 lb 12.8 oz)     Radiology/Studies:  Dg Chest 2 View  Result Date: 08/04/2016 CLINICAL DATA:  76 year old female with increased weakness and shortness of breath for 2 weeks. Uncontrolled atrial fibrillation. EXAM: CHEST  2 VIEW COMPARISON:  07/28/2016  and earlier. FINDINGS: Streaky and patchy bibasilar pulmonary opacity with mild progression since 07/28/2016. Stable cardiomegaly and mediastinal contours. No pneumothorax. Pulmonary vascularity appears stable. No definite pleural effusion. Negative visible bowel gas pattern. No acute osseous abnormality identified. Visualized tracheal air column is within normal limits. IMPRESSION: 1. Interval progression of Patchy and confluent bibasilar opacity. This is nonspecific but consider bilateral pneumonia or aspiration. 2. Stable cardiomegaly. No acute pulmonary edema, and no pleural effusion identified. Electronically Signed   By: Genevie Ann M.D.   On: 08/04/2016 17:18   Dg Chest 2 View  Result Date: 07/28/2016 CLINICAL DATA:  Pneumonia. EXAM: CHEST  2 VIEW COMPARISON:  05/28/2016 FINDINGS: The cardio pericardial silhouette is enlarged. There is pulmonary vascular congestion without overt pulmonary edema. Slight improvement in right base patchy opacity. Left base patchy airspace disease not substantially changed. No substantial pleural effusion. The visualized bony structures of the thorax are intact. IMPRESSION: Interval improvement in right basilar aeration with stable patchy airspace opacity at the left base. Electronically Signed   By: Misty Stanley M.D.   On: 07/28/2016 17:10   Ct Chest Wo Contrast  Result Date: 08/06/2016 CLINICAL DATA:  Pneumonia.  Cough and shortness of breath. EXAM: CT CHEST WITHOUT CONTRAST TECHNIQUE: Multidetector CT imaging of the chest was performed following the standard protocol without IV contrast. COMPARISON:  Chest radiographs 08/04/2016 FINDINGS: Cardiovascular: Normal caliber of the thoracic aorta with mild atherosclerotic calcification. Mild enlargement of the main pulmonary artery may reflect underlying pulmonary arterial hypertension. Mild coronary artery calcification. Mild cardiomegaly. No pericardial effusion. Mediastinum/Nodes: There is mild nodular enlargement of the left  thyroid lobe and isthmus, with an approximately 1.6 cm nodule extending from the isthmus and with a left lower pole nodule difficult to discretely measure. There is an increased number of small lymph nodes throughout the mediastinum measuring up to 1.1 cm in short axis, likely reactive. No axillary or definite hilar lymphadenopathy is identified on this unenhanced study. Unremarkable esophagus. Lungs/Pleura: There are small pleural effusions, right larger than left. Dense consolidation is present in the right lower lobe with air bronchograms. There are patchy areas of consolidation in the left lower lobe, right middle lobe, and lingula. Minimal nodular opacity is present anteriorly in the right upper lobe. Mosaic attenuation of both upper lobes may reflect mosaic perfusion of vascular origin given evidence of pulmonary hypertension. Upper Abdomen: There is prominence of the lateral segment of the left hepatic lobe which demonstrates a mildly nodular contour. There may also be a recanalized paraumbilical vein. Musculoskeletal: Moderate thoracic disc degeneration. No suspicious osseous lesion. IMPRESSION: 1. Dense right lower lobe consolidation with patchy consolidation elsewhere in both lungs compatible with pneumonia. 2. Small pleural effusions, right larger than left. 3. Findings suggesting pulmonary arterial hypertension. 4. Morphologic liver changes which may reflect underlying cirrhosis. 5.  Aortic Atherosclerosis (ICD10-I70.0). Electronically Signed   By: Seymour Bars.D.  On: 08/06/2016 09:46     Assessment and Recommendation  76 y.o. female with the acute pneumonia and hypoxia causing paroxysmal nonvalvular atrial fibrillation with rapid ventricular rate now improving with appropriate medication management without evidence of heart failure and/or myocardial infarction 1. Continue supportive care of bronchitis respiratory failure with inhalers and antibiotics 2. Continue oral medication management  including higher dose metoprolol and diltiazem for heart rate control between 60 and 90 bpm as able 3. Anticoagulation with heparin and other use of anticoagulation orally for further risk reduction of stroke with atrial fibrillation 4. Continue ambulation and follow for need in adjustments of medication management for heart rate control X 5. No further cardiac diagnostics necessary at this time 6. Possible reinstatement of flecainide for possible spontaneous conversion to normal sinus rhythm  Signed, Serafina Royals M.D. FACC

## 2016-08-08 NOTE — Progress Notes (Signed)
Hannah Faulkner NAME: Hannah Faulkner    MR#:  [id number]  DATE OF BIRTH:  [birth year]  SUBJECTIVE:  CHIEF COMPLAINT:   Chief Complaint  Patient presents with  . Weakness  . Shortness of Breath  The patient is 76 year old female with past medical history significant for history of A. fib, hypertension, anxiety, depression, diabetes, hyperlipidemia, recently treated for community acquired pneumonia with levofloxacin, who presents to the hospital with shortness of breath, decreased functional capacity, chills, dyspnea, nonproductive cough. On arrival to the hospital, she was noted to have worsening pneumonia, she was admitted to the hospital for further evaluation, treatment, initiated on broad-spectrum antibiotic therapy. The patient developed A. fib, RVR with heart rate to 160. Today, developing significant shortness of breath/dyspnea, requiring Lasix administration. It did not respond to intravenous metoprolol, and patient was transferred to intensive care unit for intravenous Cardizem drip. Flecainide was restarted. Patient feels good today, she was weaned off Cardizem drip. Heart rate remained stable in 90s, But now has intermittent heart rate elevations to 130-140, mostly on exertion, but also to rest. Patient denies any chest pains.CT of the chest revealed right lung consolidation, pulmonary hypertension, pleural effusions bilaterally.Lasix was initiated. Flecainide was restarted by cardiology today. Patient feels sluggish today. No diarrheal stool since 08/07/2016, when soft stool was documented at 7 AM  Review of Systems  Constitutional: Positive for malaise/fatigue. Negative for chills, fever and weight loss.  HENT: Negative for congestion.   Eyes: Negative for blurred vision and double vision.  Respiratory: Positive for shortness of breath. Negative for sputum production and wheezing.   Cardiovascular: Negative for chest pain,  palpitations, orthopnea, leg swelling and PND.  Gastrointestinal: Negative for abdominal pain, blood in stool, constipation, nausea and vomiting.  Genitourinary: Negative for dysuria, frequency, hematuria and urgency.  Musculoskeletal: Negative for falls.  Neurological: Positive for weakness. Negative for dizziness, tremors, focal weakness and headaches.  Endo/Heme/Allergies: Does not bruise/bleed easily.  Psychiatric/Behavioral: Negative for depression. The patient does not have insomnia.     VITAL SIGNS: Blood pressure (!) 104/56, pulse (!) 113, temperature 98.4 F (36.9 C), temperature source Oral, resp. rate 20, height 5\' 1"  (1.549 m), weight 91.5 kg (201 lb 12.8 oz), SpO2 95 %.  PHYSICAL EXAMINATION:   GENERAL:  76 y.o.-year-old patient lying in the bed in no distress.  EYES: Pupils equal, round, reactive to light and accommodation. No scleral icterus. Extraocular muscles intact.  HEENT: Head atraumatic, normocephalic. Oropharynx and nasopharynx clear.  NECK:  Supple, jugular venous distention. No thyroid enlargement, no tenderness.  LUNGS: Diminished breath sounds bilaterally, no wheezing,    Not using accessory muscles of respiration .  CARDIOVASCULAR: S1, S2 irregularly irregular, heart rate is better controlled. No murmurs, rubs, or gallops.  ABDOMEN: Soft, nontender, nondistended. Bowel sounds present. No organomegaly or mass.  EXTREMITIES: Trace lower extremity and pedal edema, no cyanosis, or clubbing.  NEUROLOGIC: Cranial nerves II through XII are intact. Muscle strength 5/5 in all extremities. Sensation intact. Gait not checked.  PSYCHIATRIC: The patient is alert and oriented x 3.  SKIN: No obvious rash, lesion, or ulcer.   ORDERS/RESULTS REVIEWED:   CBC  Recent Labs Lab 08/04/16 1602 08/06/16 1131 08/07/16 0600 08/08/16 0457  WBC 9.5 10.5 8.4 9.0  HGB 13.2 12.7 11.2* 11.7*  HCT 39.2 38.1 33.5* 35.1  PLT 273 222 182 187  MCV 84.8 84.3 85.1 84.7  MCH 28.5 28.0  28.5 28.1  MCHC 33.6  33.3 33.5 33.2  RDW 15.5* 15.6* 15.2* 15.4*  LYMPHSABS  --  1.6  --   --   MONOABS  --  0.9  --   --   EOSABS  --  0.1  --   --   BASOSABS  --  0.1  --   --    ------------------------------------------------------------------------------------------------------------------  Chemistries   Recent Labs Lab 08/04/16 1602 08/06/16 1131 08/07/16 0600 08/08/16 0457  NA 140 142 144 143  K 3.7 3.6 3.2* 3.6  CL 101 107 107 106  CO2 28 27 30 30   GLUCOSE 142* 138* 110* 119*  BUN 15 19 23* 24*  CREATININE 0.59 0.71 0.67 0.61  CALCIUM 9.0 8.8* 8.2* 8.5*  MG  --   --  2.1 2.2   ------------------------------------------------------------------------------------------------------------------ estimated creatinine clearance is 61.7 mL/min (by C-G formula based on SCr of 0.61 mg/dL). ------------------------------------------------------------------------------------------------------------------ No results for input(s): TSH, T4TOTAL, T3FREE, THYROIDAB in the last 72 hours.  Invalid input(s): FREET3  Cardiac Enzymes  Recent Labs Lab 08/06/16 0349  TROPONINI <0.03   ------------------------------------------------------------------------------------------------------------------ Invalid input(s): POCBNP ---------------------------------------------------------------------------------------------------------------  RADIOLOGY: No results found.  EKG:  Orders placed or performed during the hospital encounter of 08/04/16  . EKG 12-Lead  . EKG 12-Lead  . ED EKG within 10 minutes  . ED EKG within 10 minutes  . EKG 12-Lead  . EKG 12-Lead    ASSESSMENT AND PLAN:  Active Problems:   Community acquired pneumonia    #1. Acute respiratory failure with hypoxia, patient was not on oxygen therapy at home, now on 2 L, continue oxygen, wean off as tolerated, patient  is to be continued on intravenous Lasix. Patient was seen by pulmonologist, who did not feel that she  had pneumonia,  now off antibiotic therapy,  stable clinically, no fevers, normal white blood cell count. Continue incentive spirometer, Lasix, wean off oxygen as tolerated  #2, A. fib, RVR, now off Cardizem IV drip, continue CardizemCD, Metoprolol, Flecainide, discussed with Dr. Nehemiah Massed today, appreciate cardiologist  recommendations, troponin was negative. Coumadin dosing as per pharmacy. Echocardiogram revealed normal ejection fraction, mild aortic stenosis, mild mitral regurgitation , continue Lasix, following weight, oxygenation #3. Essential hypertension, patient's blood pressure is intermittently low, now off hydralazine, continue Cardizem and metoprolol, relatively stable #4 diarrheal stool, negative for C. difficile, gastrointestinal panel is negative, diarrheal stool subsided #5. Hypokalemia, supplemented orally, improved, magnesium level was normal   Management plans discussed with the patient, family and they are in agreement.   DRUG ALLERGIES:  Allergies  Allergen Reactions  . Ampicillin   . Penicillins   . Prinzide  [Lisinopril-Hydrochlorothiazide]     CODE STATUS:     Code Status Orders        Start     Ordered   08/04/16 2222  Full code  Continuous     08/04/16 2221    Code Status History    Date Active Date Inactive Code Status Order ID Comments User Context   02/29/2016  8:53 PM 03/03/2016  2:03 PM Full Code [id number]  Henreitta Leber, MD Inpatient      TOTAL TIME TAKING CARE OF THIS PATIENT: 27mminutes.  Discussed this patient's son, Mr. Zoraya Fiorenza, all questions were answered, he voiced understanding   Lulla Linville M.D on 08/08/2016 at 3:33 PM  Between 7am to 6pm - Pager - [phone]  After 6pm go to [email or web address] - password [email or web address] [facility] [place] [facility]  Office  [phone]  CC: Primary care physician; Miguel Aschoff  Kaylyn Lim., MD

## 2016-08-08 NOTE — Progress Notes (Signed)
ANTICOAGULATION CONSULT NOTE - Initial Consult  Pharmacy Consult for warfarin dosing Indication: atrial fibrillation  Allergies  Allergen Reactions  . Ampicillin   . Penicillins   . Prinzide  [Lisinopril-Hydrochlorothiazide]     Patient Measurements: Height: 5\' 1"  (154.9 cm) Weight: 201 lb 12.8 oz (91.5 kg) IBW/kg (Calculated) : 47.8  Vital Signs: Temp: 98.4 F (36.9 C) (05/18 1141) Temp Source: Oral (05/18 1141) BP: 104/56 (05/18 1141) Pulse Rate: 113 (05/18 1141)  Labs:  Recent Labs  08/06/16 0349  08/06/16 1131 08/07/16 0600 08/08/16 0457  HGB  --   < > 12.7 11.2* 11.7*  HCT  --   --  38.1 33.5* 35.1  PLT  --   --  222 182 187  LABPROT 36.2*  --   --  39.0* 31.1*  INR 3.53  --   --  3.87 2.92  CREATININE  --   --  0.71 0.67 0.61  TROPONINI <0.03  --   --   --   --   < > = values in this interval not displayed.  Estimated Creatinine Clearance: 61.7 mL/min (by C-G formula based on SCr of 0.61 mg/dL).   Medical History: Past Medical History:  Diagnosis Date  . A-fib ([place])   . Hypertension     Medications:  Pharmacy consulted to dose and monitor warfarin in this 76 year old female who was taking warfarin prior to admission for a history of atrial fibrillation.   INR of 2.23 was therapeutic on admission Last dose PTA was on 5/13  Home regimen:   Warfarin 2 mg and 2.5 mg taken on alternating days  Dosing history: Date INR Dose 5/14 2.23 2.5 mg dose given early AM on 5/15 during admission 5/15 2.18 2 mg 5/16 3.53 HELD 5/17 3.87 HELD 5/18 2.92   Goal of Therapy:  INR 2-3  Plan:  Will give warfarin 2 mg PO this evening and recheck INR with AM Labs tomorrow.  Lenis Noon, PharmD, BCPS Clinical Pharmacist 08/08/2016,12:22 PM

## 2016-08-08 NOTE — Care Management Important Message (Signed)
Important Message  Patient Details  Name: Hannah Faulkner MRN: [id number] Date of Birth: 18-Jun-1940   Medicare Important Message Given:  Yes Signed IM notice given    Katrina Stack, RN 08/08/2016, 2:27 PM

## 2016-08-09 LAB — CULTURE, BLOOD (ROUTINE X 2)
CULTURE: NO GROWTH
CULTURE: NO GROWTH
SPECIAL REQUESTS: ADEQUATE
Special Requests: ADEQUATE

## 2016-08-09 LAB — GLUCOSE, CAPILLARY
GLUCOSE-CAPILLARY: 149 mg/dL — AB (ref 65–99)
GLUCOSE-CAPILLARY: 98 mg/dL (ref 65–99)
Glucose-Capillary: 117 mg/dL — ABNORMAL HIGH (ref 65–99)
Glucose-Capillary: 104 mg/dL — ABNORMAL HIGH (ref 65–99)

## 2016-08-09 LAB — PROTIME-INR
INR: 2.56
Prothrombin Time: 28 seconds — ABNORMAL HIGH (ref 11.4–15.2)

## 2016-08-09 MED ORDER — POTASSIUM CHLORIDE CRYS ER 20 MEQ PO TBCR
20.0000 meq | EXTENDED_RELEASE_TABLET | Freq: Every day | ORAL | Status: DC
  Administered 2016-08-09 – 2016-08-12 (×4): 20 meq via ORAL
  Filled 2016-08-09 (×4): qty 1

## 2016-08-09 MED ORDER — WARFARIN SODIUM 1 MG PO TABS
2.0000 mg | ORAL_TABLET | Freq: Every day | ORAL | Status: DC
  Administered 2016-08-09 – 2016-08-10 (×2): 2 mg via ORAL
  Filled 2016-08-09 (×2): qty 2

## 2016-08-09 NOTE — Progress Notes (Signed)
ANTICOAGULATION CONSULT NOTE - Initial Consult  Pharmacy Consult for warfarin dosing Indication: atrial fibrillation  Allergies  Allergen Reactions  . Ampicillin   . Penicillins   . Prinzide  [Lisinopril-Hydrochlorothiazide]     Patient Measurements: Height: 5\' 1"  (154.9 cm) Weight: 201 lb 12.8 oz (91.5 kg) IBW/kg (Calculated) : 47.8  Vital Signs: Temp: 97.6 F (36.4 C) (05/19 0743) Temp Source: Oral (05/19 0743) BP: 134/81 (05/19 0743) Pulse Rate: 141 (05/19 0916)  Labs:  Recent Labs  08/07/16 0600 08/08/16 0457 08/09/16 0526  HGB 11.2* 11.7*  --   HCT 33.5* 35.1  --   PLT 182 187  --   LABPROT 39.0* 31.1* 28.0*  INR 3.87 2.92 2.56  CREATININE 0.67 0.61  --     Estimated Creatinine Clearance: 61.7 mL/min (by C-G formula based on SCr of 0.61 mg/dL).   Medical History: Past Medical History:  Diagnosis Date  . A-fib ([place])   . Hypertension     Medications:  Pharmacy consulted to dose and monitor warfarin in this 76 year old female who was taking warfarin prior to admission for a history of atrial fibrillation.   INR of 2.23 was therapeutic on admission Last dose PTA was on 5/13  Home regimen:   Warfarin 2 mg and 2.5 mg taken on alternating days  Dosing history: Date INR Dose 5/14 2.23 2.5 mg dose given early AM on 5/15 during admission 5/15 2.18 2 mg 5/16 3.53 HELD 5/17 3.87 HELD 5/18 2.92 2 mg 5/19 2.56    Goal of Therapy:  INR 2-3  Plan:  Will give warfarin 2 mg PO this evening and recheck INR with AM Labs tomorrow.  Napoleon Form, PharmD, BCPS Clinical Pharmacist 08/09/2016,11:31 AM

## 2016-08-09 NOTE — Progress Notes (Signed)
Patient declined cpap for the night. She states her anxiety is high. She cannot lay in bed and cpap therapy dries her out. Patient on auto cpap which is not able to provide humidification. Offered non auto cpap with protocol pressure setting of 10 so that could add moisture. She still declined at this time. No distress noted otherwise

## 2016-08-09 NOTE — Progress Notes (Signed)
[facility] at [place] NAME: Hannah Faulkner    MR#:  [id number]  DATE OF BIRTH:  [birth year]  SUBJECTIVE:  CHIEF COMPLAINT:   Chief Complaint  Patient presents with  . Weakness  . Shortness of Breath  The patient is 76 year old female with past medical history significant for history of A. fib, hypertension, anxiety, depression, diabetes, hyperlipidemia, recently treated for community acquired pneumonia with levofloxacin, who presents to the hospital with shortness of breath, decreased functional capacity, chills, dyspnea, nonproductive cough. On arrival to the hospital, she was noted to have worsening pneumonia, she was admitted to the hospital for further evaluation, treatment, initiated on broad-spectrum antibiotic therapy. The patient developed A. fib, RVR with heart rate to 160. Today, developing significant shortness of breath/dyspnea, requiring Lasix administration. It did not respond to intravenous metoprolol, and patient was transferred to intensive care unit for intravenous Cardizem drip. Flecainide was restarted.   She  still feels short of breath with minimal exertion he feels shortness of breath with minimal exertion, heart rate up to 110 with exertion even going to the bathroom. And she says it takes some time before she feels better.  Review of Systems  Constitutional: Positive for malaise/fatigue. Negative for chills, fever and weight loss.  HENT: Negative for congestion.   Eyes: Negative for blurred vision and double vision.  Respiratory: Positive for shortness of breath. Negative for sputum production and wheezing.   Cardiovascular: Negative for chest pain, palpitations, orthopnea, leg swelling and PND.  Gastrointestinal: Negative for abdominal pain, blood in stool, constipation, nausea and vomiting.  Genitourinary: Negative for dysuria, frequency, hematuria and urgency.  Musculoskeletal: Negative for falls.  Neurological:  Positive for weakness. Negative for dizziness, tremors, focal weakness and headaches.  Endo/Heme/Allergies: Does not bruise/bleed easily.  Psychiatric/Behavioral: Negative for depression. The patient does not have insomnia.     VITAL SIGNS: Blood pressure 134/81, pulse (!) 101, temperature 97.6 F (36.4 C), temperature source Oral, resp. rate 20, height 5\' 1"  (1.549 m), weight 91.5 kg (201 lb 12.8 oz), SpO2 93 %.  PHYSICAL EXAMINATION:   GENERAL:  75 y.o.-year-old patient lying in the bed in no distress.  EYES: Pupils equal, round, reactive to light and accommodation. No scleral icterus. Extraocular muscles intact.  HEENT: Head atraumatic, normocephalic. Oropharynx and nasopharynx clear.  NECK:  Supple, jugular venous distention. No thyroid enlargement, no tenderness.  LUNGS: Diminished breath sounds bilaterally, no wheezing,    Not using accessory muscles of respiration .  CARDIOVASCULAR: S1, S2 irregularly irregular, heart rate is better controlled. No murmurs, rubs, or gallops.  ABDOMEN: Soft, nontender, nondistended. Bowel sounds present. No organomegaly or mass.  EXTREMITIES: Trace lower extremity and pedal edema, no cyanosis, or clubbing.  NEUROLOGIC: Cranial nerves II through XII are intact. Muscle strength 5/5 in all extremities. Sensation intact. Gait not checked.  PSYCHIATRIC: The patient is alert and oriented x 3.  SKIN: No obvious rash, lesion, or ulcer.   ORDERS/RESULTS REVIEWED:   CBC  Recent Labs Lab 08/04/16 1602 08/06/16 1131 08/07/16 0600 08/08/16 0457  WBC 9.5 10.5 8.4 9.0  HGB 13.2 12.7 11.2* 11.7*  HCT 39.2 38.1 33.5* 35.1  PLT 273 222 182 187  MCV 84.8 84.3 85.1 84.7  MCH 28.5 28.0 28.5 28.1  MCHC 33.6 33.3 33.5 33.2  RDW 15.5* 15.6* 15.2* 15.4*  LYMPHSABS  --  1.6  --   --   MONOABS  --  0.9  --   --  EOSABS  --  0.1  --   --   BASOSABS  --  0.1  --   --     ------------------------------------------------------------------------------------------------------------------  Chemistries   Recent Labs Lab 08/04/16 1602 08/06/16 1131 08/07/16 0600 08/08/16 0457  NA 140 142 144 143  K 3.7 3.6 3.2* 3.6  CL 101 107 107 106  CO2 28 27 30 30   GLUCOSE 142* 138* 110* 119*  BUN 15 19 23* 24*  CREATININE 0.59 0.71 0.67 0.61  CALCIUM 9.0 8.8* 8.2* 8.5*  MG  --   --  2.1 2.2   ------------------------------------------------------------------------------------------------------------------ estimated creatinine clearance is 61.7 mL/min (by C-G formula based on SCr of 0.61 mg/dL). ------------------------------------------------------------------------------------------------------------------  Recent Labs  08/08/16 0457  TSH 1.276    Cardiac Enzymes  Recent Labs Lab 08/06/16 0349  TROPONINI <0.03   ------------------------------------------------------------------------------------------------------------------ Invalid input(s): POCBNP ---------------------------------------------------------------------------------------------------------------  RADIOLOGY: No results found.  EKG:  Orders placed or performed during the hospital encounter of 08/04/16  . EKG 12-Lead  . EKG 12-Lead  . ED EKG within 10 minutes  . ED EKG within 10 minutes  . EKG 12-Lead  . EKG 12-Lead  . EKG 12-Lead  . EKG 12-Lead    ASSESSMENT AND PLAN:  Active Problems:   Community acquired pneumonia    #1. Acute respiratory failure with hypoxia, patient was not on oxygen therapy at home, now on 2 L, continue oxygen, wean off as tolerated, patient  is to be continued on intravenous Lasix. Patient was seen by pulmonologist, who did not feel that she had pneumonia,  now off antibiotic therapy,  stable clinically, no fevers, normal white blood cell count. Continue incentive spirometer, Lasix, wean off oxygen as tolerated   #2, A. fib, RVR, now off Cardizem IV  drip, continue CardizemCD, Metoprolol, Flecainide, discussed with Dr. Nehemiah Massed  appreciate cardiologist  recommendations, troponin was negative. Coumadin dosing as per pharmacy. Echocardiogram revealed normal ejection fraction, mild aortic stenosis, mild mitral regurgitation , continue Lasix, following weight, oxygenation  #3. Essential hypertension, patient's blood pressure is intermittently low, Did not receive metoprolol last night secondary to hypotension. Now blood pressure is better, now off hydralazine, continue Cardizem and metoprolol, relatively stable cardiology is following to see if patient needs electrical cardioversion if heart rate is still uncontrolled with exertion. #4 diarrheal stool, negative for C. difficile, gastrointestinal panel is negative, diarrheal stool subsided #5. Hypokalemia, supplemented orally, improved, magnesium level was normal   Management plans discussed with the patient, family and they are in agreement.   DRUG ALLERGIES:  Allergies  Allergen Reactions  . Ampicillin   . Penicillins   . Prinzide  [Lisinopril-Hydrochlorothiazide]     CODE STATUS:     Code Status Orders        Start     Ordered   08/04/16 2222  Full code  Continuous     08/04/16 2221    Code Status History    Date Active Date Inactive Code Status Order ID Comments User Context   02/29/2016  8:53 PM 03/03/2016  2:03 PM Full Code [id number]  Henreitta Leber, MD Inpatient      TOTAL TIME TAKING CARE OF THIS PATIENT: 64mminutes.  Discussed this patient's son, Mr. Millie Forde, all questions were answered, he voiced understanding   Everette Mall M.D on 08/09/2016 at 8:07 AM  Between 7am to 6pm - Pager - [phone]  After 6pm go to [email or web address] - password [email or web address] [facility]  [facility]  Office  [phone]  CC: Primary care  physician; Jerrol Banana., MD

## 2016-08-09 NOTE — Progress Notes (Signed)
[facility] Encounter Note  Patient: Hannah Faulkner / Admit Date: 08/04/2016 / Date of Encounter: 08/09/2016, 6:48 AM   Subjective:  Patient has been feeling relatively better since hospital admission although is significantly short of breath weak and fatigued with any physical activity multifactorial in nature including the majority of hypoxia and inflammatory lung disease at this time. This is exacerbating her atrial fibrillation with rapid ventricular rate despite high dosages of medication management for heart rate control for which currently is well-controlled at rest.  Review of Systems: Positive for: Shortness of breath cough congestion Negative for: Vision change, hearing change, syncope, dizziness, nausea, vomiting,diarrhea, bloody stool, stomach pain, positive for cough, congestion, negative for diaphoresis, urinary frequency, urinary pain,skin lesions, skin rashes Others previously listed  Objective: Telemetry: Atrial fibrillation with rapid ventricular rate Physical Exam: Blood pressure (!) 124/59, pulse 94, temperature 98.3 F (36.8 C), resp. rate 18, height 5\' 1"  (1.549 m), weight 91.5 kg (201 lb 12.8 oz), SpO2 94 %. Body mass index is 38.13 kg/m. General: Well developed, well nourished, in no acute distress. Head: Normocephalic, atraumatic, sclera non-icteric, no xanthomas, nares are without discharge. Neck: No apparent masses Lungs: Normal respirations with diffuse wheezes, some rhonchi, no rales , no crackles   Heart: Irregular rate and rhythm, normal S1 S2, no murmur, no rub, no gallop, PMI is normal size and placement, carotid upstroke normal without bruit, jugular venous pressure normal Abdomen: Soft, non-tender, non-distended with normoactive bowel sounds. No hepatosplenomegaly. Abdominal aorta is normal size without bruit Extremities: Trace edema, no clubbing, no cyanosis, no ulcers,  Peripheral: 2+ radial, 2+ femoral, 2+ dorsal pedal  pulses Neuro: Alert and oriented. Moves all extremities spontaneously. Psych:  Responds to questions appropriately with a normal affect.   Intake/Output Summary (Last 24 hours) at 08/09/16 0648 Last data filed at 08/09/16 0457  Gross per 24 hour  Intake              480 ml  Output              701 ml  Net             -221 ml    Inpatient Medications:  . dextromethorphan  30 mg Oral BID   And  . guaiFENesin  600 mg Oral BID  . diltiazem  180 mg Oral BID  . flecainide  50 mg Oral Q12H  . furosemide  20 mg Intravenous BID  . gabapentin  100 mg Oral TID  . insulin aspart  0-15 Units Subcutaneous TID WC  . insulin aspart  0-5 Units Subcutaneous QHS  . metoprolol tartrate  50 mg Oral BID  . potassium chloride  40 mEq Oral Daily  . cyanocobalamin  1,000 mcg Oral Daily  . Warfarin - Pharmacist Dosing Inpatient   Does not apply q1800   Infusions:  . diltiazem (CARDIZEM) infusion Stopped (08/06/16 2330)    Labs:  Recent Labs  08/07/16 0600 08/08/16 0457  NA 144 143  K 3.2* 3.6  CL 107 106  CO2 30 30  GLUCOSE 110* 119*  BUN 23* 24*  CREATININE 0.67 0.61  CALCIUM 8.2* 8.5*  MG 2.1 2.2   No results for input(s): AST, ALT, ALKPHOS, BILITOT, PROT, ALBUMIN in the last 72 hours.  Recent Labs  08/06/16 1131 08/07/16 0600 08/08/16 0457  WBC 10.5 8.4 9.0  NEUTROABS 7.9*  --   --   HGB 12.7 11.2* 11.7*  HCT 38.1 33.5* 35.1  MCV 84.3 85.1  84.7  PLT 222 182 187   No results for input(s): CKTOTAL, CKMB, TROPONINI in the last 72 hours. Invalid input(s): POCBNP No results for input(s): HGBA1C in the last 72 hours.   Weights: Filed Weights   08/04/16 1556 08/04/16 2228  Weight: 85.7 kg (189 lb) 91.5 kg (201 lb 12.8 oz)     Radiology/Studies:  Dg Chest 2 View  Result Date: 08/04/2016 CLINICAL DATA:  76 year old female with increased weakness and shortness of breath for 2 weeks. Uncontrolled atrial fibrillation. EXAM: CHEST  2 VIEW COMPARISON:  07/28/2016 and earlier.  FINDINGS: Streaky and patchy bibasilar pulmonary opacity with mild progression since 07/28/2016. Stable cardiomegaly and mediastinal contours. No pneumothorax. Pulmonary vascularity appears stable. No definite pleural effusion. Negative visible bowel gas pattern. No acute osseous abnormality identified. Visualized tracheal air column is within normal limits. IMPRESSION: 1. Interval progression of Patchy and confluent bibasilar opacity. This is nonspecific but consider bilateral pneumonia or aspiration. 2. Stable cardiomegaly. No acute pulmonary edema, and no pleural effusion identified. Electronically Signed   By: Genevie Ann M.D.   On: 08/04/2016 17:18   Dg Chest 2 View  Result Date: 07/28/2016 CLINICAL DATA:  Pneumonia. EXAM: CHEST  2 VIEW COMPARISON:  05/28/2016 FINDINGS: The cardio pericardial silhouette is enlarged. There is pulmonary vascular congestion without overt pulmonary edema. Slight improvement in right base patchy opacity. Left base patchy airspace disease not substantially changed. No substantial pleural effusion. The visualized bony structures of the thorax are intact. IMPRESSION: Interval improvement in right basilar aeration with stable patchy airspace opacity at the left base. Electronically Signed   By: Misty Stanley M.D.   On: 07/28/2016 17:10   Ct Chest Wo Contrast  Result Date: 08/06/2016 CLINICAL DATA:  Pneumonia.  Cough and shortness of breath. EXAM: CT CHEST WITHOUT CONTRAST TECHNIQUE: Multidetector CT imaging of the chest was performed following the standard protocol without IV contrast. COMPARISON:  Chest radiographs 08/04/2016 FINDINGS: Cardiovascular: Normal caliber of the thoracic aorta with mild atherosclerotic calcification. Mild enlargement of the main pulmonary artery may reflect underlying pulmonary arterial hypertension. Mild coronary artery calcification. Mild cardiomegaly. No pericardial effusion. Mediastinum/Nodes: There is mild nodular enlargement of the left thyroid lobe  and isthmus, with an approximately 1.6 cm nodule extending from the isthmus and with a left lower pole nodule difficult to discretely measure. There is an increased number of small lymph nodes throughout the mediastinum measuring up to 1.1 cm in short axis, likely reactive. No axillary or definite hilar lymphadenopathy is identified on this unenhanced study. Unremarkable esophagus. Lungs/Pleura: There are small pleural effusions, right larger than left. Dense consolidation is present in the right lower lobe with air bronchograms. There are patchy areas of consolidation in the left lower lobe, right middle lobe, and lingula. Minimal nodular opacity is present anteriorly in the right upper lobe. Mosaic attenuation of both upper lobes may reflect mosaic perfusion of vascular origin given evidence of pulmonary hypertension. Upper Abdomen: There is prominence of the lateral segment of the left hepatic lobe which demonstrates a mildly nodular contour. There may also be a recanalized paraumbilical vein. Musculoskeletal: Moderate thoracic disc degeneration. No suspicious osseous lesion. IMPRESSION: 1. Dense right lower lobe consolidation with patchy consolidation elsewhere in both lungs compatible with pneumonia. 2. Small pleural effusions, right larger than left. 3. Findings suggesting pulmonary arterial hypertension. 4. Morphologic liver changes which may reflect underlying cirrhosis. 5.  Aortic Atherosclerosis (ICD10-I70.0). Electronically Signed   By: Logan Bores M.D.   On: 08/06/2016 09:46  Assessment and Recommendation  75 y.o. female with the acute pneumonia and hypoxia causing paroxysmal nonvalvular atrial fibrillation with rapid ventricular rate now improving with appropriate medication management without evidence of heart failure and/or myocardial infarction 1. Continue supportive care of bronchitis respiratory failure with inhalers andPrednisone 2. Continue oral medication management including higher  dose metoprolol and diltiazem for heart rate control between 60 and 90 bpm as able and potentially increasing dose even further if necessary for heart rate control although majority of the the issues is coming from hypoxia weakness and fatigue 3. Anticoagulation with heparin and other use of anticoagulation orally for further risk reduction of stroke with atrial fibrillation 4. Continue ambulation and follow for need in adjustments of medication management for heart rate control X 5. No further cardiac diagnostics necessary at this time 6. Possible reinstatement of flecainide for possible spontaneous conversion to normal sinus rhythm. Would also consider the possibility of electrical cardioversion to normal sinus rhythm is patient continues to have difficulty with heart rate control possibly contributing to her severe shortness of breath  Signed, Serafina Royals M.D. FACC

## 2016-08-10 LAB — GLUCOSE, CAPILLARY
GLUCOSE-CAPILLARY: 118 mg/dL — AB (ref 65–99)
GLUCOSE-CAPILLARY: 123 mg/dL — AB (ref 65–99)
Glucose-Capillary: 121 mg/dL — ABNORMAL HIGH (ref 65–99)
Glucose-Capillary: 96 mg/dL (ref 65–99)

## 2016-08-10 LAB — PROTIME-INR
INR: 2.57
Prothrombin Time: 28.1 seconds — ABNORMAL HIGH (ref 11.4–15.2)

## 2016-08-10 NOTE — Progress Notes (Signed)
Notified by CCMD of 2.45 second pause. Pt is sleeping, in no acute distress at this time. Will continue to monitor and assess.

## 2016-08-10 NOTE — Progress Notes (Signed)
Notified MD of 2.45 second pause. No orders at this time. Will continue to monitor and assess.

## 2016-08-10 NOTE — Progress Notes (Signed)
[facility] [place] NAME: Hannah Faulkner    MR#:  [id number]  DATE OF BIRTH:  1940-08-23  SUBJECTIVE:  Pt  feels better today. Less shortness of breath.  CHIEF COMPLAINT:   Chief Complaint  Patient presents with  . Weakness  . Shortness of Breath  The patient is 76 year old female with past medical history significant for history of A. fib, hypertension, anxiety, depression, diabetes, hyperlipidemia, recently treated for community acquired pneumonia with levofloxacin, who presents to the hospital with shortness of breath, decreased functional capacity, chills, dyspnea, nonproductive cough. On arrival to the hospital, she was noted to have worsening pneumonia, she was admitted to the hospital for further evaluation, treatment, initiated on broad-spectrum antibiotic therapy. The patient developed A. fib, RVR with heart rate to 160. Today, developing significant shortness of breath/dyspnea, requiring Lasix administration. It did not respond to intravenous metoprolol, and patient was transferred to intensive care unit for intravenous Cardizem drip. Flecainide was restarted.    Review of Systems  Constitutional: Positive for malaise/fatigue. Negative for chills, fever and weight loss.  HENT: Negative for congestion.   Eyes: Negative for blurred vision and double vision.  Respiratory: Positive for shortness of breath. Negative for sputum production and wheezing.   Cardiovascular: Negative for chest pain, palpitations, orthopnea, leg swelling and PND.  Gastrointestinal: Negative for abdominal pain, blood in stool, constipation, nausea and vomiting.  Genitourinary: Negative for dysuria, frequency, hematuria and urgency.  Musculoskeletal: Negative for falls.  Neurological: Positive for weakness. Negative for dizziness, tremors, focal weakness and headaches.  Endo/Heme/Allergies: Does not bruise/bleed easily.  Psychiatric/Behavioral: Negative for  depression. The patient does not have insomnia.     VITAL SIGNS: Blood pressure 126/75, pulse 89, temperature 97.9 F (36.6 C), temperature source Oral, resp. rate 16, height 5\' 1"  (1.549 m), weight 91.5 kg (201 lb 12.8 oz), SpO2 98 %.  PHYSICAL EXAMINATION:   GENERAL:  76 y.o.-year-old patient lying in the bed in no distress.  EYES: Pupils equal, round, reactive to light and accommodation. No scleral icterus. Extraocular muscles intact.  HEENT: Head atraumatic, normocephalic. Oropharynx and nasopharynx clear.  NECK:  Supple, jugular venous distention. No thyroid enlargement, no tenderness.  LUNGS: Diminished breath sounds bilaterally, no wheezing,    Not using accessory muscles of respiration .  CARDIOVASCULAR: S1, S2 irregularly irregular, heart rate is better controlled. No murmurs, rubs, or gallops.  ABDOMEN: Soft, nontender, nondistended. Bowel sounds present. No organomegaly or mass.  EXTREMITIES: Trace lower extremity and pedal edema, no cyanosis, or clubbing.  NEUROLOGIC: Cranial nerves II through XII are intact. Muscle strength 5/5 in all extremities. Sensation intact. Gait not checked.  PSYCHIATRIC: The patient is alert and oriented x 3.  SKIN: No obvious rash, lesion, or ulcer.   ORDERS/RESULTS REVIEWED:   CBC  Recent Labs Lab 08/04/16 1602 08/06/16 1131 08/07/16 0600 08/08/16 0457  WBC 9.5 10.5 8.4 9.0  HGB 13.2 12.7 11.2* 11.7*  HCT 39.2 38.1 33.5* 35.1  PLT 273 222 182 187  MCV 84.8 84.3 85.1 84.7  MCH 28.5 28.0 28.5 28.1  MCHC 33.6 33.3 33.5 33.2  RDW 15.5* 15.6* 15.2* 15.4*  LYMPHSABS  --  1.6  --   --   MONOABS  --  0.9  --   --   EOSABS  --  0.1  --   --   BASOSABS  --  0.1  --   --    ------------------------------------------------------------------------------------------------------------------  Chemistries   Recent  Labs Lab 08/04/16 1602 08/06/16 1131 08/07/16 0600 08/08/16 0457  NA 140 142 144 143  K 3.7 3.6 3.2* 3.6  CL 101 107 107 106    CO2 28 27 30 30   GLUCOSE 142* 138* 110* 119*  BUN 15 19 23* 24*  CREATININE 0.59 0.71 0.67 0.61  CALCIUM 9.0 8.8* 8.2* 8.5*  MG  --   --  2.1 2.2   ------------------------------------------------------------------------------------------------------------------ estimated creatinine clearance is 61.7 mL/min (by C-G formula based on SCr of 0.61 mg/dL). ------------------------------------------------------------------------------------------------------------------  Recent Labs  08/08/16 0457  TSH 1.276    Cardiac Enzymes  Recent Labs Lab 08/06/16 0349  TROPONINI <0.03   ------------------------------------------------------------------------------------------------------------------ Invalid input(s): POCBNP ---------------------------------------------------------------------------------------------------------------  RADIOLOGY: No results found.  EKG:  Orders placed or performed during the hospital encounter of 08/04/16  . EKG 12-Lead  . EKG 12-Lead  . ED EKG within 10 minutes  . ED EKG within 10 minutes  . EKG 12-Lead  . EKG 12-Lead  . EKG 12-Lead  . EKG 12-Lead    ASSESSMENT AND PLAN:  Active Problems:   Community acquired pneumonia    #1. Acute respiratory failure with hypoxia, patient was not on oxygen therapy at home, now on 2 L, continue oxygen, wean off as tolerated, patient  is to be continued on intravenous Lasix. Patient was seen by pulmonologist, who did not feel that she had pneumonia,  now off antibiotic therapy,  stable clinically, no fevers, normal white blood cell count. Continue incentive spirometer, Lasix, wean off oxygen as tolerated   #2, A. fib, RVR, now off Cardizem IV drip, continue CardizemCD, Metoprolol, Flecainide,  discussed with Dr. Nehemiah Massed  appreciate cardiologist  recommendations, troponin was negative. Coumadin dosing as per pharmacy. Echocardiogram revealed normal ejection fraction, mild aortic stenosis, mild mitral regurgitation  , continue Lasix, following weight, oxygenation.  #3. Essential hypertension, patient's blood pressure is intermittently low,. Now blood pressure is better, now off hydralazine, continue Cardizem and metoprolol, relatively stable cardiology is following to see if patient needs electrical cardioversion if heart rate is still uncontrolled with exertion.   #4 diarrheal stool, negative for C. difficile, gastrointestinal panel is negative, diarrheal stool subsided  #5. Hypokalemia, supplemented orally, improved, magnesium level was normal   Management plans discussed with the patient, family and they are in agreement. D/w RN Manuela Schwartz.  DRUG ALLERGIES:  Allergies  Allergen Reactions  . Ampicillin   . Penicillins   . Prinzide  [Lisinopril-Hydrochlorothiazide]     CODE STATUS:     Code Status Orders        Start     Ordered   08/04/16 2222  Full code  Continuous     08/04/16 2221    Code Status History    Date Active Date Inactive Code Status Order ID Comments User Context   02/29/2016  8:53 PM 03/03/2016  2:03 PM Full Code [id number]  Henreitta Leber, MD Inpatient      TOTAL TIME TAKING CARE OF THIS PATIENT: 11mminutes.  Discussed this patient's son, Mr. Hannah Faulkner, all questions were answered, he voiced understanding   Hannah Faulkner M.D on 08/10/2016 at 9:04 AM  Between 7am to 6pm - Pager - [phone]  After 6pm go to [email or web address] - password [email or web address] [facility]  [place] [facility]  Office  [phone]  CC: Primary care physician; Hannah Faulkner., MD

## 2016-08-10 NOTE — Progress Notes (Signed)
ANTICOAGULATION CONSULT NOTE - Initial Consult  Pharmacy Consult for warfarin dosing Indication: atrial fibrillation  Allergies  Allergen Reactions  . Ampicillin   . Penicillins   . Prinzide  [Lisinopril-Hydrochlorothiazide]     Patient Measurements: Height: 5\' 1"  (154.9 cm) Weight: 201 lb 12.8 oz (91.5 kg) IBW/kg (Calculated) : 47.8  Vital Signs: Temp: 98 F (36.7 C) (05/20 0800) Temp Source: Oral (05/20 0800) BP: 125/76 (05/20 0800) Pulse Rate: 108 (05/20 0800)  Labs:  Recent Labs  08/08/16 0457 08/09/16 0526 08/10/16 0438  HGB 11.7*  --   --   HCT 35.1  --   --   PLT 187  --   --   LABPROT 31.1* 28.0* 28.1*  INR 2.92 2.56 2.57  CREATININE 0.61  --   --     Estimated Creatinine Clearance: 61.7 mL/min (by C-G formula based on SCr of 0.61 mg/dL).   Medical History: Past Medical History:  Diagnosis Date  . A-fib ([place])   . Hypertension     Medications:  Pharmacy consulted to dose and monitor warfarin in this 76 year old female who was taking warfarin prior to admission for a history of atrial fibrillation.   INR of 2.23 was therapeutic on admission Last dose PTA was on 5/13  Home regimen:   Warfarin 2 mg and 2.5 mg taken on alternating days  Dosing history: Date INR Dose 5/14 2.23 2.5 mg dose given early AM on 5/15 during admission 5/15 2.18 2 mg 5/16 3.53 HELD 5/17 3.87 HELD 5/18 2.92 2 mg 5/19 2.56 2 mg 5/20 2.57    Goal of Therapy:  INR 2-3  Plan:  Continue warfarin 2 mg daily. Will continue to check daily INRs for now.   Napoleon Form, PharmD, BCPS Clinical Pharmacist 08/10/2016,10:15 AM

## 2016-08-11 ENCOUNTER — Institutional Professional Consult (permissible substitution): Payer: Self-pay | Admitting: Internal Medicine

## 2016-08-11 LAB — GLUCOSE, CAPILLARY
GLUCOSE-CAPILLARY: 104 mg/dL — AB (ref 65–99)
GLUCOSE-CAPILLARY: 115 mg/dL — AB (ref 65–99)
Glucose-Capillary: 112 mg/dL — ABNORMAL HIGH (ref 65–99)
Glucose-Capillary: 137 mg/dL — ABNORMAL HIGH (ref 65–99)

## 2016-08-11 LAB — PROTIME-INR
INR: 2.94
Prothrombin Time: 31.3 seconds — ABNORMAL HIGH (ref 11.4–15.2)

## 2016-08-11 MED ORDER — WARFARIN SODIUM 1 MG PO TABS
1.0000 mg | ORAL_TABLET | Freq: Every day | ORAL | Status: DC
  Filled 2016-08-11: qty 1

## 2016-08-11 MED ORDER — FUROSEMIDE 10 MG/ML IJ SOLN
40.0000 mg | Freq: Two times a day (BID) | INTRAMUSCULAR | Status: DC
  Administered 2016-08-11 – 2016-08-14 (×6): 40 mg via INTRAVENOUS
  Filled 2016-08-11 (×6): qty 4

## 2016-08-11 NOTE — Progress Notes (Signed)
[facility] [place] NAME: Sidda Humm    MR#:  [id number]  DATE OF BIRTH:  04-Jul-1940  SUBJECTIVE:  Pt  feels better today. Less shortness of breath.  CHIEF COMPLAINT:   Chief Complaint  Patient presents with  . Weakness  . Shortness of Breath   Still has SOB and cough. LE edema On 3 L O2   Review of Systems  Constitutional: Positive for malaise/fatigue. Negative for chills, fever and weight loss.  HENT: Negative for congestion.   Eyes: Negative for blurred vision and double vision.  Respiratory: Positive for shortness of breath. Negative for sputum production and wheezing.   Cardiovascular: Negative for chest pain, palpitations, orthopnea, leg swelling and PND.  Gastrointestinal: Negative for abdominal pain, blood in stool, constipation, nausea and vomiting.  Genitourinary: Negative for dysuria, frequency, hematuria and urgency.  Musculoskeletal: Negative for falls.  Neurological: Positive for weakness. Negative for dizziness, tremors, focal weakness and headaches.  Endo/Heme/Allergies: Does not bruise/bleed easily.  Psychiatric/Behavioral: Negative for depression. The patient does not have insomnia.     VITAL SIGNS: Blood pressure (!) 106/55, pulse (!) 57, temperature 98.3 F (36.8 C), resp. rate 19, height 5\' 1"  (1.549 m), weight 92 kg (202 lb 14.4 oz), SpO2 94 %.  PHYSICAL EXAMINATION:   GENERAL:  76 y.o.-year-old patient lying in the bed in no distress.  EYES: Pupils equal, round, reactive to light and accommodation. No scleral icterus. Extraocular muscles intact.  HEENT: Head atraumatic, normocephalic. Oropharynx and nasopharynx clear.  NECK:  Supple, jugular venous distention. No thyroid enlargement, no tenderness.  LUNGS: Diminished breath sounds bilaterally, no wheezing,    Not using accessory muscles of respiration .  CARDIOVASCULAR: S1, S2 irregularly irregular, heart rate is better controlled. No murmurs,  rubs, or gallops.  ABDOMEN: Soft, nontender, nondistended. Bowel sounds present. No organomegaly or mass.  EXTREMITIES: Trace lower extremity and pedal edema, no cyanosis, or clubbing.  NEUROLOGIC: Cranial nerves II through XII are intact. Muscle strength 5/5 in all extremities. Sensation intact. Gait not checked.  PSYCHIATRIC: The patient is alert and oriented x 3.  SKIN: No obvious rash, lesion, or ulcer.   ORDERS/RESULTS REVIEWED:   CBC  Recent Labs Lab 08/04/16 1602 08/06/16 1131 08/07/16 0600 08/08/16 0457  WBC 9.5 10.5 8.4 9.0  HGB 13.2 12.7 11.2* 11.7*  HCT 39.2 38.1 33.5* 35.1  PLT 273 222 182 187  MCV 84.8 84.3 85.1 84.7  MCH 28.5 28.0 28.5 28.1  MCHC 33.6 33.3 33.5 33.2  RDW 15.5* 15.6* 15.2* 15.4*  LYMPHSABS  --  1.6  --   --   MONOABS  --  0.9  --   --   EOSABS  --  0.1  --   --   BASOSABS  --  0.1  --   --    ------------------------------------------------------------------------------------------------------------------  Chemistries   Recent Labs Lab 08/04/16 1602 08/06/16 1131 08/07/16 0600 08/08/16 0457  NA 140 142 144 143  K 3.7 3.6 3.2* 3.6  CL 101 107 107 106  CO2 28 27 30 30   GLUCOSE 142* 138* 110* 119*  BUN 15 19 23* 24*  CREATININE 0.59 0.71 0.67 0.61  CALCIUM 9.0 8.8* 8.2* 8.5*  MG  --   --  2.1 2.2   ------------------------------------------------------------------------------------------------------------------ estimated creatinine clearance is 61.9 mL/min (by C-G formula based on SCr of 0.61 mg/dL). ------------------------------------------------------------------------------------------------------------------ No results for input(s): TSH, T4TOTAL, T3FREE, THYROIDAB in the last 72 hours.  Invalid input(s):  FREET3  Cardiac Enzymes  Recent Labs Lab 08/06/16 0349  TROPONINI <0.03   ------------------------------------------------------------------------------------------------------------------ Invalid input(s):  POCBNP ---------------------------------------------------------------------------------------------------------------  RADIOLOGY: No results found.  EKG:  Orders placed or performed during the hospital encounter of 08/04/16  . EKG 12-Lead  . EKG 12-Lead  . ED EKG within 10 minutes  . ED EKG within 10 minutes  . EKG 12-Lead  . EKG 12-Lead  . EKG 12-Lead  . EKG 12-Lead   ASSESSMENT AND PLAN:  Active Problems:   Community acquired pneumonia   # Acute respiratory failure with hypoxia due to acute on chronic diastolic chf - POA  wean off as tolerated, patient  is to be continued on intravenous Lasix. Patient was seen by pulmonologist, who did not feel that she had pneumonia,  now off antibiotic therapy,  stable clinically, no fevers, normal white blood cell count. Continue incentive spirometer. May need O2 at discharge   # A. fib, RVR, now off Cardizem IV drip, continue CardizemCD, Metoprolol, Flecainide, Ttroponin was negative.  Coumadin dosing as per pharmacy. Echocardiogram revealed normal ejection fraction, mild aortic stenosis, mild mitral regurgitation  # Essential hypertension  continue Cardizem and metoprolol  # Diarrheal stool, negative for C. difficile, gastrointestinal panel is negative, diarrheal stool subsided  DRUG ALLERGIES:  Allergies  Allergen Reactions  . Ampicillin   . Penicillins   . Prinzide  [Lisinopril-Hydrochlorothiazide]     CODE STATUS:     Code Status Orders        Start     Ordered   08/04/16 2222  Full code  Continuous     08/04/16 2221    Code Status History    Date Active Date Inactive Code Status Order ID Comments User Context   02/29/2016  8:53 PM 03/03/2016  2:03 PM Full Code [id number]  Henreitta Leber, MD Inpatient      TOTAL TIME TAKING CARE OF THIS PATIENT: 35 minutes  Hillary Bow R M.D on 08/11/2016 at 12:34 PM  Between 7am to 6pm - Pager - [phone]  After 6pm go to [email or web address] - password [email or web address] [facility]  [facility]  Office  [phone]  CC: Primary care physician; Jerrol Banana., MD

## 2016-08-11 NOTE — Care Management (Signed)
Have made a second request for physical therapy / occupational therapy if not medically contraindicated

## 2016-08-11 NOTE — Evaluation (Signed)
Physical Therapy Evaluation Patient Details Name: Hannah Faulkner MRN: [id number] DOB: 07-Dec-1940 Today's Date: 08/11/2016   History of Present Illness  Hannah Faulkner is a 76yo white female, comes to [facility] on 5/14 p DOE/cough/fever, admitted for CAP, moved to step-down on 5/16 after afib with RVR, now on telemetry unit. PMH: afib, HTN, GAD, depression, DM2, HL:D, OSA. Patient lives with grandson, who assists with transportation as needed. The patient performs household AMB using furniture for stability at [place]. She has a SPC, but does not use.   Clinical Impression  Pt admitted with above diagnosis. Pt currently with functional limitations due to the deficits listed below (see "PT Problem List"). Upon entry, the patient is received seated edge of chair, leaning forward for ease of breathing. No acute distress noted at this time, pt only reporting some mild dyspnea and frequent voiding due to diuertic. Pt desatting to 89% on RA after 3 minutes, and 93% after 155ft AMB on 2L. HR ranging from 110-120s during peak exertion, with pair PVC. Since admission in December, independence with mobility and tolerance to activity remain slightly decreased from her prior level at that admission. Pt demonstrating mildly greater gait instability since 6MA.  Pt will benefit from skilled PT intervention to increase independence and safety with basic mobility in preparation for discharge to the venue listed below.       Follow Up Recommendations Home health PT    Equipment Recommendations  None recommended by PT    Recommendations for Other Services       Precautions / Restrictions Precautions Precautions: Fall      Mobility  Bed Mobility               General bed mobility comments: received in chair  Transfers Overall transfer level: Independent Equipment used: None                Ambulation/Gait Ambulation/Gait assistance: Supervision Ambulation Distance (Feet): 120 Feet Assistive  device: None     Gait velocity interpretation: <1.8 ft/sec, indicative of risk for recurrent falls General Gait Details: slow, mildly unsteady, cautious, but ultimately does not use fixed objects in room for stability.   Stairs            Wheelchair Mobility    Modified Rankin (Stroke Patients Only)       Balance Overall balance assessment: No apparent balance deficits (not formally assessed);Modified Independent                                           Pertinent Vitals/Pain Pain Assessment: No/denies pain    Home Living Family/patient expects to be discharged to:: Private residence Living Arrangements: Other relatives (grandson ) Available Help at Discharge: Family (Son works during day; has 2 sisters, unlikely to provide much assistance. ) Type of Home: House Home Access: Stairs to enter Entrance Stairs-Rails: Can reach both Entrance Stairs-Number of Steps: 3 Home Layout: Two level;Full bath on main level;Able to live on main level with bedroom/bathroom Home Equipment: Bedside commode;Cane - single point      Prior Function Level of Independence: Independent         Comments: Mostly household distances with furniture for stability since admission in December 2017.      Hand Dominance        Extremity/Trunk Assessment        Lower Extremity Assessment Lower Extremity Assessment:  Generalized weakness;Overall WFL for tasks assessed       Communication   Communication: No difficulties  Cognition Arousal/Alertness: Awake/alert Behavior During Therapy: WFL for tasks assessed/performed Overall Cognitive Status: Within Functional Limits for tasks assessed                                        General Comments      Exercises     Assessment/Plan    PT Assessment Patient needs continued PT services  PT Problem List Decreased strength;Decreased activity tolerance;Decreased mobility;Cardiopulmonary status  limiting activity;Decreased balance;Obesity       PT Treatment Interventions Stair training;Gait training;Functional mobility training;Therapeutic activities;Therapeutic exercise;Balance training;Patient/family education    PT Goals (Current goals can be found in the Care Plan section)  Acute Rehab PT Goals Patient Stated Goal: regain strength and ability to breath PT Goal Formulation: With patient Time For Goal Achievement: 21-Aug-2016 Potential to Achieve Goals: Good    Frequency Min 2X/week   Barriers to discharge Decreased caregiver support      Co-evaluation               AM-PAC PT "6 Clicks" Daily Activity  Outcome Measure Difficulty turning over in bed (including adjusting bedclothes, sheets and blankets)?: A Little Difficulty moving from lying on back to sitting on the side of the bed? : A Little Difficulty sitting down on and standing up from a chair with arms (e.g., wheelchair, bedside commode, etc,.)?: A Little Help needed moving to and from a bed to chair (including a wheelchair)?: A Little Help needed walking in hospital room?: None Help needed climbing 3-5 steps with a railing? : A Little 6 Click Score: 19    End of Session Equipment Utilized During Treatment: Gait belt;Oxygen Activity Tolerance: Patient tolerated treatment well Patient left: in chair;with chair alarm set;Other (comment) (feet elevated due to LEE; chair alarm donned due to drowsiness. ) Nurse Communication: Mobility status PT Visit Diagnosis: Unsteadiness on feet (R26.81);Muscle weakness (generalized) (M62.81);Difficulty in walking, not elsewhere classified (R26.2)    Time: 9539-6728 PT Time Calculation (min) (ACUTE ONLY): 21 min   Charges:   PT Evaluation $PT Eval Moderate Complexity: 1 Procedure PT Treatments $Therapeutic Activity: 8-22 mins   PT G Codes:        2:14 PM, Aug 21, 2016 Etta Grandchild, PT, DPT Physical Therapist - [place] [phone] ([place])  [phone]  (mobile)   Buccola,Allan C 08/21/16, 2:10 PM

## 2016-08-11 NOTE — Progress Notes (Signed)
ANTICOAGULATION CONSULT NOTE - Initial Consult  Pharmacy Consult for warfarin dosing Indication: atrial fibrillation  Allergies  Allergen Reactions  . Ampicillin   . Penicillins   . Prinzide  [Lisinopril-Hydrochlorothiazide]     Patient Measurements: Height: 5\' 1"  (154.9 cm) Weight: 202 lb 14.4 oz (92 kg) IBW/kg (Calculated) : 47.8  Vital Signs: Temp: 98.3 F (36.8 C) (05/21 1149) Temp Source: Oral (05/21 0800) BP: 106/55 (05/21 1149) Pulse Rate: 57 (05/21 1149)  Labs:  Recent Labs  08/09/16 0526 08/10/16 0438 08/11/16 0542  LABPROT 28.0* 28.1* 31.3*  INR 2.56 2.57 2.94    Estimated Creatinine Clearance: 61.9 mL/min (by C-G formula based on SCr of 0.61 mg/dL).   Medical History: Past Medical History:  Diagnosis Date  . A-fib ([place])   . Hypertension     Medications:  Pharmacy consulted to dose and monitor warfarin in this 76 year old female who was taking warfarin prior to admission for a history of atrial fibrillation.   INR of 2.23 was therapeutic on admission Last dose PTA was on 5/13  Home regimen:   Warfarin 2 mg and 2.5 mg taken on alternating days  Dosing history: Date INR Dose 5/14 2.23 2.5 mg dose given early AM on 5/15 during admission 5/15 2.18 2 mg 5/16 3.53 HELD 5/17 3.87 HELD 5/18 2.92 2 mg 5/19 2.56 2 mg 5/20 2.57 2 mg 5/21     2.94   Goal of Therapy:  INR 2-3  Plan:  Will decrease to warfarin 1 mg daily. Will continue to check daily INRs for now.   Paulina Fusi, PharmD, BCPS 08/11/2016 1:15 PM

## 2016-08-11 NOTE — Care Management (Signed)
Discussed the need for home oxygen assessment for patient.  She is agreeable to home health services and no agency preference.  Called [facility] to review case and determine if can accept patient's insurance.

## 2016-08-11 NOTE — Evaluation (Signed)
Occupational Therapy Evaluation Patient Details Name: Hannah Faulkner MRN: [id number] DOB: [birth year] Today's Date: 08/11/2016    History of Present Illness Pt is a 76yo white female, comes to [facility] on 5/14 w/ DOE/cough/fever, admitted for CAP, moved to step-down on 5/16 after afib with RVR, now on telemetry unit. PMH: afib, HTN, GAD, depression, DM2, HL:D, OSA. Patient lives in single family home with grandson, who assists with transportation as needed. The patient performs household AMB using furniture for stability at [place]. She has a SPC, but does not use.    Clinical Impression   Pt seen for OT evaluation this date. Pt presents with impaired activity tolerance and O2 desaturation during light activity. Pt ambulated from recliner to toilet with supervision-min guard and once seated on toilet, O2 desat to 84%, increasing to >91% within 1 minute with verbal cues for pursed lip breathing after initial education/training in PLB techniques. Pt educated in energy conservation strategies to support independence, O2 sats, and falls prevention in the home including benefits of home/routines modifications such as grab bars and a shower chair, sitting for shower and for some aspects of meal prep. Pt would benefit from skilled OT services to address noted impairments and functional deficits in order to maximize return to PLOF and minimize falls risk and rehospitalization.     Follow Up Recommendations  Home health OT    Equipment Recommendations  3 in 1 bedside commode;Other (comment) (shower chair, handheld shower head, grab bars in bathroom)    Recommendations for Other Services       Precautions / Restrictions Precautions Precautions: Fall Restrictions Weight Bearing Restrictions: No      Mobility Bed Mobility               General bed mobility comments: deferred due to up in recliner for session  Transfers Overall transfer level: Needs assistance Equipment used:  None Transfers: Sit to/from Stand Sit to Stand: Supervision         General transfer comment: increased effort, supervision for safety    Balance Overall balance assessment: Needs assistance Sitting-balance support: No upper extremity supported;Feet supported Sitting balance-Leahy Scale: Normal     Standing balance support: During functional activity Standing balance-Leahy Scale: Good Standing balance comment: ocassional single extremity support, no LOB noted                           ADL either performed or assessed with clinical judgement   ADL Overall ADL's : Needs assistance/impaired     Grooming: Standing;Supervision/safety;Wash/dry Teacher, music: Supervision/safety;Ambulation;Regular Toilet   Toileting- Water quality scientist and Hygiene: Independent;Sit to/from stand       Functional mobility during ADLs: Supervision/safety (would benefit from [facility] or RW for additional stability during ambulation versus fingertip touch on furniture around the room) General ADL Comments: pt generally able to perform self care tasks with additional time and effort to perform; limited by O2 desats with activity as low as 84% increasing to max 95% within 1 minute with verbal cues for pursed lip breathing      Vision Baseline Vision/History: Wears glasses Wears Glasses: At all times Patient Visual Report: No change from baseline Vision Assessment?: No apparent visual deficits     Perception     Praxis      Pertinent Vitals/Pain Pain Assessment: No/denies pain     Hand Dominance  Right   Extremity/Trunk Assessment Upper Extremity Assessment Upper Extremity Assessment: Overall WFL for tasks assessed   Lower Extremity Assessment Lower Extremity Assessment: Defer to PT evaluation;Overall [facility] for tasks assessed   Cervical / Trunk Assessment Cervical / Trunk Assessment: Normal   Communication Communication Communication: No  difficulties   Cognition Arousal/Alertness: Awake/alert Behavior During Therapy: WFL for tasks assessed/performed Overall Cognitive Status: Within Functional Limits for tasks assessed                                     General Comments  pt noted to have several wrinkles in compression stockings leaving indentations in BLE, OT smoothed out to minimize edema/tourniquet effect on BLE and educated pt on importance of ensuring stockings are smooth without wrinkles    Exercises Other Exercises Other Exercises: pt educated in energy conservation strategies (handout provided) and home/routines modifications to support falls prevention and functional independence with ADL Other Exercises: pt educated in pursed lip breathing techniques and able to return demonstrate good technique with verbal cues to utilize throughout session to improve O2 sats   Shoulder Instructions      Home Living Family/patient expects to be discharged to:: Private residence Living Arrangements: Other relatives (grandson lives with her) Available Help at Discharge: Family;Available PRN/intermittently (son works during the day, 2 sisters unlikely to help much) Type of Home: House Home Access: Stairs to enter [employer] of Steps: 3 Entrance Stairs-Rails: Can reach both Home Layout: Two level;Full bath on main level;Able to live on main level with bedroom/bathroom     Bathroom Shower/Tub: Walk-in shower (has both, but uses walk in shower)   Biochemist, clinical: Standard     Home Equipment: Kasandra Knudsen - single point   Additional Comments: notes she used to have [facility] but lent it to a relative who has not returned it and she is unsure if she will get it back      Prior Functioning/Environment Level of Independence: Needs assistance  Gait / Transfers Assistance Needed: Mostly household distances with furniture for stability since admission in December 2017; doesn't use [facility] but has one ADL's / Homemaking  Assistance Needed: indep with ADL, reports she does drive but son drives her sometimes too; no falls in past 12 months; manages urinary leakage with UI products    Comments: Mostly household distances with furniture for stability since admission in December 2017.         OT Problem List: Cardiopulmonary status limiting activity;Decreased activity tolerance      OT Treatment/Interventions: Self-care/ADL training;Therapeutic exercise;Therapeutic activities;Energy conservation;DME and/or AE instruction;Patient/family education    OT Goals(Current goals can be found in the care plan section) Acute Rehab OT Goals Patient Stated Goal: feel better OT Goal Formulation: With patient Time For Goal Achievement: 08/25/16 Potential to Achieve Goals: Good  OT Frequency: Min 2X/week   Barriers to D/C:            Co-evaluation              AM-PAC PT "6 Clicks" Daily Activity     Outcome Measure Help from another person eating meals?: None Help from another person taking care of personal grooming?: None Help from another person toileting, which includes using toliet, bedpan, or urinal?: A Little Help from another person bathing (including washing, rinsing, drying)?: A Little Help from another person to put on and taking off regular upper body clothing?: None Help from another  person to put on and taking off regular lower body clothing?: A Little 6 Click Score: 21   End of Session    Activity Tolerance: Other (comment) (pt limited somewhat by poor activity tolerance with O2 desats with minimal activity) Patient left: in chair;with call bell/phone within reach;with chair alarm set (pt educated in importance of chair alarm and need for assistance to get out of bed for falls prevention)  OT Visit Diagnosis: Other abnormalities of gait and mobility (R26.89)                Time: 8177-1165 OT Time Calculation (min): 41 min Charges:  OT General Charges $OT Visit: 1 Procedure OT  Evaluation $OT Eval Low Complexity: 1 Procedure OT Treatments $Self Care/Home Management : 23-37 mins G-Codes:     Jeni Salles, MPH, MS, OTR/L ascom [phone] 08/11/16, 4:16 PM

## 2016-08-11 NOTE — Progress Notes (Signed)
[facility] Encounter Note  Patient: Hannah Faulkner / Admit Date: 08/04/2016 / Date of Encounter: 08/11/2016, 8:35 AM   Subjective:  Patient has been feeling relatively better since hospital admission although is significantly short of breath weak and fatigued with any physical activity multifactorial in nature including the majority of hypoxia and inflammatory lung disease at this time. This is exacerbating her atrial fibrillation with rapid ventricular rate despite high dosages of medication management for heart rate control for which currently is well-controlled at rest and overall improved since yesterday  Review of Systems: Positive for: Shortness of breath cough congestion with some improvements Negative for: Vision change, hearing change, syncope, dizziness, nausea, vomiting,diarrhea, bloody stool, stomach pain, positive for cough, congestion, negative for diaphoresis, urinary frequency, urinary pain,skin lesions, skin rashes Others previously listed  Objective: Telemetry: Atrial fibrillation with controlled ventricular rate Physical Exam: Blood pressure 119/72, pulse 66, temperature 97.5 F (36.4 C), temperature source Oral, resp. rate 18, height 5\' 1"  (1.549 m), weight 92 kg (202 lb 14.4 oz), SpO2 97 %. Body mass index is 38.34 kg/m. General: Well developed, well nourished, in no acute distress. Head: Normocephalic, atraumatic, sclera non-icteric, no xanthomas, nares are without discharge. Neck: No apparent masses Lungs: Normal respirations with diffuse wheezes, some rhonchi, no rales , no crackles   Heart: Irregular rate and rhythm, normal S1 S2, no murmur, no rub, no gallop, PMI is normal size and placement, carotid upstroke normal without bruit, jugular venous pressure normal Abdomen: Soft, non-tender, non-distended with normoactive bowel sounds. No hepatosplenomegaly. Abdominal aorta is normal size without bruit Extremities: Trace edema, no clubbing, no  cyanosis, no ulcers,  Peripheral: 2+ radial, 2+ femoral, 2+ dorsal pedal pulses Neuro: Alert and oriented. Moves all extremities spontaneously. Psych:  Responds to questions appropriately with a normal affect.   Intake/Output Summary (Last 24 hours) at 08/11/16 0835 Last data filed at 08/11/16 0416  Gross per 24 hour  Intake              358 ml  Output              650 ml  Net             -292 ml    Inpatient Medications:  . dextromethorphan  30 mg Oral BID   And  . guaiFENesin  600 mg Oral BID  . diltiazem  180 mg Oral BID  . flecainide  50 mg Oral Q12H  . furosemide  20 mg Intravenous BID  . gabapentin  100 mg Oral TID  . insulin aspart  0-15 Units Subcutaneous TID WC  . insulin aspart  0-5 Units Subcutaneous QHS  . metoprolol tartrate  50 mg Oral BID  . potassium chloride  20 mEq Oral Daily  . cyanocobalamin  1,000 mcg Oral Daily  . warfarin  2 mg Oral q1800  . Warfarin - Pharmacist Dosing Inpatient   Does not apply q1800   Infusions:    Labs: No results for input(s): NA, K, CL, CO2, GLUCOSE, BUN, CREATININE, CALCIUM, MG, PHOS in the last 72 hours. No results for input(s): AST, ALT, ALKPHOS, BILITOT, PROT, ALBUMIN in the last 72 hours. No results for input(s): WBC, NEUTROABS, HGB, HCT, MCV, PLT in the last 72 hours. No results for input(s): CKTOTAL, CKMB, TROPONINI in the last 72 hours. Invalid input(s): POCBNP No results for input(s): HGBA1C in the last 72 hours.   Weights: Filed Weights   08/04/16 1556 08/04/16 2228 08/11/16 0413  Weight:  85.7 kg (189 lb) 91.5 kg (201 lb 12.8 oz) 92 kg (202 lb 14.4 oz)     Radiology/Studies:  Dg Chest 2 View  Result Date: 08/04/2016 CLINICAL DATA:  76 year old female with increased weakness and shortness of breath for 2 weeks. Uncontrolled atrial fibrillation. EXAM: CHEST  2 VIEW COMPARISON:  07/28/2016 and earlier. FINDINGS: Streaky and patchy bibasilar pulmonary opacity with mild progression since 07/28/2016. Stable  cardiomegaly and mediastinal contours. No pneumothorax. Pulmonary vascularity appears stable. No definite pleural effusion. Negative visible bowel gas pattern. No acute osseous abnormality identified. Visualized tracheal air column is within normal limits. IMPRESSION: 1. Interval progression of Patchy and confluent bibasilar opacity. This is nonspecific but consider bilateral pneumonia or aspiration. 2. Stable cardiomegaly. No acute pulmonary edema, and no pleural effusion identified. Electronically Signed   By: Genevie Ann M.D.   On: 08/04/2016 17:18   Dg Chest 2 View  Result Date: 07/28/2016 CLINICAL DATA:  Pneumonia. EXAM: CHEST  2 VIEW COMPARISON:  05/28/2016 FINDINGS: The cardio pericardial silhouette is enlarged. There is pulmonary vascular congestion without overt pulmonary edema. Slight improvement in right base patchy opacity. Left base patchy airspace disease not substantially changed. No substantial pleural effusion. The visualized bony structures of the thorax are intact. IMPRESSION: Interval improvement in right basilar aeration with stable patchy airspace opacity at the left base. Electronically Signed   By: Misty Stanley M.D.   On: 07/28/2016 17:10   Ct Chest Wo Contrast  Result Date: 08/06/2016 CLINICAL DATA:  Pneumonia.  Cough and shortness of breath. EXAM: CT CHEST WITHOUT CONTRAST TECHNIQUE: Multidetector CT imaging of the chest was performed following the standard protocol without IV contrast. COMPARISON:  Chest radiographs 08/04/2016 FINDINGS: Cardiovascular: Normal caliber of the thoracic aorta with mild atherosclerotic calcification. Mild enlargement of the main pulmonary artery may reflect underlying pulmonary arterial hypertension. Mild coronary artery calcification. Mild cardiomegaly. No pericardial effusion. Mediastinum/Nodes: There is mild nodular enlargement of the left thyroid lobe and isthmus, with an approximately 1.6 cm nodule extending from the isthmus and with a left lower pole  nodule difficult to discretely measure. There is an increased number of small lymph nodes throughout the mediastinum measuring up to 1.1 cm in short axis, likely reactive. No axillary or definite hilar lymphadenopathy is identified on this unenhanced study. Unremarkable esophagus. Lungs/Pleura: There are small pleural effusions, right larger than left. Dense consolidation is present in the right lower lobe with air bronchograms. There are patchy areas of consolidation in the left lower lobe, right middle lobe, and lingula. Minimal nodular opacity is present anteriorly in the right upper lobe. Mosaic attenuation of both upper lobes may reflect mosaic perfusion of vascular origin given evidence of pulmonary hypertension. Upper Abdomen: There is prominence of the lateral segment of the left hepatic lobe which demonstrates a mildly nodular contour. There may also be a recanalized paraumbilical vein. Musculoskeletal: Moderate thoracic disc degeneration. No suspicious osseous lesion. IMPRESSION: 1. Dense right lower lobe consolidation with patchy consolidation elsewhere in both lungs compatible with pneumonia. 2. Small pleural effusions, right larger than left. 3. Findings suggesting pulmonary arterial hypertension. 4. Morphologic liver changes which may reflect underlying cirrhosis. 5.  Aortic Atherosclerosis (ICD10-I70.0). Electronically Signed   By: Logan Bores M.D.   On: 08/06/2016 09:46     Assessment and Recommendation  76 y.o. female with the acute pneumonia and hypoxia causing paroxysmal nonvalvular atrial fibrillation with rapid ventricular rate now improving with appropriate medication management without evidence of heart failure and/or myocardial infarction 1.  Continue supportive care of bronchitis respiratory failure with inhalers andPrednisoneWhich appears to be improving well 2. Continue oral medication management including higher dose metoprolol and diltiazem for heart rate control between 60 and 90  bpm as able and currently showing that she has had good control and no need for adjustments today   3. Anticoagulation with heparin and other use of anticoagulation orally for further risk reduction of stroke with atrial fibrillation 4. Continue ambulation and follow for need in adjustments of medication management for heart rate control although stable today 5. No further cardiac diagnostics necessary at this time 6.  Continue ambulation and follow for need for adjustments of medications with possible discharged home from cardiac standpoint with follow-up next week  Signed, Serafina Royals M.D. FACC

## 2016-08-12 ENCOUNTER — Inpatient Hospital Stay: Payer: Medicare Other

## 2016-08-12 LAB — BASIC METABOLIC PANEL
Anion gap: 4 — ABNORMAL LOW (ref 5–15)
BUN: 25 mg/dL — AB (ref 6–20)
CALCIUM: 8.6 mg/dL — AB (ref 8.9–10.3)
CO2: 34 mmol/L — AB (ref 22–32)
CREATININE: 0.64 mg/dL (ref 0.44–1.00)
Chloride: 104 mmol/L (ref 101–111)
GFR calc Af Amer: >60 mL/min (ref >60)
GFR calc non Af Amer: >60 mL/min (ref >60)
GLUCOSE: 93 mg/dL (ref 65–99)
Potassium: 3.5 mmol/L (ref 3.5–5.1)
Sodium: 142 mmol/L (ref 135–145)

## 2016-08-12 LAB — GLUCOSE, CAPILLARY
Glucose-Capillary: 87 mg/dL (ref 65–99)
Glucose-Capillary: 125 mg/dL — ABNORMAL HIGH (ref 65–99)
Glucose-Capillary: 151 mg/dL — ABNORMAL HIGH (ref 65–99)
Glucose-Capillary: 179 mg/dL — ABNORMAL HIGH (ref 65–99)

## 2016-08-12 LAB — PROTIME-INR
INR: 2.78
Prothrombin Time: 29.9 seconds — ABNORMAL HIGH (ref 11.4–15.2)

## 2016-08-12 MED ORDER — DOCUSATE SODIUM 100 MG PO CAPS
200.0000 mg | ORAL_CAPSULE | Freq: Two times a day (BID) | ORAL | Status: DC
  Administered 2016-08-12 – 2016-08-14 (×4): 200 mg via ORAL
  Filled 2016-08-12 (×5): qty 2

## 2016-08-12 MED ORDER — POTASSIUM CHLORIDE CRYS ER 20 MEQ PO TBCR
20.0000 meq | EXTENDED_RELEASE_TABLET | Freq: Once | ORAL | Status: AC
  Administered 2016-08-12: 20 meq via ORAL
  Filled 2016-08-12: qty 1

## 2016-08-12 MED ORDER — WARFARIN SODIUM 1 MG PO TABS
2.0000 mg | ORAL_TABLET | Freq: Every day | ORAL | Status: DC
  Administered 2016-08-12 – 2016-08-13 (×2): 2 mg via ORAL
  Filled 2016-08-12 (×2): qty 2

## 2016-08-12 MED ORDER — METOLAZONE 2.5 MG PO TABS
5.0000 mg | ORAL_TABLET | Freq: Once | ORAL | Status: AC
  Administered 2016-08-12: 5 mg via ORAL
  Filled 2016-08-12: qty 2

## 2016-08-12 NOTE — Progress Notes (Signed)
Physical Therapy Treatment Patient Details Name: Hannah Faulkner MRN: [id number] DOB: 08/19/1940 Today's Date: 08/12/2016    History of Present Illness Pt is a 76yo white female, comes to [facility] on 5/14 w/ DOE/cough/fever, admitted for CAP, moved to step-down on 5/16 after afib with RVR, now on telemetry unit. PMH: afib, HTN, GAD, depression, DM2, HL:D, OSA. Patient lives in single family home with grandson, who assists with transportation as needed. The patient performs household AMB using furniture for stability at [place]. She has a SPC, but does not use.     PT Comments    Pt with overall supervision/modified independent for bed mobility, transfers and 120' gait in hallway with occasional use of handrails.  While she does feel more confident with occasionally touching firm surfaces with gait, she declined use of RW or cane at this time.     Follow Up Recommendations  Home health PT     Equipment Recommendations  None recommended by PT    Recommendations for Other Services       Precautions / Restrictions Precautions Precautions: Fall Restrictions Weight Bearing Restrictions: No    Mobility  Bed Mobility Overal bed mobility: Modified Independent                Transfers Overall transfer level: Modified independent   Transfers: Sit to/from Stand Sit to Stand: Modified independent (Device/Increase time)            Ambulation/Gait Ambulation/Gait assistance: Supervision Ambulation Distance (Feet): 120 Feet Assistive device: None Gait Pattern/deviations: WFL(Within Functional Limits)   Gait velocity interpretation: <1.8 ft/sec, indicative of risk for recurrent falls General Gait Details: reaches for railings in hallway but no LOB's   Stairs            Wheelchair Mobility    Modified Rankin (Stroke Patients Only)       Balance Overall balance assessment: Needs assistance Sitting-balance support: No upper extremity supported;Feet supported       Standing balance support: During functional activity Standing balance-Leahy Scale: Good Standing balance comment: ocassional single extremity support, no LOB noted                            Cognition Arousal/Alertness: Awake/alert Behavior During Therapy: WFL for tasks assessed/performed Overall Cognitive Status: Within Functional Limits for tasks assessed                                        Exercises      General Comments        Pertinent Vitals/Pain Pain Assessment: No/denies pain    Home Living                      Prior Function            PT Goals (current goals can now be found in the care plan section) Progress towards PT goals: Progressing toward goals    Frequency    Min 2X/week      PT Plan Current plan remains appropriate    Co-evaluation              AM-PAC PT "6 Clicks" Daily Activity  Outcome Measure  Difficulty turning over in bed (including adjusting bedclothes, sheets and blankets)?: None Difficulty moving from lying on back to sitting on the side of the bed? : None Difficulty sitting  down on and standing up from a chair with arms (e.g., wheelchair, bedside commode, etc,.)?: None Help needed moving to and from a bed to chair (including a wheelchair)?: None Help needed walking in hospital room?: A Little Help needed climbing 3-5 steps with a railing? : A Little 6 Click Score: 22    End of Session Equipment Utilized During Treatment: Gait belt;Oxygen Activity Tolerance: Patient tolerated treatment well Patient left: in chair;with chair alarm set;Other (comment)         Time: 2703-5009 PT Time Calculation (min) (ACUTE ONLY): 9 min  Charges:  $Gait Training: 8-22 mins                    G Codes:       {Happy Begeman, PTA 08/12/16, 12:03 PM

## 2016-08-12 NOTE — Progress Notes (Signed)
[facility] [place] NAME: Hannah Faulkner    MR#:  [id number]  DATE OF BIRTH:  [birth year]  SUBJECTIVE:  Pt  feels better today. Less shortness of breath.  CHIEF COMPLAINT:   Chief Complaint  Patient presents with  . Weakness  . Shortness of Breath   Been walking to the bathroom. Increase urine output. Still has shortness of breath and needing 3 L oxygen.   Review of Systems  Constitutional: Positive for malaise/fatigue. Negative for chills, fever and weight loss.  HENT: Negative for congestion.   Eyes: Negative for blurred vision and double vision.  Respiratory: Positive for shortness of breath. Negative for sputum production and wheezing.   Cardiovascular: Negative for chest pain, palpitations, orthopnea, leg swelling and PND.  Gastrointestinal: Negative for abdominal pain, blood in stool, constipation, nausea and vomiting.  Genitourinary: Negative for dysuria, frequency, hematuria and urgency.  Musculoskeletal: Negative for falls.  Neurological: Positive for weakness. Negative for dizziness, tremors, focal weakness and headaches.  Endo/Heme/Allergies: Does not bruise/bleed easily.  Psychiatric/Behavioral: Negative for depression. The patient does not have insomnia.     VITAL SIGNS: Blood pressure 131/73, pulse 84, temperature 97.6 F (36.4 C), temperature source Oral, resp. rate 17, height 5\' 1"  (1.549 m), weight 92 kg (202 lb 14.4 oz), SpO2 95 %.  PHYSICAL EXAMINATION:   GENERAL:  76 y.o.-year-old patient lying in the bed in no distress.  EYES: Pupils equal, round, reactive to light and accommodation. No scleral icterus. Extraocular muscles intact.  HEENT: Head atraumatic, normocephalic. Oropharynx and nasopharynx clear.  NECK:  Supple, jugular venous distention. No thyroid enlargement, no tenderness.  LUNGS: Diminished breath sounds bilaterally, no wheezing,    Not using accessory muscles of respiration .  CARDIOVASCULAR:  S1, S2 irregularly irregular, heart rate is better controlled. No murmurs, rubs, or gallops.  ABDOMEN: Soft, nontender, nondistended. Bowel sounds present. No organomegaly or mass.  EXTREMITIES: Trace lower extremity and pedal edema, no cyanosis, or clubbing.  NEUROLOGIC: Cranial nerves II through XII are intact. Muscle strength 5/5 in all extremities. Sensation intact. Gait not checked.  PSYCHIATRIC: The patient is alert and oriented x 3.  SKIN: No obvious rash, lesion, or ulcer.   ORDERS/RESULTS REVIEWED:   CBC  Recent Labs Lab 08/06/16 1131 08/07/16 0600 08/08/16 0457  WBC 10.5 8.4 9.0  HGB 12.7 11.2* 11.7*  HCT 38.1 33.5* 35.1  PLT 222 182 187  MCV 84.3 85.1 84.7  MCH 28.0 28.5 28.1  MCHC 33.3 33.5 33.2  RDW 15.6* 15.2* 15.4*  LYMPHSABS 1.6  --   --   MONOABS 0.9  --   --   EOSABS 0.1  --   --   BASOSABS 0.1  --   --    ------------------------------------------------------------------------------------------------------------------  Chemistries   Recent Labs Lab 08/06/16 1131 08/07/16 0600 08/08/16 0457 08/12/16 0614  NA 142 144 143 142  K 3.6 3.2* 3.6 3.5  CL 107 107 106 104  CO2 27 30 30  34*  GLUCOSE 138* 110* 119* 93  BUN 19 23* 24* 25*  CREATININE 0.71 0.67 0.61 0.64  CALCIUM 8.8* 8.2* 8.5* 8.6*  MG  --  2.1 2.2  --    ------------------------------------------------------------------------------------------------------------------ estimated creatinine clearance is 61.9 mL/min (by C-G formula based on SCr of 0.64 mg/dL). ------------------------------------------------------------------------------------------------------------------ No results for input(s): TSH, T4TOTAL, T3FREE, THYROIDAB in the last 72 hours.  Invalid input(s): FREET3  Cardiac Enzymes  Recent Labs Lab 08/06/16 0349  TROPONINI <0.03   ------------------------------------------------------------------------------------------------------------------  Invalid input(s):  POCBNP ---------------------------------------------------------------------------------------------------------------  RADIOLOGY: Dg Chest 2 View  Result Date: 08/12/2016 CLINICAL DATA:  CHF. EXAM: CHEST  2 VIEW COMPARISON:  CT 08/06/2016.  Chest x-ray 08/04/2016 . FINDINGS: Cardiomegaly with pulmonary vascular prominence and bilateral interstitial prominence with small bilateral pleural effusions. Lung volumes with prominent basilar atelectasis. Bibasilar pneumonia cannot be excluded . No pneumothorax. IMPRESSION: 1. Cardiomegaly with pulmonary vascular prominence and bilateral interstitial prominence and bilateral pleural effusions. Findings consistent CHF. 2. Low lung volumes with prominent bibasilar atelectasis. Bibasilar pneumonia cannot be excluded . Electronically Signed   By: Marcello Moores  Register   On: 08/12/2016 11:38    EKG:  Orders placed or performed during the hospital encounter of 08/04/16  . EKG 12-Lead  . EKG 12-Lead  . ED EKG within 10 minutes  . ED EKG within 10 minutes  . EKG 12-Lead  . EKG 12-Lead  . EKG 12-Lead  . EKG 12-Lead   ASSESSMENT AND PLAN:  Active Problems:   Community acquired pneumonia   # Acute respiratory failure with hypoxia due to acute on chronic diastolic chf - POA  wean off O2 as tolerated Repeat chest x-ray today continues to show pulmonary edema with bilateral pleural effusions. Lasix dose increased yesterday. We will add metolazone. Patient was seen by pulmonologist, who did not feel that she had pneumonia,  now off antibiotic therapy,  stable clinically, no fevers, normal white blood cell count. Continue incentive spirometer. May need O2 at discharge  Needs further diuresis and inpatient care.   # A. fib, RVR, now off Cardizem IV drip, continue CardizemCD, Metoprolol, Flecainide, Troponin was negative.  Coumadin dosing as per pharmacy. Echocardiogram revealed normal ejection fraction, mild aortic stenosis, mild mitral regurgitation  #  Essential hypertension  continue Cardizem and metoprolol  # Diarrhea - Resolved negative for C. difficile, gastrointestinal panel is negative  DRUG ALLERGIES:  Allergies  Allergen Reactions  . Ampicillin   . Penicillins   . Prinzide  [Lisinopril-Hydrochlorothiazide]     CODE STATUS:     Code Status Orders        Start     Ordered   08/04/16 2222  Full code  Continuous     08/04/16 2221    Code Status History    Date Active Date Inactive Code Status Order ID Comments User Context   02/29/2016  8:53 PM 03/03/2016  2:03 PM Full Code [id number]  Henreitta Leber, MD Inpatient      TOTAL TIME TAKING CARE OF THIS PATIENT: 79 minutes  Hillary Bow R M.D on 08/12/2016 at 1:40 PM  Between 7am to 6pm - Pager - [phone]  After 6pm go to [email or web address] - password [email or web address] [facility]  [facility]  Office  [phone]  CC: Primary care physician; Jerrol Banana., MD

## 2016-08-12 NOTE — Progress Notes (Signed)
ANTICOAGULATION CONSULT NOTE - Initial Consult  Pharmacy Consult for warfarin dosing Indication: atrial fibrillation  Allergies  Allergen Reactions  . Ampicillin   . Penicillins   . Prinzide  [Lisinopril-Hydrochlorothiazide]     Patient Measurements: Height: 5\' 1"  (154.9 cm) Weight: 202 lb 14.4 oz (92 kg) IBW/kg (Calculated) : 47.8  Vital Signs: Temp: 97.6 F (36.4 C) (05/22 0816) Temp Source: Oral (05/22 0816) BP: 131/73 (05/22 0816) Pulse Rate: 84 (05/22 0816)  Labs:  Recent Labs  08/10/16 0438 08/11/16 0542 08/12/16 0614  LABPROT 28.1* 31.3* 29.9*  INR 2.57 2.94 2.78  CREATININE  --   --  0.64    Estimated Creatinine Clearance: 61.9 mL/min (by C-G formula based on SCr of 0.64 mg/dL).   Medical History: Past Medical History:  Diagnosis Date  . A-fib ([place])   . Hypertension     Medications:  Pharmacy consulted to dose and monitor warfarin in this 75 year old female who was taking warfarin prior to admission for a history of atrial fibrillation.   INR of 2.23 was therapeutic on admission Last dose PTA was on 5/13  Home regimen:   Warfarin 2 mg and 2.5 mg taken on alternating days  Dosing history: Date INR Dose 5/14 2.23 2.5 mg dose given early AM on 5/15 during admission 5/15 2.18 2 mg 5/16 3.53 HELD 5/17 3.87 HELD 5/18 2.92 2 mg 5/19 2.56 2 mg 5/20 2.57 2 mg 5/21     2.94 Dose not given  5/22  2.78 2mg     Goal of Therapy:  INR 2-3  Plan:  Warfarin dose was not given on 5/21. No indication or charting for why dose was held. Will order warfarin 2mg  tonight. INR ordered with AM labs.   Pernell Dupre, PharmD, BCPS Clinical Pharmacist 08/12/2016 11:20 AM

## 2016-08-13 LAB — GLUCOSE, CAPILLARY
GLUCOSE-CAPILLARY: 147 mg/dL — AB (ref 65–99)
GLUCOSE-CAPILLARY: 112 mg/dL — AB (ref 65–99)
Glucose-Capillary: 122 mg/dL — ABNORMAL HIGH (ref 65–99)
Glucose-Capillary: 134 mg/dL — ABNORMAL HIGH (ref 65–99)

## 2016-08-13 LAB — BASIC METABOLIC PANEL
ANION GAP: 7 (ref 5–15)
BUN: 20 mg/dL (ref 6–20)
CHLORIDE: 97 mmol/L — AB (ref 101–111)
CO2: 37 mmol/L — AB (ref 22–32)
Calcium: 8.7 mg/dL — ABNORMAL LOW (ref 8.9–10.3)
Creatinine, Ser: 0.67 mg/dL (ref 0.44–1.00)
GFR calc Af Amer: >60 mL/min (ref >60)
GLUCOSE: 112 mg/dL — AB (ref 65–99)
POTASSIUM: 2.8 mmol/L — AB (ref 3.5–5.1)
SODIUM: 141 mmol/L (ref 135–145)

## 2016-08-13 LAB — PROTIME-INR
INR: 2.67
PROTHROMBIN TIME: 29 s — AB (ref 11.4–15.2)

## 2016-08-13 LAB — CBC
HEMATOCRIT: 34.7 % — AB (ref 35.0–47.0)
HEMOGLOBIN: 11.7 g/dL — AB (ref 12.0–16.0)
MCH: 28.7 pg (ref 26.0–34.0)
MCHC: 33.7 g/dL (ref 32.0–36.0)
MCV: 85.4 fL (ref 80.0–100.0)
Platelets: 177 10*3/uL (ref 150–440)
RBC: 4.07 MIL/uL (ref 3.80–5.20)
RDW: 15 % — ABNORMAL HIGH (ref 11.5–14.5)
WBC: 8.3 10*3/uL (ref 3.6–11.0)

## 2016-08-13 LAB — MAGNESIUM: Magnesium: 2 mg/dL (ref 1.7–2.4)

## 2016-08-13 MED ORDER — POTASSIUM CHLORIDE CRYS ER 20 MEQ PO TBCR
20.0000 meq | EXTENDED_RELEASE_TABLET | Freq: Every day | ORAL | Status: DC
  Administered 2016-08-14: 20 meq via ORAL
  Filled 2016-08-13: qty 1

## 2016-08-13 MED ORDER — METOLAZONE 2.5 MG PO TABS
5.0000 mg | ORAL_TABLET | Freq: Once | ORAL | Status: AC
  Administered 2016-08-13: 5 mg via ORAL
  Filled 2016-08-13: qty 2

## 2016-08-13 MED ORDER — POTASSIUM CHLORIDE CRYS ER 20 MEQ PO TBCR
40.0000 meq | EXTENDED_RELEASE_TABLET | ORAL | Status: AC
  Administered 2016-08-13 (×3): 40 meq via ORAL
  Filled 2016-08-13 (×3): qty 2

## 2016-08-13 NOTE — Progress Notes (Signed)
Occupational Therapy Treatment Patient Details Name: Hannah Faulkner MRN: [id number] DOB: [birth year] Today's Date: 08/13/2016    History of present illness Pt is a 76yo white female, comes to [facility] on 5/14 w/ DOE/cough/fever, admitted for CAP, moved to step-down on 5/16 after afib with RVR, now on telemetry unit. PMH: afib, HTN, GAD, depression, DM2, HL:D, OSA. Patient lives in single family home with grandson, who assists with transportation as needed. The patient performs household AMB using furniture for stability at [place]. She has a SPC, but does not use.    OT comments  Pt seen for OT treatment session this afternoon, agreeable despite being somewhat fatigued. On 1L O2 now with O2 sats >93% this session. Pt required verbal cues and visual cues to recall proper technique for pursed lip breathing as well as review of learned energy conservation strategies to support recall and carryover. Pt able to return demonstrate good technique using PLB after several verbal cues and verbalized understanding of ECS. Pt eager to return home. OT facilitated problem solving for O2 management at home, and falls risk considerations related to O2 mgt, pt verbalized understanding. Continue to progress towards OT goals.   Follow Up Recommendations  Home health OT    Equipment Recommendations  3 in 1 bedside commode;Other (comment) (shower chair, HH shower head, grab bars)    Recommendations for Other Services      Precautions / Restrictions Precautions Precautions: Fall Restrictions Weight Bearing Restrictions: No       Mobility Bed Mobility               General bed mobility comments: not tested; up in chair  Transfers Overall transfer level: Independent         General transfer comment: not tested, pt in recliner, agreeable to review of education provided seated in recliner    Balance Overall balance assessment: Modified Independent Sitting-balance support: No upper extremity  supported;Feet supported Sitting balance-Leahy Scale: Normal                                     ADL either performed or assessed with clinical judgement   ADL Overall ADL's : Needs assistance/impaired                                       General ADL Comments: pt generally able to perform self care tasks with additional time and effort to perform; O2 sats remained >93% during session, improving with verbal cues for PLB     Vision Baseline Vision/History: Wears glasses Wears Glasses: At all times Patient Visual Report: No change from baseline Vision Assessment?: No apparent visual deficits   Perception     Praxis      Cognition Arousal/Alertness: Awake/alert Behavior During Therapy: WFL for tasks assessed/performed Overall Cognitive Status: Within Functional Limits for tasks assessed                                 General Comments: slightly fatigued, but pleasant and agreeable to OT session        Exercises Other Exercises Other Exercises: Pt able to recall portion of proper technique for pursed lip breathing but required verbal cues and encouragement to breathe in through her nose versus her mouth to improve O2 sats  Other Exercises: pt unable to recall energy conservation strategies, OT briefly reviewed them. Pt has handout, pt verbalized understanding Other Exercises: OT facilitated problem solving O2 mgt, should pt need to go home with supplemental O2, and considerations to minimize falls risk, pt verbalized understanding/awareness of falls risk associated with long O2 tubing   Shoulder Instructions       General Comments      Pertinent Vitals/ Pain       Pain Assessment: No/denies pain  Home Living                                          Prior Functioning/Environment              Frequency  Min 2X/week        Progress Toward Goals  OT Goals(current goals can now be found in the care  plan section)  Progress towards OT goals: Progressing toward goals  Acute Rehab OT Goals Patient Stated Goal: feel better OT Goal Formulation: With patient Time For Goal Achievement: 08/25/16 Potential to Achieve Goals: Good  Plan Discharge plan remains appropriate;Frequency remains appropriate    Co-evaluation                 AM-PAC PT "6 Clicks" Daily Activity     Outcome Measure   Help from another person eating meals?: None Help from another person taking care of personal grooming?: None Help from another person toileting, which includes using toliet, bedpan, or urinal?: A Little Help from another person bathing (including washing, rinsing, drying)?: A Little Help from another person to put on and taking off regular upper body clothing?: None Help from another person to put on and taking off regular lower body clothing?: A Little 6 Click Score: 21    End of Session    OT Visit Diagnosis: Other abnormalities of gait and mobility (R26.89)   Activity Tolerance Patient tolerated treatment well   Patient Left in chair;with call bell/phone within reach;with chair alarm set   Nurse Communication          Time: 8099-8338 OT Time Calculation (min): 10 min  Charges: OT Treatments $Self Care/Home Management : 8-22 mins  Jeni Salles, MPH, MS, OTR/L ascom [phone] 08/13/16, 3:01 PM

## 2016-08-13 NOTE — Progress Notes (Signed)
Physical Therapy Treatment Patient Details Name: Hannah Faulkner MRN: [id number] DOB: 1941/03/18 Today's Date: 08/13/2016    History of Present Illness Pt is a 76yo white female, comes to [facility] on 5/14 w/ DOE/cough/fever, admitted for CAP, moved to step-down on 5/16 after afib with RVR, now on telemetry unit. PMH: afib, HTN, GAD, depression, DM2, HL:D, OSA. Patient lives in single family home with grandson, who assists with transportation as needed. The patient performs household AMB using furniture for stability at [place]. She has a SPC, but does not use.     PT Comments    Pt agreeable to PT; no voiced complaints other than feeling weaker than her baseline. Pt able to perform transfers to/from recliner and commode with Mod I. Ambulation with supervision and decreased use of upper extremities for support other than when maneuvering about obstacles for safety. Ambulation subjectively slower than baseline. Pt participated in stand lower extremity/core exercises with cueing for proper technique. Continue PT to progress strength, endurance and balance to improve all functional mobility. Update goals for ambulation and stand balance/strength.    Follow Up Recommendations  Home health PT     Equipment Recommendations  None recommended by PT    Recommendations for Other Services       Precautions / Restrictions Precautions Precautions: Fall Restrictions Weight Bearing Restrictions: No    Mobility  Bed Mobility               General bed mobility comments: not tested; up in chair  Transfers Overall transfer level: Independent Equipment used: None Transfers: Sit to/from Stand Sit to Stand: Independent         General transfer comment: Safe use of hands; no loss of balance  Ambulation/Gait Ambulation/Gait assistance: Supervision Ambulation Distance (Feet): 30 Feet (To/from bathroom 30 x 2) Assistive device: None Gait Pattern/deviations: WFL(Within Functional Limits) Gait  velocity: reduced Gait velocity interpretation: <1.8 ft/sec, indicative of risk for recurrent falls General Gait Details: only minimal use of UE when maneuvering around obstacle   Stairs            Wheelchair Mobility    Modified Rankin (Stroke Patients Only)       Balance Overall balance assessment: Modified Independent                                          Cognition Arousal/Alertness: Awake/alert Behavior During Therapy: WFL for tasks assessed/performed Overall Cognitive Status: Within Functional Limits for tasks assessed                                        Exercises General Exercises - Lower Extremity Hip ABduction/ADduction: Strengthening;Both;10 reps;Standing Straight Leg Raises: Strengthening;Both;10 reps;Standing Hip Flexion/Marching: Strengthening;Both;20 reps;Standing Other Exercises Other Exercises: B hip ext and knee flexion in stand 10x  each    General Comments        Pertinent Vitals/Pain Pain Assessment: No/denies pain    Home Living                      Prior Function            PT Goals (current goals can now be found in the care plan section) Progress towards PT goals: Progressing toward goals    Frequency    Min 2X/week  PT Plan Current plan remains appropriate    Co-evaluation              AM-PAC PT "6 Clicks" Daily Activity  Outcome Measure  Difficulty turning over in bed (including adjusting bedclothes, sheets and blankets)?: None Difficulty moving from lying on back to sitting on the side of the bed? : None Difficulty sitting down on and standing up from a chair with arms (e.g., wheelchair, bedside commode, etc,.)?: None Help needed moving to and from a bed to chair (including a wheelchair)?: None Help needed walking in hospital room?: A Little Help needed climbing 3-5 steps with a railing? : A Little 6 Click Score: 22    End of Session Equipment Utilized  During Treatment: Gait belt;Oxygen Activity Tolerance: Patient tolerated treatment well Patient left: in chair;with chair alarm set;with call bell/phone within reach   PT Visit Diagnosis: Unsteadiness on feet (R26.81);Muscle weakness (generalized) (M62.81);Difficulty in walking, not elsewhere classified (R26.2)     Time: 9774-1423 PT Time Calculation (min) (ACUTE ONLY): 23 min  Charges:  $Gait Training: 8-22 mins $Therapeutic Exercise: 8-22 mins                    G Codes:        Larae Grooms, PTA 08/13/2016, 12:41 PM

## 2016-08-13 NOTE — Progress Notes (Signed)
ANTICOAGULATION CONSULT NOTE - Initial Consult  Pharmacy Consult for warfarin dosing Indication: atrial fibrillation  Allergies  Allergen Reactions  . Ampicillin   . Penicillins   . Prinzide  [Lisinopril-Hydrochlorothiazide]     Patient Measurements: Height: 5\' 1"  (154.9 cm) Weight: 202 lb 14.4 oz (92 kg) IBW/kg (Calculated) : 47.8  Vital Signs: Temp: 98.1 F (36.7 C) (05/23 0405) Temp Source: Oral (05/23 0405) BP: 108/68 (05/23 0405) Pulse Rate: 80 (05/23 0405)  Labs:  Recent Labs  08/11/16 0542 08/12/16 0614 08/13/16 0626  HGB  --   --  11.7*  HCT  --   --  34.7*  PLT  --   --  177  LABPROT 31.3* 29.9* 29.0*  INR 2.94 2.78 2.67  CREATININE  --  0.64 0.67    Estimated Creatinine Clearance: 61.9 mL/min (by C-G formula based on SCr of 0.67 mg/dL).   Medical History: Past Medical History:  Diagnosis Date  . A-fib ([place])   . Hypertension     Medications:  Pharmacy consulted to dose and monitor warfarin in this 76 year old female who was taking warfarin prior to admission for a history of atrial fibrillation.   INR of 2.23 was therapeutic on admission Last dose PTA was on 5/13  Home regimen:   Warfarin 2 mg and 2.5 mg taken on alternating days  Dosing history: Date INR Dose 5/14 2.23 2.5 mg dose given early AM on 5/15 during admission 5/15 2.18 2 mg 5/16 3.53 HELD 5/17 3.87 HELD 5/18 2.92 2 mg 5/19 2.56 2 mg 5/20 2.57 2 mg 5/21     2.94 Dose not given  5/22  2.78 2mg   5/22 2.67    Goal of Therapy:  INR 2-3  Plan:  INR therapeutic. Will order warfarin 2mg  tonight. INR ordered with AM labs.   Pernell Dupre, PharmD, BCPS Clinical Pharmacist 08/13/2016 7:23 AM

## 2016-08-13 NOTE — Progress Notes (Signed)
[place] [facility] [place] NAME: Hannah Faulkner    MR#:  [id number]  DATE OF BIRTH:  1940/08/22  SUBJECTIVE:  Pt  feels better today. Less shortness of breath.  CHIEF COMPLAINT:   Chief Complaint  Patient presents with  . Weakness  . Shortness of Breath   SOB better. I/Os not accurate as she uses the bathroom. Need to check weight today. Feels better Still on 2 L o2   Review of Systems  Constitutional: Positive for malaise/fatigue. Negative for chills, fever and weight loss.  HENT: Negative for congestion.   Eyes: Negative for blurred vision and double vision.  Respiratory: Positive for shortness of breath. Negative for sputum production and wheezing.   Cardiovascular: Negative for chest pain, palpitations, orthopnea, leg swelling and PND.  Gastrointestinal: Negative for abdominal pain, blood in stool, constipation, nausea and vomiting.  Genitourinary: Negative for dysuria, frequency, hematuria and urgency.  Musculoskeletal: Negative for falls.  Neurological: Positive for weakness. Negative for dizziness, tremors, focal weakness and headaches.  Endo/Heme/Allergies: Does not bruise/bleed easily.  Psychiatric/Behavioral: Negative for depression. The patient does not have insomnia.     VITAL SIGNS: Blood pressure 105/77, pulse 81, temperature 98.3 F (36.8 C), temperature source Oral, resp. rate 17, height 5\' 1"  (1.549 m), weight 92 kg (202 lb 14.4 oz), SpO2 94 %.  PHYSICAL EXAMINATION:   GENERAL:  76 y.o.-year-old patient lying in the bed in no distress.  EYES: Pupils equal, round, reactive to light and accommodation. No scleral icterus. Extraocular muscles intact.  HEENT: Head atraumatic, normocephalic. Oropharynx and nasopharynx clear.  NECK:  Supple, jugular venous distention. No thyroid enlargement, no tenderness.  LUNGS: Diminished breath sounds bilaterally, no wheezing,    Not using accessory muscles of respiration .    CARDIOVASCULAR: S1, S2 irregularly irregular, heart rate is better controlled. No murmurs, rubs, or gallops.  ABDOMEN: Soft, nontender, nondistended. Bowel sounds present. No organomegaly or mass.  EXTREMITIES: Trace lower extremity and pedal edema, no cyanosis, or clubbing.  NEUROLOGIC: Cranial nerves II through XII are intact. Muscle strength 5/5 in all extremities. Sensation intact. Gait not checked.  PSYCHIATRIC: The patient is alert and oriented x 3.  SKIN: No obvious rash, lesion, or ulcer.   ORDERS/RESULTS REVIEWED:   CBC  Recent Labs Lab 08/06/16 1131 08/07/16 0600 08/08/16 0457 08/13/16 0626  WBC 10.5 8.4 9.0 8.3  HGB 12.7 11.2* 11.7* 11.7*  HCT 38.1 33.5* 35.1 34.7*  PLT 222 182 187 177  MCV 84.3 85.1 84.7 85.4  MCH 28.0 28.5 28.1 28.7  MCHC 33.3 33.5 33.2 33.7  RDW 15.6* 15.2* 15.4* 15.0*  LYMPHSABS 1.6  --   --   --   MONOABS 0.9  --   --   --   EOSABS 0.1  --   --   --   BASOSABS 0.1  --   --   --    ------------------------------------------------------------------------------------------------------------------  Chemistries   Recent Labs Lab 08/06/16 1131 08/07/16 0600 08/08/16 0457 08/12/16 0614 08/13/16 0626  NA 142 144 143 142 141  K 3.6 3.2* 3.6 3.5 2.8*  CL 107 107 106 104 97*  CO2 27 30 30  34* 37*  GLUCOSE 138* 110* 119* 93 112*  BUN 19 23* 24* 25* 20  CREATININE 0.71 0.67 0.61 0.64 0.67  CALCIUM 8.8* 8.2* 8.5* 8.6* 8.7*  MG  --  2.1 2.2  --  2.0   ------------------------------------------------------------------------------------------------------------------ estimated creatinine clearance is 61.9 mL/min (by C-G  formula based on SCr of 0.67 mg/dL). ------------------------------------------------------------------------------------------------------------------ No results for input(s): TSH, T4TOTAL, T3FREE, THYROIDAB in the last 72 hours.  Invalid input(s): FREET3  Cardiac Enzymes No results for input(s): CKMB, TROPONINI, MYOGLOBIN  in the last 168 hours.  Invalid input(s): CK ------------------------------------------------------------------------------------------------------------------ Invalid input(s): POCBNP ---------------------------------------------------------------------------------------------------------------  RADIOLOGY: Dg Chest 2 View  Result Date: 08/12/2016 CLINICAL DATA:  CHF. EXAM: CHEST  2 VIEW COMPARISON:  CT 08/06/2016.  Chest x-ray 08/04/2016 . FINDINGS: Cardiomegaly with pulmonary vascular prominence and bilateral interstitial prominence with small bilateral pleural effusions. Lung volumes with prominent basilar atelectasis. Bibasilar pneumonia cannot be excluded . No pneumothorax. IMPRESSION: 1. Cardiomegaly with pulmonary vascular prominence and bilateral interstitial prominence and bilateral pleural effusions. Findings consistent CHF. 2. Low lung volumes with prominent bibasilar atelectasis. Bibasilar pneumonia cannot be excluded . Electronically Signed   By: Marcello Moores  Register   On: 08/12/2016 11:38    EKG:  Orders placed or performed during the hospital encounter of 08/04/16  . EKG 12-Lead  . EKG 12-Lead  . ED EKG within 10 minutes  . ED EKG within 10 minutes  . EKG 12-Lead  . EKG 12-Lead  . EKG 12-Lead  . EKG 12-Lead   ASSESSMENT AND PLAN:  Active Problems:   Community acquired pneumonia   # Acute respiratory failure with hypoxia due to acute on chronic diastolic chf - POA  wean off O2 as tolerated Repeat chest x-ray today continues to show pulmonary edema with bilateral pleural effusions. Lasix dose increased yesterday. We will add metolazone. Patient was seen by pulmonologist, who did not feel that she had pneumonia,  now off antibiotic therapy,  stable clinically, no fevers, normal white blood cell count. May need O2 at discharge  Improving with Diuresis. Will need one more day of inpatient diuresis. Will give another dose of Metolazone with lasix today.   # A. fib, RVR, now  off Cardizem IV drip, continue CardizemCD, Metoprolol, Flecainide, Troponin was negative.  Coumadin dosing as per pharmacy. Echocardiogram revealed normal ejection fraction, mild aortic stenosis, mild mitral regurgitation  # Hypokalemia Due to diuresis Replace PO  # Essential hypertension  continue Cardizem and metoprolol  # Diarrhea - Resolved negative for C. difficile, gastrointestinal panel is negative  DRUG ALLERGIES:  Allergies  Allergen Reactions  . Ampicillin   . Penicillins   . Prinzide  [Lisinopril-Hydrochlorothiazide]     CODE STATUS:     Code Status Orders        Start     Ordered   08/04/16 2222  Full code  Continuous     08/04/16 2221    Code Status History    Date Active Date Inactive Code Status Order ID Comments User Context   02/29/2016  8:53 PM 03/03/2016  2:03 PM Full Code [id number]  Henreitta Leber, MD Inpatient      TOTAL TIME TAKING CARE OF THIS PATIENT: 35 minutes  Discharge home tomorrow with lasix daily. Needs HH.  Hillary Bow R M.D on 08/13/2016 at 10:07 AM  Between 7am to 6pm - Pager - [phone]  After 6pm go to [email or web address] - password [email or web address] [facility]  [facility]  Office  [phone]  CC: Primary care physician; Jerrol Banana., MD

## 2016-08-14 ENCOUNTER — Telehealth: Payer: Self-pay

## 2016-08-14 ENCOUNTER — Other Ambulatory Visit: Payer: Self-pay | Admitting: Internal Medicine

## 2016-08-14 ENCOUNTER — Telehealth: Payer: Self-pay | Admitting: Internal Medicine

## 2016-08-14 DIAGNOSIS — J189 Pneumonia, unspecified organism: Secondary | ICD-10-CM

## 2016-08-14 LAB — GLUCOSE, CAPILLARY
Glucose-Capillary: 116 mg/dL — ABNORMAL HIGH (ref 65–99)
Glucose-Capillary: 94 mg/dL (ref 65–99)

## 2016-08-14 LAB — BASIC METABOLIC PANEL
Anion gap: 9 (ref 5–15)
BUN: 19 mg/dL (ref 6–20)
CO2: 38 mmol/L — ABNORMAL HIGH (ref 22–32)
CREATININE: 0.7 mg/dL (ref 0.44–1.00)
Calcium: 8.6 mg/dL — ABNORMAL LOW (ref 8.9–10.3)
Chloride: 94 mmol/L — ABNORMAL LOW (ref 101–111)
GFR calc non Af Amer: >60 mL/min (ref >60)
GLUCOSE: 110 mg/dL — AB (ref 65–99)
Potassium: 2.6 mmol/L — CL (ref 3.5–5.1)
Sodium: 141 mmol/L (ref 135–145)

## 2016-08-14 LAB — PROTIME-INR
INR: 2.59
Prothrombin Time: 28.3 seconds — ABNORMAL HIGH (ref 11.4–15.2)

## 2016-08-14 LAB — POTASSIUM: POTASSIUM: 3.2 mmol/L — AB (ref 3.5–5.1)

## 2016-08-14 MED ORDER — POTASSIUM CHLORIDE CRYS ER 20 MEQ PO TBCR: 20.0000 meq | EXTENDED_RELEASE_TABLET | Freq: Every day | ORAL | 0 refills | Status: DC

## 2016-08-14 MED ORDER — POTASSIUM CHLORIDE 10 MEQ/100ML IV SOLN
10.0000 meq | INTRAVENOUS | Status: AC
  Administered 2016-08-14 (×4): 10 meq via INTRAVENOUS
  Filled 2016-08-14 (×4): qty 100

## 2016-08-14 MED ORDER — FUROSEMIDE 40 MG PO TABS: 40.0000 mg | ORAL_TABLET | Freq: Every day | ORAL | 0 refills | Status: DC

## 2016-08-14 MED ORDER — POTASSIUM CHLORIDE CRYS ER 20 MEQ PO TBCR
40.0000 meq | EXTENDED_RELEASE_TABLET | Freq: Once | ORAL | Status: AC
  Administered 2016-08-14: 40 meq via ORAL
  Filled 2016-08-14: qty 2

## 2016-08-14 MED ORDER — FUROSEMIDE 40 MG PO TABS: 40.0000 mg | ORAL_TABLET | Freq: Every day | ORAL | Status: DC

## 2016-08-14 NOTE — Telephone Encounter (Signed)
Can you do TIC call? Hannah Faulkner, CMA

## 2016-08-14 NOTE — Progress Notes (Signed)
Notified MD of K+ 2.6. Awaiting orders. Will continue to monitor and assess.

## 2016-08-14 NOTE — Progress Notes (Signed)
PHARMACY CONSULT NOTE - INITIAL   Pharmacy Consult for hypokalemia   Allergies  Allergen Reactions  . Ampicillin   . Penicillins   . Prinzide  [Lisinopril-Hydrochlorothiazide]     Patient Measurements: Height: 5\' 1"  (154.9 cm) Weight: 192 lb 6.4 oz (87.3 kg) IBW/kg (Calculated) : 47.8  Intake/Output from previous day: 05/23 0701 - 05/24 0700 In: 480 [P.O.:480] Out: 1300 [Urine:1300] Intake/Output from this shift: Total I/O In: 240 [P.O.:240] Out: 700 [Urine:700]  Labs:  Recent Labs  08/12/16 0614 08/13/16 0626 08/14/16 0436  WBC  --  8.3  --   HGB  --  11.7*  --   HCT  --  34.7*  --   PLT  --  177  --   CREATININE 0.64 0.67 0.70  MG  --  2.0  --    Estimated Creatinine Clearance: 60.1 mL/min (by C-G formula based on SCr of 0.7 mg/dL).  Medical History: Past Medical History:  Diagnosis Date  . A-fib ([place])   . Hypertension    Assessment: 76 yo female with hypokalemia. Pharmacy consulted for potassium replacement.    Goal of Therapy:  K: 3.5-5  Plan:  1840 1159 K+: 3.2   Will replace with KCL 40mEq x 1 dose.   Pernell Dupre, PharmD, BCPS Clinical Pharmacist 08/14/2016 12:47 PM

## 2016-08-14 NOTE — Discharge Summary (Signed)
[place] [facility] [place] NAME: Hannah Faulkner    MR#:  [id number]  DATE OF BIRTH:  19-Jul-1940  DATE OF ADMISSION:  08/04/2016 ADMITTING PHYSICIAN: Harvie Bridge, DO  DATE OF DISCHARGE: 08/14/16  PRIMARY CARE PHYSICIAN: Jerrol Banana., MD    ADMISSION DIAGNOSIS:  Community acquired pneumonia, unspecified laterality [J18.9]  DISCHARGE DIAGNOSIS:  *Acute respiratory failure with hypoxia due to acute on chronic diastolic chf * A. fib, RVR * Hypokalemia due to diuresis *HTN  SECONDARY DIAGNOSIS:   Past Medical History:  Diagnosis Date  . A-fib ([place])   . Hypertension     HOSPITAL COURSE:  # Acute respiratory failure with hypoxia due to acute on chronic diastolic chf - POA  wean off O2 as tolerated Repeat chest x-ray today continues to show pulmonary edema with bilateral pleural effusions. Lasix dose increased yesterday. Received 1 dose of metolazone.---UOP 2000 cc over 24 hours. Feels a lot better -weight  202 lbs-----192lbs Patient was seen by pulmonologist, who did not feel that she had pneumonia,  now off antibiotic therapy,  stable clinically, no fevers, normal white blood cell count. May need O2 at discharge. Po lasix 40 mg daily with KCL   # A. fib, RVR, now off Cardizem IV drip Continue CardizemCD, Metoprolol, Flecainide Troponin was negative.  Coumadin dosing as per pharmacy. Echocardiogram revealed normal ejection fraction, mild aortic stenosis, mild mitral regurgitation  # Hypokalemia Due to diuresis Replace PO  # Essential hypertension  continue Cardizem and metoprolol  # Diarrhea - Resolved negative for C. difficile, gastrointestinal panel is negative  # PT recommend HHPT/OT  D/c later today after K is replaced Pt agreeable  CONSULTS OBTAINED:  Treatment Team:  Isaias Cowman, MD Corey Skains, MD Epifanio Lesches, MD  DRUG ALLERGIES:   Allergies  Allergen Reactions  .  Ampicillin   . Penicillins   . Prinzide  [Lisinopril-Hydrochlorothiazide]     DISCHARGE MEDICATIONS:   Current Discharge Medication List    START taking these medications   Details  furosemide (LASIX) 40 MG tablet Take 1 tablet (40 mg total) by mouth daily. Qty: 30 tablet, Refills: 0    potassium chloride SA (K-DUR,KLOR-CON) 20 MEQ tablet Take 1 tablet (20 mEq total) by mouth daily. Qty: 30 tablet, Refills: 0      CONTINUE these medications which have NOT CHANGED   Details  ALPRAZolam (XANAX) 0.5 MG tablet TAKE 1 TABLET BY MOUTH AT BEDTIME AS NEEDED FOR ANXIETY Qty: 30 tablet, Refills: 5    cyanocobalamin 1000 MCG tablet Take 1,000 mcg by mouth daily.    diltiazem (CARDIZEM) 120 MG tablet Take 120 mg by mouth 2 times daily at 12 noon and 4 pm.     gabapentin (NEURONTIN) 100 MG capsule Take 2 capsules (200 mg total) by mouth 3 (three) times daily. Qty: 540 capsule, Refills: 3   Associated Diagnoses: Diabetic polyneuropathy associated with diabetes mellitus due to underlying condition (HCC)    hydrALAZINE (APRESOLINE) 25 MG tablet TAKE 1 TABLET BY MOUTH 3 TIMES A DAY Qty: 90 tablet, Refills: 3    levofloxacin (LEVAQUIN) 500 MG tablet Take 1 tablet (500 mg total) by mouth daily. Qty: 5 tablet, Refills: 0    metoprolol (LOPRESSOR) 50 MG tablet Take 50 mg by mouth 2 (two) times daily.     potassium chloride (K-DUR) 10 MEQ tablet Take 10 mEq by mouth daily.     PROAIR HFA 108 (90 Base) MCG/ACT inhaler INHALE  2 INHALATIONS INTO THE LUNGS EVERY 6 (SIX) HOURS AS NEEDED FOR WHEEZING. Refills: 4    Pyridoxine HCl (VITAMIN B6) 200 MG TABS Take by mouth.    !! warfarin (COUMADIN) 2 MG tablet Take 2 mg by mouth every other day. Take 2mg  one day and take 2.5mg  next day, alternating.    !! warfarin (COUMADIN) 2.5 MG tablet Take 2.5 mg by mouth every other day. Take 2mg  one day and take 2.5mg  next day, alternating.    flecainide (TAMBOCOR) 50 MG tablet Take 50 mg by mouth daily.       !! - Potential duplicate medications found. Please discuss with provider.    STOP taking these medications     triamterene-hydrochlorothiazide (MAXZIDE-25) 37.5-25 MG tablet         If you experience worsening of your admission symptoms, develop shortness of breath, life threatening emergency, suicidal or homicidal thoughts you must seek medical attention immediately by calling 911 or calling your MD immediately  if symptoms less severe.  You Must read complete instructions/literature along with all the possible adverse reactions/side effects for all the Medicines you take and that have been prescribed to you. Take any new Medicines after you have completely understood and accept all the possible adverse reactions/side effects.   Please note  You were cared for by a hospitalist during your hospital stay. If you have any questions about your discharge medications or the care you received while you were in the hospital after you are discharged, you can call the unit and asked to speak with the hospitalist on call if the hospitalist that took care of you is not available. Once you are discharged, your primary care physician will handle any further medical issues. Please note that NO REFILLS for any discharge medications will be authorized once you are discharged, as it is imperative that you return to your primary care physician (or establish a relationship with a primary care physician if you do not have one) for your aftercare needs so that they can reassess your need for medications and monitor your lab values. Today   SUBJECTIVE   Doing well  VITAL SIGNS:  Blood pressure 105/62, pulse (!) 52, temperature 98.6 F (37 C), temperature source Oral, resp. rate 16, height 5\' 1"  (1.549 m), weight 87.3 kg (192 lb 6.4 oz), SpO2 94 %.  I/O:   Intake/Output Summary (Last 24 hours) at 08/14/16 1112 Last data filed at 08/14/16 1026  Gross per 24 hour  Intake              720 ml  Output              2000 ml  Net            -1280 ml    PHYSICAL EXAMINATION:  GENERAL:  76 y.o.-year-old patient lying in the bed with no acute distress.  EYES: Pupils equal, round, reactive to light and accommodation. No scleral icterus. Extraocular muscles intact.  HEENT: Head atraumatic, normocephalic. Oropharynx and nasopharynx clear.  NECK:  Supple, no jugular venous distention. No thyroid enlargement, no tenderness.  LUNGS: Normal breath sounds bilaterally, no wheezing, very few rales,no rhonchi or crepitation. No use of accessory muscles of respiration.  CARDIOVASCULAR: S1, S2 normal. No murmurs, rubs, or gallops.  ABDOMEN: Soft, non-tender, non-distended. Bowel sounds present. No organomegaly or mass.  EXTREMITIES: No pedal edema, cyanosis, or clubbing.  NEUROLOGIC: Cranial nerves II through XII are intact. Muscle strength 5/5 in all extremities. Sensation intact. Gait  not checked.  PSYCHIATRIC: The patient is alert and oriented x 3.  SKIN: No obvious rash, lesion, or ulcer.   DATA REVIEW:   CBC   Recent Labs Lab 08/13/16 0626  WBC 8.3  HGB 11.7*  HCT 34.7*  PLT 177    Chemistries   Recent Labs Lab 08/13/16 0626 08/14/16 0436  NA 141 141  K 2.8* 2.6*  CL 97* 94*  CO2 37* 38*  GLUCOSE 112* 110*  BUN 20 19  CREATININE 0.67 0.70  CALCIUM 8.7* 8.6*  MG 2.0  --     Microbiology Results   Recent Results (from the past 240 hour(s))  Blood culture (routine x 2)     Status: None   Collection Time: 08/04/16  9:02 PM  Result Value Ref Range Status   Specimen Description BLOOD R AC  Final   Special Requests Blood Culture adequate volume  Final   Culture NO GROWTH 5 DAYS  Final   Report Status 08/09/2016 FINAL  Final  Blood culture (routine x 2)     Status: None   Collection Time: 08/04/16  9:02 PM  Result Value Ref Range Status   Specimen Description BLOOD L AC  Final   Special Requests Blood Culture adequate volume  Final   Culture NO GROWTH 5 DAYS  Final   Report Status  08/09/2016 FINAL  Final  MRSA PCR Screening     Status: None   Collection Time: 08/05/16 10:15 AM  Result Value Ref Range Status   MRSA by PCR NEGATIVE NEGATIVE Final    Comment:        The GeneXpert MRSA Assay (FDA approved for NASAL specimens only), is one component of a comprehensive MRSA colonization surveillance program. It is not intended to diagnose MRSA infection nor to guide or monitor treatment for MRSA infections.   C difficile quick scan w PCR reflex     Status: None   Collection Time: 08/05/16  6:00 PM  Result Value Ref Range Status   C Diff antigen NEGATIVE NEGATIVE Final   C Diff toxin NEGATIVE NEGATIVE Final   C Diff interpretation No C. difficile detected.  Final  Gastrointestinal Panel by PCR , Stool     Status: None   Collection Time: 08/05/16  6:00 PM  Result Value Ref Range Status   Campylobacter species NOT DETECTED NOT DETECTED Final   Plesimonas shigelloides NOT DETECTED NOT DETECTED Final   Salmonella species NOT DETECTED NOT DETECTED Final   Yersinia enterocolitica NOT DETECTED NOT DETECTED Final   Vibrio species NOT DETECTED NOT DETECTED Final   Vibrio cholerae NOT DETECTED NOT DETECTED Final   Enteroaggregative E coli (EAEC) NOT DETECTED NOT DETECTED Final   Enteropathogenic E coli (EPEC) NOT DETECTED NOT DETECTED Final   Enterotoxigenic E coli (ETEC) NOT DETECTED NOT DETECTED Final   Shiga like toxin producing E coli (STEC) NOT DETECTED NOT DETECTED Final   Shigella/Enteroinvasive E coli (EIEC) NOT DETECTED NOT DETECTED Final   Cryptosporidium NOT DETECTED NOT DETECTED Final   Cyclospora cayetanensis NOT DETECTED NOT DETECTED Final   Entamoeba histolytica NOT DETECTED NOT DETECTED Final   Giardia lamblia NOT DETECTED NOT DETECTED Final   Adenovirus F40/41 NOT DETECTED NOT DETECTED Final   Astrovirus NOT DETECTED NOT DETECTED Final   Norovirus GI/GII NOT DETECTED NOT DETECTED Final   Rotavirus A NOT DETECTED NOT DETECTED Final   Sapovirus (I,  II, IV, and V) NOT DETECTED NOT DETECTED Final  Culture, sputum-assessment  Status: None   Collection Time: 08/07/16 10:17 AM  Result Value Ref Range Status   Specimen Description SPUTUM  Final   Special Requests Normal  Final   Sputum evaluation   Final    Sputum specimen not acceptable for testing.  Please recollect.   CALLED TO [place] ON 08/07/16 AT 1212 ALV    Report Status 08/07/2016 FINAL  Final    RADIOLOGY:  Dg Chest 2 View  Result Date: 08/12/2016 CLINICAL DATA:  CHF. EXAM: CHEST  2 VIEW COMPARISON:  CT 08/06/2016.  Chest x-ray 08/04/2016 . FINDINGS: Cardiomegaly with pulmonary vascular prominence and bilateral interstitial prominence with small bilateral pleural effusions. Lung volumes with prominent basilar atelectasis. Bibasilar pneumonia cannot be excluded . No pneumothorax. IMPRESSION: 1. Cardiomegaly with pulmonary vascular prominence and bilateral interstitial prominence and bilateral pleural effusions. Findings consistent CHF. 2. Low lung volumes with prominent bibasilar atelectasis. Bibasilar pneumonia cannot be excluded . Electronically Signed   By: Marcello Moores  Register   On: 08/12/2016 11:38     Management plans discussed with the patient, family and they are in agreement.  CODE STATUS:     Code Status Orders        Start     Ordered   08/04/16 2222  Full code  Continuous     08/04/16 2221    Code Status History    Date Active Date Inactive Code Status Order ID Comments User Context   02/29/2016  8:53 PM 03/03/2016  2:03 PM Full Code [id number]  Henreitta Leber, MD Inpatient      TOTAL TIME TAKING CARE OF THIS PATIENT: 40 minutes.    Lavone Weisel M.D on 08/14/2016 at 11:12 AM  Between 7am to 6pm - Pager - [phone] After 6pm go to [email or web address] - password [email or web address] [facility]  Office  [phone]  CC: Primary care physician; Jerrol Banana., MD

## 2016-08-14 NOTE — Telephone Encounter (Signed)
Patient is being discharged today from [facility] pt was admitted on 08/04/16 and treated for CHF. Patient needs a follow up in one week. Please advise when patient can come in or if can be seen a a different provider. Patient's CB# [phone]. sd

## 2016-08-14 NOTE — Progress Notes (Signed)
ANTICOAGULATION CONSULT NOTE - Initial Consult  Pharmacy Consult for warfarin dosing Indication: atrial fibrillation  Allergies  Allergen Reactions  . Ampicillin   . Penicillins   . Prinzide  [Lisinopril-Hydrochlorothiazide]     Patient Measurements: Height: 5\' 1"  (154.9 cm) Weight: 192 lb 6.4 oz (87.3 kg) IBW/kg (Calculated) : 47.8  Vital Signs: Temp: 98.6 F (37 C) (05/24 0453) Temp Source: Oral (05/24 0453) BP: 105/62 (05/24 0453) Pulse Rate: 52 (05/24 0453)  Labs:  Recent Labs  08/12/16 0614 08/13/16 0626 08/14/16 0436  HGB  --  11.7*  --   HCT  --  34.7*  --   PLT  --  177  --   LABPROT 29.9* 29.0* 28.3*  INR 2.78 2.67 2.59  CREATININE 0.64 0.67 0.70    Estimated Creatinine Clearance: 60.1 mL/min (by C-G formula based on SCr of 0.7 mg/dL).   Medical History: Past Medical History:  Diagnosis Date  . A-fib ([place])   . Hypertension     Medications:  Pharmacy consulted to dose and monitor warfarin in this 76 year old female who was taking warfarin prior to admission for a history of atrial fibrillation.   INR of 2.23 was therapeutic on admission Last dose PTA was on 5/13  Home regimen:   Warfarin 2 mg and 2.5 mg taken on alternating days  Dosing history: Date INR Dose 5/14 2.23 2.5 mg dose given early AM on 5/15 during admission 5/15 2.18 2 mg 5/16 3.53 HELD 5/17 3.87 HELD 5/18 2.92 2 mg 5/19 2.56 2 mg 5/20 2.57 2 mg 5/21     2.94 Dose not given  5/22  2.78 2mg   5/23 2.67 2mg  5/24 2.59   Goal of Therapy:  INR 2-3  Plan:  INR therapeutic. Will continue warfarin 2mg  daily.   Pernell Dupre, PharmD, BCPS Clinical Pharmacist 08/14/2016 10:57 AM

## 2016-08-14 NOTE — Telephone Encounter (Signed)
She was D/C 08/14/2016. Thanks. Renaldo Fiddler, CMA

## 2016-08-14 NOTE — Telephone Encounter (Signed)
Do you know when the patient was discharged? I am seeing 08/07/16. I do not do TCM calls after 48 hours of being discharged. If not, let me know when and if within that time frame, I would be happy to do so! Thanks, MM

## 2016-08-14 NOTE — Progress Notes (Signed)
SATURATION QUALIFICATIONS: (This note is used to comply with regulatory documentation for home oxygen)  Patient Saturations on Room Air at Rest = 88%  Patient Saturations on Room Air while Ambulating = n/a%  Patient Saturations on 1 Liters of oxygen while resting = 93%  Please briefly explain why patient needs home oxygen:

## 2016-08-14 NOTE — Telephone Encounter (Signed)
Needs hosp f/u for 2 weeks.

## 2016-08-14 NOTE — Telephone Encounter (Signed)
Please schedule 2-4 week f/u chest xray prior, 1st available provider.

## 2016-08-14 NOTE — Care Management (Signed)
Patient has qualified for home oxygen.  IV lasix, nebs, antibiotics have not resolved patient hypoxia thus requiring for her to be sent home on oxygen.  Home health referral  for SN PT Aide OT has been called to [facility] and accepted.Patient informed.  [facility] to provide oxygen

## 2016-08-15 ENCOUNTER — Telehealth: Payer: Self-pay | Admitting: Family Medicine

## 2016-08-15 DIAGNOSIS — J189 Pneumonia, unspecified organism: Secondary | ICD-10-CM | POA: Diagnosis not present

## 2016-08-15 DIAGNOSIS — I5032 Chronic diastolic (congestive) heart failure: Secondary | ICD-10-CM | POA: Diagnosis not present

## 2016-08-15 DIAGNOSIS — R0602 Shortness of breath: Secondary | ICD-10-CM | POA: Diagnosis not present

## 2016-08-15 DIAGNOSIS — Z7901 Long term (current) use of anticoagulants: Secondary | ICD-10-CM | POA: Diagnosis not present

## 2016-08-15 DIAGNOSIS — I110 Hypertensive heart disease with heart failure: Secondary | ICD-10-CM | POA: Diagnosis not present

## 2016-08-15 DIAGNOSIS — I4891 Unspecified atrial fibrillation: Secondary | ICD-10-CM | POA: Diagnosis not present

## 2016-08-15 DIAGNOSIS — Z9981 Dependence on supplemental oxygen: Secondary | ICD-10-CM | POA: Diagnosis not present

## 2016-08-15 DIAGNOSIS — I5033 Acute on chronic diastolic (congestive) heart failure: Secondary | ICD-10-CM | POA: Diagnosis not present

## 2016-08-15 NOTE — Telephone Encounter (Signed)
Judeen Hammans with [facility] called to get a verbal order for plan of care to see pt 3 times a week for 2 weeks and 2 times a week for 3 weeks.  SW#[phone]/YK

## 2016-08-15 NOTE — Telephone Encounter (Signed)
ok 

## 2016-08-15 NOTE — Telephone Encounter (Signed)
Please review-aa 

## 2016-08-19 DIAGNOSIS — Z7901 Long term (current) use of anticoagulants: Secondary | ICD-10-CM | POA: Diagnosis not present

## 2016-08-19 DIAGNOSIS — I4891 Unspecified atrial fibrillation: Secondary | ICD-10-CM | POA: Diagnosis not present

## 2016-08-19 DIAGNOSIS — I110 Hypertensive heart disease with heart failure: Secondary | ICD-10-CM | POA: Diagnosis not present

## 2016-08-19 DIAGNOSIS — I5033 Acute on chronic diastolic (congestive) heart failure: Secondary | ICD-10-CM | POA: Diagnosis not present

## 2016-08-19 DIAGNOSIS — Z9981 Dependence on supplemental oxygen: Secondary | ICD-10-CM | POA: Diagnosis not present

## 2016-08-19 NOTE — Telephone Encounter (Signed)
Received returned message after I was gone for the weekend. Pt is past 48 hour mark for TCM call.  Also, pt is following up with [facility] and Dr. Saralyn Pilar.

## 2016-08-20 ENCOUNTER — Telehealth: Payer: Self-pay

## 2016-08-20 NOTE — Telephone Encounter (Signed)
ok 

## 2016-08-20 NOTE — Telephone Encounter (Signed)
[name] with [facility] is requesting a verbal order for PT. 2 times a week for 4 weeks and 1 time a week for 1 week for balance and stability. CB# [phone].

## 2016-08-20 NOTE — Telephone Encounter (Signed)
Advised [name] as below.  

## 2016-08-20 NOTE — Telephone Encounter (Signed)
Please review. Thanks!  

## 2016-08-21 DIAGNOSIS — Z9981 Dependence on supplemental oxygen: Secondary | ICD-10-CM | POA: Diagnosis not present

## 2016-08-21 DIAGNOSIS — G4733 Obstructive sleep apnea (adult) (pediatric): Secondary | ICD-10-CM | POA: Diagnosis not present

## 2016-08-21 DIAGNOSIS — Z7901 Long term (current) use of anticoagulants: Secondary | ICD-10-CM | POA: Diagnosis not present

## 2016-08-21 DIAGNOSIS — I480 Paroxysmal atrial fibrillation: Secondary | ICD-10-CM | POA: Diagnosis not present

## 2016-08-21 DIAGNOSIS — G4730 Sleep apnea, unspecified: Secondary | ICD-10-CM | POA: Diagnosis not present

## 2016-08-21 DIAGNOSIS — I10 Essential (primary) hypertension: Secondary | ICD-10-CM | POA: Diagnosis not present

## 2016-08-21 DIAGNOSIS — I5033 Acute on chronic diastolic (congestive) heart failure: Secondary | ICD-10-CM | POA: Diagnosis not present

## 2016-08-21 DIAGNOSIS — I110 Hypertensive heart disease with heart failure: Secondary | ICD-10-CM | POA: Diagnosis not present

## 2016-08-21 DIAGNOSIS — I481 Persistent atrial fibrillation: Secondary | ICD-10-CM | POA: Diagnosis not present

## 2016-08-21 DIAGNOSIS — I4891 Unspecified atrial fibrillation: Secondary | ICD-10-CM | POA: Diagnosis not present

## 2016-08-22 DIAGNOSIS — I110 Hypertensive heart disease with heart failure: Secondary | ICD-10-CM | POA: Diagnosis not present

## 2016-08-22 DIAGNOSIS — I5033 Acute on chronic diastolic (congestive) heart failure: Secondary | ICD-10-CM | POA: Diagnosis not present

## 2016-08-22 DIAGNOSIS — Z9981 Dependence on supplemental oxygen: Secondary | ICD-10-CM | POA: Diagnosis not present

## 2016-08-22 DIAGNOSIS — I4891 Unspecified atrial fibrillation: Secondary | ICD-10-CM | POA: Diagnosis not present

## 2016-08-22 DIAGNOSIS — Z7901 Long term (current) use of anticoagulants: Secondary | ICD-10-CM | POA: Diagnosis not present

## 2016-08-25 DIAGNOSIS — I5033 Acute on chronic diastolic (congestive) heart failure: Secondary | ICD-10-CM | POA: Diagnosis not present

## 2016-08-25 DIAGNOSIS — I4891 Unspecified atrial fibrillation: Secondary | ICD-10-CM | POA: Diagnosis not present

## 2016-08-25 DIAGNOSIS — Z7901 Long term (current) use of anticoagulants: Secondary | ICD-10-CM | POA: Diagnosis not present

## 2016-08-25 DIAGNOSIS — Z9981 Dependence on supplemental oxygen: Secondary | ICD-10-CM | POA: Diagnosis not present

## 2016-08-25 DIAGNOSIS — I110 Hypertensive heart disease with heart failure: Secondary | ICD-10-CM | POA: Diagnosis not present

## 2016-08-26 ENCOUNTER — Inpatient Hospital Stay: Payer: Medicare Other

## 2016-08-26 ENCOUNTER — Inpatient Hospital Stay
Admission: EM | Admit: 2016-08-26 | Discharge: 2016-08-31 | DRG: 291 | Disposition: A | Discharge status: Home Health | Payer: Medicare Other | Attending: Internal Medicine | Admitting: Internal Medicine

## 2016-08-26 ENCOUNTER — Emergency Department: Payer: Medicare Other

## 2016-08-26 DIAGNOSIS — J9690 Respiratory failure, unspecified, unspecified whether with hypoxia or hypercapnia: Secondary | ICD-10-CM

## 2016-08-26 DIAGNOSIS — Z8249 Family history of ischemic heart disease and other diseases of the circulatory system: Secondary | ICD-10-CM | POA: Diagnosis not present

## 2016-08-26 DIAGNOSIS — J9621 Acute and chronic respiratory failure with hypoxia: Secondary | ICD-10-CM | POA: Diagnosis present

## 2016-08-26 DIAGNOSIS — Z833 Family history of diabetes mellitus: Secondary | ICD-10-CM | POA: Diagnosis not present

## 2016-08-26 DIAGNOSIS — R609 Edema, unspecified: Secondary | ICD-10-CM

## 2016-08-26 DIAGNOSIS — E1140 Type 2 diabetes mellitus with diabetic neuropathy, unspecified: Secondary | ICD-10-CM | POA: Diagnosis present

## 2016-08-26 DIAGNOSIS — J9601 Acute respiratory failure with hypoxia: Secondary | ICD-10-CM

## 2016-08-26 DIAGNOSIS — Z880 Allergy status to penicillin: Secondary | ICD-10-CM

## 2016-08-26 DIAGNOSIS — I509 Heart failure, unspecified: Secondary | ICD-10-CM | POA: Diagnosis not present

## 2016-08-26 DIAGNOSIS — N390 Urinary tract infection, site not specified: Secondary | ICD-10-CM | POA: Diagnosis not present

## 2016-08-26 DIAGNOSIS — R05 Cough: Secondary | ICD-10-CM

## 2016-08-26 DIAGNOSIS — E1165 Type 2 diabetes mellitus with hyperglycemia: Secondary | ICD-10-CM | POA: Diagnosis present

## 2016-08-26 DIAGNOSIS — R0602 Shortness of breath: Secondary | ICD-10-CM | POA: Diagnosis not present

## 2016-08-26 DIAGNOSIS — Z79899 Other long term (current) drug therapy: Secondary | ICD-10-CM | POA: Diagnosis not present

## 2016-08-26 DIAGNOSIS — Z8701 Personal history of pneumonia (recurrent): Secondary | ICD-10-CM

## 2016-08-26 DIAGNOSIS — I5033 Acute on chronic diastolic (congestive) heart failure: Secondary | ICD-10-CM | POA: Diagnosis present

## 2016-08-26 DIAGNOSIS — R739 Hyperglycemia, unspecified: Secondary | ICD-10-CM | POA: Diagnosis not present

## 2016-08-26 DIAGNOSIS — R059 Cough, unspecified: Secondary | ICD-10-CM

## 2016-08-26 DIAGNOSIS — G4733 Obstructive sleep apnea (adult) (pediatric): Secondary | ICD-10-CM | POA: Diagnosis not present

## 2016-08-26 DIAGNOSIS — F419 Anxiety disorder, unspecified: Secondary | ICD-10-CM | POA: Diagnosis present

## 2016-08-26 DIAGNOSIS — Z888 Allergy status to other drugs, medicaments and biological substances status: Secondary | ICD-10-CM | POA: Diagnosis not present

## 2016-08-26 DIAGNOSIS — J810 Acute pulmonary edema: Secondary | ICD-10-CM | POA: Diagnosis not present

## 2016-08-26 DIAGNOSIS — I5031 Acute diastolic (congestive) heart failure: Secondary | ICD-10-CM | POA: Diagnosis present

## 2016-08-26 DIAGNOSIS — I110 Hypertensive heart disease with heart failure: Secondary | ICD-10-CM | POA: Diagnosis not present

## 2016-08-26 DIAGNOSIS — E7800 Pure hypercholesterolemia, unspecified: Secondary | ICD-10-CM | POA: Diagnosis not present

## 2016-08-26 DIAGNOSIS — I10 Essential (primary) hypertension: Secondary | ICD-10-CM | POA: Diagnosis not present

## 2016-08-26 DIAGNOSIS — I480 Paroxysmal atrial fibrillation: Secondary | ICD-10-CM | POA: Diagnosis present

## 2016-08-26 DIAGNOSIS — I481 Persistent atrial fibrillation: Secondary | ICD-10-CM | POA: Diagnosis not present

## 2016-08-26 DIAGNOSIS — Z7901 Long term (current) use of anticoagulants: Secondary | ICD-10-CM | POA: Diagnosis not present

## 2016-08-26 DIAGNOSIS — R8281 Pyuria: Secondary | ICD-10-CM

## 2016-08-26 DIAGNOSIS — G4730 Sleep apnea, unspecified: Secondary | ICD-10-CM | POA: Diagnosis not present

## 2016-08-26 DIAGNOSIS — I482 Chronic atrial fibrillation: Secondary | ICD-10-CM | POA: Diagnosis present

## 2016-08-26 DIAGNOSIS — I4891 Unspecified atrial fibrillation: Secondary | ICD-10-CM | POA: Diagnosis not present

## 2016-08-26 HISTORY — DX: Heart failure, unspecified: I50.9

## 2016-08-26 LAB — BASIC METABOLIC PANEL
ANION GAP: 7 (ref 5–15)
BUN: 22 mg/dL — ABNORMAL HIGH (ref 6–20)
CALCIUM: 8.8 mg/dL — AB (ref 8.9–10.3)
CO2: 31 mmol/L (ref 22–32)
Chloride: 101 mmol/L (ref 101–111)
Creatinine, Ser: 0.76 mg/dL (ref 0.44–1.00)
GFR calc non Af Amer: >60 mL/min (ref >60)
GLUCOSE: 117 mg/dL — AB (ref 65–99)
POTASSIUM: 4.7 mmol/L (ref 3.5–5.1)
Sodium: 139 mmol/L (ref 135–145)

## 2016-08-26 LAB — CBC
HEMATOCRIT: 38 % (ref 35.0–47.0)
HEMOGLOBIN: 12.6 g/dL (ref 12.0–16.0)
MCH: 28.3 pg (ref 26.0–34.0)
MCHC: 33 g/dL (ref 32.0–36.0)
MCV: 85.8 fL (ref 80.0–100.0)
Platelets: 284 10*3/uL (ref 150–440)
RBC: 4.43 MIL/uL (ref 3.80–5.20)
RDW: 15.6 % — ABNORMAL HIGH (ref 11.5–14.5)
WBC: 11.3 10*3/uL — ABNORMAL HIGH (ref 3.6–11.0)

## 2016-08-26 LAB — GLUCOSE, CAPILLARY: Glucose-Capillary: 181 mg/dL — ABNORMAL HIGH (ref 65–99)

## 2016-08-26 LAB — PROTIME-INR
INR: 2.95
Prothrombin Time: 31.4 seconds — ABNORMAL HIGH (ref 11.4–15.2)

## 2016-08-26 LAB — MAGNESIUM: MAGNESIUM: 2.5 mg/dL — AB (ref 1.7–2.4)

## 2016-08-26 LAB — TROPONIN I

## 2016-08-26 LAB — BRAIN NATRIURETIC PEPTIDE: B Natriuretic Peptide: 443 pg/mL — ABNORMAL HIGH (ref 0.0–100.0)

## 2016-08-26 MED ORDER — ACETAMINOPHEN 650 MG RE SUPP: 650.0000 mg | Freq: Four times a day (QID) | RECTAL | Status: DC | PRN

## 2016-08-26 MED ORDER — FUROSEMIDE 10 MG/ML IJ SOLN
60.0000 mg | Freq: Once | INTRAMUSCULAR | Status: AC
  Administered 2016-08-26: 60 mg via INTRAVENOUS
  Filled 2016-08-26: qty 8

## 2016-08-26 MED ORDER — GABAPENTIN 100 MG PO CAPS
100.0000 mg | ORAL_CAPSULE | Freq: Three times a day (TID) | ORAL | Status: DC
  Administered 2016-08-26 – 2016-08-31 (×15): 100 mg via ORAL
  Filled 2016-08-26 (×15): qty 1

## 2016-08-26 MED ORDER — DILTIAZEM HCL ER 60 MG PO CP12
120.0000 mg | ORAL_CAPSULE | Freq: Two times a day (BID) | ORAL | Status: DC
  Administered 2016-08-26 – 2016-08-31 (×9): 120 mg via ORAL
  Filled 2016-08-26 (×11): qty 2

## 2016-08-26 MED ORDER — FUROSEMIDE 10 MG/ML IJ SOLN
60.0000 mg | Freq: Two times a day (BID) | INTRAMUSCULAR | Status: DC
  Administered 2016-08-26 – 2016-08-28 (×5): 60 mg via INTRAVENOUS
  Filled 2016-08-26 (×5): qty 6

## 2016-08-26 MED ORDER — IPRATROPIUM-ALBUTEROL 0.5-2.5 (3) MG/3ML IN SOLN
3.0000 mL | RESPIRATORY_TRACT | Status: DC | PRN
Start: 2016-08-26 — End: 2016-08-31
  Administered 2016-08-27: 3 mL via RESPIRATORY_TRACT
  Filled 2016-08-26: qty 3

## 2016-08-26 MED ORDER — ALBUTEROL SULFATE (2.5 MG/3ML) 0.083% IN NEBU: 3.0000 mL | INHALATION_SOLUTION | Freq: Four times a day (QID) | RESPIRATORY_TRACT | Status: DC | PRN

## 2016-08-26 MED ORDER — METOPROLOL TARTRATE 100 MG PO TABS
100.0000 mg | ORAL_TABLET | Freq: Two times a day (BID) | ORAL | Status: DC
  Administered 2016-08-26 – 2016-08-28 (×4): 100 mg via ORAL
  Filled 2016-08-26: qty 2
  Filled 2016-08-26 (×2): qty 1
  Filled 2016-08-26: qty 2

## 2016-08-26 MED ORDER — FLECAINIDE ACETATE 50 MG PO TABS
50.0000 mg | ORAL_TABLET | Freq: Once | ORAL | Status: AC
  Administered 2016-08-26: 50 mg via ORAL
  Filled 2016-08-26: qty 1

## 2016-08-26 MED ORDER — ORAL CARE MOUTH RINSE
15.0000 mL | Freq: Two times a day (BID) | OROMUCOSAL | Status: DC
  Administered 2016-08-27 – 2016-08-31 (×7): 15 mL via OROMUCOSAL

## 2016-08-26 MED ORDER — POTASSIUM CHLORIDE CRYS ER 20 MEQ PO TBCR
20.0000 meq | EXTENDED_RELEASE_TABLET | Freq: Every day | ORAL | Status: DC
  Administered 2016-08-27: 20 meq via ORAL
  Filled 2016-08-26: qty 1

## 2016-08-26 MED ORDER — PROCHLORPERAZINE EDISYLATE 5 MG/ML IJ SOLN
5.0000 mg | INTRAMUSCULAR | Status: DC | PRN
  Administered 2016-08-27 – 2016-08-28 (×2): 5 mg via INTRAVENOUS
  Filled 2016-08-26: qty 1
  Filled 2016-08-26 (×2): qty 2

## 2016-08-26 MED ORDER — TRAZODONE HCL 50 MG PO TABS
25.0000 mg | ORAL_TABLET | Freq: Every evening | ORAL | Status: DC | PRN
  Administered 2016-08-28: 25 mg via ORAL
  Filled 2016-08-26: qty 1

## 2016-08-26 MED ORDER — DOCUSATE SODIUM 100 MG PO CAPS
100.0000 mg | ORAL_CAPSULE | Freq: Two times a day (BID) | ORAL | Status: DC
  Administered 2016-08-26 – 2016-08-31 (×11): 100 mg via ORAL
  Filled 2016-08-26 (×11): qty 1

## 2016-08-26 MED ORDER — ONDANSETRON HCL 4 MG/2ML IJ SOLN
4.0000 mg | Freq: Four times a day (QID) | INTRAMUSCULAR | Status: DC | PRN
  Administered 2016-08-26 – 2016-08-27 (×2): 4 mg via INTRAVENOUS
  Filled 2016-08-26 (×2): qty 2

## 2016-08-26 MED ORDER — WARFARIN SODIUM 5 MG PO TABS
2.5000 mg | ORAL_TABLET | ORAL | Status: DC
  Administered 2016-08-26: 2.5 mg via ORAL
  Filled 2016-08-26: qty 1

## 2016-08-26 MED ORDER — WARFARIN - PHARMACIST DOSING INPATIENT
Freq: Every day | Status: DC
  Administered 2016-08-27 – 2016-08-30 (×3)

## 2016-08-26 MED ORDER — FLECAINIDE ACETATE 50 MG PO TABS
50.0000 mg | ORAL_TABLET | Freq: Two times a day (BID) | ORAL | Status: DC
  Administered 2016-08-26 – 2016-08-31 (×10): 50 mg via ORAL
  Filled 2016-08-26 (×11): qty 1

## 2016-08-26 MED ORDER — WARFARIN SODIUM 2 MG PO TABS: 2.0000 mg | ORAL_TABLET | ORAL | Status: DC

## 2016-08-26 MED ORDER — HEPARIN SODIUM (PORCINE) 5000 UNIT/ML IJ SOLN: 5000.0000 [IU] | Freq: Three times a day (TID) | INTRAMUSCULAR | Status: DC

## 2016-08-26 MED ORDER — VITAMIN B-12 1000 MCG PO TABS
1000.0000 ug | ORAL_TABLET | Freq: Every day | ORAL | Status: DC
  Administered 2016-08-26 – 2016-08-31 (×6): 1000 ug via ORAL
  Filled 2016-08-26 (×6): qty 1

## 2016-08-26 MED ORDER — ALPRAZOLAM 0.25 MG PO TABS
0.2500 mg | ORAL_TABLET | Freq: Every evening | ORAL | Status: DC | PRN
  Administered 2016-08-26 – 2016-08-30 (×5): 0.25 mg via ORAL
  Filled 2016-08-26 (×5): qty 1

## 2016-08-26 MED ORDER — ACETAMINOPHEN 325 MG PO TABS: 650.0000 mg | ORAL_TABLET | Freq: Four times a day (QID) | ORAL | Status: DC | PRN

## 2016-08-26 MED ORDER — BISACODYL 5 MG PO TBEC: 5.0000 mg | DELAYED_RELEASE_TABLET | Freq: Every day | ORAL | Status: DC | PRN

## 2016-08-26 MED ORDER — LEVALBUTEROL HCL 1.25 MG/0.5ML IN NEBU
INHALATION_SOLUTION | RESPIRATORY_TRACT | Status: AC
  Filled 2016-08-26: qty 0.5

## 2016-08-26 MED ORDER — ONDANSETRON HCL 4 MG PO TABS: 4.0000 mg | ORAL_TABLET | Freq: Four times a day (QID) | ORAL | Status: DC | PRN

## 2016-08-26 MED ORDER — CHLORHEXIDINE GLUCONATE 0.12 % MT SOLN
15.0000 mL | Freq: Two times a day (BID) | OROMUCOSAL | Status: DC
  Administered 2016-08-27 – 2016-08-31 (×10): 15 mL via OROMUCOSAL
  Filled 2016-08-26 (×10): qty 15

## 2016-08-26 MED ORDER — VITAMIN B-6 50 MG PO TABS
200.0000 mg | ORAL_TABLET | Freq: Two times a day (BID) | ORAL | Status: DC
  Administered 2016-08-26 – 2016-08-31 (×10): 200 mg via ORAL
  Filled 2016-08-26 (×11): qty 4

## 2016-08-26 MED ORDER — METOPROLOL TARTRATE 50 MG PO TABS
100.0000 mg | ORAL_TABLET | Freq: Once | ORAL | Status: AC
  Administered 2016-08-26: 100 mg via ORAL
  Filled 2016-08-26: qty 2

## 2016-08-26 NOTE — ED Notes (Signed)
Snack given at thist iem

## 2016-08-26 NOTE — ED Provider Notes (Signed)
[facility] Emergency Department Provider Note  ____________________________________________  Time seen: Approximately 1:40 PM  I have reviewed the triage vital signs and the nursing notes.   HISTORY  Chief Complaint Shortness of Breath and Leg Swelling   HPI Hannah Faulkner is a 76 y.o. female with a history of CHF with preserved ejection fraction, atrial fibrillation, and hypertension who presents from her cardiologist's office for volume overload.Patient was recently discharged from the hospital 2 weeks ago after being admitted for community-acquired pneumonia. Since then she has had progressively worsening lower extremity edema, orthopnea, and a total of 16 pound weight gain. She has been managed by her outpatient cardiologist Dr. Saralyn Pilar. She is on 40 mg of daily Lasix. Today she went back for follow-up appointment and he was sent here by him for admission for IV diuresis. Patient denies chest pain. She is complaining of severe shortness of breath and is requiring 4 L oxygen at home. Patient's baseline 2 L since being discharged from the hospital. She denies abdominal pain, cough, congestion, fever, chills, nausea, vomiting, diarrhea.  Past Medical History:  Diagnosis Date  . A-fib ([place])   . CHF (congestive heart failure) ([place])   . Hypertension     Patient Active Problem List   Diagnosis Date Noted  . Community acquired pneumonia 08/04/2016  . Tricuspid regurgitation 05/15/2015  . Mitral valve regurgitation 05/15/2015  . Anxiety 08/25/2014  . A-fib ([place]) 08/25/2014  . Benign neoplasm of colon 08/25/2014  . 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 Diabetic neuropathy ([place]) 08/25/2014  . Essential (primary) hypertension 08/25/2014  . 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 Adiposity 08/25/2014  . Obstructive apnea 08/25/2014  . Hypercholesterolemia without hypertriglyceridemia  08/25/2014  . B12 deficiency 08/25/2014  . H/O cardiac catheterization 07/15/2013  . BP (high blood pressure) 07/15/2013  . Apnea, sleep 07/15/2013  . AF (paroxysmal atrial fibrillation) ([place]) 07/15/2013    Past Surgical History:  Procedure Laterality Date  . EYE SURGERY    . POLYPECTOMY     colon poly premoved    Prior to Admission medications   Medication Sig Start Date End Date Taking? Authorizing Provider  ALPRAZolam (XANAX) 0.5 MG tablet TAKE 1 TABLET BY MOUTH AT BEDTIME AS NEEDED FOR ANXIETY Patient taking differently: TAKE 1/2 TABLET BY MOUTH AT BEDTIME AS NEEDED FOR ANXIETY 01/08/16  Yes Jerrol Banana., MD  cyanocobalamin 1000 MCG tablet Take 1,000 mcg by mouth daily.   Yes [provider]  diltiazem (CARDIZEM) 120 MG tablet Take 120 mg by mouth 2 times daily at 12 noon and 4 pm.    Yes [provider]  flecainide (TAMBOCOR) 50 MG tablet Take 50 mg by mouth 2 (two) times daily.  11/16/13  Yes [provider]  furosemide (LASIX) 40 MG tablet Take 1 tablet (40 mg total) by mouth daily. 08/14/16  Yes Fritzi Mandes, MD  gabapentin (NEURONTIN) 100 MG capsule Take 2 capsules (200 mg total) by mouth 3 (three) times daily. Patient taking differently: Take 100 mg by mouth 3 (three) times daily.  05/14/16  Yes Jerrol Banana., MD  hydrALAZINE (APRESOLINE) 25 MG tablet TAKE 1 TABLET BY MOUTH 3 TIMES A DAY Patient taking differently: TAKE 1 TABLET BY MOUTH TWICE DAILY 04/08/16  Yes Jerrol Banana., MD  metoprolol (LOPRESSOR) 50 MG tablet Take 100 mg by mouth 2 (two) times daily.  11/16/13  Yes [provider]  potassium chloride SA (K-DUR,KLOR-CON) 20 MEQ tablet Take 1 tablet (20 mEq total) by mouth daily. Patient taking differently: Take 30 mEq by mouth daily.  08/15/16  Yes Fritzi Mandes, MD  PROAIR HFA 108 [phone] Base) MCG/ACT inhaler INHALE 2 INHALATIONS INTO THE LUNGS EVERY 6 (SIX) HOURS AS NEEDED FOR WHEEZING. 04/04/15  Yes [provider]  Pyridoxine HCl (VITAMIN B6) 200 MG TABS Take 200 tablets by mouth 2 (two) times daily.  11/16/13  Yes [provider]  warfarin (COUMADIN) 2 MG tablet Take 2 mg by mouth every other day. Take 2mg  one day and take 2.5mg  next day, alternating.   Yes [provider]  warfarin (COUMADIN) 2.5 MG tablet Take 2.5 mg by mouth every other day. Take 2mg  one day and take 2.5mg  next day, alternating. 11/16/13  Yes [provider]    Allergies Ampicillin; Prinzide  [lisinopril-hydrochlorothiazide]; and Penicillins  Family History  Problem Relation Age of Onset  . Hypertension Mother   . Heart disease Mother   . CVA Mother   . Heart attack Mother   . Lung cancer Father   . Heart attack Sister   . CVA Sister   . Thyroid disease Sister   . Liver cancer Brother   . Thyroid disease Sister   . Thyroid disease Sister   . Diabetes Sister   . Dementia Sister   . Atrial fibrillation Sister     Social History Social History  Substance Use Topics  . Smoking status: Never Smoker  . Smokeless tobacco: Never Used  . Alcohol use No    Review of Systems  Constitutional: Negative for fever. Eyes: Negative for visual changes. ENT: Negative for sore throat. Neck: No neck pain  Cardiovascular: Negative for chest pain. Respiratory: + shortness of breath. Gastrointestinal: Negative for abdominal pain, vomiting or diarrhea. Genitourinary: Negative for dysuria. Musculoskeletal: Negative for back pain. + b/l LE edema Skin: Negative for rash. Neurological: Negative for headaches, weakness or numbness. Psych: No SI or HI  ____________________________________________   PHYSICAL EXAM:  VITAL SIGNS: ED Triage Vitals  Enc Vitals Group     BP 08/26/16 1205 118/77     Pulse Rate 08/26/16 1205 (!) 118     Resp 08/26/16 1205 (!) 26     Temp 08/26/16 1205 98.7 F (37.1 C)     Temp Source 08/26/16 1205 Oral     SpO2 08/26/16 1205 (!) 88 %     Weight 08/26/16  1205 192 lb (87.1 kg)     Height 08/26/16 1205 5\' 1"  (1.549 m)     Head Circumference --      Peak Flow --      Pain Score 08/26/16 1204 0     Pain Loc --      Pain Edu? --      Excl. in [place]? --     Constitutional: Alert and oriented. Well appearing and in no apparent distress. HEENT:      Head: Normocephalic and atraumatic.         Eyes: Conjunctivae are normal. Sclera is non-icteric.       Mouth/Throat: Mucous membranes are moist.       Neck: Supple with no signs of meningismus. Cardiovascular: Irregularly irregular rhythm with tachycardic rate. No murmurs, gallops, or rubs. 2+ symmetrical distal pulses are present in all extremities. Elevated JVD to the angle of the jaw. Respiratory: Normal respiratory effort. Lungs are clear to auscultation bilaterally. No wheezes, crackles, or rhonchi.  Gastrointestinal: Soft,  non tender, and non distended with positive bowel sounds. No rebound or guarding. Musculoskeletal: 2+ pitting edema bilateral lower extremities Neurologic: Normal speech and language. Face is symmetric. Moving all extremities. No gross focal neurologic deficits are appreciated. Skin: Skin is warm, dry and intact. No rash noted. Psychiatric: Mood and affect are normal. Speech and behavior are normal.  ____________________________________________   LABS (all labs ordered are listed, but only abnormal results are displayed)  Labs Reviewed  BASIC METABOLIC PANEL - Abnormal; Notable for the following:       Result Value   Glucose, Bld 117 (*)    BUN 22 (*)    Calcium 8.8 (*)    All other components within normal limits  CBC - Abnormal; Notable for the following:    WBC 11.3 (*)    RDW 15.6 (*)    All other components within normal limits  BRAIN NATRIURETIC PEPTIDE - Abnormal; Notable for the following:    B Natriuretic Peptide 443.0 (*)    All other components within normal limits  PROTIME-INR - Abnormal; Notable for the following:    Prothrombin Time 31.4 (*)    All  other components within normal limits  MAGNESIUM - Abnormal; Notable for the following:    Magnesium 2.5 (*)    All other components within normal limits  TROPONIN I   ____________________________________________  EKG  ED ECG REPORT I, Rudene Re, the attending physician, personally viewed and interpreted this ECG.  Atrial fibrillation, rate of 107, normal QTC, right axis deviation, low voltage QRS, no ST elevations or depressions. Unchanged from prior from May 2018 ____________________________________________  RADIOLOGY  CXR:  Cardiomegaly with suspected mild interstitial edema and small bilateral pleural effusions, right greater than left.  Superimposed lower lobe atelectasis and/or pneumonia is possible. ____________________________________________   PROCEDURES  Procedure(s) performed: None Procedures Critical Care performed:  None ____________________________________________   INITIAL IMPRESSION / ASSESSMENT AND PLAN / ED COURSE  76 y.o. female with a history of CHF with preserved ejection fraction, atrial fibrillation, and hypertension who presents from her cardiologist's office for volume overload.BNP elevated, chest x-ray with cardiomegaly, mild pulmonary edema and bilateral pleural effusions. Patient is volume overloaded with 2+ pitting edema, elevated JVD. Patient was given 60 mg of IV Lasix. EKG with no ischemic changes. Electrolytes within normal limits. Patient in atrial fibrillation with well-controlled ventricular rate. Patient received her a.m. dose of metoprolol and flecainide. Patient is going to be admitted to the hospitalist for IV diureses.      Pertinent labs & imaging results that were available during my care of the patient were reviewed by me and considered in my medical decision making (see chart for details).    ____________________________________________   FINAL CLINICAL IMPRESSION(S) / ED DIAGNOSES  Final diagnoses:  Acute on  chronic congestive heart failure, unspecified heart failure type ([place])      NEW MEDICATIONS STARTED DURING THIS VISIT:  New Prescriptions   No medications on file     Note:  This document was prepared using Dragon voice recognition software and may include unintentional dictation errors.    Alfred Levins, [place], MD 08/26/16 [phone]

## 2016-08-26 NOTE — Progress Notes (Signed)
ANTICOAGULATION CONSULT NOTE - Initial Consult  Pharmacy Consult for warfarin Indication: atrial fibrillation  Allergies  Allergen Reactions  . Ampicillin   . Prinzide  [Lisinopril-Hydrochlorothiazide]   . Penicillins Rash    .Has patient had a PCN reaction causing immediate rash, facial/tongue/throat swelling, SOB or lightheadedness with hypotension: Unknown Has patient had a PCN reaction causing severe rash involving mucus membranes or skin necrosis: No Has patient had a PCN reaction that required hospitalization: No Has patient had a PCN reaction occurring within the last 10 years: No If all of the above answers are "NO", then may proceed with Cephalosporin use.     Patient Measurements: Height: 5\' 1"  (154.9 cm) Weight: 192 lb (87.1 kg) IBW/kg (Calculated) : 47.8 Heparin Dosing Weight:   Vital Signs: Temp: 98.7 F (37.1 C) (06/05 1205) Temp Source: Oral (06/05 1205) BP: 113/72 (06/05 1430) Pulse Rate: 123 (06/05 1400)  Labs:  Recent Labs  08/26/16 1213  HGB 12.6  HCT 38.0  PLT 284  LABPROT 31.4*  INR 2.95  CREATININE 0.76  TROPONINI <0.03    Estimated Creatinine Clearance: 60 mL/min (by C-G formula based on SCr of 0.76 mg/dL).   Medical History: Past Medical History:  Diagnosis Date  . A-fib ([place])   . CHF (congestive heart failure) ([place])   . Hypertension     Medications:  Infusions:    Assessment: 76 yof cc SOB/leg swelling on VKA for AF, INR on admission 2.95. Sent here from cardiologist's office for diuresis d/t acute on chronic diastolic heart failure. Pharmacy consulted to manage VKA while admitted.  Goal of Therapy:  INR 2-3 Monitor platelets by anticoagulation protocol: Yes   Plan:  Continue home regimen: warfarin 2 mg po every other day alternate with warfarin 2.5 mg po every other day. Will follow INR daily while IP due to acute on chronic diastolic heart failure and drug-disease interaction.   Laural Benes, Pharm.D., BCPS Clinical  Pharmacist 08/26/2016,3:23 PM

## 2016-08-26 NOTE — H&P (Signed)
[facility] NAME: Hannah Faulkner    MR#:  [id number]  DATE OF BIRTH:  [birth year]  DATE OF ADMISSION:  08/26/2016  PRIMARY CARE PHYSICIAN: Jerrol Banana., MD   REQUESTING/REFERRING PHYSICIAN; Dr. Alfred Levins  CHIEF COMPLAINT: Shortness of breath    Chief Complaint  Patient presents with  . Shortness of Breath  . Leg Swelling    HISTORY OF PRESENT ILLNESS:  Hannah Faulkner  is a 76 y.o. female with a known history ofDiastolic heart failure, chronic atrial fibrillation from cardiology office for weight gain, pedal edema, shortness of breath. Discharge from hospital on 24th for CHF exacerbation Discharge home with Lasix 40 mg daily. Comes back today with same complaint of shortness of breath, weight gain of 16 pounds since last discharge, pedal edema, orthopnea.  PAST MEDICAL HISTORY:   Past Medical History:  Diagnosis Date  . A-fib ([place])   . CHF (congestive heart failure) ([place])   . Hypertension     PAST SURGICAL HISTOIRY:   Past Surgical History:  Procedure Laterality Date  . EYE SURGERY    . POLYPECTOMY     colon poly premoved    SOCIAL HISTORY:   Social History  Substance Use Topics  . Smoking status: Never Smoker  . Smokeless tobacco: Never Used  . Alcohol use No    FAMILY HISTORY:   Family History  Problem Relation Age of Onset  . Hypertension Mother   . Heart disease Mother   . CVA Mother   . Heart attack Mother   . Lung cancer Father   . Heart attack Sister   . CVA Sister   . Thyroid disease Sister   . Liver cancer Brother   . Thyroid disease Sister   . Thyroid disease Sister   . Diabetes Sister   . Dementia Sister   . Atrial fibrillation Sister     DRUG ALLERGIES:   Allergies  Allergen Reactions  . Ampicillin   . Prinzide  [Lisinopril-Hydrochlorothiazide]   . Penicillins Rash    .Has patient had a PCN reaction causing immediate rash, facial/tongue/throat swelling, SOB or  lightheadedness with hypotension: Unknown Has patient had a PCN reaction causing severe rash involving mucus membranes or skin necrosis: No Has patient had a PCN reaction that required hospitalization: No Has patient had a PCN reaction occurring within the last 10 years: No If all of the above answers are "NO", then may proceed with Cephalosporin use.     REVIEW OF SYSTEMS:  CONSTITUTIONAL: No fever, fatigue or weakness.  EYES: No blurred or double vision.  EARS, NOSE, AND THROAT: No tinnitus or ear pain.  RESPIRATORY: Shortness of breath wCARDIOVASCULAR: No chest pain, patient is orthopnea, PND, pedal edema. GASTROINTESTINAL: No nausea, vomiting, diarrhea or abdominal pain.  GENITOURINARY: No dysuria, hematuria.  ENDOCRINE: No polyuria, nocturia,  HEMATOLOGY: No anemia, easy bruising or bleeding SKIN: No rash or lesion. MUSCULOSKELETAL: No joint pain or arthritis.   NEUROLOGIC: No tingling, numbness, weakness.  PSYCHIATRY: No anxiety or depression.   MEDICATIONS AT HOME:   Prior to Admission medications   Medication Sig Start Date End Date Taking? Authorizing Provider  ALPRAZolam (XANAX) 0.5 MG tablet TAKE 1 TABLET BY MOUTH AT BEDTIME AS NEEDED FOR ANXIETY Patient taking differently: TAKE 1/2 TABLET BY MOUTH AT BEDTIME AS NEEDED FOR ANXIETY 01/08/16  Yes Jerrol Banana., MD  cyanocobalamin 1000 MCG tablet Take 1,000 mcg by mouth daily.   Yes [provider]  diltiazem (CARDIZEM) 120 MG tablet Take 120 mg by mouth 2 times daily at 12 noon and 4 pm.    Yes [provider]  flecainide (TAMBOCOR) 50 MG tablet Take 50 mg by mouth 2 (two) times daily.  11/16/13  Yes [provider]  furosemide (LASIX) 40 MG tablet Take 1 tablet (40 mg total) by mouth daily. 08/14/16  Yes Fritzi Mandes, MD  gabapentin (NEURONTIN) 100 MG capsule Take 2 capsules (200 mg total) by mouth 3 (three) times daily. Patient taking differently: Take 100 mg by mouth 3 (three) times daily.   05/14/16  Yes Jerrol Banana., MD  hydrALAZINE (APRESOLINE) 25 MG tablet TAKE 1 TABLET BY MOUTH 3 TIMES A DAY Patient taking differently: TAKE 1 TABLET BY MOUTH TWICE DAILY 04/08/16  Yes Jerrol Banana., MD  metoprolol (LOPRESSOR) 50 MG tablet Take 100 mg by mouth 2 (two) times daily.  11/16/13  Yes [provider]  potassium chloride SA (K-DUR,KLOR-CON) 20 MEQ tablet Take 1 tablet (20 mEq total) by mouth daily. Patient taking differently: Take 30 mEq by mouth daily.  08/15/16  Yes Fritzi Mandes, MD  PROAIR HFA 108 [phone] Base) MCG/ACT inhaler INHALE 2 INHALATIONS INTO THE LUNGS EVERY 6 (SIX) HOURS AS NEEDED FOR WHEEZING. 04/04/15  Yes [provider]  Pyridoxine HCl (VITAMIN B6) 200 MG TABS Take 200 tablets by mouth 2 (two) times daily.  11/16/13  Yes [provider]  warfarin (COUMADIN) 2 MG tablet Take 2 mg by mouth every other day. Take 2mg  one day and take 2.5mg  next day, alternating.   Yes [provider]  warfarin (COUMADIN) 2.5 MG tablet Take 2.5 mg by mouth every other day. Take 2mg  one day and take 2.5mg  next day, alternating. 11/16/13  Yes [provider]      VITAL SIGNS:  Blood pressure 113/72, pulse (!) 123, temperature 98.7 F (37.1 C), temperature source Oral, resp. rate 20, height 5\' 1"  (1.549 m), weight 87.1 kg (192 lb), SpO2 97 %.  PHYSICAL EXAMINATION:  GENERAL:  76 y.o.-year-old patient lying in the bed with no acute distress.  EYES: Pupils equal, round, reactive to light . No scleral icterus. Extraocular muscles intact.  HEENT: Head atraumatic, normocephalic. Oropharynx and nasopharynx clear.  NECK:  Supple, no jugular venous distention. No thyroid enlargement, no tenderness.  LUNGS:Basilar crepitations  CARDIOVASCULAR: S1, S2 normal. No murmurs, rubs, or gallops.  ABDOMEN: Soft, nontender, nondistended. Bowel sounds present. No organomegaly or mass.  EXTREMITIES: No pedal edema, cyanosis, or clubbing.  NEUROLOGIC: Cranial  nerves II through XII are intact. Muscle strength 5/5 in all extremities. Sensation intact. Gait not checked.  PSYCHIATRIC: The patient is alert and oriented x 3.  SKIN: No obvious rash, lesion, or ulcer.   LABORATORY PANEL:   CBC  Recent Labs Lab 08/26/16 1213  WBC 11.3*  HGB 12.6  HCT 38.0  PLT 284   ------------------------------------------------------------------------------------------------------------------  Chemistries   Recent Labs Lab 08/26/16 1213  NA 139  K 4.7  CL 101  CO2 31  GLUCOSE 117*  BUN 22*  CREATININE 0.76  CALCIUM 8.8*  MG 2.5*   ------------------------------------------------------------------------------------------------------------------  Cardiac Enzymes  Recent Labs Lab 08/26/16 1213  TROPONINI <0.03   ------------------------------------------------------------------------------------------------------------------  RADIOLOGY:  Dg Chest 2 View  Result Date: 08/26/2016 CLINICAL DATA:  Shortness of breath, bilateral lower extremity swelling EXAM: CHEST  2 VIEW COMPARISON:  08/12/2016 FINDINGS: Perihilar and bilateral lower lobe opacities, favored to reflect mild interstitial edema.  Superimposed lower lobe atelectasis and/or pneumonia is possible. Small bilateral pleural effusions, right greater than left. No pneumothorax. Cardiomegaly. Mild degenerative changes of the visualized thoracolumbar spine. IMPRESSION: Cardiomegaly with suspected mild interstitial edema and small bilateral pleural effusions, right greater than left. Superimposed lower lobe atelectasis and/or pneumonia is possible. Electronically Signed   By: Julian Hy M.D.   On: 08/26/2016 13:35    EKG:   Orders placed or performed during the hospital encounter of 08/26/16  . ED EKG within 10 minutes  . ED EKG within 10 minutes    IMPRESSION AND PLAN:   #1 acute on chronic  diastolic heart failure, recent ejection fraction more than 60% by echocardiogram 2 weeks  ago. Admitted to hospitalist service on telemetry, start IV Lasix 60 mg every 12, check daily weights, continue low-salt diet. Obtain cardiology consult #2 chronic A. fib: Patient is in A. fib with heart rate in 110s but it is slightly high because of CHF. Continue, T[place]ocor,metoprolol, Cardizem. Continue coumadin, #3. Hypertension: Controlled #4 .anxiety    All the records are reviewed and case discussed with ED provider. Management plans discussed with the patient, family and they are in agreement.  CODE STATUS:full  TOTAL TIME TAKING CARE OF THIS PATIENT: 30minutes.    Epifanio Lesches M.D on 08/26/2016 at 3:01 PM  Between 7am to 6pm - Pager - [phone]  After 6pm go to [email or web address] - password [email or web address] [facility]  Office  [phone]  CC: Primary care physician; Jerrol Banana., MD  Note: This dictation was prepared with Dragon dictation along with smaller phrase technology. Any transcriptional errors that result from this process are unintentional.

## 2016-08-26 NOTE — ED Triage Notes (Signed)
Pt to ER via POV from PCP office for SOB, leg swelling, cough. Pt discharged after hospital admission for pneumonia in May. Pt appears very SOB. Pt on 2L chronically.

## 2016-08-26 NOTE — Progress Notes (Signed)
Name: Hannah Faulkner MRN: [id number] DOB: 1940/12/07    ADMISSION DATE:  08/26/2016  REFERRING MD :  Dr. Jannifer Franklin  CHIEF COMPLAINT:  SOB  BRIEF PATIENT DESCRIPTION: 76 year old female with known hx of CHF transferred from the floor due to shortness of breath related to worsening Pulmonary edema/CHF Exacerbation requiring BiPAP.  SIGNIFICANT EVENTS  08/26/16 >> patient admitted with CHF exacerbation 08/26/16>> Patient transferred from the floor related to  Worsening pulmonary edema/ afib with RVR.  STUDIES:  08/07/16 ECHO>>Left ventricle: The cavity size was normal. Systolic function was  normal. The estimated ejection fraction was in the range of 60%  to 65%.  HISTORY OF PRESENT ILLNESS:  Hannah Faulkner is a 76 year old female with known history of diastolic heart failure and Atrial fibrillation.  Patient was admitted on 6/5 for CHF exacerbation and was getting diuresed.  Patient diuresed well. Patient started experiencing some  Shortness  of breath toward late evening .  CXR  Was concerning for worsening Pulmonary edema.  Patient was transferred from the floor as the patient is having acute on chronic respiratory failure secondary to Pulmonary edema requiring BiPAP  PAST MEDICAL HISTORY :   has a past medical history of A-fib ([place]); CHF (congestive heart failure) ([place]); and Hypertension.  has a past surgical history that includes Polypectomy and Eye surgery. Prior to Admission medications   Medication Sig Start Date End Date Taking? Authorizing Provider  ALPRAZolam (XANAX) 0.5 MG tablet TAKE 1 TABLET BY MOUTH AT BEDTIME AS NEEDED FOR ANXIETY Patient taking differently: TAKE 1/2 TABLET BY MOUTH AT BEDTIME AS NEEDED FOR ANXIETY 01/08/16  Yes Jerrol Banana., MD  cyanocobalamin 1000 MCG tablet Take 1,000 mcg by mouth daily.   Yes [provider]  diltiazem (CARDIZEM) 120 MG tablet Take 120 mg by mouth 2 times daily at 12 noon and 4 pm.    Yes [provider]  flecainide  (TAMBOCOR) 50 MG tablet Take 50 mg by mouth 2 (two) times daily.  11/16/13  Yes [provider]  furosemide (LASIX) 40 MG tablet Take 1 tablet (40 mg total) by mouth daily. 08/14/16  Yes Fritzi Mandes, MD  gabapentin (NEURONTIN) 100 MG capsule Take 2 capsules (200 mg total) by mouth 3 (three) times daily. Patient taking differently: Take 100 mg by mouth 3 (three) times daily.  05/14/16  Yes Jerrol Banana., MD  hydrALAZINE (APRESOLINE) 25 MG tablet TAKE 1 TABLET BY MOUTH 3 TIMES A DAY Patient taking differently: TAKE 1 TABLET BY MOUTH TWICE DAILY 04/08/16  Yes Jerrol Banana., MD  metoprolol (LOPRESSOR) 50 MG tablet Take 100 mg by mouth 2 (two) times daily.  11/16/13  Yes [provider]  potassium chloride SA (K-DUR,KLOR-CON) 20 MEQ tablet Take 1 tablet (20 mEq total) by mouth daily. Patient taking differently: Take 30 mEq by mouth daily.  08/15/16  Yes Fritzi Mandes, MD  PROAIR HFA [id number] Base) MCG/ACT inhaler INHALE 2 INHALATIONS INTO THE LUNGS EVERY 6 (SIX) HOURS AS NEEDED FOR WHEEZING. 04/04/15  Yes [provider]  Pyridoxine HCl (VITAMIN B6) 200 MG TABS Take 200 tablets by mouth 2 (two) times daily.  11/16/13  Yes [provider]  warfarin (COUMADIN) 2 MG tablet Take 2 mg by mouth every other day. Take 2mg  one day and take 2.5mg  next day, alternating.   Yes [provider]  warfarin (COUMADIN) 2.5 MG tablet Take 2.5 mg by mouth every other day. Take 2mg   one day and take 2.5mg  next day, alternating. 11/16/13  Yes [provider]   Allergies  Allergen Reactions  . Ampicillin   . Prinzide  [Lisinopril-Hydrochlorothiazide]   . Penicillins Rash    .Has patient had a PCN reaction causing immediate rash, facial/tongue/throat swelling, SOB or lightheadedness with hypotension: Unknown Has patient had a PCN reaction causing severe rash involving mucus membranes or skin necrosis: No Has patient had a PCN reaction that required hospitalization:  No Has patient had a PCN reaction occurring within the last 10 years: No If all of the above answers are "NO", then may proceed with Cephalosporin use.     FAMILY HISTORY:  family history includes Atrial fibrillation in her sister; CVA in her mother and sister; Dementia in her sister; Diabetes in her sister; Heart attack in her mother and sister; Heart disease in her mother; Hypertension in her mother; Liver cancer in her brother; Lung cancer in her father; Thyroid disease in her sister, sister, and sister. SOCIAL HISTORY:  reports that she has never smoked. She has never used smokeless tobacco. She reports that she does not drink alcohol or use drugs.  REVIEW OF SYSTEMS:   Constitutional: Negative for fever, chills, weight loss, malaise/fatigue and diaphoresis.  HENT: Negative for hearing loss, ear pain, nosebleeds, congestion, sore throat, neck pain, tinnitus and ear discharge.   Eyes: Negative for blurred vision, double vision, photophobia, pain, discharge and redness.  Respiratory: Negative for cough, hemoptysis, sputum production, shortness of breath, wheezing and stridor.   Cardiovascular: Negative for chest pain, palpitations, orthopnea, claudication, leg swelling and PND.  Gastrointestinal: Negative for heartburn, nausea, vomiting, abdominal pain, diarrhea, constipation, blood in stool and melena.  Genitourinary: Negative for dysuria, urgency, frequency, hematuria and flank pain.  Musculoskeletal: Negative for myalgias, back pain, joint pain and falls.  Skin: Negative for itching and rash.  Neurological: Negative for dizziness, tingling, tremors, sensory change, speech change, focal weakness, seizures, loss of consciousness, weakness and headaches.  Endo/Heme/Allergies: Negative for environmental allergies and polydipsia. Does not bruise/bleed easily.  SUBJECTIVE: Patient states that "she is still short of breath"  VITAL SIGNS: Temp:  [97.8 F (36.6 C)-98.7 F (37.1 C)] 97.8 F  (36.6 C) (06/05 2112) Pulse Rate:  [37-123] 116 (06/05 2350) Resp:  [18-26] 24 (06/05 2350) BP: (91-136)/(68-99) 113/69 (06/05 2350) SpO2:  [88 %-97 %] 95 % (06/05 2350) FiO2 (%):  [40 %] 40 % (06/05 2350) Weight:  [87.1 kg (192 lb)-94.2 kg (207 lb 9.6 oz)] 92.1 kg (203 lb 0.7 oz) (06/05 2350)  PHYSICAL EXAMINATION: General:  Elderly white female , in some distress Neuro:  Awake,Alert and oriented HEENT:  AT,[place],PERRLA,no jvd Cardiovascular:  Tachycardic,no m/r/g noted Lungs:  Crackles, diminished bibasilar, no wheezes,rhonchi noted Abdomen:  Soft. NT,ND Musculoskeletal:  +2 BLE edema Skin:  Warm, dry and Intact   Recent Labs Lab 08/26/16 1213  NA 139  K 4.7  CL 101  CO2 31  BUN 22*  CREATININE 0.76  GLUCOSE 117*    Recent Labs Lab 08/26/16 1213  HGB 12.6  HCT 38.0  WBC 11.3*  PLT 284   Dg Chest 2 View  Result Date: 08/26/2016 CLINICAL DATA:  Shortness of breath, bilateral lower extremity swelling EXAM: CHEST  2 VIEW COMPARISON:  08/12/2016 FINDINGS: Perihilar and bilateral lower lobe opacities, favored to reflect mild interstitial edema. Superimposed lower lobe atelectasis and/or pneumonia is possible. Small bilateral pleural effusions, right greater than left. No pneumothorax. Cardiomegaly. Mild degenerative changes of the visualized thoracolumbar spine.  IMPRESSION: Cardiomegaly with suspected mild interstitial edema and small bilateral pleural effusions, right greater than left. Superimposed lower lobe atelectasis and/or pneumonia is possible. Electronically Signed   By: Julian Hy M.D.   On: 08/26/2016 13:35   Dg Chest Port 1 View  Result Date: 08/26/2016 CLINICAL DATA:  Dyspnea and cough EXAM: PORTABLE CHEST 1 VIEW COMPARISON:  08/26/2016 FINDINGS: Low lung volumes with stable cardiomegaly. The aorta is difficult to visualize. Small bilateral pleural effusions right greater than left with pulmonary edema and fluid in the right minor fissure, largely unchanged in  appearance. Bibasilar atelectasis and/or infiltrates. No acute osseous abnormality. IMPRESSION: Stable cardiomegaly with interstitial edema and right greater than left pleural effusions. Cannot exclude bibasilar atelectasis and/or pneumonia. Electronically Signed   By: Ashley Royalty M.D.   On: 08/26/2016 22:13    ASSESSMENT / PLAN:  Acute on chronic Respiratory failure related to worsening Pulmonary edema CHF exacerbation Atrial Fibrillation   Continue Bipap, wean as tolerated Continue Warfarin  Continue flecainide Continue Lasix Continue Diltiazem/metoprolol Strict I/O Follow BMP    Bincy Varughese,AG-ACNP lPulmonary and [place]   08/27/2016, 12:00 AM   PCCM ATTENDING ATTESTATION:  I have evaluated patient with the APP Varughese, reviewed database in its entirety and discussed care plan in detail. In addition, this patient was discussed on multidisciplinary rounds.   Important exam findings: No distress on [place] O2 HEENT WNL JVP not visualized Bilateral basilar crackles, no wheezes IR IR, no murmurs noted Symmetric pedal edema  CXR (08/26/16): Mild edema pattern  Major problems addressed by PCCM team: Acute hypoxemic respiratory failure, improving Pulmonary edema Chronic atrial fibrillation   PLAN/REC: Transfer to MedSurg floor Continue optimizing medical management of CHF Repeat chest x-ray a.m. 06/07  After transfer,PCCM will sign off. Please call if we can be of further assistance   Merton Border, MD PCCM service Mobile [phone] Pager [phone] 08/27/2016 12:24 PM

## 2016-08-26 NOTE — ED Notes (Signed)
Pt states she was recently dc from hospital for pneumonia, edema, and a fib. States she takes 40mg  lasix once a day. States her doctor sent her here for IV lasix, husband states she hasn't been responding to PO lasix. Pt is SOB at rest and movement. Since DC has been on 2 L nasal cannula at home. Bumped to 3 L nasal cannula in triage d/t oxygen in 80's. Pt appears SOB, states she can't lie down. peripheral edema noted to bilat lower legs and feet. Alert and oriented.

## 2016-08-26 NOTE — ED Notes (Signed)
FN: pt brought over to ED and dropped off at front desk, no report given to first nurse. Based on pt paperwork, needs further eval for oxygen levels and control of afib. Pt currently with oxygen in place with NAD.

## 2016-08-26 NOTE — Progress Notes (Signed)
Patient was sitting at bedside eating sandwich then suddenly felt like she could not breathe. Son at bedside. Patient verbalized she did not choke on any food. Spit remaining food out into basin. Short of breath. Lungs clear diminshed. No changes from assessment about 20 minutes prior. Heart rate sustaining in 130s- 150s. Oxygen level in 80s.  Md notified. Respiratory notified. Non rebreather placed. New orders placed. Given zofran for nausea.

## 2016-08-26 NOTE — ED Notes (Signed)
Pt ambulatory to toilet to urinate.  

## 2016-08-26 NOTE — ED Notes (Signed)
Pt in xray

## 2016-08-27 LAB — CBC
HCT: 35.7 % (ref 35.0–47.0)
Hemoglobin: 11.6 g/dL — ABNORMAL LOW (ref 12.0–16.0)
MCH: 27.9 pg (ref 26.0–34.0)
MCHC: 32.6 g/dL (ref 32.0–36.0)
MCV: 85.6 fL (ref 80.0–100.0)
Platelets: 243 10*3/uL (ref 150–440)
RBC: 4.17 MIL/uL (ref 3.80–5.20)
RDW: 15.5 % — ABNORMAL HIGH (ref 11.5–14.5)
WBC: 13.1 10*3/uL — ABNORMAL HIGH (ref 3.6–11.0)

## 2016-08-27 LAB — BASIC METABOLIC PANEL
Anion gap: 8 (ref 5–15)
BUN: 23 mg/dL — AB (ref 6–20)
CALCIUM: 8.2 mg/dL — AB (ref 8.9–10.3)
CO2: 33 mmol/L — ABNORMAL HIGH (ref 22–32)
CREATININE: 0.79 mg/dL (ref 0.44–1.00)
Chloride: 102 mmol/L (ref 101–111)
GFR calc Af Amer: >60 mL/min (ref >60)
GLUCOSE: 140 mg/dL — AB (ref 65–99)
Potassium: 3.6 mmol/L (ref 3.5–5.1)
Sodium: 143 mmol/L (ref 135–145)

## 2016-08-27 LAB — GLUCOSE, CAPILLARY: GLUCOSE-CAPILLARY: 156 mg/dL — AB (ref 65–99)

## 2016-08-27 LAB — PROTIME-INR
INR: 3.35
PROTHROMBIN TIME: 34.7 s — AB (ref 11.4–15.2)

## 2016-08-27 LAB — MRSA PCR SCREENING: MRSA BY PCR: NEGATIVE

## 2016-08-27 MED ORDER — WARFARIN SODIUM 1 MG PO TABS
1.0000 mg | ORAL_TABLET | Freq: Once | ORAL | Status: AC
  Administered 2016-08-27: 1 mg via ORAL
  Filled 2016-08-27: qty 1

## 2016-08-27 MED ORDER — LIDOCAINE 5 % EX PTCH
1.0000 | MEDICATED_PATCH | CUTANEOUS | Status: DC
  Administered 2016-08-27 – 2016-08-30 (×4): 1 via TRANSDERMAL
  Filled 2016-08-27 (×5): qty 1

## 2016-08-27 MED ORDER — ACETAMINOPHEN 325 MG PO TABS: 650.0000 mg | ORAL_TABLET | ORAL | Status: DC | PRN

## 2016-08-27 MED ORDER — POTASSIUM CHLORIDE CRYS ER 20 MEQ PO TBCR
20.0000 meq | EXTENDED_RELEASE_TABLET | Freq: Two times a day (BID) | ORAL | Status: DC
  Administered 2016-08-27 – 2016-08-31 (×8): 20 meq via ORAL
  Filled 2016-08-27 (×8): qty 1

## 2016-08-27 MED ORDER — WARFARIN 0.5 MG HALF TABLET
1.5000 mg | ORAL_TABLET | Freq: Once | ORAL | Status: DC
  Filled 2016-08-27: qty 1

## 2016-08-27 NOTE — Progress Notes (Signed)
ANTICOAGULATION CONSULT NOTE - Initial Consult  Pharmacy Consult for warfarin Indication: atrial fibrillation  Allergies  Allergen Reactions  . Ampicillin   . Prinzide  [Lisinopril-Hydrochlorothiazide]   . Penicillins Rash    .Has patient had a PCN reaction causing immediate rash, facial/tongue/throat swelling, SOB or lightheadedness with hypotension: Unknown Has patient had a PCN reaction causing severe rash involving mucus membranes or skin necrosis: No Has patient had a PCN reaction that required hospitalization: No Has patient had a PCN reaction occurring within the last 10 years: No If all of the above answers are "NO", then may proceed with Cephalosporin use.     Patient Measurements: Height: 5\' 2"  (157.5 cm) Weight: 203 lb 0.7 oz (92.1 kg) IBW/kg (Calculated) : 50.1 Heparin Dosing Weight:   Vital Signs: Temp: 97.5 F (36.4 C) (06/06 1039) Temp Source: Oral (06/06 1039) BP: 124/72 (06/06 1039) Pulse Rate: 103 (06/06 1039)  Labs:  Recent Labs  08/26/16 1213 08/27/16 0318  HGB 12.6 11.6*  HCT 38.0 35.7  PLT 284 243  LABPROT 31.4* 34.7*  INR 2.95 3.35  CREATININE 0.76 0.79  TROPONINI <0.03  --     Estimated Creatinine Clearance: 63.2 mL/min (by C-G formula based on SCr of 0.79 mg/dL).   Medical History: Past Medical History:  Diagnosis Date  . A-fib ([place])   . CHF (congestive heart failure) ([place])   . Hypertension     Medications:  Infusions:    Assessment: 76 yof cc SOB/leg swelling on VKA for AF, INR on admission 2.95. Sent here from cardiologist's office for diuresis d/t acute on chronic diastolic heart failure. Pharmacy consulted to manage VKA while admitted. Home regimen: warfarin 2 mg po every other day alternate with warfarin 2.5 mg po every other day. Per med rec patient took 2.5mg  on 6/4  Goal of Therapy:  INR 2-3 Monitor platelets by anticoagulation protocol: Yes   Plan:  INR slightly supratherapeutic at 3.35. Patient received 2.5mg   last night, however from med rec it appears pt was supposed to receive 2mg . I will give 1mg  tonight. Recheck INR in the AM.  Daily INR while IP.  Ramond Dial, Pharm.D., BCPS Clinical Pharmacist 08/27/2016,11:22 AM

## 2016-08-27 NOTE — Progress Notes (Signed)
[place] [place] NAME: Hannah Faulkner    MR#:  [id number]  DATE OF BIRTH:  [birth year]  SUBJECTIVE:  CHIEF COMPLAINT:   Chief Complaint  Patient presents with  . Shortness of Breath  . Leg Swelling    recent d/c with CHF, gained 16 Lb weight in last 3 days came with SOB, feels slightly better today, not enough diuresis yet. Drinks a lot of liquids at home.  REVIEW OF SYSTEMS:  CONSTITUTIONAL: No fever, fatigue or weakness.  EYES: No blurred or double vision.  EARS, NOSE, AND THROAT: No tinnitus or ear pain.  RESPIRATORY: No cough,positive for shortness of breath, no wheezing or hemoptysis.  CARDIOVASCULAR: No chest pain, orthopnea, edema.  GASTROINTESTINAL: No nausea, vomiting, diarrhea or abdominal pain.  GENITOURINARY: No dysuria, hematuria.  ENDOCRINE: No polyuria, nocturia,  HEMATOLOGY: No anemia, easy bruising or bleeding SKIN: No rash or lesion. MUSCULOSKELETAL: No joint pain or arthritis.   NEUROLOGIC: No tingling, numbness, weakness.  PSYCHIATRY: No anxiety or depression.   ROS  DRUG ALLERGIES:   Allergies  Allergen Reactions  . Ampicillin   . Prinzide  [Lisinopril-Hydrochlorothiazide]   . Penicillins Rash    .Has patient had a PCN reaction causing immediate rash, facial/tongue/throat swelling, SOB or lightheadedness with hypotension: Unknown Has patient had a PCN reaction causing severe rash involving mucus membranes or skin necrosis: No Has patient had a PCN reaction that required hospitalization: No Has patient had a PCN reaction occurring within the last 10 years: No If all of the above answers are "NO", then may proceed with Cephalosporin use.     VITALS:  Blood pressure 124/78, pulse (!) 124, temperature 97.6 F (36.4 C), temperature source Oral, resp. rate 18, height 5\' 2"  (1.575 m), weight 92.1 kg (203 lb 0.7 oz), SpO2 95 %.  PHYSICAL EXAMINATION:   GENERAL:  76 y.o.-year-old patient lying in the bed with no  acute distress.  EYES: Pupils equal, round, reactive to light . No scleral icterus. Extraocular muscles intact.  HEENT: Head atraumatic, normocephalic. Oropharynx and nasopharynx clear.  NECK:  Supple, no jugular venous distention. No thyroid enlargement, no tenderness.  LUNGS:Basilar crepitations  CARDIOVASCULAR: S1, S2 normal. No murmurs, rubs, or gallops.  ABDOMEN: Soft, nontender, nondistended. Bowel sounds present. No organomegaly or mass.  EXTREMITIES: No pedal edema, cyanosis, or clubbing.  NEUROLOGIC: Cranial nerves II through XII are intact. Muscle strength 5/5 in all extremities. Sensation intact. Gait not checked.  PSYCHIATRIC: The patient is alert and oriented x 3.  SKIN: No obvious rash, lesion, or ulcer.   Physical Exam LABORATORY PANEL:   CBC  Recent Labs Lab 08/27/16 0318  WBC 13.1*  HGB 11.6*  HCT 35.7  PLT 243   ------------------------------------------------------------------------------------------------------------------  Chemistries   Recent Labs Lab 08/26/16 1213 08/27/16 0318  NA 139 143  K 4.7 3.6  CL 101 102  CO2 31 33*  GLUCOSE 117* 140*  BUN 22* 23*  CREATININE 0.76 0.79  CALCIUM 8.8* 8.2*  MG 2.5*  --    ------------------------------------------------------------------------------------------------------------------  Cardiac Enzymes  Recent Labs Lab 08/26/16 1213  TROPONINI <0.03   ------------------------------------------------------------------------------------------------------------------  RADIOLOGY:  Dg Chest 2 View  Result Date: 08/26/2016 CLINICAL DATA:  Shortness of breath, bilateral lower extremity swelling EXAM: CHEST  2 VIEW COMPARISON:  08/12/2016 FINDINGS: Perihilar and bilateral lower lobe opacities, favored to reflect mild interstitial edema. Superimposed lower lobe atelectasis and/or pneumonia is possible. Small bilateral pleural effusions, right greater than left. No pneumothorax.  Cardiomegaly. Mild degenerative  changes of the visualized thoracolumbar spine. IMPRESSION: Cardiomegaly with suspected mild interstitial edema and small bilateral pleural effusions, right greater than left. Superimposed lower lobe atelectasis and/or pneumonia is possible. Electronically Signed   By: Julian Hy M.D.   On: 08/26/2016 13:35   Dg Chest Port 1 View  Result Date: 08/26/2016 CLINICAL DATA:  Dyspnea and cough EXAM: PORTABLE CHEST 1 VIEW COMPARISON:  08/26/2016 FINDINGS: Low lung volumes with stable cardiomegaly. The aorta is difficult to visualize. Small bilateral pleural effusions right greater than left with pulmonary edema and fluid in the right minor fissure, largely unchanged in appearance. Bibasilar atelectasis and/or infiltrates. No acute osseous abnormality. IMPRESSION: Stable cardiomegaly with interstitial edema and right greater than left pleural effusions. Cannot exclude bibasilar atelectasis and/or pneumonia. Electronically Signed   By: Ashley Royalty M.D.   On: 08/26/2016 22:13    ASSESSMENT AND PLAN:   Active Problems:   Acute diastolic CHF (congestive heart failure) (HCC)   #1 acute on chronic  diastolic heart failure, recent ejection fraction more than 60% by echocardiogram 2 weeks ago.   IV Lasix 60 mg every 12, check daily weights, continue low-salt diet. Obtain cardiology consult   Councelled about fluid restriction. #2 chronic A. fib: Patient is in A. fib with heart rate in 110s but it is slightly high because of CHF. Continue, metoprolol, Cardizem. Continue coumadin,   Pharmacy to adjust the dose. #3. Hypertension: Controlled #4 .anxiety    All the records are reviewed and case discussed with Care Management/Social Workerr. Management plans discussed with the patient, family and they are in agreement.  CODE STATUS: Full.  TOTAL TIME TAKING CARE OF THIS PATIENT: 35 minutes.     POSSIBLE D/C IN 1-2 DAYS, DEPENDING ON CLINICAL CONDITION.   Vaughan Basta M.D on 08/27/2016    Between 7am to 6pm - Pager - [phone]  After 6pm go to [email or web address] - password [email or web address] [facility]  Office  [phone]  CC: Primary care physician; Jerrol Banana., MD  Note: This dictation was prepared with Dragon dictation along with smaller phrase technology. Any transcriptional errors that result from this process are unintentional.

## 2016-08-27 NOTE — Progress Notes (Signed)
Patient stated that she can't breath , sat was check 87% with pt having labor breathing and pt was very restless , 02 was increase to 4 lits  With sat 92 - 94 %, RT was call for breathing treatment , also  DR Anselm Jungling was made aware , pt already given 60mg  lasix ivp , with little urine output , will continue to monitor

## 2016-08-27 NOTE — Progress Notes (Signed)
Patient resting at this time on 4 lits of 02nc  With sat 95%, foley cath inserted as per MD order , will continue to monitor

## 2016-08-27 NOTE — Progress Notes (Signed)
   08/27/16 0800  Vitals  BP 99/64  MAP (mmHg) 77  Pulse Rate (!) 102  ECG Heart Rate (!) 104  Resp 18  Oxygen Therapy  SpO2 95 %  O2 Device Nasal Cannula  O2 Flow Rate (L/min) 3 L/min  Pulse Oximetry Type Continuous  Pain Assessment  Pain Assessment No/denies pain

## 2016-08-27 NOTE — Progress Notes (Signed)
08/27/16 0802  Charting Type  Charting Type Shift assessment  Orders Chart Check (once per shift) Completed  Neurological  Neuro (WDL) WDL  Level of Consciousness Alert  Orientation Level Oriented X4  Cognition Appropriate at baseline;Appropriate attention/concentration;Appropriate judgement;Appropriate safety awareness;Follows commands  Speech Clear  R Hand Grip Present;Moderate  L Hand Grip Present;Moderate   Glasgow Coma Scale  Eye Opening 4  Best Verbal Response (NON-intubated) 5  Best Motor Response 6  Glasgow Coma Scale Score 15  [place] Agitation Sedation Scale  [place] Agitation Sedation Scale (RASS) 0  RASS Goal 0  CAM-ICU Feature 1:  Acute onset of fluctuating course  Is the patient different from his/her baseline mental status? No  Has the patient had any fluctuation in mental status in the past 24 hours? No  CAM-ICU Feature 4:  Disorganized thinking  CAM-ICU Negative? Yes  Delirium Prevention:  Universal Requirements (Complete for all ICU patients)  Universal Precautions Initiated *See Row Information* Yes  HEENT  HEENT (WDL) X  Vision Check No  Teeth Missing (Comment);Dentures upper  Tongue Pink  Mucous Membrane(s) Moist;Pink  Voice Clear  Respiratory  Respiratory (WDL) X  Respiratory Pattern Regular;Dyspnea with exertion  Chest Assessment Chest expansion symmetrical  Bilateral Breath Sounds Diminished;Clear  Cardiac  Cardiac (WDL) X  Pulse Irregular  Heart Sounds S1, S2;No adventitious heart sounds  Jugular Venous Distention (JVD) No  Cardiac Rhythm Atrial fibrillation  Ectopy Unifocal PVC's  Telemetry Box Number [id number]  Tele Box Verification Completed by Second Verifier Completed  Antiarrhythmic device No  Vascular  Vascular (WDL) X  Cyanosis None  Capillary Refill Less than 3 seconds  Pulses R radial;L radial;R dorsalis pedis;L dorsalis pedis  Edema Right lower extremity;Left lower extremity  Generalized Edema +1  RLE Edema +2  LLE Edema +2   RUE Neurovascular Assessment  R Radial Pulse +2  LUE Neurovascular Assessment  L Radial Pulse +2  RLE Neurovascular Assessment  R Dorsalis Pedis Pulse +1  LLE Neurovascular Assessment  L Dorsalis Pedis Pulse +1  Integumentary  Integumentary (WDL) WDL  Sacral Foam Prophylactic Dressing Protocol  Dressing Interventions Dressing intact  Dressing Change Due 08/30/16  Braden Scale (Ages 8 and up)  Sensory Perceptions 4  Moisture 4  Activity 3  Mobility 3  Nutrition 3  Friction and Shear 3  Braden Scale Score 20  Braden Interventions  Braden Scale Interventions PUP Handout;Careplan;Reposition q2h;Pillow;Heels  Musculoskeletal  Musculoskeletal (WDL) X  Assistive Device BSC  Generalized Weakness Yes  Weight Bearing Restrictions No  Gastrointestinal  Gastrointestinal (WDL) X  Abdomen Inspection Soft;Obese  Bowel Sounds Assessment Active  Tenderness Nontender  Last BM Date 08/26/16  Passing Flatus Yes  GI Symptoms None  Stool Characteristics  Bowel Incontinence No  GU Assessment  Genitourinary (WDL) X  Genitourinary Symptoms Urinary Catheter  Genitalia  Female Genitalia Intact  Urine Characteristics  Urinary Incontinence No  Urine Color Yellow/straw  Urine Appearance Clear  Urine Odor No odor  Urethral Catheter Adrienne RN 16 Fr.  Placement Date/Time: 08/26/16 2304   Perineal care performed prior to insertion?: Yes  Person Inserting Catheter: [place] with Catheter Insertion: Takera RN  Patient Location at Time of Insertion: 245  Tube Size (Fr.): 16 Fr.  Ca...  Indication for Insertion or Continuance of Catheter Aggressive IV diuresis  Site Assessment Clean;Intact  Catheter Maintenance Bag below level of bladder;Catheter secured;Drainage bag/tubing not touching floor;No dependent loops;Seal intact  Collection Container Standard drainage bag  Psychosocial  Psychosocial (WDL)  WDL

## 2016-08-27 NOTE — Care Management (Signed)
Transferred to 2A from icu stepdown. have reached out to [facility] to determine if still receiving SN PT OT and also wish to discuss home health plan of care for CHF.  It is documented that patient had a 16 pound weight gain since last discharge.  Wish to confirm daily weights.  [facility] referral made last admission and "the transition of care to be conducted by PCP and referral sent by PCP if need persists.  Unsure it this was done.  Does not appear she had a Heart Failure Clinic referral

## 2016-08-27 NOTE — Care Management (Signed)
Patient has presented to hospital 3 times over last 6 months. She was arranged for home O2 on last admission in May by [facility]. RNCM set patient up with [place] home health services. I have sent message to [facility] to let them know patient has returned.

## 2016-08-28 ENCOUNTER — Inpatient Hospital Stay: Payer: Medicare Other

## 2016-08-28 LAB — BASIC METABOLIC PANEL
Anion gap: 7 (ref 5–15)
BUN: 24 mg/dL — ABNORMAL HIGH (ref 6–20)
CO2: 33 mmol/L — AB (ref 22–32)
CREATININE: 0.69 mg/dL (ref 0.44–1.00)
Calcium: 8.3 mg/dL — ABNORMAL LOW (ref 8.9–10.3)
Chloride: 101 mmol/L (ref 101–111)
GFR calc non Af Amer: >60 mL/min (ref >60)
Glucose, Bld: 128 mg/dL — ABNORMAL HIGH (ref 65–99)
Potassium: 3.8 mmol/L (ref 3.5–5.1)
Sodium: 141 mmol/L (ref 135–145)

## 2016-08-28 LAB — URINALYSIS, COMPLETE (UACMP) WITH MICROSCOPIC
BACTERIA UA: NONE SEEN
Bilirubin Urine: NEGATIVE
Glucose, UA: 50 mg/dL — AB
Ketones, ur: NEGATIVE mg/dL
Nitrite: NEGATIVE
PROTEIN: 100 mg/dL — AB
Specific Gravity, Urine: 1.023 (ref 1.005–1.030)
pH: 5 (ref 5.0–8.0)

## 2016-08-28 LAB — HEPATIC FUNCTION PANEL
ALT: 15 U/L (ref 14–54)
AST: 25 U/L (ref 15–41)
Albumin: 3.4 g/dL — ABNORMAL LOW (ref 3.5–5.0)
Alkaline Phosphatase: 62 U/L (ref 38–126)
BILIRUBIN DIRECT: 0.3 mg/dL (ref 0.1–0.5)
BILIRUBIN TOTAL: 0.8 mg/dL (ref 0.3–1.2)
Indirect Bilirubin: 0.5 mg/dL (ref 0.3–0.9)
Total Protein: 6.4 g/dL — ABNORMAL LOW (ref 6.5–8.1)

## 2016-08-28 LAB — CBC
HCT: 35.4 % (ref 35.0–47.0)
HEMOGLOBIN: 11.6 g/dL — AB (ref 12.0–16.0)
MCH: 28.6 pg (ref 26.0–34.0)
MCHC: 32.7 g/dL (ref 32.0–36.0)
MCV: 87.7 fL (ref 80.0–100.0)
PLATELETS: 227 10*3/uL (ref 150–440)
RBC: 4.03 MIL/uL (ref 3.80–5.20)
RDW: 15.7 % — ABNORMAL HIGH (ref 11.5–14.5)
WBC: 10.6 10*3/uL (ref 3.6–11.0)

## 2016-08-28 LAB — PROTIME-INR
INR: 3.94
Prothrombin Time: 39.5 seconds — ABNORMAL HIGH (ref 11.4–15.2)

## 2016-08-28 LAB — GLUCOSE, CAPILLARY: Glucose-Capillary: 137 mg/dL — ABNORMAL HIGH (ref 65–99)

## 2016-08-28 MED ORDER — DEXTROSE 5 % IV SOLN
1.0000 g | INTRAVENOUS | Status: DC
  Administered 2016-08-28 – 2016-08-29 (×2): 1 g via INTRAVENOUS
  Filled 2016-08-28 (×3): qty 10

## 2016-08-28 MED ORDER — FUROSEMIDE 10 MG/ML IJ SOLN
40.0000 mg | Freq: Two times a day (BID) | INTRAMUSCULAR | Status: DC
  Administered 2016-08-29 (×2): 40 mg via INTRAVENOUS
  Filled 2016-08-28 (×2): qty 4

## 2016-08-28 MED ORDER — METOPROLOL TARTRATE 50 MG PO TABS
50.0000 mg | ORAL_TABLET | Freq: Two times a day (BID) | ORAL | Status: DC
  Administered 2016-08-28 – 2016-08-30 (×5): 50 mg via ORAL
  Filled 2016-08-28 (×5): qty 1

## 2016-08-28 NOTE — Progress Notes (Signed)
PT Cancellation Note  Patient Details Name: Hannah Faulkner MRN: [id number] DOB: Nov 05, 1940   Cancelled Treatment:    Reason Eval/Treat Not Completed: Patient declined, no reason specified Pt sleeping on arrival, family member in room.  She reports she does not feel like doing anything today as she had been nauseated and recently got something for that as well as a Xanex . Pt reports she does better in the morning anyway and wishes to wait until then when she is feeling better.   Kreg Shropshire, DPT 08/28/2016, 4:30 PM

## 2016-08-28 NOTE — Progress Notes (Signed)
DR Andrey Cota was made aware of pt HR of 37 with a 2/27 sec pulse. See new order , will continue to monitor

## 2016-08-28 NOTE — Progress Notes (Signed)
ANTICOAGULATION CONSULT NOTE - Initial Consult  Pharmacy Consult for warfarin Indication: atrial fibrillation  Allergies  Allergen Reactions  . Ampicillin   . Prinzide  [Lisinopril-Hydrochlorothiazide]   . Penicillins Rash    .Has patient had a PCN reaction causing immediate rash, facial/tongue/throat swelling, SOB or lightheadedness with hypotension: Unknown Has patient had a PCN reaction causing severe rash involving mucus membranes or skin necrosis: No Has patient had a PCN reaction that required hospitalization: No Has patient had a PCN reaction occurring within the last 10 years: No If all of the above answers are "NO", then may proceed with Cephalosporin use.     Patient Measurements: Height: 5\' 2"  (157.5 cm) Weight: 206 lb 3.2 oz (93.5 kg) IBW/kg (Calculated) : 50.1 Heparin Dosing Weight:   Vital Signs: Temp: 98.2 F (36.8 C) (06/07 0912) Temp Source: Oral (06/07 0912) BP: 114/87 (06/07 0912) Pulse Rate: 73 (06/07 0912)  Labs:  Recent Labs  08/26/16 1213 08/27/16 0318 08/28/16 0420  HGB 12.6 11.6* 11.6*  HCT 38.0 35.7 35.4  PLT 284 243 227  LABPROT 31.4* 34.7* 39.5*  INR 2.95 3.35 3.94  CREATININE 0.76 0.79 0.69  TROPONINI <0.03  --   --     Estimated Creatinine Clearance: 63.8 mL/min (by C-G formula based on SCr of 0.69 mg/dL).   Medical History: Past Medical History:  Diagnosis Date  . A-fib ([place])   . CHF (congestive heart failure) ([place])   . Hypertension     Medications:  Infusions:    Assessment: 76 yof cc SOB/leg swelling on VKA for AF, INR on admission 2.95. Sent here from cardiologist's office for diuresis d/t acute on chronic diastolic heart failure. Pharmacy consulted to manage VKA while admitted. Home regimen: warfarin 2 mg po every other day alternate with warfarin 2.5 mg po every other day. Per med rec patient took 2.5mg  on 6/4  Goal of Therapy:  INR 2-3 Monitor platelets by anticoagulation protocol: Yes   Plan:  INR  supratherapeutic at 3.94. Hold warfarin tonight/. Recheck INR in the AM.  Daily INR while IP.  Ramond Dial, Pharm.D., BCPS Clinical Pharmacist 08/28/2016,11:03 AM

## 2016-08-28 NOTE — Care Management (Addendum)
Spoke with [facility].  Patient was compliant with treatment regimen, diet and daily weights.  A 3.3 pound weight gain was identified and reported to physician without change in treatment.  CM has request PT and OT consults.  Heart Failure Clinic appt has been made and [facility] looking into referral from last admission

## 2016-08-28 NOTE — Consult Note (Signed)
[facility] Cardiology  CARDIOLOGY CONSULT NOTE  Patient ID: Hannah Faulkner MRN: [id number] DOB/AGE: Oct 04, 1940 76 y.o.  Admit date: 08/26/2016 Referring Physician Vianne Bulls Primary Physician Rosanna Randy Primary Cardiologist Omeka Holben Reason for Consultation Congestive heart failure  HPI: 76 year old female referred for evaluation of congestive heart failure. The patient was recently hospitalized 08/04/2016 with community acquired pneumonia, congestive heart failure and atrial fibrillation with a rapid ventricular rate. Since discharge, she reports persistent exertional dyspnea, shortness of breath, and worsening peripheral edema. Despite outpatient diuretic therapy. The patient has retained fluid, experienced 6 pound weight gain with worsening pedal edema and orthopnea. The patient was admitted on 08/26/2016 for intravenous furosemide, with modest diuresis and overall clinical improvement. Recent 2-D echocardiogram 08/07/2016 revealed LVEF of 60-65%. The patient has a history of paroxysmal atrial fibrillation, chads Vascor 3, on warfarin for stroke prevention.  Review of systems complete and found to be negative unless listed above     Past Medical History:  Diagnosis Date  . A-fib ([place])   . CHF (congestive heart failure) ([place])   . Hypertension     Past Surgical History:  Procedure Laterality Date  . EYE SURGERY    . POLYPECTOMY     colon poly premoved    Prescriptions Prior to Admission  Medication Sig Dispense Refill Last Dose  . ALPRAZolam (XANAX) 0.5 MG tablet TAKE 1 TABLET BY MOUTH AT BEDTIME AS NEEDED FOR ANXIETY (Patient taking differently: TAKE 1/2 TABLET BY MOUTH AT BEDTIME AS NEEDED FOR ANXIETY) 30 tablet 5 08/25/2016 at 2000  . cyanocobalamin 1000 MCG tablet Take 1,000 mcg by mouth daily.   08/25/2016 at 0800  . diltiazem (CARDIZEM) 120 MG tablet Take 120 mg by mouth 2 times daily at 12 noon and 4 pm.    08/25/2016 at 1600  . flecainide (TAMBOCOR) 50 MG tablet Take 50 mg by mouth 2 (two) times  daily.    08/25/2016 at 2000  . furosemide (LASIX) 40 MG tablet Take 1 tablet (40 mg total) by mouth daily. 30 tablet 0 08/25/2016 at 0800  . gabapentin (NEURONTIN) 100 MG capsule Take 2 capsules (200 mg total) by mouth 3 (three) times daily. (Patient taking differently: Take 100 mg by mouth 3 (three) times daily. ) 540 capsule 3 08/25/2016 at 2000  . hydrALAZINE (APRESOLINE) 25 MG tablet TAKE 1 TABLET BY MOUTH 3 TIMES A DAY (Patient taking differently: TAKE 1 TABLET BY MOUTH TWICE DAILY) 90 tablet 3 08/25/2016 at 2000  . metoprolol (LOPRESSOR) 50 MG tablet Take 100 mg by mouth 2 (two) times daily.    08/25/2016 at 2000  . potassium chloride SA (K-DUR,KLOR-CON) 20 MEQ tablet Take 1 tablet (20 mEq total) by mouth daily. (Patient taking differently: Take 30 mEq by mouth daily. ) 30 tablet 0 08/26/2016 at 0800  . PROAIR HFA 108 (90 Base) MCG/ACT inhaler INHALE 2 INHALATIONS INTO THE LUNGS EVERY 6 (SIX) HOURS AS NEEDED FOR WHEEZING.  4 PRN at PRN  . Pyridoxine HCl (VITAMIN B6) 200 MG TABS Take 200 tablets by mouth 2 (two) times daily.    08/25/2016 at 2000  . warfarin (COUMADIN) 2 MG tablet Take 2 mg by mouth every other day. Take 2mg  one day and take 2.5mg  next day, alternating.   08/24/2016 at 2000  . warfarin (COUMADIN) 2.5 MG tablet Take 2.5 mg by mouth every other day. Take 2mg  one day and take 2.5mg  next day, alternating.   08/25/2016 at [place] History  . Marital  status: Widowed    Spouse name: N/A  . Number of children: 3  . Years of education: HS   Occupational History  .  Retired   Social History Main Topics  . Smoking status: Never Smoker  . Smokeless tobacco: Never Used  . Alcohol use No  . Drug use: No  . Sexual activity: Not on file   Other Topics Concern  . Not on file   Social History Narrative  . No narrative on file    Family History  Problem Relation Age of Onset  . Hypertension Mother   . Heart disease Mother   . CVA Mother   . Heart attack Mother   .  Lung cancer Father   . Heart attack Sister   . CVA Sister   . Thyroid disease Sister   . Liver cancer Brother   . Thyroid disease Sister   . Thyroid disease Sister   . Diabetes Sister   . Dementia Sister   . Atrial fibrillation Sister       Review of systems complete and found to be negative unless listed above      PHYSICAL EXAM  General: Well developed, well nourished, in no acute distress HEENT:  Normocephalic and atramatic Neck:  No JVD.  Lungs: Clear bilaterally to auscultation and percussion. Heart: HRRR . Normal S1 and S2 without gallops or murmurs.  Abdomen: Bowel sounds are positive, abdomen soft and non-tender  Msk:  Back normal, normal gait. Normal strength and tone for age. Extremities: No clubbing, cyanosis or edema.   Neuro: Alert and oriented X 3. Psych:  Good affect, responds appropriately  Labs:   Lab Results  Component Value Date   WBC 10.6 08/28/2016   HGB 11.6 (L) 08/28/2016   HCT 35.4 08/28/2016   MCV 87.7 08/28/2016   PLT 227 08/28/2016    Recent Labs Lab 08/28/16 0420  NA 141  K 3.8  CL 101  CO2 33*  BUN 24*  CREATININE 0.69  CALCIUM 8.3*  PROT 6.4*  BILITOT 0.8  ALKPHOS 62  ALT 15  AST 25  GLUCOSE 128*   Lab Results  Component Value Date   TROPONINI <0.03 08/26/2016    Lab Results  Component Value Date   CHOL 148 11/27/2015   CHOL 164 02/22/2015   CHOL 169 09/13/2013   Lab Results  Component Value Date   HDL 42 11/27/2015   HDL 49 02/22/2015   HDL 46 09/13/2013   Lab Results  Component Value Date   LDLCALC 84 11/27/2015   LDLCALC 93 02/22/2015   LDLCALC 99 09/13/2013   Lab Results  Component Value Date   TRIG 109 11/27/2015   TRIG 112 02/22/2015   TRIG 122 09/13/2013   Lab Results  Component Value Date   CHOLHDL 3.5 11/27/2015   No results found for: LDLDIRECT    Radiology: Dg Chest 2 View  Result Date: 08/28/2016 CLINICAL DATA:  76 year old female with lower extremity edema and shortness of breath.  EXAM: CHEST  2 VIEW COMPARISON:  08/26/2016 and earlier. FINDINGS: Seated AP and lateral views of the chest. No significant improvement in bibasilar ventilation since 08/26/2016. Streaky and confluent bibasilar opacity. Small pleural effusion suspected on the right. Small volume fluid tracking into the right minor fissure. No acute edema. No pneumothorax. Stable visible mediastinal contours. Visualized tracheal air column is within normal limits. No acute osseous abnormality identified. Negative visible bowel gas pattern. IMPRESSION: No significant improvement in ventilation since 08/26/2016. Basilar predominant  bilateral pulmonary opacity could reflect atelectasis, but superimposed lung base infection is difficult to exclude. There is no overt pulmonary edema. Small right pleural effusion partially tracking into the minor fissure. Electronically Signed   By: Genevie Ann M.D.   On: 08/28/2016 08:45   Dg Chest 2 View  Result Date: 08/26/2016 CLINICAL DATA:  Shortness of breath, bilateral lower extremity swelling EXAM: CHEST  2 VIEW COMPARISON:  08/12/2016 FINDINGS: Perihilar and bilateral lower lobe opacities, favored to reflect mild interstitial edema. Superimposed lower lobe atelectasis and/or pneumonia is possible. Small bilateral pleural effusions, right greater than left. No pneumothorax. Cardiomegaly. Mild degenerative changes of the visualized thoracolumbar spine. IMPRESSION: Cardiomegaly with suspected mild interstitial edema and small bilateral pleural effusions, right greater than left. Superimposed lower lobe atelectasis and/or pneumonia is possible. Electronically Signed   By: Julian Hy M.D.   On: 08/26/2016 13:35   Dg Chest 2 View  Result Date: 08/12/2016 CLINICAL DATA:  CHF. EXAM: CHEST  2 VIEW COMPARISON:  CT 08/06/2016.  Chest x-ray 08/04/2016 . FINDINGS: Cardiomegaly with pulmonary vascular prominence and bilateral interstitial prominence with small bilateral pleural effusions. Lung  volumes with prominent basilar atelectasis. Bibasilar pneumonia cannot be excluded . No pneumothorax. IMPRESSION: 1. Cardiomegaly with pulmonary vascular prominence and bilateral interstitial prominence and bilateral pleural effusions. Findings consistent CHF. 2. Low lung volumes with prominent bibasilar atelectasis. Bibasilar pneumonia cannot be excluded . Electronically Signed   By: Marcello Moores  Register   On: 08/12/2016 11:38   Dg Chest 2 View  Result Date: 08/04/2016 CLINICAL DATA:  76 year old female with increased weakness and shortness of breath for 2 weeks. Uncontrolled atrial fibrillation. EXAM: CHEST  2 VIEW COMPARISON:  07/28/2016 and earlier. FINDINGS: Streaky and patchy bibasilar pulmonary opacity with mild progression since 07/28/2016. Stable cardiomegaly and mediastinal contours. No pneumothorax. Pulmonary vascularity appears stable. No definite pleural effusion. Negative visible bowel gas pattern. No acute osseous abnormality identified. Visualized tracheal air column is within normal limits. IMPRESSION: 1. Interval progression of Patchy and confluent bibasilar opacity. This is nonspecific but consider bilateral pneumonia or aspiration. 2. Stable cardiomegaly. No acute pulmonary edema, and no pleural effusion identified. Electronically Signed   By: Genevie Ann M.D.   On: 08/04/2016 17:18   Ct Chest Wo Contrast  Result Date: 08/06/2016 CLINICAL DATA:  Pneumonia.  Cough and shortness of breath. EXAM: CT CHEST WITHOUT CONTRAST TECHNIQUE: Multidetector CT imaging of the chest was performed following the standard protocol without IV contrast. COMPARISON:  Chest radiographs 08/04/2016 FINDINGS: Cardiovascular: Normal caliber of the thoracic aorta with mild atherosclerotic calcification. Mild enlargement of the main pulmonary artery may reflect underlying pulmonary arterial hypertension. Mild coronary artery calcification. Mild cardiomegaly. No pericardial effusion. Mediastinum/Nodes: There is mild nodular  enlargement of the left thyroid lobe and isthmus, with an approximately 1.6 cm nodule extending from the isthmus and with a left lower pole nodule difficult to discretely measure. There is an increased number of small lymph nodes throughout the mediastinum measuring up to 1.1 cm in short axis, likely reactive. No axillary or definite hilar lymphadenopathy is identified on this unenhanced study. Unremarkable esophagus. Lungs/Pleura: There are small pleural effusions, right larger than left. Dense consolidation is present in the right lower lobe with air bronchograms. There are patchy areas of consolidation in the left lower lobe, right middle lobe, and lingula. Minimal nodular opacity is present anteriorly in the right upper lobe. Mosaic attenuation of both upper lobes may reflect mosaic perfusion of vascular origin given evidence of pulmonary hypertension.  Upper Abdomen: There is prominence of the lateral segment of the left hepatic lobe which demonstrates a mildly nodular contour. There may also be a recanalized paraumbilical vein. Musculoskeletal: Moderate thoracic disc degeneration. No suspicious osseous lesion. IMPRESSION: 1. Dense right lower lobe consolidation with patchy consolidation elsewhere in both lungs compatible with pneumonia. 2. Small pleural effusions, right larger than left. 3. Findings suggesting pulmonary arterial hypertension. 4. Morphologic liver changes which may reflect underlying cirrhosis. 5.  Aortic Atherosclerosis (ICD10-I70.0). Electronically Signed   By: Logan Bores M.D.   On: 08/06/2016 09:46   Dg Chest Port 1 View  Result Date: 08/26/2016 CLINICAL DATA:  Dyspnea and cough EXAM: PORTABLE CHEST 1 VIEW COMPARISON:  08/26/2016 FINDINGS: Low lung volumes with stable cardiomegaly. The aorta is difficult to visualize. Small bilateral pleural effusions right greater than left with pulmonary edema and fluid in the right minor fissure, largely unchanged in appearance. Bibasilar atelectasis  and/or infiltrates. No acute osseous abnormality. IMPRESSION: Stable cardiomegaly with interstitial edema and right greater than left pleural effusions. Cannot exclude bibasilar atelectasis and/or pneumonia. Electronically Signed   By: Ashley Royalty M.D.   On: 08/26/2016 22:13    EKG: Atrial fibrillation at a rate of 110 bpm  ASSESSMENT AND PLAN:   1. Acute on chronic diastolic congestive heart failure in patient with known normal coronary anatomy, normal left ventricular function, likely exacerbated by recent community acquired pneumonia 2. Paroxysmal atrial fibrillation, currently in atrial fibrillation with elevation and ventricular rate, asymptomatic, on methadone pressure prevention  Recommendations  1. Agree with overall current therapy 2. Continue diuresis 3. Carefully monitor renal status 4. Continue warfarin for stroke prevention  Signed: Isaias Cowman MD,PhD, [facility] 08/28/2016, 1:29 PM

## 2016-08-28 NOTE — Progress Notes (Signed)
Initial HF Clinic appointment scheduled for September 04, 2016 at 11:00am. Thank you for the referral.

## 2016-08-28 NOTE — Progress Notes (Signed)
[place] [facility] [place] NAME: Stephanye Finnicum    MR#:  [id number]  DATE OF BIRTH:  [birth year]  SUBJECTIVE:  CHIEF COMPLAINT:   Chief Complaint  Patient presents with  . Shortness of Breath  . Leg Swelling    recent d/c with CHF, gained 16 Lb weight in last 3 days came with SOB, feels slightly better today, not enough diuresis yet. Drinks a lot of liquids at home. Had complain of urinary frequency and some obstruction yesterday so Foley catheter is placed. Not much urine output yet and urine appears dark.  REVIEW OF SYSTEMS:  CONSTITUTIONAL: No fever, fatigue or weakness.  EYES: No blurred or double vision.  EARS, NOSE, AND THROAT: No tinnitus or ear pain.  RESPIRATORY: No cough, positive for shortness of breath, no wheezing or hemoptysis.  CARDIOVASCULAR: No chest pain, orthopnea, edema.  GASTROINTESTINAL: No nausea, vomiting, diarrhea or abdominal pain.  GENITOURINARY: No dysuria, hematuria.  ENDOCRINE: No polyuria, nocturia,  HEMATOLOGY: No anemia, easy bruising or bleeding SKIN: No rash or lesion. MUSCULOSKELETAL: No joint pain or arthritis.   NEUROLOGIC: No tingling, numbness, weakness.  PSYCHIATRY: No anxiety or depression.   ROS  DRUG ALLERGIES:   Allergies  Allergen Reactions  . Ampicillin   . Prinzide  [Lisinopril-Hydrochlorothiazide]   . Penicillins Rash    .Has patient had a PCN reaction causing immediate rash, facial/tongue/throat swelling, SOB or lightheadedness with hypotension: Unknown Has patient had a PCN reaction causing severe rash involving mucus membranes or skin necrosis: No Has patient had a PCN reaction that required hospitalization: No Has patient had a PCN reaction occurring within the last 10 years: No If all of the above answers are "NO", then may proceed with Cephalosporin use.     VITALS:  Blood pressure 114/87, pulse 73, temperature 98.2 F (36.8 C), temperature source Oral, resp. rate 20, height 5\' 2"   (1.575 m), weight 93.5 kg (206 lb 3.2 oz), SpO2 93 %.  PHYSICAL EXAMINATION:   GENERAL:  76 y.o.-year-old patient lying in the bed with no acute distress.  EYES: Pupils equal, round, reactive to light . No scleral icterus. Extraocular muscles intact.  HEENT: Head atraumatic, normocephalic. Oropharynx and nasopharynx clear.  NECK:  Supple, no jugular venous distention. No thyroid enlargement, no tenderness.  LUNGS:Basilar crepitations , some wheezing CARDIOVASCULAR: S1, S2 normal. No murmurs, rubs, or gallops.  ABDOMEN: Soft, nontender, nondistended. Bowel sounds present. No organomegaly or mass.foley in place.  EXTREMITIES: she have pedal edema,no cyanosis, or clubbing.  NEUROLOGIC: Cranial nerves II through XII are intact. Muscle strength 4/5 in all extremities. Sensation intact. Gait not checked.  PSYCHIATRIC: The patient is alert and oriented x 3.  SKIN: No obvious rash, lesion, or ulcer.   Physical Exam LABORATORY PANEL:   CBC  Recent Labs Lab 08/28/16 0420  WBC 10.6  HGB 11.6*  HCT 35.4  PLT 227   ------------------------------------------------------------------------------------------------------------------  Chemistries   Recent Labs Lab 08/26/16 1213  08/28/16 0420  NA 139  < > 141  K 4.7  < > 3.8  CL 101  < > 101  CO2 31  < > 33*  GLUCOSE 117*  < > 128*  BUN 22*  < > 24*  CREATININE 0.76  < > 0.69  CALCIUM 8.8*  < > 8.3*  MG 2.5*  --   --   AST  --   --  25  ALT  --   --  15  ALKPHOS  --   --  62  BILITOT  --   --  0.8  < > = values in this interval not displayed. ------------------------------------------------------------------------------------------------------------------  Cardiac Enzymes  Recent Labs Lab 08/26/16 1213  TROPONINI <0.03   ------------------------------------------------------------------------------------------------------------------  RADIOLOGY:  Dg Chest 2 View  Result Date: 08/28/2016 CLINICAL DATA:  76 year old female  with lower extremity edema and shortness of breath. EXAM: CHEST  2 VIEW COMPARISON:  08/26/2016 and earlier. FINDINGS: Seated AP and lateral views of the chest. No significant improvement in bibasilar ventilation since 08/26/2016. Streaky and confluent bibasilar opacity. Small pleural effusion suspected on the right. Small volume fluid tracking into the right minor fissure. No acute edema. No pneumothorax. Stable visible mediastinal contours. Visualized tracheal air column is within normal limits. No acute osseous abnormality identified. Negative visible bowel gas pattern. IMPRESSION: No significant improvement in ventilation since 08/26/2016. Basilar predominant bilateral pulmonary opacity could reflect atelectasis, but superimposed lung base infection is difficult to exclude. There is no overt pulmonary edema. Small right pleural effusion partially tracking into the minor fissure. Electronically Signed   By: Genevie Ann M.D.   On: 08/28/2016 08:45   Dg Chest Port 1 View  Result Date: 08/26/2016 CLINICAL DATA:  Dyspnea and cough EXAM: PORTABLE CHEST 1 VIEW COMPARISON:  08/26/2016 FINDINGS: Low lung volumes with stable cardiomegaly. The aorta is difficult to visualize. Small bilateral pleural effusions right greater than left with pulmonary edema and fluid in the right minor fissure, largely unchanged in appearance. Bibasilar atelectasis and/or infiltrates. No acute osseous abnormality. IMPRESSION: Stable cardiomegaly with interstitial edema and right greater than left pleural effusions. Cannot exclude bibasilar atelectasis and/or pneumonia. Electronically Signed   By: Ashley Royalty M.D.   On: 08/26/2016 22:13    ASSESSMENT AND PLAN:   Active Problems:   Acute diastolic CHF (congestive heart failure) (HCC)   #1 acute on chronic  diastolic heart failure, recent ejection fraction more than 60% by echocardiogram 2 weeks ago.   IV Lasix 60 mg every 12, check daily weights, continue low-salt diet. Appreciated  cardiology consult   Councelled about fluid restriction. #2 chronic A. fib: Patient is in A. fib with heart rate in 110s but it is slightly high because of CHF. Continue,  metoprolol, Cardizem. Continue coumadin,   Pharmacy to adjust the dose. #3. Hypertension: Controlled #4 .anxiety #5 UTI    Rocephin, get Ur cx.   All the records are reviewed and case discussed with Care Management/Social Workerr. Management plans discussed with the patient, family and they are in agreement.  CODE STATUS: Full.  TOTAL TIME TAKING CARE OF THIS PATIENT: 35 minutes.    POSSIBLE D/C IN 1-2 DAYS, DEPENDING ON CLINICAL CONDITION.   Vaughan Basta M.D on 08/28/2016   Between 7am to 6pm - Pager - [phone]  After 6pm go to [email or web address] - password [email or web address] [facility]  Office  [phone]  CC: Primary care physician; Jerrol Banana., MD  Note: This dictation was prepared with Dragon dictation along with smaller phrase technology. Any transcriptional errors that result from this process are unintentional.

## 2016-08-29 ENCOUNTER — Encounter: Payer: Self-pay | Admitting: *Deleted

## 2016-08-29 LAB — BASIC METABOLIC PANEL
Anion gap: 6 (ref 5–15)
BUN: 23 mg/dL — ABNORMAL HIGH (ref 6–20)
CALCIUM: 8.4 mg/dL — AB (ref 8.9–10.3)
CO2: 34 mmol/L — AB (ref 22–32)
Chloride: 99 mmol/L — ABNORMAL LOW (ref 101–111)
Creatinine, Ser: 0.66 mg/dL (ref 0.44–1.00)
Glucose, Bld: 122 mg/dL — ABNORMAL HIGH (ref 65–99)
Potassium: 4 mmol/L (ref 3.5–5.1)
Sodium: 139 mmol/L (ref 135–145)

## 2016-08-29 LAB — GLUCOSE, CAPILLARY: Glucose-Capillary: 97 mg/dL (ref 65–99)

## 2016-08-29 LAB — PROTIME-INR
INR: 3.53
PROTHROMBIN TIME: 36.2 s — AB (ref 11.4–15.2)

## 2016-08-29 NOTE — Progress Notes (Signed)
ANTICOAGULATION CONSULT NOTE - Initial Consult  Pharmacy Consult for warfarin Indication: atrial fibrillation  Allergies  Allergen Reactions  . Ampicillin   . Prinzide  [Lisinopril-Hydrochlorothiazide]   . Penicillins Rash    .Has patient had a PCN reaction causing immediate rash, facial/tongue/throat swelling, SOB or lightheadedness with hypotension: Unknown Has patient had a PCN reaction causing severe rash involving mucus membranes or skin necrosis: No Has patient had a PCN reaction that required hospitalization: No Has patient had a PCN reaction occurring within the last 10 years: No If all of the above answers are "NO", then may proceed with Cephalosporin use.     Patient Measurements: Height: 5\' 2"  (157.5 cm) Weight: 206 lb 11.2 oz (93.8 kg) IBW/kg (Calculated) : 50.1 Heparin Dosing Weight:   Vital Signs: Temp: 98.9 F (37.2 C) (06/08 0517) Temp Source: Oral (06/08 0517) BP: 102/55 (06/08 0517) Pulse Rate: 65 (06/08 0517)  Labs:  Recent Labs  08/26/16 1213 08/27/16 0318 08/28/16 0420 08/29/16 0528  HGB 12.6 11.6* 11.6*  --   HCT 38.0 35.7 35.4  --   PLT 284 243 227  --   LABPROT 31.4* 34.7* 39.5* 36.2*  INR 2.95 3.35 3.94 3.53  CREATININE 0.76 0.79 0.69 0.66  TROPONINI <0.03  --   --   --     Estimated Creatinine Clearance: 63.8 mL/min (by C-G formula based on SCr of 0.66 mg/dL).   Medical History: Past Medical History:  Diagnosis Date  . A-fib ([place])   . CHF (congestive heart failure) ([place])   . Hypertension     Medications:  Infusions:  . cefTRIAXone (ROCEPHIN)  IV Stopped (08/28/16 1622)    Assessment: 76 yof cc SOB/leg swelling on VKA for AF, INR on admission 2.95. Sent here from cardiologist's office for diuresis d/t acute on chronic diastolic heart failure. Pharmacy consulted to manage VKA while admitted. Home regimen: warfarin 2 mg po every other day alternate with warfarin 2.5 mg po every other day. Per med rec patient took 2.5mg  on  6/4  6/5 INR 2.95  Warfarin 2.5 6/6 INR 3.35  Warfarin 1mg  6/7 INR 3.94  Held 6/8 INR 3.53  Goal of Therapy:  INR 2-3 Monitor platelets by anticoagulation protocol: Yes   Plan:  INR is decreasing, still a little supratherapeutic. Will hold one more day and then resume warfarin at a lower daily dose of 2mg  as pt was also supratherapeutic last admission on same home dose.  Ramond Dial, Pharm.D., BCPS Clinical Pharmacist 08/29/2016,7:30 AM

## 2016-08-29 NOTE — Care Management Important Message (Signed)
Important Message  Patient Details  Name: Hannah Faulkner MRN: [id number] Date of Birth: January 15, 1941   Medicare Important Message Given:  Yes Signed IM notice given   Katrina Stack, RN 08/29/2016, 3:28 PM

## 2016-08-29 NOTE — Progress Notes (Signed)
[facility] Cardiology  SUBJECTIVE: I'm feeling better   Vitals:   08/28/16 0912 08/28/16 2007 08/29/16 0500 08/29/16 0517  BP: 114/87 124/72  (!) 102/55  Pulse: 73 (!) 125  65  Resp: 20 20  18   Temp: 98.2 F (36.8 C) 98.4 F (36.9 C)  98.9 F (37.2 C)  TempSrc: Oral Oral  Oral  SpO2: 93% 92%  93%  Weight:   93.8 kg (206 lb 11.2 oz)   Height:         Intake/Output Summary (Last 24 hours) at 08/29/16 0815 Last data filed at 08/29/16 5462  Gross per 24 hour  Intake              770 ml  Output              745 ml  Net               25 ml      PHYSICAL EXAM  General: Well developed, well nourished, in no acute distress HEENT:  Normocephalic and atramatic Neck:  No JVD.  Lungs: Clear bilaterally to auscultation and percussion. Heart: HRRR . Normal S1 and S2 without gallops or murmurs.  Abdomen: Bowel sounds are positive, abdomen soft and non-tender  Msk:  Back normal, normal gait. Normal strength and tone for age. Extremities: No clubbing, cyanosis or edema.   Neuro: Alert and oriented X 3. Psych:  Good affect, responds appropriately   LABS: Basic Metabolic Panel:  Recent Labs  08/26/16 1213  08/28/16 0420 08/29/16 0528  NA 139  < > 141 139  K 4.7  < > 3.8 4.0  CL 101  < > 101 99*  CO2 31  < > 33* 34*  GLUCOSE 117*  < > 128* 122*  BUN 22*  < > 24* 23*  CREATININE 0.76  < > 0.69 0.66  CALCIUM 8.8*  < > 8.3* 8.4*  MG 2.5*  --   --   --   < > = values in this interval not displayed. Liver Function Tests:  Recent Labs  08/28/16 0420  AST 25  ALT 15  ALKPHOS 62  BILITOT 0.8  PROT 6.4*  ALBUMIN 3.4*   No results for input(s): LIPASE, AMYLASE in the last 72 hours. CBC:  Recent Labs  08/27/16 0318 08/28/16 0420  WBC 13.1* 10.6  HGB 11.6* 11.6*  HCT 35.7 35.4  MCV 85.6 87.7  PLT 243 227   Cardiac Enzymes:  Recent Labs  08/26/16 1213  TROPONINI <0.03   BNP: Invalid input(s): POCBNP D-Dimer: No results for input(s): DDIMER in the last 72  hours. Hemoglobin A1C: No results for input(s): HGBA1C in the last 72 hours. Fasting Lipid Panel: No results for input(s): CHOL, HDL, LDLCALC, TRIG, CHOLHDL, LDLDIRECT in the last 72 hours. Thyroid Function Tests: No results for input(s): TSH, T4TOTAL, T3FREE, THYROIDAB in the last 72 hours.  Invalid input(s): FREET3 Anemia Panel: No results for input(s): VITAMINB12, FOLATE, FERRITIN, TIBC, IRON, RETICCTPCT in the last 72 hours.  Dg Chest 2 View  Result Date: 08/28/2016 CLINICAL DATA:  76 year old female with lower extremity edema and shortness of breath. EXAM: CHEST  2 VIEW COMPARISON:  08/26/2016 and earlier. FINDINGS: Seated AP and lateral views of the chest. No significant improvement in bibasilar ventilation since 08/26/2016. Streaky and confluent bibasilar opacity. Small pleural effusion suspected on the right. Small volume fluid tracking into the right minor fissure. No acute edema. No pneumothorax. Stable visible mediastinal contours. Visualized tracheal air column  is within normal limits. No acute osseous abnormality identified. Negative visible bowel gas pattern. IMPRESSION: No significant improvement in ventilation since 08/26/2016. Basilar predominant bilateral pulmonary opacity could reflect atelectasis, but superimposed lung base infection is difficult to exclude. There is no overt pulmonary edema. Small right pleural effusion partially tracking into the minor fissure. Electronically Signed   By: Genevie Ann M.D.   On: 08/28/2016 08:45     Echo LV EF 60-65%  TELEMETRY: Atrial fibrillation at a rate of 90 bpm:  ASSESSMENT AND PLAN:  Active Problems:   Acute diastolic CHF (congestive heart failure) ([place])    1. Acute on chronic diastolic CHF, with known normal coronary anatomy, normal left ventricular function, likely exacerbated by recent community acquired pneumonia, improved after diuresis 2. Paroxysmal atrial fibrillation, currently in atrial fibrillation with better control of  ventricular rate  Recommendations  1. Continue current therapy 2. Continue diuresis 3. Carefully monitor renal status 4. Continue warfarin for stroke prevention, target INR 2.0-3.0 5. CHF clinic as outpatient   Isaias Cowman, MD, PhD, [facility] 08/29/2016 8:15 AM

## 2016-08-29 NOTE — Discharge Instructions (Signed)
Heart Failure Clinic appointment on September 04, 2016 at 11:00am with Darylene Price, [place]. Please call [phone] to reschedule.   Information on my medicine - Coumadin   (Warfarin)  This medication education was reviewed with me or my healthcare representative as part of my discharge preparation.  The pharmacist that spoke with me during my hospital stay was:  Ramond Dial, [facility]  Why was Coumadin prescribed for you? Coumadin was prescribed for you because you have a blood clot or a medical condition that can cause an increased risk of forming blood clots. Blood clots can cause serious health problems by blocking the flow of blood to the heart, lung, or brain. Coumadin can prevent harmful blood clots from forming. As a reminder your indication for Coumadin is:   Stroke Prevention Because Of Atrial Fibrillation  What test will check on my response to Coumadin? While on Coumadin (warfarin) you will need to have an INR test regularly to ensure that your dose is keeping you in the desired range. The INR (international normalized ratio) number is calculated from the result of the laboratory test called prothrombin time (PT).  If an INR APPOINTMENT HAS NOT ALREADY BEEN MADE FOR YOU please schedule an appointment to have this lab work done by your health care provider within 7 days. Your INR goal is usually a number between:  2 to 3 or your provider may give you a more narrow range like 2-2.5.  Ask your health care provider during an office visit what your goal INR is.  What  do you need to  know  About  COUMADIN? Take Coumadin (warfarin) exactly as prescribed by your healthcare provider about the same time each day.  DO NOT stop taking without talking to the doctor who prescribed the medication.  Stopping without other blood clot prevention medication to take the place of Coumadin may increase your risk of developing a new clot or stroke.  Get refills before you run out.  What do you do if you miss a  dose? If you miss a dose, take it as soon as you remember on the same day then continue your regularly scheduled regimen the next day.  Do not take two doses of Coumadin at the same time.  Important Safety Information A possible side effect of Coumadin (Warfarin) is an increased risk of bleeding. You should call your healthcare provider right away if you experience any of the following: ? Bleeding from an injury or your nose that does not stop. ? Unusual colored urine (red or dark brown) or unusual colored stools (red or black). ? Unusual bruising for unknown reasons. ? A serious fall or if you hit your head (even if there is no bleeding).  Some foods or medicines interact with Coumadin (warfarin) and might alter your response to warfarin. To help avoid this: ? Eat a balanced diet, maintaining a consistent amount of Vitamin K. ? Notify your provider about major diet changes you plan to make. ? Avoid alcohol or limit your intake to 1 drink for women and 2 drinks for men per day. (1 drink is 5 oz. wine, 12 oz. beer, or 1.5 oz. liquor.)  Make sure that ANY health care provider who prescribes medication for you knows that you are taking Coumadin (warfarin).  Also make sure the healthcare provider who is monitoring your Coumadin knows when you have started a new medication including herbals and non-prescription products.  Coumadin (Warfarin)  Major Drug Interactions  Increased Warfarin Effect Decreased  Warfarin Effect  Alcohol (large quantities) Antibiotics (esp. Septra/Bactrim, Flagyl, Cipro) Amiodarone (Cordarone) Aspirin (ASA) Cimetidine (Tagamet) Megestrol (Megace) NSAIDs (ibuprofen, naproxen, etc.) Piroxicam (Feldene) Propafenone (Rythmol SR) Propranolol (Inderal) Isoniazid (INH) Posaconazole (Noxafil) Barbiturates (Phenobarbital) Carbamazepine (Tegretol) Chlordiazepoxide (Librium) Cholestyramine (Questran) Griseofulvin Oral Contraceptives Rifampin Sucralfate  (Carafate) Vitamin K   Coumadin (Warfarin) Major Herbal Interactions  Increased Warfarin Effect Decreased Warfarin Effect  Garlic Ginseng Ginkgo biloba Coenzyme Q10 Green tea St. Johns wort    Coumadin (Warfarin) FOOD Interactions  Eat a consistent number of servings per week of foods HIGH in Vitamin K (1 serving =  cup)  Collards (cooked, or boiled & drained) Kale (cooked, or boiled & drained) Mustard greens (cooked, or boiled & drained) Parsley *serving size only =  cup Spinach (cooked, or boiled & drained) Swiss chard (cooked, or boiled & drained) Turnip greens (cooked, or boiled & drained)  Eat a consistent number of servings per week of foods MEDIUM-HIGH in Vitamin K (1 serving = 1 cup)  Asparagus (cooked, or boiled & drained) Broccoli (cooked, boiled & drained, or raw & chopped) Brussel sprouts (cooked, or boiled & drained) *serving size only =  cup Lettuce, raw (green leaf, endive, romaine) Spinach, raw Turnip greens, raw & chopped   These websites have more information on Coumadin (warfarin):  FailFactory.se; VeganReport.com.au;

## 2016-08-29 NOTE — Evaluation (Signed)
Physical Therapy Evaluation Patient Details Name: Hannah Faulkner MRN: [id number] DOB: Aug 01, 1940 Today's Date: 08/29/2016   History of Present Illness  76yo female, who was here 5/14 w/ DOE/cough/fever, admitted for PNA, she is now here with acute CHF. PMH: afib, HTN, GAD, depression, DM2, HL:D, OSA. Patient lives in single family home with grandson, who assists with transportation as needed. The patient performs household AMB using furniture for stability at baseline. She has a SPC, but does not typically use it.   Clinical Impression  Pt showed good effort t/o PT exam and though she had significant fatigue during session she was safe with most activities.  She did have low O2 sats with activity on 2 liters, but stayed >/= 92% on 4 liters with occasional breif rest breaks (and a single ~2 minute rest break).  Pt did not require AD or HHA for safety during prolonged ambulation but did occasionally need to lightly use rail for confidence.  She was able to negotiate up/down 3 steps with rails and no direct assist.  Pt safe to go home regarding PT concerns, O2 remains an issue.      Follow Up Recommendations Home health PT    Equipment Recommendations  None recommended by PT    Recommendations for Other Services       Precautions / Restrictions Precautions Precautions: Fall Restrictions Weight Bearing Restrictions: No      Mobility  Bed Mobility Overal bed mobility: Modified Independent             General bed mobility comments: Pt able to get up to EOB with heavy UE use on rails, but no direct assist  Transfers Overall transfer level: Modified independent Equipment used: None Transfers: Sit to/from Stand Sit to Stand: Independent         General transfer comment: Pt was able to rise to standing w/o AD and maintain balance, showed only very minimal unsteadiness  Ambulation/Gait Ambulation/Gait assistance: Supervision Ambulation Distance (Feet): 175 Feet Assistive  device: None       General Gait Details: Pt with slow, cautious ambulation but no LOBs and only infrequent and brief bouts of using hallway rail to maintain balance. She did need one standing rest break (~2 minutes) secondary to fatigue but ultimately did well regarding PT concerns.  Pt initially on 2 liters, did fatigue O2 to 90%, stayed 92-95 on 4 liters during activity.    Stairs Stairs: Yes Stairs assistance: Min guard Stair Management: Two rails Number of Stairs: 3 General stair comments: Pt was able to negotiate up/down steps, had significant fatigue with the effort  Wheelchair Mobility    Modified Rankin (Stroke Patients Only)       Balance Overall balance assessment: Modified Independent Sitting-balance support: No upper extremity supported;Feet supported Sitting balance-Leahy Scale: Normal     Standing balance support: During functional activity Standing balance-Leahy Scale: Fair Standing balance comment: Pt with some guarded posture during dynamic standing, statically she was safe and confident.  No LOBs t/o the session.                             Pertinent Vitals/Pain Pain Assessment: No/denies pain    Home Living Family/patient expects to be discharged to:: Private residence Living Arrangements: Other relatives Available Help at Discharge: Family;Available PRN/intermittently (has multiple family members live close)   Home Access: Stairs to enter Entrance Stairs-Rails: Can reach both Entrance Stairs-Number of Steps: 3 Home Layout: Two level;Full  bath on main level;Able to live on main level with bedroom/bathroom Home Equipment: Kasandra Knudsen - single point      Prior Function Level of Independence:  (modified independent in the home)   Gait / Transfers Assistance Needed: Mostly household distances with furniture for stability since admission in December 2017; doesn't use SPC but has one           Hand Dominance        Extremity/Trunk  Assessment   Upper Extremity Assessment Upper Extremity Assessment: Generalized weakness;Overall [facility] for tasks assessed    Lower Extremity Assessment Lower Extremity Assessment: Generalized weakness;Overall WFL for tasks assessed       Communication   Communication: No difficulties  Cognition Arousal/Alertness: Awake/alert Behavior During Therapy: WFL for tasks assessed/performed Overall Cognitive Status: Within Functional Limits for tasks assessed                                        General Comments      Exercises     Assessment/Plan    PT Assessment Patient needs continued PT services  PT Problem List Decreased strength;Decreased activity tolerance;Decreased mobility;Cardiopulmonary status limiting activity;Decreased balance;Obesity       PT Treatment Interventions Stair training;Gait training;Functional mobility training;Therapeutic activities;Therapeutic exercise;Balance training;Patient/family education    PT Goals (Current goals can be found in the Care Plan section)  Acute Rehab PT Goals Patient Stated Goal: go home PT Goal Formulation: With patient Time For Goal Achievement: 09/12/16 Potential to Achieve Goals: Good    Frequency Min 2X/week   Barriers to discharge        Co-evaluation               AM-PAC PT "6 Clicks" Daily Activity  Outcome Measure Difficulty turning over in bed (including adjusting bedclothes, sheets and blankets)?: None Difficulty moving from lying on back to sitting on the side of the bed? : None Difficulty sitting down on and standing up from a chair with arms (e.g., wheelchair, bedside commode, etc,.)?: None Help needed moving to and from a bed to chair (including a wheelchair)?: None Help needed walking in hospital room?: A Little Help needed climbing 3-5 steps with a railing? : A Little 6 Click Score: 22    End of Session Equipment Utilized During Treatment: Gait belt;Oxygen (2 liters at  rest/initially w/ambulation, did increase to 4L) Activity Tolerance: Patient limited by fatigue;Patient tolerated treatment well Patient left: with call bell/phone within reach;with chair alarm set;with family/visitor present   PT Visit Diagnosis: Unsteadiness on feet (R26.81);Muscle weakness (generalized) (M62.81);Difficulty in walking, not elsewhere classified (R26.2)    Time: 0786-7544 PT Time Calculation (min) (ACUTE ONLY): 24 min   Charges:   PT Evaluation $PT Eval Low Complexity: 1 Procedure     PT G Codes:        Kreg Shropshire, DPT 08/29/2016, 11:52 AM

## 2016-08-29 NOTE — Care Management (Signed)
Anticipate discharge home within next 24 hours.  Updated attending regarding need for resumption orders for home health.  Updated Nanine Means

## 2016-08-29 NOTE — Progress Notes (Signed)
[facility] [place] NAME: Hannah Faulkner    MR#:  [id number]  DATE OF BIRTH:  18-Aug-1940  SUBJECTIVE:  CHIEF COMPLAINT:   Chief Complaint  Patient presents with  . Shortness of Breath  . Leg Swelling    recent d/c with CHF, gained 16 Lb weight in last 3 days came with SOB, feels slightly better today, not enough diuresis yet. Drinks a lot of liquids at home. Had complain of urinary frequency and some obstruction yesterday so Foley catheter is placed. Had darker urine- noted to have UTI. Also had c/o bladder fullness and discomfort- removed foley.  REVIEW OF SYSTEMS:  CONSTITUTIONAL: No fever, fatigue or weakness.  EYES: No blurred or double vision.  EARS, NOSE, AND THROAT: No tinnitus or ear pain.  RESPIRATORY: No cough, positive for shortness of breath, no wheezing or hemoptysis.  CARDIOVASCULAR: No chest pain, orthopnea, edema.  GASTROINTESTINAL: No nausea, vomiting, diarrhea or abdominal pain.  GENITOURINARY: No dysuria, hematuria.  ENDOCRINE: No polyuria, nocturia,  HEMATOLOGY: No anemia, easy bruising or bleeding SKIN: No rash or lesion. MUSCULOSKELETAL: No joint pain or arthritis.   NEUROLOGIC: No tingling, numbness, weakness.  PSYCHIATRY: No anxiety or depression.   ROS  DRUG ALLERGIES:   Allergies  Allergen Reactions  . Ampicillin   . Prinzide  [Lisinopril-Hydrochlorothiazide]   . Penicillins Rash    .Has patient had a PCN reaction causing immediate rash, facial/tongue/throat swelling, SOB or lightheadedness with hypotension: Unknown Has patient had a PCN reaction causing severe rash involving mucus membranes or skin necrosis: No Has patient had a PCN reaction that required hospitalization: No Has patient had a PCN reaction occurring within the last 10 years: No If all of the above answers are "NO", then may proceed with Cephalosporin use.     VITALS:  Blood pressure 111/72, pulse 96, temperature 97.7 F (36.5 C),  temperature source Oral, resp. rate 18, height 5\' 2"  (1.575 m), weight 93.8 kg (206 lb 11.2 oz), SpO2 93 %.  PHYSICAL EXAMINATION:   GENERAL:  76 y.o.-year-old patient lying in the bed with no acute distress.  EYES: Pupils equal, round, reactive to light . No scleral icterus. Extraocular muscles intact.  HEENT: Head atraumatic, normocephalic. Oropharynx and nasopharynx clear.  NECK:  Supple, no jugular venous distention. No thyroid enlargement, no tenderness.  LUNGS:Basilar crepitations , some wheezing CARDIOVASCULAR: S1, S2 normal. No murmurs, rubs, or gallops.  ABDOMEN: Soft, nontender, nondistended. Bowel sounds present. No organomegaly or mass.  EXTREMITIES: she have pedal edema,no cyanosis, or clubbing.  NEUROLOGIC: Cranial nerves II through XII are intact. Muscle strength 4/5 in all extremities. Sensation intact. Gait not checked.  PSYCHIATRIC: The patient is alert and oriented x 3.  SKIN: No obvious rash, lesion, or ulcer.   Physical Exam LABORATORY PANEL:   CBC  Recent Labs Lab 08/28/16 0420  WBC 10.6  HGB 11.6*  HCT 35.4  PLT 227   ------------------------------------------------------------------------------------------------------------------  Chemistries   Recent Labs Lab 08/26/16 1213  08/28/16 0420 08/29/16 0528  NA 139  < > 141 139  K 4.7  < > 3.8 4.0  CL 101  < > 101 99*  CO2 31  < > 33* 34*  GLUCOSE 117*  < > 128* 122*  BUN 22*  < > 24* 23*  CREATININE 0.76  < > 0.69 0.66  CALCIUM 8.8*  < > 8.3* 8.4*  MG 2.5*  --   --   --   AST  --   --  25  --   ALT  --   --  15  --   ALKPHOS  --   --  62  --   BILITOT  --   --  0.8  --   < > = values in this interval not displayed. ------------------------------------------------------------------------------------------------------------------  Cardiac Enzymes  Recent Labs Lab 08/26/16 1213  TROPONINI <0.03    ------------------------------------------------------------------------------------------------------------------  RADIOLOGY:  Dg Chest 2 View  Result Date: 08/28/2016 CLINICAL DATA:  76 year old female with lower extremity edema and shortness of breath. EXAM: CHEST  2 VIEW COMPARISON:  08/26/2016 and earlier. FINDINGS: Seated AP and lateral views of the chest. No significant improvement in bibasilar ventilation since 08/26/2016. Streaky and confluent bibasilar opacity. Small pleural effusion suspected on the right. Small volume fluid tracking into the right minor fissure. No acute edema. No pneumothorax. Stable visible mediastinal contours. Visualized tracheal air column is within normal limits. No acute osseous abnormality identified. Negative visible bowel gas pattern. IMPRESSION: No significant improvement in ventilation since 08/26/2016. Basilar predominant bilateral pulmonary opacity could reflect atelectasis, but superimposed lung base infection is difficult to exclude. There is no overt pulmonary edema. Small right pleural effusion partially tracking into the minor fissure. Electronically Signed   By: Genevie Ann M.D.   On: 08/28/2016 08:45    ASSESSMENT AND PLAN:   Active Problems:   Acute diastolic CHF (congestive heart failure) (HCC)   #1 acute on chronic  diastolic heart failure, recent ejection fraction more than 60% by echocardiogram 2 weeks ago.   IV Lasix 40 mg every 12, check daily weights, continue low-salt diet. Appreciated cardiology consult   Councelled about fluid restriction.   Still appears to have excessive water- monitor. #2 chronic A. fib: Patient is in A. fib with heart rate in 110s but it is slightly high because of CHF. Continue,  metoprolol, Cardizem. Continue coumadin,   Pharmacy to adjust the dose. #3. Hypertension: Controlled #4 .anxiety #5 UTI    Rocephin, get Ur cx.   All the records are reviewed and case discussed with Care Management/Social  Workerr. Management plans discussed with the patient, family and they are in agreement.  CODE STATUS: Full.  TOTAL TIME TAKING CARE OF THIS PATIENT: 35 minutes.    POSSIBLE D/C IN 1-2 DAYS, DEPENDING ON CLINICAL CONDITION.   Vaughan Basta M.D on 08/29/2016   Between 7am to 6pm - Pager - [phone]  After 6pm go to [email or web address] - password [email or web address] [facility]  Office  [phone]  CC: Primary care physician; Jerrol Banana., MD  Note: This dictation was prepared with Dragon dictation along with smaller phrase technology. Any transcriptional errors that result from this process are unintentional.

## 2016-08-29 NOTE — Plan of Care (Signed)
Problem: Activity: Goal: Risk for activity intolerance will decrease Outcome: Progressing Up to chair and BSC with 1 assist, tolerates well.  Problem: Cardiac: Goal: Ability to achieve and maintain adequate cardiopulmonary perfusion will improve Outcome: Progressing Pt continues to receive IV lasix, foley in place.

## 2016-08-30 LAB — GLUCOSE, CAPILLARY: Glucose-Capillary: 107 mg/dL — ABNORMAL HIGH (ref 65–99)

## 2016-08-30 LAB — BASIC METABOLIC PANEL
Anion gap: 7 (ref 5–15)
BUN: 24 mg/dL — AB (ref 6–20)
CALCIUM: 8.7 mg/dL — AB (ref 8.9–10.3)
CO2: 33 mmol/L — ABNORMAL HIGH (ref 22–32)
Chloride: 101 mmol/L (ref 101–111)
Creatinine, Ser: 0.57 mg/dL (ref 0.44–1.00)
GFR calc Af Amer: >60 mL/min (ref >60)
Glucose, Bld: 146 mg/dL — ABNORMAL HIGH (ref 65–99)
Potassium: 4.4 mmol/L (ref 3.5–5.1)
Sodium: 141 mmol/L (ref 135–145)

## 2016-08-30 LAB — URINE CULTURE: Culture: NO GROWTH

## 2016-08-30 LAB — PROTIME-INR
INR: 2.81
PROTHROMBIN TIME: 30.2 s — AB (ref 11.4–15.2)

## 2016-08-30 MED ORDER — WARFARIN SODIUM 1 MG PO TABS
1.0000 mg | ORAL_TABLET | Freq: Once | ORAL | Status: AC
  Administered 2016-08-30: 1 mg via ORAL
  Filled 2016-08-30: qty 1

## 2016-08-30 MED ORDER — FUROSEMIDE 10 MG/ML IJ SOLN
40.0000 mg | Freq: Two times a day (BID) | INTRAMUSCULAR | Status: DC
  Administered 2016-08-30 – 2016-08-31 (×3): 40 mg via INTRAVENOUS
  Filled 2016-08-30 (×3): qty 4

## 2016-08-30 NOTE — Progress Notes (Signed)
ANTICOAGULATION CONSULT NOTE - Initial Consult  Pharmacy Consult for warfarin Indication: atrial fibrillation  Allergies  Allergen Reactions  . Ampicillin   . Prinzide  [Lisinopril-Hydrochlorothiazide]   . Penicillins Rash    .Has patient had a PCN reaction causing immediate rash, facial/tongue/throat swelling, SOB or lightheadedness with hypotension: Unknown Has patient had a PCN reaction causing severe rash involving mucus membranes or skin necrosis: No Has patient had a PCN reaction that required hospitalization: No Has patient had a PCN reaction occurring within the last 10 years: No If all of the above answers are "NO", then may proceed with Cephalosporin use.     Patient Measurements: Height: 5\' 2"  (157.5 cm) Weight: 208 lb 9.6 oz (94.6 kg) IBW/kg (Calculated) : 50.1 Heparin Dosing Weight:   Vital Signs: Temp: 97.9 F (36.6 C) (06/09 0404) Temp Source: Oral (06/09 0404) BP: 124/73 (06/09 0404) Pulse Rate: 95 (06/09 0404)  Labs:  Recent Labs  08/28/16 0420 08/29/16 0528 08/30/16 0532  HGB 11.6*  --   --   HCT 35.4  --   --   PLT 227  --   --   LABPROT 39.5* 36.2* 30.2*  INR 3.94 3.53 2.81  CREATININE 0.69 0.66 0.57    Estimated Creatinine Clearance: 64.1 mL/min (by C-G formula based on SCr of 0.57 mg/dL).   Medical History: Past Medical History:  Diagnosis Date  . A-fib ([place])   . CHF (congestive heart failure) ([place])   . Hypertension     Medications:  Infusions:  . cefTRIAXone (ROCEPHIN)  IV Stopped (08/29/16 1600)    Assessment: 76 yof cc SOB/leg swelling on VKA for AF, INR on admission 2.95. Sent here from cardiologist's office for diuresis d/t acute on chronic diastolic heart failure. Pharmacy consulted to manage VKA while admitted. Home regimen: warfarin 2 mg po every other day alternate with warfarin 2.5 mg po every other day. Per med rec patient took 2.5mg  on 6/4  6/5 INR 2.95  Warfarin 2.5 6/6 INR 3.35  Warfarin 1mg  6/7 INR 3.94   Held 6/8 INR 3.53  Held 6/9 INR 2.81  Goal of Therapy:  INR 2-3 Monitor platelets by anticoagulation protocol: Yes   Plan:  INR therapeutic after warfarin held x 2 days. Will give low dose of 1mg  today then likely rusume  daily dose of 2mg  as pt was also supratherapeutic last admission on same home dose. Recheck INR/CBC with AM labs.   Mikaella Escalona C, Pharm.D., BCPS Clinical Pharmacist 08/30/2016,9:40 AM

## 2016-08-30 NOTE — Progress Notes (Signed)
[facility] Cardiology  SUBJECTIVE: I feel better   Vitals:   08/29/16 1953 08/29/16 2214 08/29/16 2218 08/30/16 0404  BP: (!) 106/55 125/83 125/83 124/73  Pulse: (!) 111  71 95  Resp: 16   18  Temp: 98.3 F (36.8 C)   97.9 F (36.6 C)  TempSrc: Oral   Oral  SpO2: 90%   94%  Weight:    94.6 kg (208 lb 9.6 oz)  Height:         Intake/Output Summary (Last 24 hours) at 08/30/16 1022 Last data filed at 08/30/16 0957  Gross per 24 hour  Intake              530 ml  Output             2500 ml  Net            -1970 ml      PHYSICAL EXAM  General: Well developed, well nourished, in no acute distress HEENT:  Normocephalic and atramatic Neck:  No JVD.  Lungs: Clear bilaterally to auscultation and percussion. Heart: HRRR . Normal S1 and S2 without gallops or murmurs.  Abdomen: Bowel sounds are positive, abdomen soft and non-tender  Msk:  Back normal, normal gait. Normal strength and tone for age. Extremities: No clubbing, cyanosis or edema.   Neuro: Alert and oriented X 3. Psych:  Good affect, responds appropriately   LABS: Basic Metabolic Panel:  Recent Labs  08/29/16 0528 08/30/16 0532  NA 139 141  K 4.0 4.4  CL 99* 101  CO2 34* 33*  GLUCOSE 122* 146*  BUN 23* 24*  CREATININE 0.66 0.57  CALCIUM 8.4* 8.7*   Liver Function Tests:  Recent Labs  08/28/16 0420  AST 25  ALT 15  ALKPHOS 62  BILITOT 0.8  PROT 6.4*  ALBUMIN 3.4*   No results for input(s): LIPASE, AMYLASE in the last 72 hours. CBC:  Recent Labs  08/28/16 0420  WBC 10.6  HGB 11.6*  HCT 35.4  MCV 87.7  PLT 227   Cardiac Enzymes: No results for input(s): CKTOTAL, CKMB, CKMBINDEX, TROPONINI in the last 72 hours. BNP: Invalid input(s): POCBNP D-Dimer: No results for input(s): DDIMER in the last 72 hours. Hemoglobin A1C: No results for input(s): HGBA1C in the last 72 hours. Fasting Lipid Panel: No results for input(s): CHOL, HDL, LDLCALC, TRIG, CHOLHDL, LDLDIRECT in the last 72  hours. Thyroid Function Tests: No results for input(s): TSH, T4TOTAL, T3FREE, THYROIDAB in the last 72 hours.  Invalid input(s): FREET3 Anemia Panel: No results for input(s): VITAMINB12, FOLATE, FERRITIN, TIBC, IRON, RETICCTPCT in the last 72 hours.  No results found.   Echo LV EF 60-65%  TELEMETRY: Atrial fibrillation:  ASSESSMENT AND PLAN:  Active Problems:   Acute diastolic CHF (congestive heart failure) ([place])    1. Acute on chronic diastolic congestive heart failure, with known normal coronary anatomy, normal left ventricular function, with recent continued acquired pneumonia 2. Paroxysmal atrial fibrillation, currently in atrial fibrillation, with ventricular rate 90-110 bpm  Recommendations  1. Continue current therapy 2. Continue diuresis 3. Carefully monitor renal status 4. Continue warfarin for stroke prevention, target INR 2.0 3.0 5. CHF clinic as outpatient   Isaias Cowman, MD, PhD, [facility] 08/30/2016 10:22 AM

## 2016-08-30 NOTE — Progress Notes (Signed)
[place] [facility] [place] NAME: Hannah Faulkner    MR#:  [id number]  DATE OF BIRTH:  June 14, 1940  SUBJECTIVE:  CHIEF COMPLAINT:   Chief Complaint  Patient presents with  . Shortness of Breath  . Leg Swelling    recent d/c with CHF, gained 16 Lb weight in last 3 days came with SOB, feels slightly better today, After about 3.0 L of diuresis.  Had complained of urinary frequency and some obstruction day before yesterday so Foley catheter was placed. Had darker urine- noted to have UTI. Also had c/o bladder fullness and discomfort- removed foley.  REVIEW OF SYSTEMS:  CONSTITUTIONAL: No fever, fatigue or weakness.  EYES: No blurred or double vision.  EARS, NOSE, AND THROAT: No tinnitus or ear pain.  RESPIRATORY: No cough, positive for shortness of breath, no wheezing or hemoptysis.  CARDIOVASCULAR: No chest pain, orthopnea, edema.  GASTROINTESTINAL: No nausea, vomiting, diarrhea or abdominal pain.  GENITOURINARY: No dysuria, hematuria.  ENDOCRINE: No polyuria, nocturia,  HEMATOLOGY: No anemia, easy bruising or bleeding SKIN: No rash or lesion. MUSCULOSKELETAL: No joint pain or arthritis.   NEUROLOGIC: No tingling, numbness, weakness.  PSYCHIATRY: No anxiety or depression.   ROS  DRUG ALLERGIES:   Allergies  Allergen Reactions  . Ampicillin   . Prinzide  [Lisinopril-Hydrochlorothiazide]   . Penicillins Rash    .Has patient had a PCN reaction causing immediate rash, facial/tongue/throat swelling, SOB or lightheadedness with hypotension: Unknown Has patient had a PCN reaction causing severe rash involving mucus membranes or skin necrosis: No Has patient had a PCN reaction that required hospitalization: No Has patient had a PCN reaction occurring within the last 10 years: No If all of the above answers are "NO", then may proceed with Cephalosporin use.     VITALS:  Blood pressure 124/73, pulse 95, temperature 97.9 F (36.6 C), temperature source  Oral, resp. rate 18, height 5\' 2"  (1.575 m), weight 94.6 kg (208 lb 9.6 oz), SpO2 94 %.  PHYSICAL EXAMINATION:   GENERAL:  76 y.o.-year-old patient lying in the bed with no acute distress.  EYES: Pupils equal, round, reactive to light . No scleral icterus. Extraocular muscles intact.  HEENT: Head atraumatic, normocephalic. Oropharynx and nasopharynx clear.  NECK:  Supple, no jugular venous distention. No thyroid enlargement, no tenderness.  LUNGS:Basilar crepitations Bilaterally, good air entrance, no rales, no rhonchi. Mild respiratory distress on exertion and the speech, tachypnea CARDIOVASCULAR: S1, S2 , irregularly irregular. No murmurs, rubs, or gallops.  ABDOMEN: Soft, nontender, nondistended. Bowel sounds present. No organomegaly or mass.  EXTREMITIES: she have pedal edema,no cyanosis, or clubbing.  NEUROLOGIC: Cranial nerves II through XII are intact. Muscle strength 4/5 in all extremities. Sensation intact. Gait not checked.  PSYCHIATRIC: The patient is alert and oriented x 3.  SKIN: No obvious rash, lesion, or ulcer.   Physical Exam LABORATORY PANEL:   CBC  Recent Labs Lab 08/28/16 0420  WBC 10.6  HGB 11.6*  HCT 35.4  PLT 227   ------------------------------------------------------------------------------------------------------------------  Chemistries   Recent Labs Lab 08/26/16 1213  08/28/16 0420  08/30/16 0532  NA 139  < > 141  < > 141  K 4.7  < > 3.8  < > 4.4  CL 101  < > 101  < > 101  CO2 31  < > 33*  < > 33*  GLUCOSE 117*  < > 128*  < > 146*  BUN 22*  < > 24*  < >  24*  CREATININE 0.76  < > 0.69  < > 0.57  CALCIUM 8.8*  < > 8.3*  < > 8.7*  MG 2.5*  --   --   --   --   AST  --   --  25  --   --   ALT  --   --  15  --   --   ALKPHOS  --   --  62  --   --   BILITOT  --   --  0.8  --   --   < > = values in this interval not  displayed. ------------------------------------------------------------------------------------------------------------------  Cardiac Enzymes  Recent Labs Lab 08/26/16 1213  TROPONINI <0.03   ------------------------------------------------------------------------------------------------------------------  RADIOLOGY:  No results found.  ASSESSMENT AND PLAN:   Active Problems:   Acute diastolic CHF (congestive heart failure) (HCC)   #1 acute on chronic  diastolic heart failure, recent ejection fraction more than 60% by echocardiogram 2 weeks ago. Continue IV Lasix 40 mg every 12, check daily weights, continue low-salt diet, diuresed about 3.2 L since admission Appreciate cardiology input, discussed with Dr. Saralyn Pilar  Still appears to have fluid overload- monitor with diuretics.  #2 chronic A. fib: Patient is in A. fib with heart rate in 70-110s Continue,  metoprolol, Cardizem. Continue coumadin,   Pharmacy to adjust the dose.  #3. Hypertension: Controlled  #4 .anxiety, continue Xanax  #5 . Pyuria, sterile, discontinue Rocephin, negative Ur cx.   All the records are reviewed and case discussed with Care Management/Social Workerr. Management plans discussed with the patient, family and they are in agreement.  CODE STATUS: Full.  TOTAL TIME TAKING CARE OF THIS PATIENT: 35 minutes.    POSSIBLE D/C IN 1-2 DAYS, DEPENDING ON CLINICAL CONDITION.   Theodoro Grist M.D on 08/30/2016   Between 7am to 6pm - Pager - [phone]  After 6pm go to [email or web address] - password [email or web address] [facility]  Office  [phone]  CC: Primary care physician; Jerrol Banana., MD  Note: This dictation was prepared with Dragon dictation along with smaller phrase technology. Any transcriptional errors that result from this process are unintentional.

## 2016-08-30 NOTE — Progress Notes (Signed)
Per pt request, lasix administration time changed to 0600 and 1800

## 2016-08-31 ENCOUNTER — Other Ambulatory Visit: Payer: Self-pay | Admitting: Family Medicine

## 2016-08-31 DIAGNOSIS — R8281 Pyuria: Secondary | ICD-10-CM

## 2016-08-31 DIAGNOSIS — R739 Hyperglycemia, unspecified: Secondary | ICD-10-CM

## 2016-08-31 LAB — CBC
HEMATOCRIT: 35.2 % (ref 35.0–47.0)
Hemoglobin: 11.6 g/dL — ABNORMAL LOW (ref 12.0–16.0)
MCH: 28.6 pg (ref 26.0–34.0)
MCHC: 32.9 g/dL (ref 32.0–36.0)
MCV: 86.9 fL (ref 80.0–100.0)
PLATELETS: 212 10*3/uL (ref 150–440)
RBC: 4.05 MIL/uL (ref 3.80–5.20)
RDW: 15.4 % — AB (ref 11.5–14.5)
WBC: 8.3 10*3/uL (ref 3.6–11.0)

## 2016-08-31 LAB — GLUCOSE, CAPILLARY: Glucose-Capillary: 106 mg/dL — ABNORMAL HIGH (ref 65–99)

## 2016-08-31 LAB — PROTIME-INR
INR: 2.37
PROTHROMBIN TIME: 26.3 s — AB (ref 11.4–15.2)

## 2016-08-31 MED ORDER — METOPROLOL TARTRATE 100 MG PO TABS
100.0000 mg | ORAL_TABLET | Freq: Two times a day (BID) | ORAL | Status: DC
  Administered 2016-08-31: 100 mg via ORAL
  Filled 2016-08-31: qty 1

## 2016-08-31 MED ORDER — METOPROLOL TARTRATE 100 MG PO TABS: 100.0000 mg | ORAL_TABLET | Freq: Two times a day (BID) | ORAL | 3 refills | Status: AC

## 2016-08-31 MED ORDER — POTASSIUM CHLORIDE CRYS ER 20 MEQ PO TBCR: 20.0000 meq | EXTENDED_RELEASE_TABLET | Freq: Two times a day (BID) | ORAL | 3 refills | Status: DC

## 2016-08-31 MED ORDER — FUROSEMIDE 40 MG PO TABS: 40.0000 mg | ORAL_TABLET | Freq: Two times a day (BID) | ORAL | 3 refills | Status: DC

## 2016-08-31 MED ORDER — WARFARIN SODIUM 1 MG PO TABS
1.5000 mg | ORAL_TABLET | Freq: Once | ORAL | Status: DC
  Filled 2016-08-31: qty 1

## 2016-08-31 NOTE — Care Management Note (Addendum)
Case Management Note  Patient Details  Name: Naevia Unterreiner Tawil MRN: [id number] Date of Birth: [birth year]  Subjective/Objective:       Resumption of care orders for PT, RN, Aide were called to Judson Roch at [facility]. Mrs Herder has chronic 02 at home.              Action/Plan:   Expected Discharge Date:  08/31/16               Expected Discharge Plan:     In-House Referral:     Discharge planning Services  CM Consult  Post Acute Care Choice:  Home Health Choice offered to:     DME Arranged:    DME Agency:     HH Arranged:  PT, RN, Nurse's Aide, OT HH Agency:   ([facility])  Status of Service:  Completed, signed off  If discussed at [place] of Stay Meetings, dates discussed:    Additional Comments:  Myron Lona A, RN 08/31/2016, 11:46 AM

## 2016-08-31 NOTE — Progress Notes (Signed)
[facility] Cardiology  SUBJECTIVE: I feel better   Vitals:   08/30/16 0404 08/30/16 1927 08/31/16 0451 08/31/16 0500  BP: 124/73 (!) 108/51 101/60   Pulse: 95 (!) 104 (!) 134   Resp: 18 18 16    Temp: 97.9 F (36.6 C) 98.3 F (36.8 C) 97.8 F (36.6 C)   TempSrc: Oral Oral Oral   SpO2: 94% 91% 93%   Weight: 94.6 kg (208 lb 9.6 oz)   94 kg (207 lb 4.8 oz)  Height:         Intake/Output Summary (Last 24 hours) at 08/31/16 8768 Last data filed at 08/31/16 0500  Gross per 24 hour  Intake              600 ml  Output              800 ml  Net             -200 ml      PHYSICAL EXAM  General: Well developed, well nourished, in no acute distress HEENT:  Normocephalic and atramatic Neck:  No JVD.  Lungs: Clear bilaterally to auscultation and percussion. Heart: HRRR . Normal S1 and S2 without gallops or murmurs.  Abdomen: Bowel sounds are positive, abdomen soft and non-tender  Msk:  Back normal, normal gait. Normal strength and tone for age. Extremities: No clubbing, cyanosis or edema.   Neuro: Alert and oriented X 3. Psych:  Good affect, responds appropriately   LABS: Basic Metabolic Panel:  Recent Labs  08/29/16 0528 08/30/16 0532  NA 139 141  K 4.0 4.4  CL 99* 101  CO2 34* 33*  GLUCOSE 122* 146*  BUN 23* 24*  CREATININE 0.66 0.57  CALCIUM 8.4* 8.7*   Liver Function Tests: No results for input(s): AST, ALT, ALKPHOS, BILITOT, PROT, ALBUMIN in the last 72 hours. No results for input(s): LIPASE, AMYLASE in the last 72 hours. CBC:  Recent Labs  08/31/16 0531  WBC 8.3  HGB 11.6*  HCT 35.2  MCV 86.9  PLT 212   Cardiac Enzymes: No results for input(s): CKTOTAL, CKMB, CKMBINDEX, TROPONINI in the last 72 hours. BNP: Invalid input(s): POCBNP D-Dimer: No results for input(s): DDIMER in the last 72 hours. Hemoglobin A1C: No results for input(s): HGBA1C in the last 72 hours. Fasting Lipid Panel: No results for input(s): CHOL, HDL, LDLCALC, TRIG, CHOLHDL, LDLDIRECT  in the last 72 hours. Thyroid Function Tests: No results for input(s): TSH, T4TOTAL, T3FREE, THYROIDAB in the last 72 hours.  Invalid input(s): FREET3 Anemia Panel: No results for input(s): VITAMINB12, FOLATE, FERRITIN, TIBC, IRON, RETICCTPCT in the last 72 hours.  No results found.   Echo LV EF 60-65%  TELEMETRY: Atrial fibrillation:  ASSESSMENT AND PLAN:  Active Problems:   Acute diastolic CHF (congestive heart failure) ([place])    1. Acute on chronic diastolic congestive heart failure, with known normal coronary anatomy, known normal left ventricular function, with recent history of community-acquired pneumonia, improving after diuresis 2. Paroxysmal atrial fibrillation, currently in atrial fibrillation, rate elevated today  Recommendations  1. Continue current therapy 2. Continue diuresis 3. Carefully monitor renal status 4. Continue warfarin for stroke prevention, target INR 2.0-3.0 5. Uptitrate metoprolol tartrate 100 mg twice a day 6. Increase activity today, the patient does well, consider discharge later today 7. CHF clinic as outpatient  Signed off for now, please call if any questions   Isaias Cowman, MD, PhD, [facility] 08/31/2016 9:27 AM

## 2016-08-31 NOTE — Discharge Planning (Signed)
Patient IV  And tele removed.  Discharge instructions given, explained and educated.  RN assessment and VS revealed stability for DC to home with [facility]. Educated on diet and importance of daily weights.  Informed of scripts e-scribed to [facility], [place].  Told of FU appts and appts made. Once ready, will be wheeled to front  And family transporting home via car.

## 2016-08-31 NOTE — Discharge Summary (Signed)
[name] NAME: Hannah Faulkner    MR#:  [id number]  DATE OF BIRTH:  12/17/1940  DATE OF ADMISSION:  08/26/2016 ADMITTING PHYSICIAN: Epifanio Lesches, MD  DATE OF DISCHARGE: 08/31/2016  1:58 PM  PRIMARY CARE PHYSICIAN: Jerrol Banana., MD     ADMISSION DIAGNOSIS:  Acute on chronic congestive heart failure, unspecified heart failure type ([place]) [I50.9]  DISCHARGE DIAGNOSIS:  Active Problems:   Acute diastolic CHF (congestive heart failure) (HCC)   Sterile pyuria   Hyperglycemia   SECONDARY DIAGNOSIS:   Past Medical History:  Diagnosis Date  . A-fib ([place])   . CHF (congestive heart failure) ([place])   . Hypertension     .pro HOSPITAL COURSE:   Patient is 76 year old female with history of atrial fibrillation, CHF, hypertension, who presents to the hospital with complaints of 16 pound weight gain, pedal edema, shortness of breath, orthopnea. On arrival to the hospital, she was noted to be tachycardic with heart rate around 120. Clinically, she had congestive heart failure with Bibasilar crepitations. She was admitted to the hospital with acute on chronic diastolic CHF, initiated on intravenous Lasix, diuresed approximately 3.3 L since admission. She was followed by cardiologist while in the hospital, who recommended to continue diuretics at higher dose than previously ordered, advanced metoprolol to control patient's heart rate better. Patient was felt to be stable to be discharged home today on her usual 2 L of oxygen through nasal cannula Discussion by problem: #1 acute on chronic diastolic heart failure, recent ejection fraction more than 60% by echocardiogram 2 weeks ago. Continue oral Lasix changed to twice a day, follow daily weights, continue low-salt diet, diuresed about 3.3 L since admission.  Appreciate cardiology input, discussed with Dr. Saralyn Pilar. The patient is back on her usual 2 liters of oxygen  through nasal cannula, being discharged home today with cardiologist, follow-up as outpatient , CHF clinic was recommended upon discharge     #2 chronic A. fib: Patient's  heart rate fluctuated between-110- 160. The patient is to continue higher dose of   metoprolol and  Cardizem. Continue coumadin. She is to follow up with cardiologist as outpatient and advance her heart rate limiting medications even higher if needed     #3. Hypertension: Controlled  #4 .anxiety, continue Xanax  #5 . Pyuria, sterile,  negative Ur cx, Antibiotic was discontinued. DISCHARGE CONDITIONS:   Stable  CONSULTS OBTAINED:  Treatment Team:  Corey Skains, MD Isaias Cowman, MD Clabe Seal, PA-C  DRUG ALLERGIES:   Allergies  Allergen Reactions  . Ampicillin   . Prinzide  [Lisinopril-Hydrochlorothiazide]   . Penicillins Rash    .Has patient had a PCN reaction causing immediate rash, facial/tongue/throat swelling, SOB or lightheadedness with hypotension: Unknown Has patient had a PCN reaction causing severe rash involving mucus membranes or skin necrosis: No Has patient had a PCN reaction that required hospitalization: No Has patient had a PCN reaction occurring within the last 10 years: No If all of the above answers are "NO", then may proceed with Cephalosporin use.     DISCHARGE MEDICATIONS:   Discharge Medication List as of 08/31/2016  1:27 PM    CONTINUE these medications which have CHANGED   Details  furosemide (LASIX) 40 MG tablet Take 1 tablet (40 mg total) by mouth 2 (two) times daily., Starting Sun 08/31/2016, Normal    metoprolol tartrate (LOPRESSOR) 100 MG tablet Take 1 tablet (100 mg total) by  mouth 2 (two) times daily., Starting Sun 08/31/2016, Normal    potassium chloride SA (K-DUR,KLOR-CON) 20 MEQ tablet Take 1 tablet (20 mEq total) by mouth 2 (two) times daily., Starting Sun 08/31/2016, Normal      CONTINUE these medications which have NOT CHANGED   Details    ALPRAZolam (XANAX) 0.5 MG tablet TAKE 1 TABLET BY MOUTH AT BEDTIME AS NEEDED FOR ANXIETY, Print    cyanocobalamin 1000 MCG tablet Take 1,000 mcg by mouth daily., Historical Med    diltiazem (CARDIZEM) 120 MG tablet Take 120 mg by mouth 2 times daily at 12 noon and 4 pm. , Historical Med    flecainide (TAMBOCOR) 50 MG tablet Take 50 mg by mouth 2 (two) times daily. , Starting Wed 11/16/2013, Historical Med    gabapentin (NEURONTIN) 100 MG capsule Take 2 capsules (200 mg total) by mouth 3 (three) times daily., Starting Wed 05/14/2016, Normal    PROAIR HFA 108 (90 Faulkner) MCG/ACT inhaler INHALE 2 INHALATIONS INTO THE LUNGS EVERY 6 (SIX) HOURS AS NEEDED FOR WHEEZING., Historical Med    Pyridoxine HCl (VITAMIN B6) 200 MG TABS Take 200 tablets by mouth 2 (two) times daily. , Starting Wed 11/16/2013, Historical Med    !! warfarin (COUMADIN) 2 MG tablet Take 2 mg by mouth every other day. Take 2mg  one day and take 2.5mg  next day, alternating., Historical Med    !! warfarin (COUMADIN) 2.5 MG tablet Take 2.5 mg by mouth every other day. Take 2mg  one day and take 2.5mg  next day, alternating., Starting Wed 11/16/2013, Historical Med     !! - Potential duplicate medications found. Please discuss with provider.    STOP taking these medications     hydrALAZINE (APRESOLINE) 25 MG tablet          DISCHARGE INSTRUCTIONS:    The patient is to follow-up with primary care physician and cardiologist as outpatient  If you experience worsening of your admission symptoms, develop shortness of breath, life threatening emergency, suicidal or homicidal thoughts you must seek medical attention immediately by calling 911 or calling your MD immediately  if symptoms less severe.  You Must read complete instructions/literature along with all the possible adverse reactions/side effects for all the Medicines you take and that have been prescribed to you. Take any new Medicines after you have completely understood and  accept all the possible adverse reactions/side effects.   Please note  You were cared for by a hospitalist during your hospital stay. If you have any questions about your discharge medications or the care you received while you were in the hospital after you are discharged, you can call the unit and asked to speak with the hospitalist on call if the hospitalist that took care of you is not available. Once you are discharged, your primary care physician will handle any further medical issues. Please note that NO REFILLS for any discharge medications will be authorized once you are discharged, as it is imperative that you return to your primary care physician (or establish a relationship with a primary care physician if you do not have one) for your aftercare needs so that they can reassess your need for medications and monitor your lab values.    Today   CHIEF COMPLAINT:   Chief Complaint  Patient presents with  . Shortness of Breath  . Leg Swelling    HISTORY OF PRESENT ILLNESS:    VITAL SIGNS:  Blood pressure 123/69, pulse (!) 143, temperature 98 F (36.7 C), temperature  source Oral, resp. rate 20, height 5\' 2"  (1.575 m), weight 94 kg (207 lb 4.8 oz), SpO2 93 %.  I/O:   Intake/Output Summary (Last 24 hours) at 08/31/16 1457 Last data filed at 08/31/16 0900  Gross per 24 hour  Intake              480 ml  Output              600 ml  Net             -120 ml    PHYSICAL EXAMINATION:  GENERAL:  76 y.o.-year-old patient lying in the bed with no acute distress.  EYES: Pupils equal, round, reactive to light and accommodation. No scleral icterus. Extraocular muscles intact.  HEENT: Head atraumatic, normocephalic. Oropharynx and nasopharynx clear.  NECK:  Supple, no jugular venous distention. No thyroid enlargement, no tenderness.  LUNGS: Normal breath sounds bilaterally, no wheezing, rales,rhonchi or crepitation. No use of accessory muscles of respiration.  CARDIOVASCULAR: S1, S2  normal. No murmurs, rubs, or gallops.  ABDOMEN: Soft, non-tender, non-distended. Bowel sounds present. No organomegaly or mass.  EXTREMITIES: No pedal edema, cyanosis, or clubbing.  NEUROLOGIC: Cranial nerves II through XII are intact. Muscle strength 5/5 in all extremities. Sensation intact. Gait not checked.  PSYCHIATRIC: The patient is alert and oriented x 3.  SKIN: No obvious rash, lesion, or ulcer.   DATA REVIEW:   CBC  Recent Labs Lab 08/31/16 0531  WBC 8.3  HGB 11.6*  HCT 35.2  PLT 212    Chemistries   Recent Labs Lab 08/26/16 1213  08/28/16 0420  08/30/16 0532  NA 139  < > 141  < > 141  K 4.7  < > 3.8  < > 4.4  CL 101  < > 101  < > 101  CO2 31  < > 33*  < > 33*  GLUCOSE 117*  < > 128*  < > 146*  BUN 22*  < > 24*  < > 24*  CREATININE 0.76  < > 0.69  < > 0.57  CALCIUM 8.8*  < > 8.3*  < > 8.7*  MG 2.5*  --   --   --   --   AST  --   --  25  --   --   ALT  --   --  15  --   --   ALKPHOS  --   --  62  --   --   BILITOT  --   --  0.8  --   --   < > = values in this interval not displayed.  Cardiac Enzymes  Recent Labs Lab 08/26/16 1213  Bear <0.03    Microbiology Results  Results for orders placed or performed during the hospital encounter of 08/26/16  MRSA PCR Screening     Status: None   Collection Time: 08/26/16 11:49 PM  Result Value Ref Range Status   MRSA by PCR NEGATIVE NEGATIVE Final    Comment:        The GeneXpert MRSA Assay (FDA approved for NASAL specimens only), is one component of a comprehensive MRSA colonization surveillance program. It is not intended to diagnose MRSA infection nor to guide or monitor treatment for MRSA infections.   Urine Culture     Status: None   Collection Time: 08/28/16 12:18 PM  Result Value Ref Range Status   Specimen Description URINE, RANDOM  Final   Special Requests NONE  Final  Culture   Final    NO GROWTH Performed at [facility], [place] [address], [place], [place] [address]     Report Status 08/30/2016 FINAL  Final    RADIOLOGY:  No results found.  EKG:   Orders placed or performed during the hospital encounter of 08/26/16  . ED EKG within 10 minutes  . ED EKG within 10 minutes      Management plans discussed with the patient, family and they are in agreement.  CODE STATUS:     Code Status Orders        Start     Ordered   08/26/16 1408  Full code  Continuous     08/26/16 1411    Code Status History    Date Active Date Inactive Code Status Order ID Comments User Context   08/04/2016 10:21 PM 08/14/2016  8:40 PM Full Code [id number]  Harvie Bridge, DO Inpatient   02/29/2016  8:53 PM 03/03/2016  2:03 PM Full Code [id number]  Henreitta Leber, MD Inpatient      TOTAL TIME TAKING CARE OF THIS PATIENT: 40  minutes.    Theodoro Grist M.D on 08/31/2016 at 2:57 PM  Between 7am to 6pm - Pager - [phone]  After 6pm go to [email or web address] - password [email or web address]  [facility]  Office  [phone]  CC: Primary care physician; Jerrol Banana., MD

## 2016-08-31 NOTE — Progress Notes (Signed)
ANTICOAGULATION CONSULT NOTE - Initial Consult  Pharmacy Consult for warfarin Indication: atrial fibrillation  Allergies  Allergen Reactions  . Ampicillin   . Prinzide  [Lisinopril-Hydrochlorothiazide]   . Penicillins Rash    .Has patient had a PCN reaction causing immediate rash, facial/tongue/throat swelling, SOB or lightheadedness with hypotension: Unknown Has patient had a PCN reaction causing severe rash involving mucus membranes or skin necrosis: No Has patient had a PCN reaction that required hospitalization: No Has patient had a PCN reaction occurring within the last 10 years: No If all of the above answers are "NO", then may proceed with Cephalosporin use.     Patient Measurements: Height: 5\' 2"  (157.5 cm) Weight: 207 lb 4.8 oz (94 kg) IBW/kg (Calculated) : 50.1 Heparin Dosing Weight:   Vital Signs: Temp: 97.8 F (36.6 C) (06/10 0451) Temp Source: Oral (06/10 0451) BP: 101/60 (06/10 0451) Pulse Rate: 134 (06/10 0451)  Labs:  Recent Labs  08/29/16 0528 08/30/16 0532 08/31/16 0531  HGB  --   --  11.6*  HCT  --   --  35.2  PLT  --   --  212  LABPROT 36.2* 30.2* 26.3*  INR 3.53 2.81 2.37  CREATININE 0.66 0.57  --     Estimated Creatinine Clearance: 63.9 mL/min (by C-G formula based on SCr of 0.57 mg/dL).   Medical History: Past Medical History:  Diagnosis Date  . A-fib ([place])   . CHF (congestive heart failure) ([place])   . Hypertension     Medications:  Infusions:    Assessment: 76 yof cc SOB/leg swelling on VKA for AF, INR on admission 2.95. Sent here from cardiologist's office for diuresis d/t acute on chronic diastolic heart failure. Pharmacy consulted to manage VKA while admitted. Home regimen: warfarin 2 mg po every other day alternate with warfarin 2.5 mg po every other day. Per med rec patient took 2.5mg  on 6/4  6/5 INR 2.95  Warfarin 2.5 6/6 INR 3.35  Warfarin 1mg  6/7 INR 3.94  Held 6/8 INR 3.53  Held 6/9 INR 2.81  Warfarin 1mg  6/10 INR  2.37  Goal of Therapy:  INR 2-3 Monitor platelets by anticoagulation protocol: Yes   Plan:  INR therapeutic and trending down. Will give warfarin 1.5 x 1 today and recheck INR with AM labs.  Of note pt was supratherapeutic last admission on same home dose. Once INR stable, would resume reduced dose of warfarin 1.5mg  every other day, alternating with 2mg .   Anavi Branscum C, Pharm.D., BCPS Clinical Pharmacist 08/31/2016,10:05 AM

## 2016-08-31 NOTE — Progress Notes (Signed)
Pt slept off and on throughout the night. No signs or c/o pain or acute distress noted.

## 2016-09-01 ENCOUNTER — Telehealth: Payer: Self-pay | Admitting: Internal Medicine

## 2016-09-01 ENCOUNTER — Telehealth: Payer: Self-pay | Admitting: Family Medicine

## 2016-09-01 ENCOUNTER — Ambulatory Visit (INDEPENDENT_AMBULATORY_CARE_PROVIDER_SITE_OTHER): Payer: Medicare Other | Admitting: Internal Medicine

## 2016-09-01 ENCOUNTER — Encounter: Payer: Self-pay | Admitting: Internal Medicine

## 2016-09-01 VITALS — BP 112/80 | HR 122 | Resp 16 | Ht 62.0 in | Wt 206.0 lb

## 2016-09-01 DIAGNOSIS — G4733 Obstructive sleep apnea (adult) (pediatric): Secondary | ICD-10-CM | POA: Diagnosis not present

## 2016-09-01 DIAGNOSIS — G4719 Other hypersomnia: Secondary | ICD-10-CM

## 2016-09-01 LAB — HEMOGLOBIN A1C
Hgb A1c MFr Bld: 6.2 % — ABNORMAL HIGH (ref 4.8–5.6)
MEAN PLASMA GLUCOSE: 131 mg/dL

## 2016-09-01 NOTE — Telephone Encounter (Signed)
Patient recently released from hospital and has an appt scheduled for today.  She wants to confirm that she needs to come today as she is not feeling well to travel.    Please advise .  Appt is today at 1030

## 2016-09-01 NOTE — Telephone Encounter (Signed)
Please advise 

## 2016-09-01 NOTE — Patient Instructions (Signed)
Sleep study needed Continue oxygen as needed

## 2016-09-01 NOTE — Telephone Encounter (Signed)
Hannah Faulkner from [facility] called requesting refill on ALPRAZolam Duanne Moron) 0.5 MG tablet sent to [facility].Call back # [phone]

## 2016-09-01 NOTE — Telephone Encounter (Signed)
Front staff Verdene Lennert will be calling patient back to make her aware she needs to keep hospital f/u.

## 2016-09-01 NOTE — Progress Notes (Signed)
Name: Hannah Faulkner MRN: [id number] DOB: 11-16-1940     CONSULTATION DATE: (Not on file)  REFERRING MD : Reva Bores  CHIEF COMPLAINT: SOB  76 year old female with known hx of CHF transferred from the floor due to shortness of breath related to worsening Pulmonary edema/CHF Exacerbation requiring BiPAP.  SIGNIFICANT EVENTS  08/26/16 >> patient admitted with acute diastolic CHF exacerbation 08/26/16>> Patient transferred from the floor to ICU related to  Worsening pulmonary edema/ afib with RVR. 6/10 Discharged home  STUDIES:  08/07/16 ECHO>>Left ventricle: The cavity size was normal. Systolic function wasnormal. The estimated ejection fraction was in the range of 60%to 65%.  HISTORY OF PRESENT ILLNESS:   Hannah Faulkner is a 76 year old female with known history of diastolic heart failure and Atrial fibrillation.  - Patient was admitted on 6/5 for CHF exacerbation and was getting diuresed.   -  CXR  Was concerning for worsening Pulmonary edema.  I have Independently reviewed images of  CXR  on 09/01/2016 Interpretation: b/l edema  Patient was d/c home She has been having chronic SOB and DOE for many months No signs of infection at this time   Patient has been having excessive daytime sleepiness Patient has been having extreme fatigue and tiredness, lack of energy Patient has been told that she has very  Loud snoring every night Patient has been told that she struggles to breathe at night and gasps for air  Previous Dx of Sleep apnea She couldn't handle the mask without humidity  She has h/o afib Multiple admissions to hosp for heart failure On oxygen since last 2 months  Patient has had progressive resp status decline over last 6 months Prognosis is guarded  PAST MEDICAL HISTORY :   has a past medical history of A-fib ([place]); CHF (congestive heart failure) ([place]); and Hypertension.  has a past surgical history that includes Polypectomy and Eye surgery. Prior to Admission  medications   Medication Sig Start Date End Date Taking? Authorizing Provider  ALPRAZolam Duanne Moron) 0.5 MG tablet TAKE 1 TABLET BY MOUTH AT BEDTIME AS NEEDED FOR ANXIETY 09/01/16   Jerrol Banana., MD  cyanocobalamin 1000 MCG tablet Take 1,000 mcg by mouth daily.    [provider]  diltiazem (CARDIZEM) 120 MG tablet Take 120 mg by mouth 2 times daily at 12 noon and 4 pm.     [provider]  flecainide (TAMBOCOR) 50 MG tablet Take 50 mg by mouth 2 (two) times daily.  11/16/13   [provider]  furosemide (LASIX) 40 MG tablet Take 1 tablet (40 mg total) by mouth 2 (two) times daily. 08/31/16   Theodoro Grist, MD  gabapentin (NEURONTIN) 100 MG capsule Take 2 capsules (200 mg total) by mouth 3 (three) times daily. Patient taking differently: Take 100 mg by mouth 3 (three) times daily.  05/14/16   Jerrol Banana., MD  metoprolol tartrate (LOPRESSOR) 100 MG tablet Take 1 tablet (100 mg total) by mouth 2 (two) times daily. 08/31/16   Theodoro Grist, MD  potassium chloride SA (K-DUR,KLOR-CON) 20 MEQ tablet Take 1 tablet (20 mEq total) by mouth 2 (two) times daily. 08/31/16   Theodoro Grist, MD  PROAIR HFA 108 (90 Base) MCG/ACT inhaler INHALE 2 INHALATIONS INTO THE LUNGS EVERY 6 (SIX) HOURS AS NEEDED FOR WHEEZING. 04/04/15   [provider]  Pyridoxine HCl (VITAMIN B6) 200 MG TABS Take 200 tablets by mouth 2 (two) times daily.  11/16/13   [provider]  warfarin (COUMADIN) 2 MG tablet Take 2 mg by mouth every other day. Take 2mg  one day and take 2.5mg  next day, alternating.    [provider]  warfarin (COUMADIN) 2.5 MG tablet Take 2.5 mg by mouth every other day. Take 2mg  one day and take 2.5mg  next day, alternating. 11/16/13   [provider]   Allergies  Allergen Reactions  . Ampicillin   . Prinzide  [Lisinopril-Hydrochlorothiazide]   . Penicillins Rash    .Has patient had a PCN reaction causing immediate rash, facial/tongue/throat  swelling, SOB or lightheadedness with hypotension: Unknown Has patient had a PCN reaction causing severe rash involving mucus membranes or skin necrosis: No Has patient had a PCN reaction that required hospitalization: No Has patient had a PCN reaction occurring within the last 10 years: No If all of the above answers are "NO", then may proceed with Cephalosporin use.     FAMILY HISTORY:  family history includes Atrial fibrillation in her sister; CVA in her mother and sister; Dementia in her sister; Diabetes in her sister; Heart attack in her mother and sister; Heart disease in her mother; Hypertension in her mother; Liver cancer in her brother; Lung cancer in her father; Thyroid disease in her sister, sister, and sister. SOCIAL HISTORY:  reports that she has never smoked. She has never used smokeless tobacco. She reports that she does not drink alcohol or use drugs.  REVIEW OF SYSTEMS:   Constitutional: Negative for fever, chills, weight loss, +malaise/fatigue  HENT: Negative for hearing loss, ear pain, nosebleeds, congestion, sore throat, neck pain, tinnitus and ear discharge.   Eyes: Negative for blurred vision, double vision, photophobia, pain, discharge and redness.  Respiratory: Negative for cough, hemoptysis, sputum production, +shortness of breath, -wheezing and -stridor.   Cardiovascular: Negative for chest pain, palpitations, orthopnea, claudication, leg swelling and PND.  Gastrointestinal: Negative for heartburn, nausea, vomiting, abdominal pain, diarrhea, constipation, blood in stool and melena.  Genitourinary: Negative for dysuria, urgency, frequency, hematuria and flank pain.  Musculoskeletal: Negative for myalgias, back pain, joint pain and falls. +edema Skin: Negative for itching and rash.  Neurological: Negative for dizziness, tingling, tremors, sensory change, speech change, focal weakness, seizures, loss of consciousness, weakness and headaches.  Endo/Heme/Allergies:  Negative for environmental allergies and polydipsia. Does not bruise/bleed easily.  ALL OTHER ROS ARE NEGATIVE   BP 112/80 (BP Location: Right Arm, Cuff Size: Large)   Pulse (!) 122   Resp 16   Ht 5\' 2"  (1.575 m)   Wt 206 lb (93.4 kg)   SpO2 (!) 89%   BMI 37.68 kg/m     Physical Examination:   GENERAL:NAD, no fevers, chills, no weakness no fatigue HEAD: Normocephalic, atraumatic.  EYES: Pupils equal, round, reactive to light. Extraocular muscles intact. No scleral icterus.  MOUTH: Moist mucosal membrane.   EAR, NOSE, THROAT: Clear without exudates. No external lesions.  NECK: Supple. No thyromegaly. No nodules. No JVD.  PULMONARY:CTA B/L no wheezes, no crackles, no rhonchi CARDIOVASCULAR: S1 and S2. Regular rate and rhythm. No murmurs, rubs, or gallops. No edema.  GASTROINTESTINAL: Soft, nontender, nondistended. No masses. Positive bowel sounds.  MUSCULOSKELETAL:+edema. Range of motion full in all extremities.  NEUROLOGIC: Cranial nerves II through XII are intact. No gross focal neurological deficits.  SKIN: No ulceration, lesions, rashes, or cyanosis. Skin warm and dry. Turgor intact.  PSYCHIATRIC: Mood, affect within normal limits. The patient is awake, alert and oriented x 3. Insight, judgment intact.  ASSESSMENT / PLAN: 76 yo female with recent hospitalization for acute diastolic heart failure with chronic afib in setting of obesity with previous DX of OSA with deconditioned states with chronic hypoxic resp failure  1.SOB-multifactoriall, heart failure, obesity, deconditioned state  2.Diastolic heart failure -follow up Cardiology as scheduled  3.[place] Dx of OSA -will need sleep study asap -patient will need aggressive therapy soon in order to help prevent further admission to the hospital  4.chornic hypoxic resp failure -needs oxygen as prescribed  5.afib-follow up cardiology  6.Obesity -recommend significant weight loss -recommend changing  diet  7.Deconditioned state -Recommend increased daily activity and exercise    Patient/Family are satisfied with Plan of action and management. All questions answered Follow up in 3 months  Hannah Faulkner Patricia Pesa, M.D.  [facility]  Medical Director [place] Director [facility] Cardio-Pulmonary Department

## 2016-09-02 ENCOUNTER — Ambulatory Visit (INDEPENDENT_AMBULATORY_CARE_PROVIDER_SITE_OTHER): Payer: Medicare Other | Admitting: Family Medicine

## 2016-09-02 ENCOUNTER — Encounter: Payer: Self-pay | Admitting: Family Medicine

## 2016-09-02 VITALS — BP 120/68 | HR 78 | Temp 97.8°F | Resp 24 | Wt 207.0 lb

## 2016-09-02 DIAGNOSIS — Z9981 Dependence on supplemental oxygen: Secondary | ICD-10-CM | POA: Diagnosis not present

## 2016-09-02 DIAGNOSIS — Z8701 Personal history of pneumonia (recurrent): Secondary | ICD-10-CM | POA: Diagnosis not present

## 2016-09-02 DIAGNOSIS — I5031 Acute diastolic (congestive) heart failure: Secondary | ICD-10-CM | POA: Diagnosis not present

## 2016-09-02 DIAGNOSIS — I10 Essential (primary) hypertension: Secondary | ICD-10-CM | POA: Diagnosis not present

## 2016-09-02 DIAGNOSIS — I4891 Unspecified atrial fibrillation: Secondary | ICD-10-CM | POA: Diagnosis not present

## 2016-09-02 DIAGNOSIS — G4733 Obstructive sleep apnea (adult) (pediatric): Secondary | ICD-10-CM

## 2016-09-02 DIAGNOSIS — I110 Hypertensive heart disease with heart failure: Secondary | ICD-10-CM | POA: Diagnosis not present

## 2016-09-02 DIAGNOSIS — I5033 Acute on chronic diastolic (congestive) heart failure: Secondary | ICD-10-CM | POA: Diagnosis not present

## 2016-09-02 DIAGNOSIS — Z7901 Long term (current) use of anticoagulants: Secondary | ICD-10-CM | POA: Diagnosis not present

## 2016-09-02 NOTE — Patient Instructions (Signed)
Start with diet changes (monitoring salt intake and fried food).

## 2016-09-02 NOTE — Progress Notes (Signed)
Patient: Hannah Faulkner Female    DOB: [birth year]   76 y.o.   MRN: [id number] Visit Date: 09/02/2016  Today's Provider: Wilhemena Durie, MD   Chief Complaint  Patient presents with  . Hospitalization Follow-up   Subjective:    HPI  Follow up Hospitalization   Patient was admitted to [facility] on 08/26/2016 and discharged on 08/31/2016. She was treated for CHF and Hyperglycemia. Treatment for this included changing Lasix to 40mg  BID, increasing Metoprolol to 100mg  daily, increasing Cardizem to 120mg , and discontinuing hydralazine.  She reports good compliance with treatment. She reports this condition is Improved.       Allergies  Allergen Reactions  . Ampicillin   . Prinzide  [Lisinopril-Hydrochlorothiazide]   . Penicillins Rash    .Has patient had a PCN reaction causing immediate rash, facial/tongue/throat swelling, SOB or lightheadedness with hypotension: Unknown Has patient had a PCN reaction causing severe rash involving mucus membranes or skin necrosis: No Has patient had a PCN reaction that required hospitalization: No Has patient had a PCN reaction occurring within the last 10 years: No If all of the above answers are "NO", then may proceed with Cephalosporin use.      Current Outpatient Prescriptions:  .  ALPRAZolam (XANAX) 0.5 MG tablet, TAKE 1 TABLET BY MOUTH AT BEDTIME AS NEEDED FOR ANXIETY, Disp: 30 tablet, Rfl: 3 .  cyanocobalamin 1000 MCG tablet, Take 1,000 mcg by mouth daily., Disp: , Rfl:  .  diltiazem (CARDIZEM) 120 MG tablet, Take 120 mg by mouth 2 times daily at 12 noon and 4 pm. , Disp: , Rfl:  .  flecainide (TAMBOCOR) 50 MG tablet, Take 50 mg by mouth 2 (two) times daily. , Disp: , Rfl:  .  furosemide (LASIX) 40 MG tablet, Take 1 tablet (40 mg total) by mouth 2 (two) times daily., Disp: 60 tablet, Rfl: 3 .  gabapentin (NEURONTIN) 100 MG capsule, Take 2 capsules (200 mg total) by mouth 3 (three) times daily. (Patient taking differently: Take 100  mg by mouth 3 (three) times daily. ), Disp: 540 capsule, Rfl: 3 .  metoprolol tartrate (LOPRESSOR) 100 MG tablet, Take 1 tablet (100 mg total) by mouth 2 (two) times daily., Disp: 60 tablet, Rfl: 3 .  potassium chloride SA (K-DUR,KLOR-CON) 20 MEQ tablet, Take 1 tablet (20 mEq total) by mouth 2 (two) times daily., Disp: 60 tablet, Rfl: 3 .  PROAIR HFA 108 (90 Base) MCG/ACT inhaler, INHALE 2 INHALATIONS INTO THE LUNGS EVERY 6 (SIX) HOURS AS NEEDED FOR WHEEZING., Disp: , Rfl: 4 .  Pyridoxine HCl (VITAMIN B6) 200 MG TABS, Take 200 tablets by mouth 2 (two) times daily. , Disp: , Rfl:  .  warfarin (COUMADIN) 2 MG tablet, Take 2 mg by mouth every other day. Take 2mg  one day and take 2.5mg  next day, alternating., Disp: , Rfl:  .  warfarin (COUMADIN) 2.5 MG tablet, Take 2.5 mg by mouth every other day. Take 2mg  one day and take 2.5mg  next day, alternating., Disp: , Rfl:   Review of Systems  Constitutional: Positive for activity change and fatigue.  Eyes: Negative.   Respiratory: Positive for chest tightness and shortness of breath.   Cardiovascular: Positive for leg swelling. Negative for chest pain and palpitations.  Gastrointestinal: Negative.   Endocrine: Negative.   Musculoskeletal: Positive for myalgias.  Allergic/Immunologic: Negative.   Neurological: Positive for headaches.  Psychiatric/Behavioral: Negative.     Social History  Substance Use Topics  . Smoking  status: Never Smoker  . Smokeless tobacco: Never Used  . Alcohol use No   Objective:   BP 120/68 (BP Location: Right Arm, Patient Position: Sitting, Cuff Size: Normal)   Temp 97.8 F (36.6 C)   Resp (!) 24   Wt 207 lb (93.9 kg)   BMI 37.86 kg/m  Vitals:   09/02/16 1442  BP: 120/68  Resp: (!) 24  Temp: 97.8 F (36.6 C)  Weight: 207 lb (93.9 kg)     Physical Exam  Constitutional: She is oriented to person, place, and time. She appears well-developed and well-nourished.  HENT:  Head: Normocephalic and atraumatic.    Eyes: Conjunctivae are normal. No scleral icterus.  Neck: Normal range of motion. Neck supple. No thyromegaly present.  Cardiovascular: Normal rate, regular rhythm and normal heart sounds.   Pulmonary/Chest: Effort normal and breath sounds normal.  Abdominal: Soft. Bowel sounds are normal.  Musculoskeletal: She exhibits edema.  +1 pitting edema bilaterally.   Neurological: She is alert and oriented to person, place, and time.  Skin: Skin is warm and dry.  Psychiatric: She has a normal mood and affect. Her behavior is normal. Judgment and thought content normal.        Assessment & Plan:     1. Acute/chronic diastolic CHF (congestive heart failure) (HCC) Preserved EF. O2 91-92%. - Brain natriuretic peptide More than 25 minutes spent on visit--50% on counselling. 2. Essential hypertension   - Renal function panel 3.Fatigue Due to CHF.      I have done the exam and reviewed the above chart and it is accurate to the best of my knowledge. Development worker, community has been used in this note in any air is in the dictation or transcription are unintentional.  Wilhemena Durie, MD  [place]

## 2016-09-03 LAB — BRAIN NATRIURETIC PEPTIDE: BNP: 360.8 pg/mL — ABNORMAL HIGH (ref 0.0–100.0)

## 2016-09-03 LAB — RENAL FUNCTION PANEL
Albumin: 4.3 g/dL (ref 3.5–4.8)
BUN/Creatinine Ratio: 27 (ref 12–28)
BUN: 22 mg/dL (ref 8–27)
CALCIUM: 9.3 mg/dL (ref 8.7–10.3)
CO2: 30 mmol/L — ABNORMAL HIGH (ref 20–29)
CREATININE: 0.83 mg/dL (ref 0.57–1.00)
Chloride: 98 mmol/L (ref 96–106)
GFR calc Af Amer: 79 mL/min/{1.73_m2} (ref >59)
GFR calc non Af Amer: 69 mL/min/{1.73_m2} (ref >59)
Glucose: 96 mg/dL (ref 65–99)
PHOSPHORUS: 3.1 mg/dL (ref 2.5–4.5)
Potassium: 5 mmol/L (ref 3.5–5.2)
Sodium: 144 mmol/L (ref 134–144)

## 2016-09-04 ENCOUNTER — Encounter: Payer: Self-pay | Admitting: Family

## 2016-09-04 ENCOUNTER — Telehealth: Payer: Self-pay | Admitting: Family Medicine

## 2016-09-04 ENCOUNTER — Ambulatory Visit: Payer: Medicare Other | Attending: Family | Admitting: Family

## 2016-09-04 VITALS — BP 121/76 | HR 94 | Resp 20 | Ht 61.0 in | Wt 207.4 lb

## 2016-09-04 DIAGNOSIS — Z833 Family history of diabetes mellitus: Secondary | ICD-10-CM | POA: Insufficient documentation

## 2016-09-04 DIAGNOSIS — Z808 Family history of malignant neoplasm of other organs or systems: Secondary | ICD-10-CM | POA: Diagnosis not present

## 2016-09-04 DIAGNOSIS — I482 Chronic atrial fibrillation, unspecified: Secondary | ICD-10-CM

## 2016-09-04 DIAGNOSIS — Z9889 Other specified postprocedural states: Secondary | ICD-10-CM | POA: Insufficient documentation

## 2016-09-04 DIAGNOSIS — Z823 Family history of stroke: Secondary | ICD-10-CM | POA: Insufficient documentation

## 2016-09-04 DIAGNOSIS — I5032 Chronic diastolic (congestive) heart failure: Secondary | ICD-10-CM | POA: Insufficient documentation

## 2016-09-04 DIAGNOSIS — Z801 Family history of malignant neoplasm of trachea, bronchus and lung: Secondary | ICD-10-CM | POA: Diagnosis not present

## 2016-09-04 DIAGNOSIS — R5382 Chronic fatigue, unspecified: Secondary | ICD-10-CM | POA: Diagnosis not present

## 2016-09-04 DIAGNOSIS — I110 Hypertensive heart disease with heart failure: Secondary | ICD-10-CM | POA: Insufficient documentation

## 2016-09-04 DIAGNOSIS — Z820 Family history of epilepsy and other diseases of the nervous system: Secondary | ICD-10-CM | POA: Diagnosis not present

## 2016-09-04 DIAGNOSIS — G2581 Restless legs syndrome: Secondary | ICD-10-CM | POA: Diagnosis not present

## 2016-09-04 DIAGNOSIS — Z888 Allergy status to other drugs, medicaments and biological substances status: Secondary | ICD-10-CM | POA: Insufficient documentation

## 2016-09-04 DIAGNOSIS — Z7902 Long term (current) use of antithrombotics/antiplatelets: Secondary | ICD-10-CM | POA: Diagnosis not present

## 2016-09-04 DIAGNOSIS — R5383 Other fatigue: Secondary | ICD-10-CM | POA: Insufficient documentation

## 2016-09-04 DIAGNOSIS — Z880 Allergy status to penicillin: Secondary | ICD-10-CM | POA: Insufficient documentation

## 2016-09-04 DIAGNOSIS — Z8249 Family history of ischemic heart disease and other diseases of the circulatory system: Secondary | ICD-10-CM | POA: Diagnosis not present

## 2016-09-04 DIAGNOSIS — I10 Essential (primary) hypertension: Secondary | ICD-10-CM

## 2016-09-04 NOTE — Patient Instructions (Signed)
Continue weighing daily and call for an overnight weight gain of > 2 pounds or a weekly weight gain of >5 pounds. 

## 2016-09-04 NOTE — Progress Notes (Signed)
Patient ID: Hannah Faulkner, female    DOB: 03-30-1940, 75 y.o.   MRN: [id number]  HPI  Ms. Decesare is a 76 yo female with a PMH of atrial fibrillation, HTN, restless leg syndrome, and CHF.   Last echo on 08/07/16 which showed an EF of 60-65%.  Last admission was 08/26/16 for a heart failure exacerbation. She was up 16 pounds in the hospital with edema, SOB, and orthopnea. She was given IV lasix and diuresed 3.3 L. Admitted on 08/04/16 for acute respiratory failure due to acute on chronic CHF. She received IV lasix and one dose of metolazone with a UOP 2000 cc in 24 hr. Her IV lasix was changed to PO lasix with KCL at discharge.   She presents for an initial visit today with a chief compliant of fatigue and shortness of breath with mild exertion. Wears 2 L of O2 all the time with a humidifer. She has been having some postnasal drip from the humidifier. This is accompanied by a dry cough. She has leg swelling. Wears compression stockings during the day. Has trouble sleeping sometimes but takes alprazolam at night. She is seeing physical therapy twice a week and a home health nurse three times a week.   Past Medical History:  Diagnosis Date  . A-fib ([place])   . CHF (congestive heart failure) ([place])   . Hypertension    Past Surgical History:  Procedure Laterality Date  . EYE SURGERY    . POLYPECTOMY     colon poly premoved   Family History  Problem Relation Age of Onset  . Hypertension Mother   . Heart disease Mother   . CVA Mother   . Heart attack Mother   . Lung cancer Father   . Heart attack Sister   . CVA Sister   . Thyroid disease Sister   . Liver cancer Brother   . Thyroid disease Sister   . Thyroid disease Sister   . Diabetes Sister   . Dementia Sister   . Atrial fibrillation Sister    Social History  Substance Use Topics  . Smoking status: Never Smoker  . Smokeless tobacco: Never Used  . Alcohol use No   Allergies  Allergen Reactions  . Ampicillin   . Prinzide   [Lisinopril-Hydrochlorothiazide]   . Penicillins Rash    .Has patient had a PCN reaction causing immediate rash, facial/tongue/throat swelling, SOB or lightheadedness with hypotension: Unknown Has patient had a PCN reaction causing severe rash involving mucus membranes or skin necrosis: No Has patient had a PCN reaction that required hospitalization: No Has patient had a PCN reaction occurring within the last 10 years: No If all of the above answers are "NO", then may proceed with Cephalosporin use.    Prior to Admission medications   Medication Sig Start Date End Date Taking? Authorizing Provider  ALPRAZolam Duanne Moron) 0.5 MG tablet TAKE 1 TABLET BY MOUTH AT BEDTIME AS NEEDED FOR ANXIETY 09/01/16  Yes Jerrol Banana., MD  cyanocobalamin 1000 MCG tablet Take 1,000 mcg by mouth daily.   Yes [provider]  diltiazem (CARDIZEM) 120 MG tablet Take 120 mg by mouth 2 times daily at 12 noon and 4 pm.    Yes [provider]  flecainide (TAMBOCOR) 50 MG tablet Take 50 mg by mouth 2 (two) times daily.  11/16/13  Yes [provider]  furosemide (LASIX) 40 MG tablet Take 1 tablet (40 mg total) by mouth 2 (two) times daily. 08/31/16  Yes  Theodoro Grist, MD  gabapentin (NEURONTIN) 100 MG capsule Take 2 capsules (200 mg total) by mouth 3 (three) times daily. Patient taking differently: Take 100 mg by mouth 3 (three) times daily.  05/14/16  Yes Jerrol Banana., MD  metoprolol tartrate (LOPRESSOR) 100 MG tablet Take 1 tablet (100 mg total) by mouth 2 (two) times daily. 08/31/16  Yes Theodoro Grist, MD  potassium chloride SA (K-DUR,KLOR-CON) 20 MEQ tablet Take 1 tablet (20 mEq total) by mouth 2 (two) times daily. 08/31/16  Yes Theodoro Grist, MD  PROAIR HFA 108 [phone] Base) MCG/ACT inhaler INHALE 2 INHALATIONS INTO THE LUNGS EVERY 6 (SIX) HOURS AS NEEDED FOR WHEEZING. 04/04/15  Yes [provider]  Pyridoxine HCl (VITAMIN B6) 200 MG TABS Take 200 tablets by mouth 2 (two)  times daily.  11/16/13  Yes [provider]  warfarin (COUMADIN) 2 MG tablet Take 2 mg by mouth every other day. Take 2mg  one day and take 2.5mg  next day, alternating.   Yes [provider]  warfarin (COUMADIN) 2.5 MG tablet Take 2.5 mg by mouth every other day. Take 2mg  one day and take 2.5mg  next day, alternating. 11/16/13  Yes [provider]     Review of Systems  Constitutional: Positive for appetite change (doesn't want to eat) and fatigue.  HENT: Positive for postnasal drip. Negative for congestion and sore throat.   Eyes: Negative.   Respiratory: Positive for cough and shortness of breath (on exertion ). Negative for chest tightness.   Cardiovascular: Positive for leg swelling. Negative for chest pain and palpitations.  Gastrointestinal: Negative for abdominal distention and abdominal pain.  Endocrine: Negative.   Genitourinary: Negative.   Musculoskeletal: Negative for back pain and neck pain.  Skin: Negative.   Allergic/Immunologic: Negative.   Neurological: Positive for dizziness and light-headedness.  Hematological: Negative for adenopathy. Does not bruise/bleed easily.  Psychiatric/Behavioral: Positive for sleep disturbance (2 pillows; laying on side). Negative for dysphoric mood and suicidal ideas. The patient is not nervous/anxious.     Vitals:   09/04/16 1127  BP: 121/76  Pulse: 94  Resp: 20  SpO2: (!) 87%  Weight: 207 lb 6 oz (94.1 kg)  Height: 5\' 1"  (1.549 m)   Wt Readings from Last 3 Encounters:  09/04/16 207 lb 6 oz (94.1 kg)  09/02/16 207 lb (93.9 kg)  09/01/16 206 lb (93.4 kg)   Lab Results  Component Value Date   CREATININE 0.83 09/02/2016   CREATININE 0.57 08/30/2016   CREATININE 0.66 08/29/2016    Physical Exam  Constitutional: She is oriented to person, place, and time. She appears well-developed and well-nourished.  HENT:  Head: Normocephalic and atraumatic.  Neck: Normal range of motion. Neck supple. No JVD present.   Cardiovascular: An irregular rhythm present. Tachycardia present.   Pulmonary/Chest: Effort normal. She has no wheezes. She has no rales.  Diminished in the bases   Abdominal: Soft. She exhibits no distension. There is no tenderness.  Musculoskeletal: She exhibits edema (1+ pitting edema in bilateral lower legs). She exhibits no tenderness.  Neurological: She is alert and oriented to person, place, and time.  Skin: Skin is warm and dry.  Psychiatric: She has a normal mood and affect. Her behavior is normal. Thought content normal.  Nursing note and vitals reviewed.     Assessment & Plan 1: Chronic heart failure with preserved ejection fraction- - NYHA class III - mild fluid overloaded today  - weighing daily and writing weight down. Continue to weigh  daily and to call for an overnight weight gain > 2 pounds or a weekly weight gain > 5 pounds - using salt when cooking foods. Advised to follow a 2000 mg sodium diet. Given a cookbook and pamphlet - drinking less than 32 fluid ounces a day. Advised her to drink at least 32 fluid ounces a day and no more than 50 fluid ounces in a day.  - consider changing furosemide to torsemide or adding metolazone at next visit - saw cardiologist ([place]) 08/26/16  2: HTN- - BP looks good today  - saw PCP Rosanna Randy) on 09/02/16 and returns in July   3: Atrial fibrillation- - HR 94 bpm  - has felt when she is in afib in the past - continue on metoprolol tartrate, diltiazem, and flecainide - anticoagulated with warfarin; last INR 2.9 checked on 08/04/16  4: Restless leg- - taking gabapentin 100 mg TID - her son is concerned this could be attributed to her fatigue; recommended to talk to PCP about trying other medications  5: Fatigue- - has been dozing off at times during the day - could be a combination of recent increase in metoprolol twice a day, gabapentin three times a day, and patient having atrial fibrillation and CHF  Each medication was  verbally reviewed with the patient.   Return in 1 month or sooner if questions or concerns.   Loree Fee, PharmD 09/04/2016 12:37 PM

## 2016-09-04 NOTE — Telephone Encounter (Signed)
ok 

## 2016-09-04 NOTE — Telephone Encounter (Signed)
Hannah Faulkner with [facility] wanted to let Dr. Rosanna Randy know that she was supposed to see pt today for physical therapy eval but was asked to reschedule for tomorrow 09/05/16 b/c pt had a doctors appt today. Thanks TNP

## 2016-09-05 ENCOUNTER — Telehealth: Payer: Self-pay | Admitting: Family Medicine

## 2016-09-05 DIAGNOSIS — Z9981 Dependence on supplemental oxygen: Secondary | ICD-10-CM | POA: Diagnosis not present

## 2016-09-05 DIAGNOSIS — I110 Hypertensive heart disease with heart failure: Secondary | ICD-10-CM | POA: Diagnosis not present

## 2016-09-05 DIAGNOSIS — I4891 Unspecified atrial fibrillation: Secondary | ICD-10-CM | POA: Diagnosis not present

## 2016-09-05 DIAGNOSIS — I5033 Acute on chronic diastolic (congestive) heart failure: Secondary | ICD-10-CM | POA: Diagnosis not present

## 2016-09-05 DIAGNOSIS — Z7901 Long term (current) use of anticoagulants: Secondary | ICD-10-CM | POA: Diagnosis not present

## 2016-09-05 MED ORDER — PROCHLORPERAZINE MALEATE 10 MG PO TABS: 10.0000 mg | ORAL_TABLET | Freq: Three times a day (TID) | ORAL | 1 refills | Status: DC | PRN

## 2016-09-05 NOTE — Telephone Encounter (Signed)
Ok for compazine (prochlorperazine) 25mg  one every eight hours as needed for nausea. #30, rf x 1.

## 2016-09-05 NOTE — Telephone Encounter (Signed)
Patient's home health nurse called stating that the patient is still having nausea and that while she was in the hospital they had given her compazine 25mg  one every eight hours as needed for nausea and it worked good for her so she is wanting to know if we can send this in for her.  This is a Dr. Rosanna Randy patient.  She uses [facility] in [place].

## 2016-09-05 NOTE — Telephone Encounter (Signed)
Rx for compazine 10 mg tab sent to pharmacy.

## 2016-09-08 ENCOUNTER — Telehealth: Payer: Self-pay | Admitting: Family Medicine

## 2016-09-08 ENCOUNTER — Other Ambulatory Visit: Payer: Self-pay

## 2016-09-08 DIAGNOSIS — Z9981 Dependence on supplemental oxygen: Secondary | ICD-10-CM | POA: Diagnosis not present

## 2016-09-08 DIAGNOSIS — I110 Hypertensive heart disease with heart failure: Secondary | ICD-10-CM | POA: Diagnosis not present

## 2016-09-08 DIAGNOSIS — Z7901 Long term (current) use of anticoagulants: Secondary | ICD-10-CM | POA: Diagnosis not present

## 2016-09-08 DIAGNOSIS — I5033 Acute on chronic diastolic (congestive) heart failure: Secondary | ICD-10-CM | POA: Diagnosis not present

## 2016-09-08 DIAGNOSIS — I4891 Unspecified atrial fibrillation: Secondary | ICD-10-CM | POA: Diagnosis not present

## 2016-09-08 MED ORDER — METOLAZONE 2.5 MG PO TABS: 2.5000 mg | ORAL_TABLET | Freq: Every day | ORAL | 12 refills | Status: DC

## 2016-09-08 NOTE — Telephone Encounter (Signed)
ok 

## 2016-09-08 NOTE — Progress Notes (Signed)
Advised  ED 

## 2016-09-08 NOTE — Progress Notes (Unsigned)
rox

## 2016-09-08 NOTE — Telephone Encounter (Signed)
[name] at [facility] called for verbal plan of care for PT.  Please call back at [phone]  Thanks teri

## 2016-09-08 NOTE — Telephone Encounter (Signed)
Advised  ED 

## 2016-09-09 ENCOUNTER — Telehealth: Payer: Self-pay

## 2016-09-09 DIAGNOSIS — I5031 Acute diastolic (congestive) heart failure: Secondary | ICD-10-CM | POA: Diagnosis not present

## 2016-09-09 DIAGNOSIS — Z8701 Personal history of pneumonia (recurrent): Secondary | ICD-10-CM | POA: Diagnosis not present

## 2016-09-09 DIAGNOSIS — Z7901 Long term (current) use of anticoagulants: Secondary | ICD-10-CM | POA: Diagnosis not present

## 2016-09-09 DIAGNOSIS — I110 Hypertensive heart disease with heart failure: Secondary | ICD-10-CM | POA: Diagnosis not present

## 2016-09-09 DIAGNOSIS — I4891 Unspecified atrial fibrillation: Secondary | ICD-10-CM | POA: Diagnosis not present

## 2016-09-09 DIAGNOSIS — I482 Chronic atrial fibrillation: Secondary | ICD-10-CM | POA: Diagnosis not present

## 2016-09-09 DIAGNOSIS — Z9981 Dependence on supplemental oxygen: Secondary | ICD-10-CM | POA: Diagnosis not present

## 2016-09-09 DIAGNOSIS — I5033 Acute on chronic diastolic (congestive) heart failure: Secondary | ICD-10-CM | POA: Diagnosis not present

## 2016-09-09 DIAGNOSIS — R739 Hyperglycemia, unspecified: Secondary | ICD-10-CM | POA: Diagnosis not present

## 2016-09-09 DIAGNOSIS — F419 Anxiety disorder, unspecified: Secondary | ICD-10-CM | POA: Diagnosis not present

## 2016-09-09 NOTE — Telephone Encounter (Signed)
Ms. Wards son contacted the office in regards to an increase in weight of 5 lbs within the week. I advised him to contact her cardiologists office about making changes to diuretic as Darylene Price is out of the office this week.  He states he will contact Dr. Saralyn Pilar office.

## 2016-09-10 ENCOUNTER — Other Ambulatory Visit: Payer: Self-pay

## 2016-09-10 DIAGNOSIS — Z9981 Dependence on supplemental oxygen: Secondary | ICD-10-CM | POA: Diagnosis not present

## 2016-09-10 DIAGNOSIS — I5033 Acute on chronic diastolic (congestive) heart failure: Secondary | ICD-10-CM | POA: Diagnosis not present

## 2016-09-10 DIAGNOSIS — I4891 Unspecified atrial fibrillation: Secondary | ICD-10-CM | POA: Diagnosis not present

## 2016-09-10 DIAGNOSIS — Z7901 Long term (current) use of anticoagulants: Secondary | ICD-10-CM | POA: Diagnosis not present

## 2016-09-10 DIAGNOSIS — I110 Hypertensive heart disease with heart failure: Secondary | ICD-10-CM | POA: Diagnosis not present

## 2016-09-11 DIAGNOSIS — Z9981 Dependence on supplemental oxygen: Secondary | ICD-10-CM | POA: Diagnosis not present

## 2016-09-11 DIAGNOSIS — I4891 Unspecified atrial fibrillation: Secondary | ICD-10-CM | POA: Diagnosis not present

## 2016-09-11 DIAGNOSIS — Z7901 Long term (current) use of anticoagulants: Secondary | ICD-10-CM | POA: Diagnosis not present

## 2016-09-11 DIAGNOSIS — I5033 Acute on chronic diastolic (congestive) heart failure: Secondary | ICD-10-CM | POA: Diagnosis not present

## 2016-09-11 DIAGNOSIS — I110 Hypertensive heart disease with heart failure: Secondary | ICD-10-CM | POA: Diagnosis not present

## 2016-09-12 ENCOUNTER — Other Ambulatory Visit: Payer: Self-pay

## 2016-09-12 DIAGNOSIS — I5033 Acute on chronic diastolic (congestive) heart failure: Secondary | ICD-10-CM | POA: Diagnosis not present

## 2016-09-12 DIAGNOSIS — I110 Hypertensive heart disease with heart failure: Secondary | ICD-10-CM | POA: Diagnosis not present

## 2016-09-12 DIAGNOSIS — Z9981 Dependence on supplemental oxygen: Secondary | ICD-10-CM | POA: Diagnosis not present

## 2016-09-12 DIAGNOSIS — Z7901 Long term (current) use of anticoagulants: Secondary | ICD-10-CM | POA: Diagnosis not present

## 2016-09-12 DIAGNOSIS — I4891 Unspecified atrial fibrillation: Secondary | ICD-10-CM | POA: Diagnosis not present

## 2016-09-12 NOTE — Patient Outreach (Signed)
[facility]) Care Management  09/12/2016  Hannah Faulkner [birth year] [id number]  Late entry for 09/10/16 Transition of care  Week # 1 Referral date: 09/09/16 Referral source:  [place] health care transition of care Program Insurance: [employer] care  Transition of care will be completed by patients primary care providers office who will refer to [facility] care management if needed.   PLAN:  No further follow up needed by this RNCM at this time.   Quinn Plowman RN,BSN,CCM [facility] Telephonic  [phone]

## 2016-09-12 NOTE — Patient Outreach (Signed)
[facility]) Care Management  09/12/2016  Hannah Faulkner October 15, 1940 [id number]  Late entry for 09/10/16 Transition of care  Week # 1 Referral date: 09/09/16 Referral source:  [place] health care transition of care Program Insurance: [employer] care Telephone call to patient for transition of care follow up. Unable to reach patient. HIPAA complaint voice message left with call back phone number.   PLAN:  RNCM will attempt 2nd telephone call within 3 business days.   Quinn Plowman RN,BSN,CCM [facility] Telephonic  [phone]

## 2016-09-14 DIAGNOSIS — I5032 Chronic diastolic (congestive) heart failure: Secondary | ICD-10-CM | POA: Diagnosis not present

## 2016-09-15 DIAGNOSIS — Z9981 Dependence on supplemental oxygen: Secondary | ICD-10-CM | POA: Diagnosis not present

## 2016-09-15 DIAGNOSIS — I5033 Acute on chronic diastolic (congestive) heart failure: Secondary | ICD-10-CM | POA: Diagnosis not present

## 2016-09-15 DIAGNOSIS — Z7901 Long term (current) use of anticoagulants: Secondary | ICD-10-CM | POA: Diagnosis not present

## 2016-09-15 DIAGNOSIS — I110 Hypertensive heart disease with heart failure: Secondary | ICD-10-CM | POA: Diagnosis not present

## 2016-09-15 DIAGNOSIS — I4891 Unspecified atrial fibrillation: Secondary | ICD-10-CM | POA: Diagnosis not present

## 2016-09-17 DIAGNOSIS — I5033 Acute on chronic diastolic (congestive) heart failure: Secondary | ICD-10-CM | POA: Diagnosis not present

## 2016-09-17 DIAGNOSIS — I110 Hypertensive heart disease with heart failure: Secondary | ICD-10-CM | POA: Diagnosis not present

## 2016-09-17 DIAGNOSIS — E876 Hypokalemia: Secondary | ICD-10-CM | POA: Diagnosis not present

## 2016-09-17 DIAGNOSIS — Z7901 Long term (current) use of anticoagulants: Secondary | ICD-10-CM | POA: Diagnosis not present

## 2016-09-17 DIAGNOSIS — E860 Dehydration: Secondary | ICD-10-CM | POA: Diagnosis not present

## 2016-09-17 DIAGNOSIS — Z9981 Dependence on supplemental oxygen: Secondary | ICD-10-CM | POA: Diagnosis not present

## 2016-09-17 DIAGNOSIS — I4891 Unspecified atrial fibrillation: Secondary | ICD-10-CM | POA: Diagnosis not present

## 2016-09-19 DIAGNOSIS — Z9981 Dependence on supplemental oxygen: Secondary | ICD-10-CM | POA: Diagnosis not present

## 2016-09-19 DIAGNOSIS — I110 Hypertensive heart disease with heart failure: Secondary | ICD-10-CM | POA: Diagnosis not present

## 2016-09-19 DIAGNOSIS — I5033 Acute on chronic diastolic (congestive) heart failure: Secondary | ICD-10-CM | POA: Diagnosis not present

## 2016-09-19 DIAGNOSIS — I4891 Unspecified atrial fibrillation: Secondary | ICD-10-CM | POA: Diagnosis not present

## 2016-09-19 DIAGNOSIS — Z7901 Long term (current) use of anticoagulants: Secondary | ICD-10-CM | POA: Diagnosis not present

## 2016-09-22 ENCOUNTER — Ambulatory Visit: Payer: Self-pay | Admitting: Family

## 2016-09-23 DIAGNOSIS — I110 Hypertensive heart disease with heart failure: Secondary | ICD-10-CM | POA: Diagnosis not present

## 2016-09-23 DIAGNOSIS — Z7901 Long term (current) use of anticoagulants: Secondary | ICD-10-CM | POA: Diagnosis not present

## 2016-09-23 DIAGNOSIS — I4891 Unspecified atrial fibrillation: Secondary | ICD-10-CM | POA: Diagnosis not present

## 2016-09-23 DIAGNOSIS — Z9981 Dependence on supplemental oxygen: Secondary | ICD-10-CM | POA: Diagnosis not present

## 2016-09-23 DIAGNOSIS — I5033 Acute on chronic diastolic (congestive) heart failure: Secondary | ICD-10-CM | POA: Diagnosis not present

## 2016-09-25 DIAGNOSIS — Z9981 Dependence on supplemental oxygen: Secondary | ICD-10-CM | POA: Diagnosis not present

## 2016-09-25 DIAGNOSIS — I4891 Unspecified atrial fibrillation: Secondary | ICD-10-CM | POA: Diagnosis not present

## 2016-09-25 DIAGNOSIS — I5033 Acute on chronic diastolic (congestive) heart failure: Secondary | ICD-10-CM | POA: Diagnosis not present

## 2016-09-25 DIAGNOSIS — Z7901 Long term (current) use of anticoagulants: Secondary | ICD-10-CM | POA: Diagnosis not present

## 2016-09-25 DIAGNOSIS — I110 Hypertensive heart disease with heart failure: Secondary | ICD-10-CM | POA: Diagnosis not present

## 2016-09-26 DIAGNOSIS — Z9981 Dependence on supplemental oxygen: Secondary | ICD-10-CM | POA: Diagnosis not present

## 2016-09-26 DIAGNOSIS — I110 Hypertensive heart disease with heart failure: Secondary | ICD-10-CM | POA: Diagnosis not present

## 2016-09-26 DIAGNOSIS — I5033 Acute on chronic diastolic (congestive) heart failure: Secondary | ICD-10-CM | POA: Diagnosis not present

## 2016-09-26 DIAGNOSIS — Z7901 Long term (current) use of anticoagulants: Secondary | ICD-10-CM | POA: Diagnosis not present

## 2016-09-26 DIAGNOSIS — I4891 Unspecified atrial fibrillation: Secondary | ICD-10-CM | POA: Diagnosis not present

## 2016-09-29 DIAGNOSIS — I4891 Unspecified atrial fibrillation: Secondary | ICD-10-CM | POA: Diagnosis not present

## 2016-09-29 DIAGNOSIS — I110 Hypertensive heart disease with heart failure: Secondary | ICD-10-CM | POA: Diagnosis not present

## 2016-09-29 DIAGNOSIS — I5033 Acute on chronic diastolic (congestive) heart failure: Secondary | ICD-10-CM | POA: Diagnosis not present

## 2016-09-29 DIAGNOSIS — Z9981 Dependence on supplemental oxygen: Secondary | ICD-10-CM | POA: Diagnosis not present

## 2016-09-29 DIAGNOSIS — Z7901 Long term (current) use of anticoagulants: Secondary | ICD-10-CM | POA: Diagnosis not present

## 2016-09-30 DIAGNOSIS — E876 Hypokalemia: Secondary | ICD-10-CM | POA: Diagnosis not present

## 2016-09-30 DIAGNOSIS — E860 Dehydration: Secondary | ICD-10-CM | POA: Diagnosis not present

## 2016-09-30 DIAGNOSIS — I110 Hypertensive heart disease with heart failure: Secondary | ICD-10-CM | POA: Diagnosis not present

## 2016-09-30 DIAGNOSIS — Z7901 Long term (current) use of anticoagulants: Secondary | ICD-10-CM | POA: Diagnosis not present

## 2016-09-30 DIAGNOSIS — Z9981 Dependence on supplemental oxygen: Secondary | ICD-10-CM | POA: Diagnosis not present

## 2016-09-30 DIAGNOSIS — I4891 Unspecified atrial fibrillation: Secondary | ICD-10-CM | POA: Diagnosis not present

## 2016-09-30 DIAGNOSIS — I5033 Acute on chronic diastolic (congestive) heart failure: Secondary | ICD-10-CM | POA: Diagnosis not present

## 2016-10-01 ENCOUNTER — Ambulatory Visit (INDEPENDENT_AMBULATORY_CARE_PROVIDER_SITE_OTHER): Payer: Medicare Other | Admitting: Family Medicine

## 2016-10-01 VITALS — BP 122/72 | HR 103 | Temp 98.5°F | Resp 16 | Wt 186.0 lb

## 2016-10-01 DIAGNOSIS — G4733 Obstructive sleep apnea (adult) (pediatric): Secondary | ICD-10-CM

## 2016-10-01 DIAGNOSIS — I5031 Acute diastolic (congestive) heart failure: Secondary | ICD-10-CM

## 2016-10-01 DIAGNOSIS — I482 Chronic atrial fibrillation, unspecified: Secondary | ICD-10-CM

## 2016-10-01 DIAGNOSIS — E538 Deficiency of other specified B group vitamins: Secondary | ICD-10-CM | POA: Diagnosis not present

## 2016-10-01 DIAGNOSIS — E0842 Diabetes mellitus due to underlying condition with diabetic polyneuropathy: Secondary | ICD-10-CM

## 2016-10-01 DIAGNOSIS — I10 Essential (primary) hypertension: Secondary | ICD-10-CM

## 2016-10-01 NOTE — Progress Notes (Signed)
Hannah Faulkner  MRN: [id number] DOB: 08/22/1940  Subjective:  HPI  Patient is here for 1 month follow up on CHF. last office visit was on 09/02/16 after hospital stay. Renal and BNP was checked at that time. BNP level was 360.8. Patient is wearing Oxygen and uses oxygen all day and at night at 2 liters unless patient is out in town oxygen is set then at 3 to 4 liters. Patient is still getting tired easily, has a cough-with slight productivity. Patient is getting physical therapy and nursing at home. Wt Readings from Last 3 Encounters:  10/01/16 186 lb (84.4 kg)  09/04/16 207 lb 6 oz (94.1 kg)  09/02/16 207 lb (93.9 kg)   BP Readings from Last 3 Encounters:  10/01/16 122/72  09/04/16 121/76  09/02/16 120/68   Patient Active Problem List   Diagnosis Date Noted  . Chronic diastolic heart failure ([place]) 09/04/2016  . Restless legs 09/04/2016  . Fatigue 09/04/2016  . Sterile pyuria 08/31/2016  . Hyperglycemia 08/31/2016  . Community acquired pneumonia 08/04/2016  . Tricuspid regurgitation 05/15/2015  . Mitral valve regurgitation 05/15/2015  . Anxiety 08/25/2014  . Atrial fibrillation, chronic ([place]) 08/25/2014  . Benign neoplasm of colon 08/25/2014  . 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 Diabetic neuropathy ([place]) 08/25/2014  . Essential hypertension 08/25/2014  . 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 Adiposity 08/25/2014  . Obstructive apnea 08/25/2014  . Hypercholesterolemia without hypertriglyceridemia 08/25/2014  . B12 deficiency 08/25/2014  . H/O cardiac catheterization 07/15/2013  . BP (high blood pressure) 07/15/2013  . Apnea, sleep 07/15/2013  . AF (paroxysmal atrial fibrillation) ([place]) 07/15/2013    Past Medical History:  Diagnosis Date  . A-fib ([place])   . CHF (congestive heart failure) ([place])   . Hypertension     Social History   Social History  . Marital status: Widowed    Spouse name: N/A    . Number of children: 3  . Years of education: HS   Occupational History  .  Retired   Social History Main Topics  . Smoking status: Never Smoker  . Smokeless tobacco: Never Used  . Alcohol use No  . Drug use: No  . Sexual activity: Not on file   Other Topics Concern  . Not on file   Social History Narrative  . No narrative on file    Outpatient Encounter Prescriptions as of 10/01/2016  Medication Sig Note  . ALPRAZolam (XANAX) 0.5 MG tablet TAKE 1 TABLET BY MOUTH AT BEDTIME AS NEEDED FOR ANXIETY 09/04/2016: Every night   . cyanocobalamin 1000 MCG tablet Take 1,000 mcg by mouth daily.   Marland Kitchen diltiazem (CARDIZEM) 120 MG tablet Take 120 mg by mouth 2 times daily at 12 noon and 4 pm.    . flecainide (TAMBOCOR) 50 MG tablet Take 50 mg by mouth 2 (two) times daily.    . furosemide (LASIX) 40 MG tablet Take 1 tablet (40 mg total) by mouth 2 (two) times daily.   Marland Kitchen gabapentin (NEURONTIN) 100 MG capsule Take 2 capsules (200 mg total) by mouth 3 (three) times daily. (Patient taking differently: Take 100 mg by mouth 3 (three) times daily. )   . metoprolol tartrate (LOPRESSOR) 100 MG tablet Take 1 tablet (100 mg total) by mouth 2 (two) times daily.   . potassium chloride SA (K-DUR,KLOR-CON) 20 MEQ tablet Take 1 tablet (20 mEq total) by mouth 2 (two) times daily.   Marland Kitchen  PROAIR HFA 108 (90 Base) MCG/ACT inhaler INHALE 2 INHALATIONS INTO THE LUNGS EVERY 6 (SIX) HOURS AS NEEDED FOR WHEEZING.   Marland Kitchen prochlorperazine (COMPAZINE) 10 MG tablet Take 1 tablet (10 mg total) by mouth every 8 (eight) hours as needed for nausea or vomiting.   . Pyridoxine HCl (VITAMIN B6) 200 MG TABS Take 200 tablets by mouth 2 (two) times daily.    Marland Kitchen warfarin (COUMADIN) 2 MG tablet Take 2 mg by mouth every other day. Take 2mg  one day and take 2.5mg  next day, alternating.   . warfarin (COUMADIN) 2.5 MG tablet Take 2.5 mg by mouth every other day. Take 2mg  one day and take 2.5mg  next day, alternating. 08/26/2016: 2.5 mg taken 08/25/16.   Marland Kitchen metolazone (ZAROXOLYN) 2.5 MG tablet Take 1 tablet (2.5 mg total) by mouth daily. (Patient not taking: Reported on 10/01/2016)    No facility-administered encounter medications on file as of 10/01/2016.     Allergies  Allergen Reactions  . Ampicillin   . Prinzide  [Lisinopril-Hydrochlorothiazide]   . Penicillins Rash    .Has patient had a PCN reaction causing immediate rash, facial/tongue/throat swelling, SOB or lightheadedness with hypotension: Unknown Has patient had a PCN reaction causing severe rash involving mucus membranes or skin necrosis: No Has patient had a PCN reaction that required hospitalization: No Has patient had a PCN reaction occurring within the last 10 years: No If all of the above answers are "NO", then may proceed with Cephalosporin use.     Review of Systems  Constitutional: Positive for malaise/fatigue.  Respiratory: Positive for cough, sputum production and shortness of breath.   Cardiovascular: Positive for palpitations (sometimes) and leg swelling. Negative for chest pain.  Gastrointestinal: Positive for nausea (sometimes). Negative for abdominal pain, blood in stool, constipation, diarrhea and vomiting.  Musculoskeletal: Negative.   Neurological: Positive for weakness.       Unsteady gait at times and uses a cane to help.  Endo/Heme/Allergies: Bruises/bleeds easily.    Objective:  BP 122/72   Pulse (!) 103   Temp 98.5 F (36.9 C)   Resp 16   Wt 186 lb (84.4 kg)   SpO2 94% Comment: on 2 liters of oxygen  BMI 35.14 kg/m   Physical Exam  Constitutional: She is well-developed, well-nourished, and in no distress.  HENT:  Head: Normocephalic and atraumatic.  Eyes: Conjunctivae are normal. Pupils are equal, round, and reactive to light.  Neck: Normal range of motion. Neck supple.  Cardiovascular:  No murmur heard. In atrial fib today  Pulmonary/Chest: Effort normal and breath sounds normal. No respiratory distress. She has no wheezes.   Musculoskeletal: She exhibits edema (1+).  Neurological: She is alert.    Assessment and Plan :  1. Acute diastolic CHF (congestive heart failure) (HCC) Stable. Continue with oxygen use. Keep appointment with Dr Lorinda Creed on 11/20/16. Patient advised fatigue/weakness with gradually improve and to be patient. Labs were drawn on 09/30/16 by home health nurse to check potassium and kidneys. Patient lost 21 lbs in 1 month due to diuretic use, follow. 2. Chronic atrial fibrillation (HCC) In atrial fibrillation today. Patient advised at this time not to drive, will follow and re access in the future. 3. Diabetic polyneuropathy associated with diabetes mellitus due to underlying condition ([place]) Too soon to check A1C today.More than 50-% of 38minute visit spent in counelling. 4. Essential hypertension Stable. Continue current medication.  5. B12 deficiency OSA Patient advised to call Dr Rich Number back and get sleep study scheduled. Expressed  the importance of this to the patient.  HPI, Exam and A&P transcribed by Theressa Millard, RMA under direction and in the presence of Miguel Aschoff, MD. I have done the exam and reviewed the chart and it is accurate to the best of my knowledge. Development worker, community has been used and  any errors in dictation or transcription are unintentional. Miguel Aschoff M.D. [facility]

## 2016-10-02 DIAGNOSIS — Z9981 Dependence on supplemental oxygen: Secondary | ICD-10-CM | POA: Diagnosis not present

## 2016-10-02 DIAGNOSIS — Z7901 Long term (current) use of anticoagulants: Secondary | ICD-10-CM | POA: Diagnosis not present

## 2016-10-02 DIAGNOSIS — I110 Hypertensive heart disease with heart failure: Secondary | ICD-10-CM | POA: Diagnosis not present

## 2016-10-02 DIAGNOSIS — I5033 Acute on chronic diastolic (congestive) heart failure: Secondary | ICD-10-CM | POA: Diagnosis not present

## 2016-10-02 DIAGNOSIS — I4891 Unspecified atrial fibrillation: Secondary | ICD-10-CM | POA: Diagnosis not present

## 2016-10-06 DIAGNOSIS — Z7901 Long term (current) use of anticoagulants: Secondary | ICD-10-CM | POA: Diagnosis not present

## 2016-10-06 DIAGNOSIS — R739 Hyperglycemia, unspecified: Secondary | ICD-10-CM | POA: Diagnosis not present

## 2016-10-06 DIAGNOSIS — I110 Hypertensive heart disease with heart failure: Secondary | ICD-10-CM | POA: Diagnosis not present

## 2016-10-06 DIAGNOSIS — I482 Chronic atrial fibrillation: Secondary | ICD-10-CM | POA: Diagnosis not present

## 2016-10-06 DIAGNOSIS — I5031 Acute diastolic (congestive) heart failure: Secondary | ICD-10-CM | POA: Diagnosis not present

## 2016-10-06 DIAGNOSIS — Z9981 Dependence on supplemental oxygen: Secondary | ICD-10-CM | POA: Diagnosis not present

## 2016-10-08 DIAGNOSIS — Z7901 Long term (current) use of anticoagulants: Secondary | ICD-10-CM | POA: Diagnosis not present

## 2016-10-08 DIAGNOSIS — I5031 Acute diastolic (congestive) heart failure: Secondary | ICD-10-CM | POA: Diagnosis not present

## 2016-10-08 DIAGNOSIS — Z9981 Dependence on supplemental oxygen: Secondary | ICD-10-CM | POA: Diagnosis not present

## 2016-10-08 DIAGNOSIS — R739 Hyperglycemia, unspecified: Secondary | ICD-10-CM | POA: Diagnosis not present

## 2016-10-08 DIAGNOSIS — I482 Chronic atrial fibrillation: Secondary | ICD-10-CM | POA: Diagnosis not present

## 2016-10-08 DIAGNOSIS — I110 Hypertensive heart disease with heart failure: Secondary | ICD-10-CM | POA: Diagnosis not present

## 2016-10-09 ENCOUNTER — Telehealth: Payer: Self-pay | Admitting: Family Medicine

## 2016-10-09 NOTE — Telephone Encounter (Signed)
See below please. [id number]

## 2016-10-09 NOTE — Telephone Encounter (Signed)
Hannah Faulkner with [facility] wanted to advise that she plans on discharging pt from physical therapy today b/c she anticipates all goal to have been met. Please advise. Thanks TNP

## 2016-10-10 DIAGNOSIS — I5031 Acute diastolic (congestive) heart failure: Secondary | ICD-10-CM | POA: Diagnosis not present

## 2016-10-10 DIAGNOSIS — Z9981 Dependence on supplemental oxygen: Secondary | ICD-10-CM | POA: Diagnosis not present

## 2016-10-10 DIAGNOSIS — R739 Hyperglycemia, unspecified: Secondary | ICD-10-CM | POA: Diagnosis not present

## 2016-10-10 DIAGNOSIS — I482 Chronic atrial fibrillation: Secondary | ICD-10-CM | POA: Diagnosis not present

## 2016-10-10 DIAGNOSIS — I110 Hypertensive heart disease with heart failure: Secondary | ICD-10-CM | POA: Diagnosis not present

## 2016-10-10 DIAGNOSIS — Z7901 Long term (current) use of anticoagulants: Secondary | ICD-10-CM | POA: Diagnosis not present

## 2016-10-10 NOTE — Telephone Encounter (Signed)
ok 

## 2016-10-13 DIAGNOSIS — R739 Hyperglycemia, unspecified: Secondary | ICD-10-CM | POA: Diagnosis not present

## 2016-10-13 DIAGNOSIS — I5031 Acute diastolic (congestive) heart failure: Secondary | ICD-10-CM | POA: Diagnosis not present

## 2016-10-13 DIAGNOSIS — I110 Hypertensive heart disease with heart failure: Secondary | ICD-10-CM | POA: Diagnosis not present

## 2016-10-13 DIAGNOSIS — I482 Chronic atrial fibrillation: Secondary | ICD-10-CM | POA: Diagnosis not present

## 2016-10-13 DIAGNOSIS — Z9981 Dependence on supplemental oxygen: Secondary | ICD-10-CM | POA: Diagnosis not present

## 2016-10-13 DIAGNOSIS — Z7901 Long term (current) use of anticoagulants: Secondary | ICD-10-CM | POA: Diagnosis not present

## 2016-10-14 DIAGNOSIS — I5032 Chronic diastolic (congestive) heart failure: Secondary | ICD-10-CM | POA: Diagnosis not present

## 2016-10-16 DIAGNOSIS — R739 Hyperglycemia, unspecified: Secondary | ICD-10-CM | POA: Diagnosis not present

## 2016-10-16 DIAGNOSIS — E860 Dehydration: Secondary | ICD-10-CM | POA: Diagnosis not present

## 2016-10-16 DIAGNOSIS — I482 Chronic atrial fibrillation: Secondary | ICD-10-CM | POA: Diagnosis not present

## 2016-10-16 DIAGNOSIS — I5031 Acute diastolic (congestive) heart failure: Secondary | ICD-10-CM | POA: Diagnosis not present

## 2016-10-16 DIAGNOSIS — I110 Hypertensive heart disease with heart failure: Secondary | ICD-10-CM | POA: Diagnosis not present

## 2016-10-16 DIAGNOSIS — I10 Essential (primary) hypertension: Secondary | ICD-10-CM | POA: Diagnosis not present

## 2016-10-16 DIAGNOSIS — Z9981 Dependence on supplemental oxygen: Secondary | ICD-10-CM | POA: Diagnosis not present

## 2016-10-16 DIAGNOSIS — Z7901 Long term (current) use of anticoagulants: Secondary | ICD-10-CM | POA: Diagnosis not present

## 2016-10-20 ENCOUNTER — Encounter: Payer: Self-pay | Admitting: Family

## 2016-10-20 ENCOUNTER — Ambulatory Visit: Payer: Medicare Other | Attending: Family | Admitting: Family

## 2016-10-20 VITALS — BP 99/50 | HR 110 | Resp 18 | Ht 61.0 in | Wt 183.1 lb

## 2016-10-20 DIAGNOSIS — I10 Essential (primary) hypertension: Secondary | ICD-10-CM

## 2016-10-20 DIAGNOSIS — Z801 Family history of malignant neoplasm of trachea, bronchus and lung: Secondary | ICD-10-CM | POA: Diagnosis not present

## 2016-10-20 DIAGNOSIS — G2581 Restless legs syndrome: Secondary | ICD-10-CM | POA: Insufficient documentation

## 2016-10-20 DIAGNOSIS — Z823 Family history of stroke: Secondary | ICD-10-CM | POA: Diagnosis not present

## 2016-10-20 DIAGNOSIS — Z8249 Family history of ischemic heart disease and other diseases of the circulatory system: Secondary | ICD-10-CM | POA: Diagnosis not present

## 2016-10-20 DIAGNOSIS — I480 Paroxysmal atrial fibrillation: Secondary | ICD-10-CM

## 2016-10-20 DIAGNOSIS — I4891 Unspecified atrial fibrillation: Secondary | ICD-10-CM | POA: Insufficient documentation

## 2016-10-20 DIAGNOSIS — Z833 Family history of diabetes mellitus: Secondary | ICD-10-CM | POA: Diagnosis not present

## 2016-10-20 DIAGNOSIS — I5032 Chronic diastolic (congestive) heart failure: Secondary | ICD-10-CM | POA: Insufficient documentation

## 2016-10-20 DIAGNOSIS — Z7901 Long term (current) use of anticoagulants: Secondary | ICD-10-CM | POA: Insufficient documentation

## 2016-10-20 DIAGNOSIS — I110 Hypertensive heart disease with heart failure: Secondary | ICD-10-CM | POA: Insufficient documentation

## 2016-10-20 DIAGNOSIS — Z8349 Family history of other endocrine, nutritional and metabolic diseases: Secondary | ICD-10-CM | POA: Insufficient documentation

## 2016-10-20 DIAGNOSIS — Z880 Allergy status to penicillin: Secondary | ICD-10-CM | POA: Diagnosis not present

## 2016-10-20 DIAGNOSIS — Z79899 Other long term (current) drug therapy: Secondary | ICD-10-CM | POA: Diagnosis not present

## 2016-10-20 DIAGNOSIS — I509 Heart failure, unspecified: Secondary | ICD-10-CM | POA: Diagnosis present

## 2016-10-20 MED ORDER — POTASSIUM CHLORIDE CRYS ER 20 MEQ PO TBCR: 30.0000 meq | EXTENDED_RELEASE_TABLET | Freq: Two times a day (BID) | ORAL | 5 refills | Status: DC

## 2016-10-20 NOTE — Progress Notes (Signed)
Patient ID: Hannah Faulkner, female    DOB: 04-Mar-1941, 76 y.o.   MRN: [id number]  HPI  Ms. Service is a 76 yo female with a PMH of atrial fibrillation, HTN, restless leg syndrome, and CHF.   Last echo on 08/07/16 which showed an EF of 60-65%.  Last admission was 08/26/16 for a heart failure exacerbation. She was up 16 pounds in the hospital with edema, SOB, and orthopnea. She was given IV lasix and diuresed 3.3 L. Admitted on 08/04/16 for acute respiratory failure due to acute on chronic CHF. She received IV lasix and one dose of metolazone with a UOP 2000 cc in 24 hr. Her IV lasix was changed to PO lasix with KCL at discharge.   She presents for a follow-up visit with a chief complaint of mild fatigue with minimal exertion. She describes this as having been present for years with varying levels of severity. She has associated cough and edema along with this.   Past Medical History:  Diagnosis Date  . A-fib ([place])   . CHF (congestive heart failure) ([place])   . Hypertension    Past Surgical History:  Procedure Laterality Date  . EYE SURGERY    . POLYPECTOMY     colon poly premoved   Family History  Problem Relation Age of Onset  . Hypertension Mother   . Heart disease Mother   . CVA Mother   . Heart attack Mother   . Lung cancer Father   . Heart attack Sister   . CVA Sister   . Thyroid disease Sister   . Liver cancer Brother   . Thyroid disease Sister   . Thyroid disease Sister   . Diabetes Sister   . Dementia Sister   . Atrial fibrillation Sister    Social History  Substance Use Topics  . Smoking status: Never Smoker  . Smokeless tobacco: Never Used  . Alcohol use No   Allergies  Allergen Reactions  . Ampicillin   . Prinzide  [Lisinopril-Hydrochlorothiazide]   . Penicillins Rash    .Has patient had a PCN reaction causing immediate rash, facial/tongue/throat swelling, SOB or lightheadedness with hypotension: Unknown Has patient had a PCN reaction causing severe rash  involving mucus membranes or skin necrosis: No Has patient had a PCN reaction that required hospitalization: No Has patient had a PCN reaction occurring within the last 10 years: No If all of the above answers are "NO", then may proceed with Cephalosporin use.    Prior to Admission medications   Medication Sig Start Date End Date Taking? Authorizing Provider  ALPRAZolam Duanne Moron) 0.5 MG tablet TAKE 1 TABLET BY MOUTH AT BEDTIME AS NEEDED FOR ANXIETY 09/01/16  Yes Jerrol Banana., MD  cyanocobalamin 1000 MCG tablet Take 1,000 mcg by mouth daily.   Yes [provider]  diltiazem (CARDIZEM) 120 MG tablet Take 120 mg by mouth 2 times daily at 12 noon and 4 pm.    Yes [provider]  flecainide (TAMBOCOR) 50 MG tablet Take 50 mg by mouth 2 (two) times daily.  11/16/13  Yes [provider]  furosemide (LASIX) 40 MG tablet Take 1 tablet (40 mg total) by mouth 2 (two) times daily. 08/31/16  Yes Theodoro Grist, MD  gabapentin (NEURONTIN) 100 MG capsule Take 100 mg by mouth 3 (three) times daily.   Yes [provider]  metoprolol tartrate (LOPRESSOR) 100 MG tablet Take 1 tablet (100 mg total) by mouth 2 (two) times daily. 08/31/16  Yes Theodoro Grist, MD  potassium chloride SA (K-DUR,KLOR-CON) 20 MEQ tablet Take 1.5 tablets (30 mEq total) by mouth 2 (two) times daily. 10/20/16  Yes Kamorie Aldous A, FNP  PROAIR HFA 108 (90 Base) MCG/ACT inhaler INHALE 2 INHALATIONS INTO THE LUNGS EVERY 6 (SIX) HOURS AS NEEDED FOR WHEEZING. 04/04/15  Yes [provider]  prochlorperazine (COMPAZINE) 10 MG tablet Take 1 tablet (10 mg total) by mouth every 8 (eight) hours as needed for nausea or vomiting. 09/05/16  Yes Birdie Sons, MD  Pyridoxine HCl (VITAMIN B6) 200 MG TABS Take 200 tablets by mouth 2 (two) times daily.  11/16/13  Yes [provider]  warfarin (COUMADIN) 2 MG tablet Take 2 mg by mouth every other day. Take 2mg  one day and take 2.5mg  next day, alternating.    Yes [provider]  warfarin (COUMADIN) 2.5 MG tablet Take 2.5 mg by mouth every other day. Take 2mg  one day and take 2.5mg  next day, alternating. 11/16/13  Yes [provider]   Review of Systems  Constitutional: Positive for fatigue. Negative for appetite change.  HENT: Negative for congestion, postnasal drip and sore throat.   Eyes: Negative.   Respiratory: Positive for cough. Negative for chest tightness and shortness of breath.   Cardiovascular: Positive for leg swelling. Negative for chest pain and palpitations.  Gastrointestinal: Positive for abdominal pain and nausea. Negative for abdominal distention.  Endocrine: Negative.   Genitourinary: Negative.   Musculoskeletal: Negative for back pain and neck pain.  Skin: Negative.   Allergic/Immunologic: Negative.   Neurological: Negative for dizziness and light-headedness.  Hematological: Negative for adenopathy. Does not bruise/bleed easily.  Psychiatric/Behavioral: Negative for dysphoric mood and sleep disturbance (wearing oxygen on 2L). The patient is not nervous/anxious.    Vitals:   10/20/16 1602  BP: (!) 99/50  Pulse: (!) 110  Resp: 18  SpO2: 93%  Weight: 183 lb 2 oz (83.1 kg)  Height: 5\' 1"  (1.549 m)   Wt Readings from Last 3 Encounters:  10/20/16 183 lb 2 oz (83.1 kg)  10/01/16 186 lb (84.4 kg)  09/04/16 207 lb 6 oz (94.1 kg)   Lab Results  Component Value Date   CREATININE 0.83 09/02/2016   CREATININE 0.57 08/30/2016   CREATININE 0.66 08/29/2016    Physical Exam  Constitutional: She is oriented to person, place, and time. She appears well-developed and well-nourished.  HENT:  Head: Normocephalic and atraumatic.  Neck: Normal range of motion. Neck supple. No JVD present.  Cardiovascular: Regular rhythm.  Tachycardia present.   Pulmonary/Chest: Effort normal. She has no wheezes. She has no rales.  Abdominal: Soft. She exhibits no distension. There is no tenderness.  Musculoskeletal: She  exhibits edema. She exhibits no tenderness.  Neurological: She is alert and oriented to person, place, and time.  Skin: Skin is warm and dry.  Psychiatric: She has a normal mood and affect. Her behavior is normal. Thought content normal.  Nursing note and vitals reviewed.  Assessment & Plan 1: Chronic heart failure with preserved ejection fraction- - NYHA class III - euvolemic today - weighing daily and writing weight down. Continue to weigh daily and to call for an overnight weight gain > 2 pounds or a weekly weight gain > 5 pounds; by our scale, she has lost 16 pounds since she was last here - using salt when cooking foods. Advised to follow a 2000 mg sodium diet.  - she has been trying to increase her fluid intake - consider changing  furosemide to torsemide but patient feels like she has "a lot" of furosemide left at home; will try to confirm with home health nurse quantity of furosemide - saw cardiologist ([place]) 08/26/16 - BMP from 09/02/16 reviewed and shows potassium 5.0, sodium 144, GFR 69 and BNP 360.8 - had labs drawn 10/16/16 from [facility] so will try to get those results  2: HTN- - BP on the low side today  - saw PCP Rosanna Randy) on 10/01/16   3: Atrial fibrillation- - HR 110 bpm  - has felt when she is in afib in the past - continue on metoprolol tartrate, diltiazem, and flecainide - anticoagulated with warfarin; last INR 2.37 checked on 08/31/16  Patient did not bring her medications nor a list. Each medication was verbally reviewed with the patient and she was encouraged to bring the bottles to every visit to confirm accuracy of list.  Return in 1 month or sooner for any questions/problems before then.

## 2016-10-20 NOTE — Patient Instructions (Signed)
Continue weighing daily and call for an overnight weight gain of > 2 pounds or a weekly weight gain of >5 pounds. 

## 2016-10-21 DIAGNOSIS — Z7901 Long term (current) use of anticoagulants: Secondary | ICD-10-CM | POA: Diagnosis not present

## 2016-10-21 DIAGNOSIS — R739 Hyperglycemia, unspecified: Secondary | ICD-10-CM | POA: Diagnosis not present

## 2016-10-21 DIAGNOSIS — Z9981 Dependence on supplemental oxygen: Secondary | ICD-10-CM | POA: Diagnosis not present

## 2016-10-21 DIAGNOSIS — I482 Chronic atrial fibrillation: Secondary | ICD-10-CM | POA: Diagnosis not present

## 2016-10-21 DIAGNOSIS — I5031 Acute diastolic (congestive) heart failure: Secondary | ICD-10-CM | POA: Diagnosis not present

## 2016-10-21 DIAGNOSIS — I110 Hypertensive heart disease with heart failure: Secondary | ICD-10-CM | POA: Diagnosis not present

## 2016-10-23 DIAGNOSIS — I110 Hypertensive heart disease with heart failure: Secondary | ICD-10-CM | POA: Diagnosis not present

## 2016-10-23 DIAGNOSIS — R739 Hyperglycemia, unspecified: Secondary | ICD-10-CM | POA: Diagnosis not present

## 2016-10-23 DIAGNOSIS — I482 Chronic atrial fibrillation: Secondary | ICD-10-CM | POA: Diagnosis not present

## 2016-10-23 DIAGNOSIS — I5031 Acute diastolic (congestive) heart failure: Secondary | ICD-10-CM | POA: Diagnosis not present

## 2016-10-23 DIAGNOSIS — Z7901 Long term (current) use of anticoagulants: Secondary | ICD-10-CM | POA: Diagnosis not present

## 2016-10-23 DIAGNOSIS — Z9981 Dependence on supplemental oxygen: Secondary | ICD-10-CM | POA: Diagnosis not present

## 2016-10-24 ENCOUNTER — Telehealth: Payer: Self-pay | Admitting: Family

## 2016-10-24 NOTE — Telephone Encounter (Signed)
Spoke with patient regarding lab work that was drawn on 10/16/16. Potassium level is low at 3.0 and patient is currently taking 42meq potassium BID. Advised patient that I wanted her to increase her potassium to 23meq BID. Patient says that she was already told to also start taking magnesium supplement.  Asked her to have her home health nurse call us next week so that we can order a BMP to be drawn in a week with results faxed to [place].   Also called and left a message on her son's voicemail about potassium change. She says that Gwyndolyn Saxon helps set her pillbox up.

## 2016-10-26 ENCOUNTER — Other Ambulatory Visit: Payer: Self-pay | Admitting: Family Medicine

## 2016-10-27 ENCOUNTER — Telehealth: Payer: Self-pay

## 2016-10-27 NOTE — Telephone Encounter (Signed)
Hannah Faulkner with [facility] contacted to office to report that Hannah Faulkner has had a 7 lb weight gain since Thursday. She states that Thursday Ms. Delker weighed 184 lbs and today she weighted 187 lbs. She does report increased shortness of breath, cough and 2-3+ pitting edema in her lower extremities as well as distention in the abdomen. She denies any chest pain at this time.   Please advise.

## 2016-10-27 NOTE — Telephone Encounter (Signed)
Spoke with Judeen Hammans the [facility] nurse. Advised that Ms. Mance is to take 2.5 mg of Metolazone daily for the next three days. She is also to take 60 meq of potassium in the AM with Metolazone and 40 meq in the evening, per [facility] FNP. Judeen Hammans is going to advise son of changes and reports that patient has a supply of metolazone so no RX is needed.   Judeen Hammans will draw a BMP on 10/30/2016.

## 2016-10-28 DIAGNOSIS — R739 Hyperglycemia, unspecified: Secondary | ICD-10-CM | POA: Diagnosis not present

## 2016-10-28 DIAGNOSIS — Z9981 Dependence on supplemental oxygen: Secondary | ICD-10-CM | POA: Diagnosis not present

## 2016-10-28 DIAGNOSIS — I5031 Acute diastolic (congestive) heart failure: Secondary | ICD-10-CM | POA: Diagnosis not present

## 2016-10-28 DIAGNOSIS — Z7901 Long term (current) use of anticoagulants: Secondary | ICD-10-CM | POA: Diagnosis not present

## 2016-10-28 DIAGNOSIS — I110 Hypertensive heart disease with heart failure: Secondary | ICD-10-CM | POA: Diagnosis not present

## 2016-10-28 DIAGNOSIS — I482 Chronic atrial fibrillation: Secondary | ICD-10-CM | POA: Diagnosis not present

## 2016-10-30 DIAGNOSIS — E876 Hypokalemia: Secondary | ICD-10-CM | POA: Diagnosis not present

## 2016-10-30 DIAGNOSIS — E860 Dehydration: Secondary | ICD-10-CM | POA: Diagnosis not present

## 2016-10-30 DIAGNOSIS — Z7901 Long term (current) use of anticoagulants: Secondary | ICD-10-CM | POA: Diagnosis not present

## 2016-10-30 DIAGNOSIS — I110 Hypertensive heart disease with heart failure: Secondary | ICD-10-CM | POA: Diagnosis not present

## 2016-10-30 DIAGNOSIS — I482 Chronic atrial fibrillation: Secondary | ICD-10-CM | POA: Diagnosis not present

## 2016-10-30 DIAGNOSIS — I5031 Acute diastolic (congestive) heart failure: Secondary | ICD-10-CM | POA: Diagnosis not present

## 2016-10-30 DIAGNOSIS — R739 Hyperglycemia, unspecified: Secondary | ICD-10-CM | POA: Diagnosis not present

## 2016-10-30 DIAGNOSIS — Z9981 Dependence on supplemental oxygen: Secondary | ICD-10-CM | POA: Diagnosis not present

## 2016-10-31 ENCOUNTER — Telehealth: Payer: Self-pay | Admitting: Family

## 2016-10-31 NOTE — Telephone Encounter (Signed)
Spoke with Judeen Hammans [facility] home health nurse) regarding patient's labs that were drawn on 10/30/16. Sodium 142, GFR 83 and potassium 2.9.  Took a course of metolazone 2.5mg  daily for 3 days and took 61meq potassium AM/ 31meq PM during those 3 days and then went back to 36meq BID.  Angelica Chessman that patient needs to take 52meq potassium BID until we get lab results rechecked on 11/04/16. These labs will be run STAT so that we can get results back that day. Judeen Hammans says that she will call patient and patient's son with change of potassium dosage.

## 2016-11-04 DIAGNOSIS — Z9981 Dependence on supplemental oxygen: Secondary | ICD-10-CM | POA: Diagnosis not present

## 2016-11-04 DIAGNOSIS — I5031 Acute diastolic (congestive) heart failure: Secondary | ICD-10-CM | POA: Diagnosis not present

## 2016-11-04 DIAGNOSIS — I110 Hypertensive heart disease with heart failure: Secondary | ICD-10-CM | POA: Diagnosis not present

## 2016-11-04 DIAGNOSIS — I482 Chronic atrial fibrillation: Secondary | ICD-10-CM | POA: Diagnosis not present

## 2016-11-04 DIAGNOSIS — R739 Hyperglycemia, unspecified: Secondary | ICD-10-CM | POA: Diagnosis not present

## 2016-11-04 DIAGNOSIS — Z7901 Long term (current) use of anticoagulants: Secondary | ICD-10-CM | POA: Diagnosis not present

## 2016-11-05 ENCOUNTER — Telehealth: Payer: Self-pay | Admitting: Family

## 2016-11-05 DIAGNOSIS — E860 Dehydration: Secondary | ICD-10-CM | POA: Diagnosis not present

## 2016-11-05 DIAGNOSIS — E875 Hyperkalemia: Secondary | ICD-10-CM | POA: Diagnosis not present

## 2016-11-05 NOTE — Telephone Encounter (Signed)
Hannah Faulkner called to make sure we had received lab results that were drawn STAT yesterday (11/04/16) . Renal function looks good but potassium is now 6.5 although it is noted that the specimen was received hemolyzed. Previous potassium level was 2.9 on 10/30/16.  Hannah Faulkner says that patient has been experiencing diarrhea this week. Continues to take 65meq potassium BID along with 40mg  furosemide BID.   Hannah Faulkner is going to redraw labs today and have them run STAT to verify potassium level.

## 2016-11-06 ENCOUNTER — Telehealth: Payer: Self-pay | Admitting: Family

## 2016-11-06 DIAGNOSIS — R739 Hyperglycemia, unspecified: Secondary | ICD-10-CM | POA: Diagnosis not present

## 2016-11-06 DIAGNOSIS — I110 Hypertensive heart disease with heart failure: Secondary | ICD-10-CM | POA: Diagnosis not present

## 2016-11-06 DIAGNOSIS — I5031 Acute diastolic (congestive) heart failure: Secondary | ICD-10-CM | POA: Diagnosis not present

## 2016-11-06 DIAGNOSIS — I482 Chronic atrial fibrillation: Secondary | ICD-10-CM | POA: Diagnosis not present

## 2016-11-06 DIAGNOSIS — Z7901 Long term (current) use of anticoagulants: Secondary | ICD-10-CM | POA: Diagnosis not present

## 2016-11-06 DIAGNOSIS — Z9981 Dependence on supplemental oxygen: Secondary | ICD-10-CM | POA: Diagnosis not present

## 2016-11-06 NOTE — Telephone Encounter (Signed)
BMP redrawn on 11/05/16 reviewed with Judeen Hammans ([place]). Advised that potassium is now 4.3 and renal function is good.   Judeen Hammans says that when patient was having diarrhea, they held her potassium and have since resumed it at 80meq BID.   Will continue potassium at 78meq BID and she will recheck a BMP on 11/13/16

## 2016-11-11 ENCOUNTER — Telehealth: Payer: Self-pay | Admitting: Family

## 2016-11-11 DIAGNOSIS — I110 Hypertensive heart disease with heart failure: Secondary | ICD-10-CM | POA: Diagnosis not present

## 2016-11-11 DIAGNOSIS — Z7901 Long term (current) use of anticoagulants: Secondary | ICD-10-CM | POA: Diagnosis not present

## 2016-11-11 DIAGNOSIS — I5031 Acute diastolic (congestive) heart failure: Secondary | ICD-10-CM | POA: Diagnosis not present

## 2016-11-11 DIAGNOSIS — Z9981 Dependence on supplemental oxygen: Secondary | ICD-10-CM | POA: Diagnosis not present

## 2016-11-11 DIAGNOSIS — R739 Hyperglycemia, unspecified: Secondary | ICD-10-CM | POA: Diagnosis not present

## 2016-11-11 DIAGNOSIS — I482 Chronic atrial fibrillation: Secondary | ICD-10-CM | POA: Diagnosis not present

## 2016-11-11 NOTE — Telephone Encounter (Signed)
Returned call to [facility]) regarding patient. Judeen Hammans says that patient has gained 5 pounds in one week and has abdominal distention, cough and worsening shortness of breath. Patient unable to come into the office this week for an appointment because her son is currently out of town and she didn't think she could get a ride here.   Currently taking furosemide 40mg  BID along with potassium 4meq BID.   Angelica Chessman that I wanted to add 5mg  metolazone daily for the next 3 days along with an extra 74meq potassium daily for the next 3 days as well.   Judeen Hammans will draw a BMP on 11/13/16 as previously ordered

## 2016-11-13 DIAGNOSIS — R739 Hyperglycemia, unspecified: Secondary | ICD-10-CM | POA: Diagnosis not present

## 2016-11-13 DIAGNOSIS — I482 Chronic atrial fibrillation: Secondary | ICD-10-CM | POA: Diagnosis not present

## 2016-11-13 DIAGNOSIS — I5031 Acute diastolic (congestive) heart failure: Secondary | ICD-10-CM | POA: Diagnosis not present

## 2016-11-13 DIAGNOSIS — Z7901 Long term (current) use of anticoagulants: Secondary | ICD-10-CM | POA: Diagnosis not present

## 2016-11-13 DIAGNOSIS — I110 Hypertensive heart disease with heart failure: Secondary | ICD-10-CM | POA: Diagnosis not present

## 2016-11-13 DIAGNOSIS — Z9981 Dependence on supplemental oxygen: Secondary | ICD-10-CM | POA: Diagnosis not present

## 2016-11-14 DIAGNOSIS — I5032 Chronic diastolic (congestive) heart failure: Secondary | ICD-10-CM | POA: Diagnosis not present

## 2016-11-17 DIAGNOSIS — E876 Hypokalemia: Secondary | ICD-10-CM | POA: Diagnosis not present

## 2016-11-18 DIAGNOSIS — I110 Hypertensive heart disease with heart failure: Secondary | ICD-10-CM | POA: Diagnosis not present

## 2016-11-18 DIAGNOSIS — Z7901 Long term (current) use of anticoagulants: Secondary | ICD-10-CM | POA: Diagnosis not present

## 2016-11-18 DIAGNOSIS — I482 Chronic atrial fibrillation: Secondary | ICD-10-CM | POA: Diagnosis not present

## 2016-11-18 DIAGNOSIS — R739 Hyperglycemia, unspecified: Secondary | ICD-10-CM | POA: Diagnosis not present

## 2016-11-18 DIAGNOSIS — Z9981 Dependence on supplemental oxygen: Secondary | ICD-10-CM | POA: Diagnosis not present

## 2016-11-18 DIAGNOSIS — I5031 Acute diastolic (congestive) heart failure: Secondary | ICD-10-CM | POA: Diagnosis not present

## 2016-11-19 ENCOUNTER — Telehealth: Payer: Self-pay | Admitting: Family

## 2016-11-19 NOTE — Telephone Encounter (Signed)
Spoke with Judeen Hammans [facility]) about lab results drawn on 11/17/16. Renal function looks good but potassium is back down to 3.0. She finished a 4 day course of metolazone/potassium last week. Currently taking 46meq potassium BID.  Angelica Chessman to have patient take an additional 23meq potassium today in addition to her 52meq BID. Will plan on rechecking labs tomorrow.

## 2016-11-20 ENCOUNTER — Encounter: Payer: Self-pay | Admitting: Family

## 2016-11-20 ENCOUNTER — Ambulatory Visit: Payer: Medicare Other | Attending: Family | Admitting: Family

## 2016-11-20 VITALS — BP 123/82 | HR 86 | Resp 18 | Ht 61.0 in | Wt 181.2 lb

## 2016-11-20 DIAGNOSIS — I10 Essential (primary) hypertension: Secondary | ICD-10-CM

## 2016-11-20 DIAGNOSIS — Z8249 Family history of ischemic heart disease and other diseases of the circulatory system: Secondary | ICD-10-CM | POA: Diagnosis not present

## 2016-11-20 DIAGNOSIS — I482 Chronic atrial fibrillation, unspecified: Secondary | ICD-10-CM

## 2016-11-20 DIAGNOSIS — Z800 Family history of malignant neoplasm of digestive organs: Secondary | ICD-10-CM | POA: Diagnosis not present

## 2016-11-20 DIAGNOSIS — Z801 Family history of malignant neoplasm of trachea, bronchus and lung: Secondary | ICD-10-CM | POA: Insufficient documentation

## 2016-11-20 DIAGNOSIS — Z888 Allergy status to other drugs, medicaments and biological substances status: Secondary | ICD-10-CM | POA: Diagnosis not present

## 2016-11-20 DIAGNOSIS — Z79899 Other long term (current) drug therapy: Secondary | ICD-10-CM | POA: Diagnosis not present

## 2016-11-20 DIAGNOSIS — G2581 Restless legs syndrome: Secondary | ICD-10-CM | POA: Diagnosis not present

## 2016-11-20 DIAGNOSIS — G4733 Obstructive sleep apnea (adult) (pediatric): Secondary | ICD-10-CM | POA: Diagnosis not present

## 2016-11-20 DIAGNOSIS — I480 Paroxysmal atrial fibrillation: Secondary | ICD-10-CM | POA: Diagnosis not present

## 2016-11-20 DIAGNOSIS — Z8349 Family history of other endocrine, nutritional and metabolic diseases: Secondary | ICD-10-CM | POA: Diagnosis not present

## 2016-11-20 DIAGNOSIS — E1140 Type 2 diabetes mellitus with diabetic neuropathy, unspecified: Secondary | ICD-10-CM | POA: Diagnosis not present

## 2016-11-20 DIAGNOSIS — Z7901 Long term (current) use of anticoagulants: Secondary | ICD-10-CM | POA: Insufficient documentation

## 2016-11-20 DIAGNOSIS — R609 Edema, unspecified: Secondary | ICD-10-CM | POA: Insufficient documentation

## 2016-11-20 DIAGNOSIS — I890 Lymphedema, not elsewhere classified: Secondary | ICD-10-CM | POA: Insufficient documentation

## 2016-11-20 DIAGNOSIS — Z833 Family history of diabetes mellitus: Secondary | ICD-10-CM | POA: Diagnosis not present

## 2016-11-20 DIAGNOSIS — I110 Hypertensive heart disease with heart failure: Secondary | ICD-10-CM | POA: Insufficient documentation

## 2016-11-20 DIAGNOSIS — Z880 Allergy status to penicillin: Secondary | ICD-10-CM | POA: Diagnosis not present

## 2016-11-20 DIAGNOSIS — I4891 Unspecified atrial fibrillation: Secondary | ICD-10-CM | POA: Diagnosis not present

## 2016-11-20 DIAGNOSIS — E876 Hypokalemia: Secondary | ICD-10-CM | POA: Diagnosis not present

## 2016-11-20 DIAGNOSIS — Z823 Family history of stroke: Secondary | ICD-10-CM | POA: Diagnosis not present

## 2016-11-20 DIAGNOSIS — I5032 Chronic diastolic (congestive) heart failure: Secondary | ICD-10-CM | POA: Diagnosis not present

## 2016-11-20 LAB — BASIC METABOLIC PANEL WITH GFR
Anion gap: 9 (ref 5–15)
BUN: 15 mg/dL (ref 6–20)
CO2: 32 mmol/L (ref 22–32)
Calcium: 9.2 mg/dL (ref 8.9–10.3)
Chloride: 98 mmol/L — ABNORMAL LOW (ref 101–111)
Creatinine, Ser: 0.69 mg/dL (ref 0.44–1.00)
GFR calc Af Amer: >60 mL/min
GFR calc non Af Amer: >60 mL/min
Glucose, Bld: 99 mg/dL (ref 65–99)
Potassium: 4.5 mmol/L (ref 3.5–5.1)
Sodium: 139 mmol/L (ref 135–145)

## 2016-11-20 NOTE — Progress Notes (Signed)
Patient ID: Hannah Faulkner, female    DOB: [birth year], 76 y.o.   MRN: [id number]  HPI  Hannah Faulkner is a 76 yo female with a PMH of atrial fibrillation, HTN, restless leg syndrome, and CHF.   Last echo on 08/07/16 which showed an EF of 60-65%.  Last admission was 08/26/16 for a heart failure exacerbation. She was up 16 pounds in the hospital with edema, SOB, and orthopnea. She was given IV lasix and diuresed 3.3 L. Admitted on 08/04/16 for acute respiratory failure due to acute on chronic CHF. She received IV lasix and one dose of metolazone with a UOP 2000 cc in 24 hr. Her IV lasix was changed to PO lasix with KCL at discharge.   She presents for a follow-up visit with a chief complaint of mild fatigue upon moderate exertion. She describes this as chronic in nature having been present for several years with varying levels of severity. She has associated fatigue, cough and pedal edema along with this. She denies any chest pain, dizziness or weight gain.   Past Medical History:  Diagnosis Date  . A-fib ([place])   . CHF (congestive heart failure) ([place])   . Hypertension    Past Surgical History:  Procedure Laterality Date  . EYE SURGERY    . POLYPECTOMY     colon poly premoved   Family History  Problem Relation Age of Onset  . Hypertension Mother   . Heart disease Mother   . CVA Mother   . Heart attack Mother   . Lung cancer Father   . Heart attack Sister   . CVA Sister   . Thyroid disease Sister   . Liver cancer Brother   . Thyroid disease Sister   . Thyroid disease Sister   . Diabetes Sister   . Dementia Sister   . Atrial fibrillation Sister    Social History  Substance Use Topics  . Smoking status: Never Smoker  . Smokeless tobacco: Never Used  . Alcohol use No   Allergies  Allergen Reactions  . Ampicillin   . Prinzide  [Lisinopril-Hydrochlorothiazide]   . Penicillins Rash    .Has patient had a PCN reaction causing immediate rash, facial/tongue/throat swelling, SOB or  lightheadedness with hypotension: Unknown Has patient had a PCN reaction causing severe rash involving mucus membranes or skin necrosis: No Has patient had a PCN reaction that required hospitalization: No Has patient had a PCN reaction occurring within the last 10 years: No If all of the above answers are "NO", then may proceed with Cephalosporin use.    Prior to Admission medications   Medication Sig Start Date End Date Taking? Authorizing Provider  ALPRAZolam Duanne Moron) 0.5 MG tablet TAKE 1 TABLET BY MOUTH AT BEDTIME AS NEEDED FOR ANXIETY 09/01/16  Yes Jerrol Banana., MD  cyanocobalamin 1000 MCG tablet Take 1,000 mcg by mouth daily.   Yes [provider]  diltiazem (CARDIZEM) 120 MG tablet Take 120 mg by mouth 2 times daily at 12 noon and 4 pm.    Yes [provider]  flecainide (TAMBOCOR) 50 MG tablet Take 50 mg by mouth 2 (two) times daily.  11/16/13  Yes [provider]  furosemide (LASIX) 40 MG tablet Take 1 tablet (40 mg total) by mouth 2 (two) times daily. 08/31/16  Yes Theodoro Grist, MD  gabapentin (NEURONTIN) 100 MG capsule Take 100 mg by mouth 3 (three) times daily.   Yes [provider]  gabapentin (NEURONTIN) 100 MG capsule  TAKE 1 CAPSULE (100 MG TOTAL) BY MOUTH 3 (THREE) TIMES DAILY. 10/26/16  Yes Jerrol Banana., MD  metoprolol tartrate (LOPRESSOR) 100 MG tablet Take 1 tablet (100 mg total) by mouth 2 (two) times daily. 08/31/16  Yes Theodoro Grist, MD  potassium chloride SA (K-DUR,KLOR-CON) 20 MEQ tablet Take 1.5 tablets (30 mEq total) by mouth 2 (two) times daily. Patient taking differently: Take 40 mEq by mouth 2 (two) times daily.  10/20/16  Yes Jazmene Racz A, FNP  PROAIR HFA 108 (90 Base) MCG/ACT inhaler INHALE 2 INHALATIONS INTO THE LUNGS EVERY 6 (SIX) HOURS AS NEEDED FOR WHEEZING. 04/04/15  Yes [provider]  prochlorperazine (COMPAZINE) 10 MG tablet Take 1 tablet (10 mg total) by mouth every 8 (eight) hours as needed for  nausea or vomiting. 09/05/16  Yes Birdie Sons, MD  Pyridoxine HCl (VITAMIN B6) 200 MG TABS Take 200 tablets by mouth 2 (two) times daily.  11/16/13  Yes [provider]  warfarin (COUMADIN) 2 MG tablet Take 2 mg by mouth every other day. Take 2mg  one day and take 2.5mg  next day, alternating.   Yes [provider]  warfarin (COUMADIN) 2.5 MG tablet Take 2.5 mg by mouth every other day. Take 2mg  one day and take 2.5mg  next day, alternating. 11/16/13  Yes [provider]    Review of Systems  Constitutional: Positive for fatigue. Negative for appetite change.  HENT: Negative for congestion, postnasal drip and sore throat.   Eyes: Negative.   Respiratory: Positive for cough. Negative for chest tightness and shortness of breath.   Cardiovascular: Positive for leg swelling. Negative for chest pain and palpitations.  Gastrointestinal: Positive for diarrhea (last night). Negative for abdominal distention and abdominal pain.  Endocrine: Negative.   Genitourinary: Negative.   Musculoskeletal: Negative for back pain and neck pain.  Skin: Negative.   Allergic/Immunologic: Negative.   Neurological: Negative for dizziness and light-headedness.  Hematological: Negative for adenopathy. Does not bruise/bleed easily.  Psychiatric/Behavioral: Negative for dysphoric mood and sleep disturbance (wearing oxygen on 2L). The patient is not nervous/anxious.    Vitals:   11/20/16 1407  BP: 123/82  Pulse: 86  Resp: 18  SpO2: 92%  Weight: 181 lb 4 oz (82.2 kg)  Height: 5\' 1"  (1.549 m)   Wt Readings from Last 3 Encounters:  11/20/16 181 lb 4 oz (82.2 kg)  10/20/16 183 lb 2 oz (83.1 kg)  10/01/16 186 lb (84.4 kg)    Lab Results  Component Value Date   CREATININE 0.83 09/02/2016   CREATININE 0.57 08/30/2016   CREATININE 0.66 08/29/2016    Physical Exam  Constitutional: She is oriented to person, place, and time. She appears well-developed and well-nourished.  HENT:  Head:  Normocephalic and atraumatic.  Neck: Normal range of motion. Neck supple. No JVD present.  Cardiovascular: Normal rate and regular rhythm.   Pulmonary/Chest: Effort normal. She has no wheezes. She has no rales.  Abdominal: Soft. She exhibits no distension. There is no tenderness.  Musculoskeletal: She exhibits edema (1+ pitting edema in bilateral lower legs). She exhibits no tenderness.  Neurological: She is alert and oriented to person, place, and time.  Skin: Skin is warm and dry.  Psychiatric: She has a normal mood and affect. Her behavior is normal. Thought content normal.  Nursing note and vitals reviewed.  Assessment & Plan 1: Chronic heart failure with preserved ejection fraction- - NYHA class III - euvolemic today - weighing daily and writing weight down. Continue  to weigh daily and to call for an overnight weight gain > 2 pounds or a weekly weight gain > 5 pounds - weight down 2 pounds since she was last here - using salt when cooking foods. Advised to follow a 2000 mg sodium diet.  - saw cardiologist ([place]) 08/26/16  2: HTN- - BP looks good today  - saw PCP Rosanna Randy) on 10/01/16   3: Atrial fibrillation- - HR 86 bpm  - has felt when she is in afib in the past - continue on metoprolol tartrate, diltiazem, and flecainide - anticoagulated with warfarin; last INR 2.37 checked on 08/31/16  4: Hypokalemia- - took an extra 65meq potassium yesterday - currently taking 65meq BID - BMP drawn today  5: Lymphedema- - stage 1 lymphedema - elevating her legs helps decrease edema some but then it returns  - has worn compression socks in the past but inconsistently because she says that she has difficulty in putting them on - mom lost her leg due to poor circulation - discussed lymphapress boots and patient is interested so referral made  Patient did not bring her medications nor a list. Each medication was verbally reviewed with the patient and she was encouraged to bring the  bottles to every visit to confirm accuracy of list.  Return in 2 months or sooner for any questions/problems before then.

## 2016-11-21 ENCOUNTER — Telehealth: Payer: Self-pay | Admitting: Family

## 2016-11-21 DIAGNOSIS — I110 Hypertensive heart disease with heart failure: Secondary | ICD-10-CM | POA: Diagnosis not present

## 2016-11-21 DIAGNOSIS — I5031 Acute diastolic (congestive) heart failure: Secondary | ICD-10-CM | POA: Diagnosis not present

## 2016-11-21 DIAGNOSIS — R739 Hyperglycemia, unspecified: Secondary | ICD-10-CM | POA: Diagnosis not present

## 2016-11-21 DIAGNOSIS — I482 Chronic atrial fibrillation: Secondary | ICD-10-CM | POA: Diagnosis not present

## 2016-11-21 DIAGNOSIS — Z9981 Dependence on supplemental oxygen: Secondary | ICD-10-CM | POA: Diagnosis not present

## 2016-11-21 DIAGNOSIS — Z7901 Long term (current) use of anticoagulants: Secondary | ICD-10-CM | POA: Diagnosis not present

## 2016-11-21 NOTE — Telephone Encounter (Signed)
Spoke with patient regarding lab work that was drawn yesterday (11/20/16). Potassium level is now normal at 4.5. She was instructed to continue taking her potassium as 50meq BID.

## 2016-11-25 ENCOUNTER — Ambulatory Visit: Payer: Self-pay | Admitting: Family

## 2016-11-25 ENCOUNTER — Other Ambulatory Visit: Payer: Self-pay | Admitting: Family

## 2016-11-25 ENCOUNTER — Telehealth: Payer: Self-pay | Admitting: Family

## 2016-11-25 DIAGNOSIS — Z7901 Long term (current) use of anticoagulants: Secondary | ICD-10-CM | POA: Diagnosis not present

## 2016-11-25 DIAGNOSIS — R739 Hyperglycemia, unspecified: Secondary | ICD-10-CM | POA: Diagnosis not present

## 2016-11-25 DIAGNOSIS — I110 Hypertensive heart disease with heart failure: Secondary | ICD-10-CM | POA: Diagnosis not present

## 2016-11-25 DIAGNOSIS — I5031 Acute diastolic (congestive) heart failure: Secondary | ICD-10-CM | POA: Diagnosis not present

## 2016-11-25 DIAGNOSIS — I482 Chronic atrial fibrillation: Secondary | ICD-10-CM | POA: Diagnosis not present

## 2016-11-25 DIAGNOSIS — Z9981 Dependence on supplemental oxygen: Secondary | ICD-10-CM | POA: Diagnosis not present

## 2016-11-25 MED ORDER — TORSEMIDE 20 MG PO TABS: 40.0000 mg | ORAL_TABLET | Freq: Two times a day (BID) | ORAL | 3 refills | Status: DC

## 2016-11-25 NOTE — Telephone Encounter (Signed)
Hannah Faulkner from [facility] called to say that patient's weight is up 5 pounds from 11/20/16 and she now weighs 183 pounds. Patient is more short of breath and had to sleep in the chair last night.  Hannah Faulkner that I will change her diuretic to torsemide 40mg  twice daily. Stop the furosemide. Hannah Faulkner will draw a BMP on 11/27/16.  Patient says that she's following the fluid restriction of no more than 60 ounces daily and is monitoring her sodium level carefully.

## 2016-11-27 DIAGNOSIS — Z9981 Dependence on supplemental oxygen: Secondary | ICD-10-CM | POA: Diagnosis not present

## 2016-11-27 DIAGNOSIS — E876 Hypokalemia: Secondary | ICD-10-CM | POA: Diagnosis not present

## 2016-11-27 DIAGNOSIS — R739 Hyperglycemia, unspecified: Secondary | ICD-10-CM | POA: Diagnosis not present

## 2016-11-27 DIAGNOSIS — I110 Hypertensive heart disease with heart failure: Secondary | ICD-10-CM | POA: Diagnosis not present

## 2016-11-27 DIAGNOSIS — I5031 Acute diastolic (congestive) heart failure: Secondary | ICD-10-CM | POA: Diagnosis not present

## 2016-11-27 DIAGNOSIS — I482 Chronic atrial fibrillation: Secondary | ICD-10-CM | POA: Diagnosis not present

## 2016-11-27 DIAGNOSIS — Z7901 Long term (current) use of anticoagulants: Secondary | ICD-10-CM | POA: Diagnosis not present

## 2016-11-28 ENCOUNTER — Telehealth: Payer: Self-pay | Admitting: Family

## 2016-11-28 NOTE — Telephone Encounter (Signed)
Cletus Gash from [facility] called asking about this patient and whether we received lab results on her from 11/27/16 and if I had signed a requisition form.  Inform Cletus Gash that we had received those lab results today (11/28/16) and that I had not signed any requisition form. I had given a verbal order to patient's home health nurse to draw these labs. She has told me that she draws the lab in patient's home and then takes them to [facility]. I told him that I did not know who would be the one to fill out the requisition form as I had given the nurse a verbal order. Apparently there is an issue with what organization is being billed for these results.   Gave him the nurses name and number that has been drawing the labs. Judeen Hammans from [facility], [phone])

## 2016-12-02 DIAGNOSIS — Z7901 Long term (current) use of anticoagulants: Secondary | ICD-10-CM | POA: Diagnosis not present

## 2016-12-02 DIAGNOSIS — I482 Chronic atrial fibrillation: Secondary | ICD-10-CM | POA: Diagnosis not present

## 2016-12-02 DIAGNOSIS — R739 Hyperglycemia, unspecified: Secondary | ICD-10-CM | POA: Diagnosis not present

## 2016-12-02 DIAGNOSIS — I110 Hypertensive heart disease with heart failure: Secondary | ICD-10-CM | POA: Diagnosis not present

## 2016-12-02 DIAGNOSIS — Z9981 Dependence on supplemental oxygen: Secondary | ICD-10-CM | POA: Diagnosis not present

## 2016-12-02 DIAGNOSIS — I5031 Acute diastolic (congestive) heart failure: Secondary | ICD-10-CM | POA: Diagnosis not present

## 2016-12-04 ENCOUNTER — Telehealth: Payer: Self-pay | Admitting: Family Medicine

## 2016-12-04 ENCOUNTER — Ambulatory Visit: Payer: Self-pay | Admitting: Family Medicine

## 2016-12-04 DIAGNOSIS — Z7901 Long term (current) use of anticoagulants: Secondary | ICD-10-CM | POA: Diagnosis not present

## 2016-12-04 DIAGNOSIS — I482 Chronic atrial fibrillation: Secondary | ICD-10-CM | POA: Diagnosis not present

## 2016-12-04 DIAGNOSIS — Z9981 Dependence on supplemental oxygen: Secondary | ICD-10-CM | POA: Diagnosis not present

## 2016-12-04 DIAGNOSIS — I5031 Acute diastolic (congestive) heart failure: Secondary | ICD-10-CM | POA: Diagnosis not present

## 2016-12-04 DIAGNOSIS — I110 Hypertensive heart disease with heart failure: Secondary | ICD-10-CM | POA: Diagnosis not present

## 2016-12-04 DIAGNOSIS — R739 Hyperglycemia, unspecified: Secondary | ICD-10-CM | POA: Diagnosis not present

## 2016-12-04 NOTE — Telephone Encounter (Signed)
ok 

## 2016-12-04 NOTE — Telephone Encounter (Signed)
Please review. Ok to [employer], [place]
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
## 2016-12-04 NOTE — Telephone Encounter (Signed)
Done. Pt advised this will be faxed over-Anastasiya V Hopkins, RMA

## 2016-12-09 ENCOUNTER — Ambulatory Visit (INDEPENDENT_AMBULATORY_CARE_PROVIDER_SITE_OTHER): Payer: Medicare Other | Admitting: Internal Medicine

## 2016-12-09 ENCOUNTER — Encounter: Payer: Self-pay | Admitting: Internal Medicine

## 2016-12-09 VITALS — BP 120/80 | HR 111 | Resp 16 | Ht 61.0 in | Wt 181.0 lb

## 2016-12-09 DIAGNOSIS — J9611 Chronic respiratory failure with hypoxia: Secondary | ICD-10-CM

## 2016-12-09 NOTE — Patient Instructions (Signed)
Obtain sleep study as well as possible Continue oxygen as prescribed

## 2016-12-09 NOTE — Addendum Note (Signed)
Addended by: Devona Konig on: 12/09/2016 03:59 PM   Modules accepted: Orders

## 2016-12-09 NOTE — Progress Notes (Signed)
Name: Hannah Faulkner MRN: [id number] DOB: 06/03/1940     REFERRING MD : Reva Bores  CHIEF COMPLAINT: SOB  76 year old female with known hx of CHF transferred from the floor due to shortness of breath related to worsening Pulmonary edema/CHF Exacerbation requiring BiPAP.  SIGNIFICANT EVENTS  08/26/16 >> patient admitted with acute diastolic CHF exacerbation 08/26/16>> Patient transferred from the floor to ICU related to  Worsening pulmonary edema/ afib with RVR. 6/10 Discharged home  STUDIES:  08/07/16 ECHO>>Left ventricle: The cavity size was normal. Systolic function wasnormal. The estimated ejection fraction was in the range of 60%to 65%.  HISTORY OF PRESENT ILLNESS:   Hannah Faulkner is a 76 year old female with known history of diastolic heart failure and Atrial fibrillation.  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  She has been having chronic SOB and DOE for many months No signs of infection at this time   Patient has been having excessive daytime sleepiness Patient has been having extreme fatigue and tiredness, lack of energy Patient has been told that she has very  Loud snoring every night Patient has been told that she struggles to breathe at night and gasps for air She has not received her sleep study at this point she would like a home sleep study however I have explained to her due to her underlying medical conditions it would be safer to obtain the sleep study and lab  Previous Dx of Sleep apnea She couldn't handle the mask without humidity  She has h/o afib Multiple admissions to hosp for heart failure On oxygen since last 5 months Patient showing signs of heart failure with lower extremity edema  Patient has had progressive resp status decline over last 6 months Prognosis is guarded    Allergies  Allergen Reactions  . Ampicillin   . Prinzide  [Lisinopril-Hydrochlorothiazide]   . Penicillins Rash      .Has patient had a PCN reaction causing immediate rash, facial/tongue/throat swelling, SOB or lightheadedness with hypotension: Unknown Has patient had a PCN reaction causing severe rash involving mucus membranes or skin necrosis: No Has patient had a PCN reaction that required hospitalization: No Has patient had a PCN reaction occurring within the last 10 years: No If all of the above answers are "NO", then may proceed with Cephalosporin use.     REVIEW OF SYSTEMS:   Constitutional: Negative for fever, chills, weight loss, +malaise/fatigue  HENT: Negative for hearing loss, ear pain, nosebleeds, congestion, sore throat, neck pain, tinnitus and ear discharge.   Eyes: Negative for blurred vision, double vision, photophobia, pain, discharge and redness.  Respiratory: Negative for cough, hemoptysis, sputum production, +shortness of breath, -wheezing and -stridor.   Cardiovascular: Negative for chest pain, palpitations, orthopnea, claudication, leg swelling and PND.  Gastrointestinal: Negative for heartburn, nausea, vomiting, abdominal pain, diarrhea, constipation, blood in stool and melena.  Genitourinary: Negative for dysuria, urgency, frequency, hematuria and flank pain.  Musculoskeletal: Negative for myalgias, back pain, joint pain and falls. +edema Neurological: Negative for dizziness, tingling, tremors, sensory change, speech change, focal weakness, seizures, loss of consciousness, weakness and headaches.  Endo/Heme/Allergies: Negative for environmental allergies and polydipsia. Does not bruise/bleed easily.  ALL OTHER ROS ARE NEGATIVE  BP 120/80 (BP Location: Left Arm, Cuff Size: Normal)   Pulse (!) 111   Resp 16   Ht 5\' 1"  (1.549 m)   Wt 181 lb (82.1 kg)   SpO2 90%  BMI 34.20 kg/m   Physical Examination:   GENERAL:NAD, no fevers, chills, no weakness no fatigue HEAD: Normocephalic, atraumatic.  EYES: Pupils equal, round, reactive to light. Extraocular muscles intact. No  scleral icterus.  MOUTH: Moist mucosal membrane.   EAR, NOSE, THROAT: Clear without exudates. No external lesions.  NECK: Supple. No thyromegaly. No nodules. No JVD.  PULMONARY:CTA B/L no wheezes, no crackles, no rhonchi CARDIOVASCULAR: S1 and S2. Regular rate and rhythm. No murmurs, rubs, or gallops. + edema.  GASTROINTESTINAL: Soft, nontender, nondistended. No masses. Positive bowel sounds.  MUSCULOSKELETAL:+edema. Range of motion full in all extremities.  NEUROLOGIC: Cranial nerves II through XII are intact. No gross focal neurological deficits.  SKIN: No ulceration, lesions, rashes, or cyanosis. Skin warm and dry. Turgor intact.  PSYCHIATRIC: Mood, affect within normal limits. The patient is awake, alert and oriented x 3. Insight, judgment intact.      ASSESSMENT / PLAN: 76 yo female with severe diastolic heart failure with chronic afib in setting of morbid obesity with previous DX of OSA with deconditioned states with chronic hypoxic resp failure  1.SOB-multifactoriall, heart failure, obesity, deconditioned state Overall poor prognosis  2.Diastolic heart failure -follow up Cardiology as scheduled Patient watching her weight along with her salt intake Patient has been switched to different diuretics  3.Previus Dx of OSA -will need sleep study ASAP -patient will need aggressive therapy soon in order to help prevent further admission to the hospital I have advised patient to obtain in lab sleep study to assess for sleep apnea and get treatment right away  4.chornic hypoxic resp failure -needs oxygen as prescribed  5.afib-follow up cardiology  6.Obesity -recommend significant weight loss -recommend changing diet  7.Deconditioned state -Recommend increased daily activity and exercise      Patient/Family are satisfied with Plan of action and management. All questions answered Follow up in 3 months, patient overall has a very deconditioned and decompensated heart failure  but is not in any acute distress at this time patient with chronic respiratory failure with hypoxia I advised patient to obtain sleep study as soon as possible  Corrin Parker, M.D.  [facility]  Medical Director [place] Director [facility] Cardio-Pulmonary Department

## 2016-12-10 ENCOUNTER — Telehealth: Payer: Self-pay | Admitting: Family Medicine

## 2016-12-10 DIAGNOSIS — R739 Hyperglycemia, unspecified: Secondary | ICD-10-CM | POA: Diagnosis not present

## 2016-12-10 DIAGNOSIS — I482 Chronic atrial fibrillation: Secondary | ICD-10-CM | POA: Diagnosis not present

## 2016-12-10 DIAGNOSIS — Z9981 Dependence on supplemental oxygen: Secondary | ICD-10-CM | POA: Diagnosis not present

## 2016-12-10 DIAGNOSIS — I5031 Acute diastolic (congestive) heart failure: Secondary | ICD-10-CM | POA: Diagnosis not present

## 2016-12-10 DIAGNOSIS — Z7901 Long term (current) use of anticoagulants: Secondary | ICD-10-CM | POA: Diagnosis not present

## 2016-12-10 DIAGNOSIS — I110 Hypertensive heart disease with heart failure: Secondary | ICD-10-CM | POA: Diagnosis not present

## 2016-12-10 NOTE — Telephone Encounter (Signed)
Sherry at [facility] called to say they are discontinuing home health. Everything is stable right now.  Thanks [employer]

## 2016-12-10 NOTE — Telephone Encounter (Signed)
Please review-Hannah Faulkner [name], RMA  

## 2016-12-11 ENCOUNTER — Ambulatory Visit (INDEPENDENT_AMBULATORY_CARE_PROVIDER_SITE_OTHER): Payer: Medicare Other | Admitting: Family Medicine

## 2016-12-11 ENCOUNTER — Encounter: Payer: Self-pay | Admitting: Family Medicine

## 2016-12-11 ENCOUNTER — Other Ambulatory Visit: Payer: Self-pay | Admitting: Internal Medicine

## 2016-12-11 VITALS — BP 134/78 | HR 110 | Temp 98.6°F | Resp 20 | Wt 185.0 lb

## 2016-12-11 DIAGNOSIS — E0842 Diabetes mellitus due to underlying condition with diabetic polyneuropathy: Secondary | ICD-10-CM | POA: Diagnosis not present

## 2016-12-11 DIAGNOSIS — Z23 Encounter for immunization: Secondary | ICD-10-CM

## 2016-12-11 DIAGNOSIS — I10 Essential (primary) hypertension: Secondary | ICD-10-CM | POA: Diagnosis not present

## 2016-12-11 DIAGNOSIS — J9611 Chronic respiratory failure with hypoxia: Secondary | ICD-10-CM

## 2016-12-11 DIAGNOSIS — I482 Chronic atrial fibrillation, unspecified: Secondary | ICD-10-CM

## 2016-12-11 DIAGNOSIS — I5032 Chronic diastolic (congestive) heart failure: Secondary | ICD-10-CM

## 2016-12-11 LAB — POCT GLYCOSYLATED HEMOGLOBIN (HGB A1C)
ESTIMATED AVERAGE GLUCOSE: 140
HEMOGLOBIN A1C: 6.5

## 2016-12-11 NOTE — Progress Notes (Signed)
Patient: Hannah Faulkner Female    DOB: November 25, 1940   76 y.o.   MRN: [id number] Visit Date: 12/11/2016  Today's Provider: Wilhemena Durie, MD   Chief Complaint  Patient presents with  . Diabetes   Subjective:    HPI  Diabetes Mellitus Type II, Follow-up:   Lab Results  Component Value Date   HGBA1C 6.5 12/11/2016   HGBA1C 6.2 (H) 08/31/2016   HGBA1C 5.8 07/28/2016    Last seen for diabetes 3 months ago.  Management since then includes no changes. She reports good compliance with treatment. She is not having side effects.  Current symptoms include none and have been stable. Home blood sugar records: not being checked  Episodes of hypoglycemia? no   Current Insulin Regimen: none Most Recent Eye Exam: due Weight trend: stable Prior visit with dietician: no Current diet: well balanced Current exercise: none  Pertinent Labs:    Component Value Date/Time   CHOL 148 11/27/2015 0953   TRIG 109 11/27/2015 0953   HDL 42 11/27/2015 0953   LDLCALC 84 11/27/2015 0953   CREATININE 0.69 11/20/2016 1445    Wt Readings from Last 3 Encounters:  12/11/16 185 lb (83.9 kg)  12/09/16 181 lb (82.1 kg)  11/20/16 181 lb 4 oz (82.2 kg)     CHF, follow up: Patient was last seen in the office about 3 months ago. No changes were made in her medications. She was also seen by cardiology on 11/20/16 (1 month ago) and everything was stable.      Allergies  Allergen Reactions  . Ampicillin   . Prinzide  [Lisinopril-Hydrochlorothiazide]   . Penicillins Rash    .Has patient had a PCN reaction causing immediate rash, facial/tongue/throat swelling, SOB or lightheadedness with hypotension: Unknown Has patient had a PCN reaction causing severe rash involving mucus membranes or skin necrosis: No Has patient had a PCN reaction that required hospitalization: No Has patient had a PCN reaction occurring within the last 10 years: No If all of the above answers are "NO", then may  proceed with Cephalosporin use.      Current Outpatient Prescriptions:  .  ALPRAZolam (XANAX) 0.5 MG tablet, TAKE 1 TABLET BY MOUTH AT BEDTIME AS NEEDED FOR ANXIETY, Disp: 30 tablet, Rfl: 3 .  cyanocobalamin 1000 MCG tablet, Take 1,000 mcg by mouth daily., Disp: , Rfl:  .  diltiazem (CARDIZEM) 120 MG tablet, Take 120 mg by mouth 2 times daily at 12 noon and 4 pm. , Disp: , Rfl:  .  flecainide (TAMBOCOR) 50 MG tablet, Take 50 mg by mouth 2 (two) times daily. , Disp: , Rfl:  .  gabapentin (NEURONTIN) 100 MG capsule, TAKE 1 CAPSULE (100 MG TOTAL) BY MOUTH 3 (THREE) TIMES DAILY., Disp: 270 capsule, Rfl: 2 .  metoprolol tartrate (LOPRESSOR) 100 MG tablet, Take 1 tablet (100 mg total) by mouth 2 (two) times daily., Disp: 60 tablet, Rfl: 3 .  PROAIR HFA 108 (90 Base) MCG/ACT inhaler, INHALE 2 INHALATIONS INTO THE LUNGS EVERY 6 (SIX) HOURS AS NEEDED FOR WHEEZING., Disp: , Rfl: 4 .  prochlorperazine (COMPAZINE) 10 MG tablet, Take 1 tablet (10 mg total) by mouth every 8 (eight) hours as needed for nausea or vomiting., Disp: 30 tablet, Rfl: 1 .  Pyridoxine HCl (VITAMIN B6) 200 MG TABS, Take 200 tablets by mouth 2 (two) times daily. , Disp: , Rfl:  .  torsemide (DEMADEX) 20 MG tablet, Take 2 tablets (40 mg total) by  mouth 2 (two) times daily., Disp: 120 tablet, Rfl: 3 .  warfarin (COUMADIN) 2 MG tablet, Take 2 mg by mouth every other day. Take 2mg  one day and take 2.5mg  next day, alternating., Disp: , Rfl:  .  warfarin (COUMADIN) 2.5 MG tablet, Take 2.5 mg by mouth every other day. Take 2mg  one day and take 2.5mg  next day, alternating., Disp: , Rfl:  .  potassium chloride SA (K-DUR,KLOR-CON) 20 MEQ tablet, Take 1.5 tablets (30 mEq total) by mouth 2 (two) times daily. (Patient not taking: Reported on 12/11/2016), Disp: 90 tablet, Rfl: 5  Review of Systems  Constitutional: Negative.   HENT: Negative.   Eyes: Negative.   Respiratory: Positive for shortness of breath.        Has CHF.   Cardiovascular:  Negative.   Endocrine: Negative.   Allergic/Immunologic: Negative.   Neurological: Negative.   Hematological: Negative.   Psychiatric/Behavioral: Negative.     Social History  Substance Use Topics  . Smoking status: Never Smoker  . Smokeless tobacco: Never Used  . Alcohol use No   Objective:   BP 134/78 (BP Location: Right Arm, Patient Position: Sitting, Cuff Size: Normal)   Pulse (!) 110   Temp 98.6 F (37 C)   Resp 20   Wt 185 lb (83.9 kg)   SpO2 93% Comment: with 2L of O2  BMI 34.96 kg/m  Vitals:   12/11/16 1534  BP: 134/78  Pulse: (!) 110  Resp: 20  Temp: 98.6 F (37 C)  SpO2: 93%  Weight: 185 lb (83.9 kg)     Physical Exam  Constitutional: She is oriented to person, place, and time. She appears well-developed and well-nourished.  HENT:  Head: Normocephalic and atraumatic.  Eyes: Conjunctivae are normal. No scleral icterus.  Neck: No thyromegaly present.  Cardiovascular: Normal rate and regular rhythm.   Pulmonary/Chest: Effort normal and breath sounds normal.  Musculoskeletal: Normal range of motion. She exhibits edema.  2+ pitting edema.  Neurological: She is alert and oriented to person, place, and time.  Skin: Skin is warm and dry.  Psychiatric: She has a normal mood and affect. Her behavior is normal. Judgment and thought content normal.        Assessment & Plan:     1. Essential hypertension   2. Diabetic polyneuropathy associated with diabetes mellitus due to underlying condition ([place])  - POCT glycosylated hemoglobin (Hb A1C)  3. Atrial fibrillation, chronic ([place])   4. Chronic diastolic heart failure (HCC) Long-term prognosis is poor.       I have done the exam and reviewed the above chart and it is accurate to the best of my knowledge. Development worker, community has been used in this note in any air is in the dictation or transcription are unintentional.  Wilhemena Durie, MD  [place]

## 2016-12-11 NOTE — Progress Notes (Signed)
Order changed.

## 2016-12-12 DIAGNOSIS — I482 Chronic atrial fibrillation: Secondary | ICD-10-CM | POA: Diagnosis not present

## 2016-12-12 DIAGNOSIS — R739 Hyperglycemia, unspecified: Secondary | ICD-10-CM | POA: Diagnosis not present

## 2016-12-12 DIAGNOSIS — Z9981 Dependence on supplemental oxygen: Secondary | ICD-10-CM | POA: Diagnosis not present

## 2016-12-12 DIAGNOSIS — Z7901 Long term (current) use of anticoagulants: Secondary | ICD-10-CM | POA: Diagnosis not present

## 2016-12-12 DIAGNOSIS — I5031 Acute diastolic (congestive) heart failure: Secondary | ICD-10-CM | POA: Diagnosis not present

## 2016-12-12 DIAGNOSIS — I110 Hypertensive heart disease with heart failure: Secondary | ICD-10-CM | POA: Diagnosis not present

## 2016-12-15 DIAGNOSIS — I5032 Chronic diastolic (congestive) heart failure: Secondary | ICD-10-CM | POA: Diagnosis not present

## 2016-12-17 ENCOUNTER — Telehealth: Payer: Self-pay | Admitting: Internal Medicine

## 2016-12-17 NOTE — Telephone Encounter (Signed)
At last OV, Dr. Mortimer Fries requested that patient get Sleep Study done ASAP. Lyndsay with [facility] called today and stated that they have left messages for patient to contact [facility] to arrange study on 12/10/16, 12/12/16 and 12/17/16. Pt has not returned any of these calls to schedule this test.  A letter will be mailed to the patient to contact [facility] to schedule, however, Lyndsay wanted to make sure that Dr. Mortimer Fries was aware of the above.   Just FYI for physician. Rhonda J Cobb

## 2016-12-19 NOTE — Telephone Encounter (Signed)
Sleep Study appointment has been scheduled for 01/01/17. Nothing else needed at this time. Rhonda J Cobb

## 2016-12-20 ENCOUNTER — Other Ambulatory Visit: Payer: Self-pay | Admitting: Family Medicine

## 2016-12-29 ENCOUNTER — Encounter: Payer: Self-pay | Admitting: Family

## 2016-12-29 ENCOUNTER — Telehealth: Payer: Self-pay | Admitting: Family

## 2016-12-29 ENCOUNTER — Ambulatory Visit: Payer: Medicare Other | Attending: Family | Admitting: Family

## 2016-12-29 ENCOUNTER — Telehealth: Payer: Self-pay

## 2016-12-29 VITALS — BP 130/96 | HR 130 | Resp 20 | Ht 61.0 in | Wt 182.4 lb

## 2016-12-29 DIAGNOSIS — I110 Hypertensive heart disease with heart failure: Secondary | ICD-10-CM | POA: Diagnosis not present

## 2016-12-29 DIAGNOSIS — Z808 Family history of malignant neoplasm of other organs or systems: Secondary | ICD-10-CM | POA: Insufficient documentation

## 2016-12-29 DIAGNOSIS — Z9889 Other specified postprocedural states: Secondary | ICD-10-CM | POA: Diagnosis not present

## 2016-12-29 DIAGNOSIS — I4891 Unspecified atrial fibrillation: Secondary | ICD-10-CM | POA: Diagnosis not present

## 2016-12-29 DIAGNOSIS — Z79899 Other long term (current) drug therapy: Secondary | ICD-10-CM | POA: Diagnosis not present

## 2016-12-29 DIAGNOSIS — I5032 Chronic diastolic (congestive) heart failure: Secondary | ICD-10-CM

## 2016-12-29 DIAGNOSIS — I509 Heart failure, unspecified: Secondary | ICD-10-CM | POA: Insufficient documentation

## 2016-12-29 DIAGNOSIS — Z833 Family history of diabetes mellitus: Secondary | ICD-10-CM | POA: Diagnosis not present

## 2016-12-29 DIAGNOSIS — I890 Lymphedema, not elsewhere classified: Secondary | ICD-10-CM

## 2016-12-29 DIAGNOSIS — E876 Hypokalemia: Secondary | ICD-10-CM

## 2016-12-29 DIAGNOSIS — Z880 Allergy status to penicillin: Secondary | ICD-10-CM | POA: Insufficient documentation

## 2016-12-29 DIAGNOSIS — I10 Essential (primary) hypertension: Secondary | ICD-10-CM

## 2016-12-29 DIAGNOSIS — Z7901 Long term (current) use of anticoagulants: Secondary | ICD-10-CM | POA: Diagnosis not present

## 2016-12-29 DIAGNOSIS — Z8249 Family history of ischemic heart disease and other diseases of the circulatory system: Secondary | ICD-10-CM | POA: Insufficient documentation

## 2016-12-29 DIAGNOSIS — I480 Paroxysmal atrial fibrillation: Secondary | ICD-10-CM

## 2016-12-29 NOTE — Telephone Encounter (Signed)
Pt contacted the office to report right leg swelling and abdominal distention. She does report that  The right leg is swelling much more then the left. She states that her son is her only means of transportation and he is going out of town for the week. I advised patient come to be seen before her son leaves. Appt made for 10:40 am today.

## 2016-12-29 NOTE — Patient Instructions (Addendum)
Continue weighing daily and call for an overnight weight gain of > 2 pounds or a weekly weight gain of >5 pounds.  Call when you get home and let me know the name/dose of fluid pill that you have at home.

## 2016-12-29 NOTE — Progress Notes (Signed)
Patient ID: Hannah Faulkner, female    DOB: [birth year], 76 y.o.   MRN: [id number]  HPI  Ms. Pongratz is a 76 yo female with a PMH of atrial fibrillation, HTN, restless leg syndrome, and CHF.   Last echo on 08/07/16 which showed an EF of 60-65%.  Last admission was 08/26/16 for a heart failure exacerbation. She was up 16 pounds in the hospital with edema, SOB, and orthopnea. She was given IV lasix and diuresed 3.3 L. Admitted on 08/04/16 for acute respiratory failure due to acute on chronic CHF. She received IV lasix and one dose of metolazone with a UOP 2000 cc in 24 hr. Her IV lasix was changed to PO lasix with KCL at discharge.   She presents for a follow-up visit with a chief complaint of lower extremity edema. She says that this has been worsening over the last 3 days or so. Right leg is more swollen than the left but is chronically so. Has associated fatigue, cough and palpitations. Denies any chest pain, dizziness, leg pain or weight gain. Scheduled for a sleep study on 01/01/17.  Past Medical History:  Diagnosis Date  . A-fib ([place])   . CHF (congestive heart failure) ([place])   . Hypertension    Past Surgical History:  Procedure Laterality Date  . EYE SURGERY    . POLYPECTOMY     colon poly premoved   Family History  Problem Relation Age of Onset  . Hypertension Mother   . Heart disease Mother   . CVA Mother   . Heart attack Mother   . Lung cancer Father   . Heart attack Sister   . CVA Sister   . Thyroid disease Sister   . Liver cancer Brother   . Thyroid disease Sister   . Thyroid disease Sister   . Diabetes Sister   . Dementia Sister   . Atrial fibrillation Sister    Social History  Substance Use Topics  . Smoking status: Never Smoker  . Smokeless tobacco: Never Used  . Alcohol use No   Allergies  Allergen Reactions  . Ampicillin   . Prinzide  [Lisinopril-Hydrochlorothiazide]   . Penicillins Rash    .Has patient had a PCN reaction causing immediate rash,  facial/tongue/throat swelling, SOB or lightheadedness with hypotension: Unknown Has patient had a PCN reaction causing severe rash involving mucus membranes or skin necrosis: No Has patient had a PCN reaction that required hospitalization: No Has patient had a PCN reaction occurring within the last 10 years: No If all of the above answers are "NO", then may proceed with Cephalosporin use.    Prior to Admission medications   Medication Sig Start Date End Date Taking? Authorizing Provider  ALPRAZolam Duanne Moron) 0.5 MG tablet TAKE 1 TABLET BY MOUTH AT BEDTIME AS NEEDED FOR ANXIETY 09/01/16  Yes Jerrol Banana., MD  cyanocobalamin 1000 MCG tablet Take 1,000 mcg by mouth daily.   Yes [provider]  diltiazem (CARDIZEM) 120 MG tablet Take 120 mg by mouth 2 times daily at 12 noon and 4 pm.    Yes [provider]  flecainide (TAMBOCOR) 50 MG tablet Take 50 mg by mouth 2 (two) times daily.  11/16/13  Yes [provider]  gabapentin (NEURONTIN) 100 MG capsule TAKE 1 CAPSULE (100 MG TOTAL) BY MOUTH 3 (THREE) TIMES DAILY. 12/21/16  Yes Jerrol Banana., MD  metoprolol tartrate (LOPRESSOR) 100 MG tablet Take 1 tablet (100 mg total) by mouth 2 (two)  times daily. 08/31/16  Yes Theodoro Grist, MD  potassium chloride SA (K-DUR,KLOR-CON) 20 MEQ tablet Take 40 mEq by mouth 2 (two) times daily.   Yes [provider]  PROAIR HFA 108 (90 Base) MCG/ACT inhaler INHALE 2 INHALATIONS INTO THE LUNGS EVERY 6 (SIX) HOURS AS NEEDED FOR WHEEZING. 04/04/15  Yes [provider]  prochlorperazine (COMPAZINE) 10 MG tablet Take 1 tablet (10 mg total) by mouth every 8 (eight) hours as needed for nausea or vomiting. 09/05/16  Yes Birdie Sons, MD  Pyridoxine HCl (VITAMIN B6) 200 MG TABS Take 200 tablets by mouth 2 (two) times daily.  11/16/13  Yes [provider]  torsemide (DEMADEX) 20 MG tablet Take 2 tablets (40 mg total) by mouth 2 (two) times daily. 11/25/16 12/29/16 Yes  Darylene Price A, FNP  warfarin (COUMADIN) 2 MG tablet Take 2 mg by mouth every other day. Take 2mg  one day and take 2.5mg  next day, alternating.   Yes [provider]  warfarin (COUMADIN) 2.5 MG tablet Take 2.5 mg by mouth every other day. Take 2mg  one day and take 2.5mg  next day, alternating. 11/16/13  Yes [provider]   Review of Systems  Constitutional: Positive for fatigue. Negative for appetite change.  HENT: Negative for congestion, postnasal drip and sore throat.   Eyes: Negative.   Respiratory: Positive for cough. Negative for chest tightness and shortness of breath.   Cardiovascular: Positive for palpitations (at times) and leg swelling (right > left). Negative for chest pain.  Gastrointestinal: Positive for abdominal distention. Negative for abdominal pain and diarrhea.  Endocrine: Negative.   Genitourinary: Negative.   Musculoskeletal: Negative for arthralgias, back pain and neck pain.  Skin: Negative.   Allergic/Immunologic: Negative.   Neurological: Negative for dizziness and light-headedness.  Hematological: Negative for adenopathy. Does not bruise/bleed easily.  Psychiatric/Behavioral: Negative for dysphoric mood and sleep disturbance (wearing oxygen on 3L). The patient is not nervous/anxious.    Vitals:   12/29/16 1032  BP: (!) 130/96  Pulse: (!) 130  Resp: 20  SpO2: 91%  Weight: 182 lb 6 oz (82.7 kg)  Height: 5\' 1"  (1.549 m)   Wt Readings from Last 3 Encounters:  12/29/16 182 lb 6 oz (82.7 kg)  12/11/16 185 lb (83.9 kg)  12/09/16 181 lb (82.1 kg)    Lab Results  Component Value Date   CREATININE 0.69 11/20/2016   CREATININE 0.83 09/02/2016   CREATININE 0.57 08/30/2016    Physical Exam  Constitutional: She is oriented to person, place, and time. She appears well-developed and well-nourished.  HENT:  Head: Normocephalic and atraumatic.  Neck: Normal range of motion. Neck supple. No JVD present.  Cardiovascular: An irregular rhythm  present. Tachycardia present.   Pulmonary/Chest: Effort normal. She has no wheezes. She has no rales.  Abdominal: Soft. She exhibits no distension. There is no tenderness.  Musculoskeletal: She exhibits edema (3+ pitting edema in right lower leg and 2+ pitting in left lower leg). She exhibits no tenderness.  Neurological: She is alert and oriented to person, place, and time.  Skin: Skin is warm and dry.  Psychiatric: She has a normal mood and affect. Her behavior is normal. Thought content normal.  Nursing note and vitals reviewed.  Assessment & Plan 1: Chronic heart failure with preserved ejection fraction- - NYHA class III - moderately fluid overloaded today today - weighing daily and writing weight down. Continue to weigh daily and to call for an overnight weight gain > 2 pounds or  a weekly weight gain > 5 pounds - weight stable from the last time she was here - using salt when cooking foods. Advised to follow a 2000 mg sodium diet.  - saw cardiologist ([place]) 11/20/16 - thinks she has 2.5mg  metolazone at home. Instructed to take 2.5mg  today and tomorrow along with an extra 26meq potassium today and tomorrow as well. She will call back after she returns home to confirm dosage.  - call us in 2 days to update regarding weight and edema to determine if she needs more metolazone - discussed doing an ultrasound to rule out DVT but right leg has traditionally been larger than left, no recent travel and no pain upon palpation. Patient is in agreement with not doing it right now. Could consider if edema persists.   2: HTN- - BP slightly elevated today; adding metolazone per above  - saw PCP Rosanna Randy) on 10/01/16 & returns January 2019   3: Atrial fibrillation- - HR 130 bpm (I) today - can tell when she's in afib as she feels palpitations - continue on metoprolol tartrate, diltiazem, and flecainide - anticoagulated with warfarin; last INR 2.37 checked on 08/31/16 - needs INR checked  4:  Hypokalemia- - BMP from 11/20/16 reviewed and shows potassium level of 4.5  5: Lymphedema- - stage 2 lymphedema - elevating her legs helps decrease edema some but then it returns  - has worn compression socks in the past but inconsistently because she says that she has difficulty in putting them on - mom lost her leg due to poor circulation - needs [employer] contact information as she heard from them but then lost the phone number. Will contact company and have them call her again  Patient did not bring her medications nor a list. Each medication was verbally reviewed with the patient and she was encouraged to bring the bottles to every visit to confirm accuracy of list.  Return in 1 week or sooner for any questions/problems before then. Will check a BMP next week along with an INR if not done before then.

## 2016-12-29 NOTE — Telephone Encounter (Signed)
Returned patient's call regarding her metolazone at home. She says that she has 2.5mg  of metolazone at home already. Instructed her to take the 2.5mg  dose daily for today and tomorrow. She is to also take an additional 21meq of potassium today and tomorrow as well. She is to call back on 12/31/16 to update on her weight and swelling to see if we need to give her any more than 2 days worth of metolazone. Patient verbalized understanding.

## 2017-01-01 ENCOUNTER — Ambulatory Visit: Payer: Medicare Other | Attending: Internal Medicine

## 2017-01-02 ENCOUNTER — Other Ambulatory Visit: Payer: Self-pay | Admitting: Family Medicine

## 2017-01-05 ENCOUNTER — Telehealth: Payer: Self-pay | Admitting: Family

## 2017-01-05 ENCOUNTER — Encounter: Payer: Self-pay | Admitting: Family

## 2017-01-05 ENCOUNTER — Ambulatory Visit: Payer: Medicare Other | Attending: Family | Admitting: Family

## 2017-01-05 VITALS — BP 112/68 | HR 77 | Resp 15 | Ht 61.0 in | Wt 173.0 lb

## 2017-01-05 DIAGNOSIS — I5032 Chronic diastolic (congestive) heart failure: Secondary | ICD-10-CM | POA: Insufficient documentation

## 2017-01-05 DIAGNOSIS — I10 Essential (primary) hypertension: Secondary | ICD-10-CM

## 2017-01-05 DIAGNOSIS — I480 Paroxysmal atrial fibrillation: Secondary | ICD-10-CM

## 2017-01-05 DIAGNOSIS — Z880 Allergy status to penicillin: Secondary | ICD-10-CM | POA: Insufficient documentation

## 2017-01-05 DIAGNOSIS — G2581 Restless legs syndrome: Secondary | ICD-10-CM | POA: Insufficient documentation

## 2017-01-05 DIAGNOSIS — I890 Lymphedema, not elsewhere classified: Secondary | ICD-10-CM | POA: Diagnosis not present

## 2017-01-05 DIAGNOSIS — R0602 Shortness of breath: Secondary | ICD-10-CM | POA: Diagnosis present

## 2017-01-05 DIAGNOSIS — Z79899 Other long term (current) drug therapy: Secondary | ICD-10-CM | POA: Insufficient documentation

## 2017-01-05 DIAGNOSIS — I4891 Unspecified atrial fibrillation: Secondary | ICD-10-CM | POA: Diagnosis not present

## 2017-01-05 DIAGNOSIS — I110 Hypertensive heart disease with heart failure: Secondary | ICD-10-CM | POA: Diagnosis not present

## 2017-01-05 DIAGNOSIS — E876 Hypokalemia: Secondary | ICD-10-CM

## 2017-01-05 DIAGNOSIS — Z7901 Long term (current) use of anticoagulants: Secondary | ICD-10-CM | POA: Insufficient documentation

## 2017-01-05 LAB — BASIC METABOLIC PANEL
Anion gap: 13 (ref 5–15)
BUN: 15 mg/dL (ref 6–20)
CO2: 33 mmol/L — ABNORMAL HIGH (ref 22–32)
CREATININE: 0.86 mg/dL (ref 0.44–1.00)
Calcium: 9.3 mg/dL (ref 8.9–10.3)
Chloride: 94 mmol/L — ABNORMAL LOW (ref 101–111)
GFR calc Af Amer: >60 mL/min (ref >60)
GFR calc non Af Amer: >60 mL/min (ref >60)
Glucose, Bld: 88 mg/dL (ref 65–99)
Potassium: 3.8 mmol/L (ref 3.5–5.1)
SODIUM: 140 mmol/L (ref 135–145)

## 2017-01-05 LAB — PROTIME-INR
INR: 3.05
PROTHROMBIN TIME: 31.3 s — AB (ref 11.4–15.2)

## 2017-01-05 NOTE — Patient Instructions (Signed)
Continue weighing daily and call for an overnight weight gain of > 2 pounds or a weekly weight gain of >5 pounds. 

## 2017-01-05 NOTE — Telephone Encounter (Signed)
rx called in-Andrewjames Weirauch V Montoya Watkin, RMA  

## 2017-01-05 NOTE — Telephone Encounter (Signed)
Please review for Dr Rosario Jacks, RMA

## 2017-01-05 NOTE — Telephone Encounter (Signed)
Spoke with patient regarding lab results that were obtained on 01/05/17. Potassium and renal function all look good.  INR is slightly high at 3.05. She normally takes alternating doses of warfarin 2mg  QOD along with 2.5mg  QOD. She hasn't taken her warfarin yet today as she takes it in the evening. Advised her to not take any warfarin tonight and then continue with her normal alternating doses beginning back tomorrow. Patient verbalized understanding of above.

## 2017-01-05 NOTE — Progress Notes (Signed)
Patient ID: Hannah Faulkner, female    DOB: 1940/06/30, 76 y.o.   MRN: [id number]  HPI  Hannah Faulkner is a 76 yo female with a PMH of atrial fibrillation, HTN, restless leg syndrome, and CHF.   Last echo on 08/07/16 which showed an EF of 60-65%.  Last admission was 08/26/16 for a heart failure exacerbation. She was up 16 pounds in the hospital with edema, SOB, and orthopnea. She was given IV lasix and diuresed 3.3 L. Admitted on 08/04/16 for acute respiratory failure due to acute on chronic CHF. She received IV lasix and one dose of metolazone with a UOP 2000 cc in 24 hr. Her IV lasix was changed to PO lasix with KCL at discharge.   She presents for a follow-up visit with a chief complaint of mild shortness of breath upon moderate exertion. She describes this as chronic in nature having been present for several years with varying levels of severity. She does feel like it has improved since she took the 2 days of metolazone (2.5mg  each day). She has associated fatigue, cough and edema along with this. She denies any chest pain, palpitations, dizziness or weight gain.   Past Medical History:  Diagnosis Date  . A-fib ([place])   . CHF (congestive heart failure) ([place])   . Hypertension    Past Surgical History:  Procedure Laterality Date  . EYE SURGERY    . POLYPECTOMY     colon poly premoved   Family History  Problem Relation Age of Onset  . Hypertension Mother   . Heart disease Mother   . CVA Mother   . Heart attack Mother   . Lung cancer Father   . Heart attack Sister   . CVA Sister   . Thyroid disease Sister   . Liver cancer Brother   . Thyroid disease Sister   . Thyroid disease Sister   . Diabetes Sister   . Dementia Sister   . Atrial fibrillation Sister    Social History  Substance Use Topics  . Smoking status: Never Smoker  . Smokeless tobacco: Never Used  . Alcohol use No   Allergies  Allergen Reactions  . Ampicillin   . Prinzide  [Lisinopril-Hydrochlorothiazide]   .  Penicillins Rash    .Has patient had a PCN reaction causing immediate rash, facial/tongue/throat swelling, SOB or lightheadedness with hypotension: Unknown Has patient had a PCN reaction causing severe rash involving mucus membranes or skin necrosis: No Has patient had a PCN reaction that required hospitalization: No Has patient had a PCN reaction occurring within the last 10 years: No If all of the above answers are "NO", then may proceed with Cephalosporin use.    Prior to Admission medications   Medication Sig Start Date End Date Taking? Authorizing Provider  ALPRAZolam Duanne Moron) 0.5 MG tablet TAKE 1 TABLET BY MOUTH AT BEDTIME AS NEEDED FOR ANXIETY 01/05/17  Yes Mar Daring, PA-C  cyanocobalamin 1000 MCG tablet Take 1,000 mcg by mouth daily.   Yes [provider]  diltiazem (CARDIZEM) 120 MG tablet Take 120 mg by mouth 2 times daily at 12 noon and 4 pm.    Yes [provider]  flecainide (TAMBOCOR) 50 MG tablet Take 50 mg by mouth 2 (two) times daily.  11/16/13  Yes [provider]  gabapentin (NEURONTIN) 100 MG capsule TAKE 1 CAPSULE (100 MG TOTAL) BY MOUTH 3 (THREE) TIMES DAILY. 12/21/16  Yes Jerrol Banana., MD  metoprolol tartrate (LOPRESSOR) 100 MG  tablet Take 1 tablet (100 mg total) by mouth 2 (two) times daily. 08/31/16  Yes Theodoro Grist, MD  potassium chloride SA (K-DUR,KLOR-CON) 20 MEQ tablet Take 40 mEq by mouth 2 (two) times daily.   Yes [provider]  PROAIR HFA 108 (90 Base) MCG/ACT inhaler INHALE 2 INHALATIONS INTO THE LUNGS EVERY 6 (SIX) HOURS AS NEEDED FOR WHEEZING. 04/04/15  Yes [provider]  prochlorperazine (COMPAZINE) 10 MG tablet Take 1 tablet (10 mg total) by mouth every 8 (eight) hours as needed for nausea or vomiting. 09/05/16  Yes Birdie Sons, MD  Pyridoxine HCl (VITAMIN B6) 200 MG TABS Take 200 tablets by mouth 2 (two) times daily.  11/16/13  Yes [provider]  torsemide (DEMADEX) 20 MG tablet  Take 40 mg by mouth 2 (two) times daily.   Yes [provider]  warfarin (COUMADIN) 2 MG tablet Take 2 mg by mouth every other day. Take 2mg  one day and take 2.5mg  next day, alternating.   Yes [provider]  warfarin (COUMADIN) 2.5 MG tablet Take 2.5 mg by mouth every other day. Take 2mg  one day and take 2.5mg  next day, alternating. 11/16/13  Yes [provider]    Review of Systems  Constitutional: Positive for fatigue. Negative for appetite change.  HENT: Negative for congestion, postnasal drip and sore throat.   Eyes: Negative.   Respiratory: Positive for cough and shortness of breath. Negative for chest tightness.   Cardiovascular: Positive for leg swelling (right > left). Negative for chest pain and palpitations.  Gastrointestinal: Negative for abdominal distention, abdominal pain and diarrhea.  Endocrine: Negative.   Genitourinary: Negative.   Musculoskeletal: Negative for arthralgias, back pain and neck pain.  Skin: Negative.   Allergic/Immunologic: Negative.   Neurological: Negative for dizziness and light-headedness.  Hematological: Negative for adenopathy. Does not bruise/bleed easily.  Psychiatric/Behavioral: Negative for dysphoric mood and sleep disturbance (wearing oxygen on 3L). The patient is not nervous/anxious.    Vitals:   01/05/17 1054 01/05/17 1101 01/05/17 1102  BP: 111/78 112/68   Pulse: (!) 113  77  Resp: 15    SpO2: 91%    Weight: 173 lb (78.5 kg)    Height: 5\' 1"  (1.549 m)     Wt Readings from Last 3 Encounters:  01/05/17 173 lb (78.5 kg)  12/29/16 182 lb 6 oz (82.7 kg)  12/11/16 185 lb (83.9 kg)    Lab Results  Component Value Date   CREATININE 0.69 11/20/2016   CREATININE 0.83 09/02/2016   CREATININE 0.57 08/30/2016    Physical Exam  Constitutional: She is oriented to person, place, and time. She appears well-developed and well-nourished.  HENT:  Head: Normocephalic and atraumatic.  Neck: Normal range of motion.  Neck supple. No JVD present.  Cardiovascular: An irregular rhythm present. Tachycardia present.   Pulmonary/Chest: Effort normal. She has no wheezes. She has no rales.  Abdominal: Soft. She exhibits no distension. There is no tenderness.  Musculoskeletal: She exhibits edema (2+ pitting edema in right lower leg and 1+ pitting in left lower leg). She exhibits no tenderness.  Neurological: She is alert and oriented to person, place, and time.  Skin: Skin is warm and dry.  Psychiatric: She has a normal mood and affect. Her behavior is normal. Thought content normal.  Nursing note and vitals reviewed.  Assessment & Plan 1: Chronic heart failure with preserved ejection fraction- - NYHA class II - mildly fluid overloaded today but edema is down - weighing daily  and writing weight down. Continue to weigh daily and to call for an overnight weight gain > 2 pounds or a weekly weight gain > 5 pounds; says that she's lost 13 pounds on her home scale - weight down 9 pounds from the last time she was here last week - using salt when cooking foods. Advised to follow a 2000 mg sodium diet.  - took 2.5mg  metolazone daily for 2 days last week - has sleep study scheduled for 01/16/17  2: HTN- - BP looks good today - saw PCP Rosanna Randy) on 10/01/16 & returns January 2019   3: Atrial fibrillation- - HR 113 bpm (I) & then 77 (I) today - can tell when she's in afib as she feels palpitations - continue on metoprolol tartrate, diltiazem, and flecainide - saw cardiologist ([place]) 11/20/16 and returns 02/23/17 - taking alternating doses of warfarin 2 mg QOD along with 2.5mg  QOD - anticoagulated with warfarin; last INR 2.37 checked on 08/31/16 - will check INR today  4: Hypokalemia- - BMP from 11/20/16 reviewed and shows potassium level of 4.5 - BMP drawn today  5: Lymphedema- - stage 2 lymphedema - elevating her legs helps decrease edema some but then it returns  - has worn compression socks in the past but  inconsistently because she says that she has difficulty in putting them on - mom lost her leg due to poor circulation - her son Abe People has been contacted by [employer]  Patient did not bring her medications nor a list. Each medication was verbally reviewed with the patient and she was encouraged to bring the bottles to every visit to confirm accuracy of list.  Return in 6 weeks or sooner for any questions/problems before then.

## 2017-01-14 DIAGNOSIS — I5032 Chronic diastolic (congestive) heart failure: Secondary | ICD-10-CM | POA: Diagnosis not present

## 2017-01-16 ENCOUNTER — Ambulatory Visit: Payer: Medicare Other | Attending: Internal Medicine

## 2017-01-16 DIAGNOSIS — G4736 Sleep related hypoventilation in conditions classified elsewhere: Secondary | ICD-10-CM | POA: Diagnosis not present

## 2017-01-16 DIAGNOSIS — G4733 Obstructive sleep apnea (adult) (pediatric): Secondary | ICD-10-CM | POA: Insufficient documentation

## 2017-01-16 DIAGNOSIS — G4761 Periodic limb movement disorder: Secondary | ICD-10-CM | POA: Insufficient documentation

## 2017-01-20 ENCOUNTER — Ambulatory Visit: Payer: Self-pay | Admitting: Family

## 2017-01-20 ENCOUNTER — Ambulatory Visit: Payer: Medicare Other | Attending: Family | Admitting: Family

## 2017-01-20 VITALS — BP 112/77 | HR 90 | Resp 16 | Ht 61.0 in | Wt 180.2 lb

## 2017-01-20 DIAGNOSIS — Z833 Family history of diabetes mellitus: Secondary | ICD-10-CM | POA: Insufficient documentation

## 2017-01-20 DIAGNOSIS — Z7901 Long term (current) use of anticoagulants: Secondary | ICD-10-CM | POA: Diagnosis not present

## 2017-01-20 DIAGNOSIS — Z800 Family history of malignant neoplasm of digestive organs: Secondary | ICD-10-CM | POA: Diagnosis not present

## 2017-01-20 DIAGNOSIS — I4891 Unspecified atrial fibrillation: Secondary | ICD-10-CM | POA: Diagnosis not present

## 2017-01-20 DIAGNOSIS — Z888 Allergy status to other drugs, medicaments and biological substances status: Secondary | ICD-10-CM | POA: Diagnosis not present

## 2017-01-20 DIAGNOSIS — Z820 Family history of epilepsy and other diseases of the nervous system: Secondary | ICD-10-CM | POA: Insufficient documentation

## 2017-01-20 DIAGNOSIS — I5031 Acute diastolic (congestive) heart failure: Secondary | ICD-10-CM | POA: Diagnosis not present

## 2017-01-20 DIAGNOSIS — I110 Hypertensive heart disease with heart failure: Secondary | ICD-10-CM | POA: Diagnosis not present

## 2017-01-20 DIAGNOSIS — Z880 Allergy status to penicillin: Secondary | ICD-10-CM | POA: Diagnosis not present

## 2017-01-20 DIAGNOSIS — Z79899 Other long term (current) drug therapy: Secondary | ICD-10-CM | POA: Insufficient documentation

## 2017-01-20 DIAGNOSIS — R05 Cough: Secondary | ICD-10-CM | POA: Diagnosis not present

## 2017-01-20 DIAGNOSIS — Z801 Family history of malignant neoplasm of trachea, bronchus and lung: Secondary | ICD-10-CM | POA: Insufficient documentation

## 2017-01-20 DIAGNOSIS — I482 Chronic atrial fibrillation, unspecified: Secondary | ICD-10-CM

## 2017-01-20 DIAGNOSIS — Z8349 Family history of other endocrine, nutritional and metabolic diseases: Secondary | ICD-10-CM | POA: Diagnosis not present

## 2017-01-20 DIAGNOSIS — Z8249 Family history of ischemic heart disease and other diseases of the circulatory system: Secondary | ICD-10-CM | POA: Diagnosis not present

## 2017-01-20 DIAGNOSIS — G2581 Restless legs syndrome: Secondary | ICD-10-CM | POA: Insufficient documentation

## 2017-01-20 DIAGNOSIS — I890 Lymphedema, not elsewhere classified: Secondary | ICD-10-CM | POA: Insufficient documentation

## 2017-01-20 DIAGNOSIS — I10 Essential (primary) hypertension: Secondary | ICD-10-CM

## 2017-01-20 MED ORDER — METOLAZONE 2.5 MG PO TABS: 2.5000 mg | ORAL_TABLET | ORAL | 6 refills | Status: DC

## 2017-01-20 NOTE — Progress Notes (Signed)
Patient ID: Hannah Faulkner, female    DOB: 08/08/1944, 76 y.o.   MRN: [id number]  HPI  Ms. Heuer is a 76 yo female with a PMH of atrial fibrillation, HTN, restless leg syndrome, and CHF.   Last echo on 08/07/16 which showed an EF of 60-65%.  Last admission was 08/26/16 for a heart failure exacerbation. She was up 16 pounds in the hospital with edema, SOB, and orthopnea. She was given IV lasix and diuresed 3.3 L. Admitted on 08/04/16 for acute respiratory failure due to acute on chronic CHF. She received IV lasix and one dose of metolazone with a UOP 2000 cc in 24 hr. Her IV lasix was changed to PO lasix with KCL at discharge.   She presents for a follow-up visit with a chief complaint of edema in her legs. She describes this as chronic being present for several years with varying levels of severity. She does say that the edema has worsened over the last few days. She has associated fatigue, cough, shortness of breath and weight gain along with this. She denies any dizziness, difficulty sleeping or chest pain.  Past Medical History:  Diagnosis Date  . A-fib ([place])   . CHF (congestive heart failure) ([place])   . Hypertension    Past Surgical History:  Procedure Laterality Date  . EYE SURGERY    . POLYPECTOMY     colon poly premoved   Family History  Problem Relation Age of Onset  . Hypertension Mother   . Heart disease Mother   . CVA Mother   . Heart attack Mother   . Lung cancer Father   . Heart attack Sister   . CVA Sister   . Thyroid disease Sister   . Liver cancer Brother   . Thyroid disease Sister   . Thyroid disease Sister   . Diabetes Sister   . Dementia Sister   . Atrial fibrillation Sister    Social History  Substance Use Topics  . Smoking status: Never Smoker  . Smokeless tobacco: Never Used  . Alcohol use No   Allergies  Allergen Reactions  . Ampicillin   . Prinzide  [Lisinopril-Hydrochlorothiazide]   . Penicillins Rash    .Has patient had a PCN reaction causing  immediate rash, facial/tongue/throat swelling, SOB or lightheadedness with hypotension: Unknown Has patient had a PCN reaction causing severe rash involving mucus membranes or skin necrosis: No Has patient had a PCN reaction that required hospitalization: No Has patient had a PCN reaction occurring within the last 10 years: No If all of the above answers are "NO", then may proceed with Cephalosporin use.    Prior to Admission medications   Medication Sig Start Date End Date Taking? Authorizing Provider  ALPRAZolam Duanne Moron) 0.5 MG tablet TAKE 1 TABLET BY MOUTH AT BEDTIME AS NEEDED FOR ANXIETY 01/05/17  Yes Mar Daring, PA-C  cyanocobalamin 1000 MCG tablet Take 1,000 mcg by mouth daily.   Yes [provider]  diltiazem (CARDIZEM) 120 MG tablet Take 120 mg by mouth 2 times daily at 12 noon and 4 pm.    Yes [provider]  flecainide (TAMBOCOR) 50 MG tablet Take 50 mg by mouth 2 (two) times daily.  11/16/13  Yes [provider]  gabapentin (NEURONTIN) 100 MG capsule TAKE 1 CAPSULE (100 MG TOTAL) BY MOUTH 3 (THREE) TIMES DAILY. 12/21/16  Yes Jerrol Banana., MD  metoprolol tartrate (LOPRESSOR) 100 MG tablet Take 1 tablet (100 mg total) by mouth  2 (two) times daily. 08/31/16  Yes Theodoro Grist, MD  potassium chloride SA (K-DUR,KLOR-CON) 20 MEQ tablet Take 40 mEq by mouth 2 (two) times daily.   Yes [provider]  PROAIR HFA 108 (90 Base) MCG/ACT inhaler INHALE 2 INHALATIONS INTO THE LUNGS EVERY 6 (SIX) HOURS AS NEEDED FOR WHEEZING. 04/04/15  Yes [provider]  prochlorperazine (COMPAZINE) 10 MG tablet Take 1 tablet (10 mg total) by mouth every 8 (eight) hours as needed for nausea or vomiting. 09/05/16  Yes Birdie Sons, MD  Pyridoxine HCl (VITAMIN B6) 200 MG TABS Take 200 tablets by mouth 2 (two) times daily.  11/16/13  Yes [provider]  torsemide (DEMADEX) 20 MG tablet Take 40 mg by mouth 2 (two) times daily.   Yes [provider]  warfarin (COUMADIN) 2 MG tablet Take 2 mg by mouth every other day. Take 2mg  one day and take 2.5mg  next day, alternating.   Yes [provider]  warfarin (COUMADIN) 2.5 MG tablet Take 2.5 mg by mouth every other day. Take 2mg  one day and take 2.5mg  next day, alternating. 11/16/13  Yes [provider]   Review of Systems  Constitutional: Positive for fatigue. Negative for appetite change.  HENT: Negative for congestion, postnasal drip and sore throat.   Eyes: Negative.   Respiratory: Positive for cough and shortness of breath. Negative for chest tightness.   Cardiovascular: Positive for leg swelling (right > left). Negative for chest pain and palpitations.  Gastrointestinal: Positive for vomiting (after coughing). Negative for abdominal distention, abdominal pain and diarrhea.  Endocrine: Negative.   Genitourinary: Negative.   Musculoskeletal: Negative for arthralgias, back pain and neck pain.  Skin: Negative.   Allergic/Immunologic: Negative.   Neurological: Negative for dizziness and light-headedness.  Hematological: Negative for adenopathy. Does not bruise/bleed easily.  Psychiatric/Behavioral: Negative for dysphoric mood and sleep disturbance (wearing oxygen on 2L). The patient is not nervous/anxious.    Vitals:   01/20/17 1209  BP: 112/77  Pulse: 90  Resp: 16  SpO2: 90%  Weight: 180 lb 4 oz (81.8 kg)  Height: 5\' 1"  (1.549 m)   Wt Readings from Last 3 Encounters:  01/20/17 180 lb 4 oz (81.8 kg)  01/05/17 173 lb (78.5 kg)  12/29/16 182 lb 6 oz (82.7 kg)    Lab Results  Component Value Date   CREATININE 0.86 01/05/2017   CREATININE 0.69 11/20/2016   CREATININE 0.83 09/02/2016    Physical Exam  Constitutional: She is oriented to person, place, and time. She appears well-developed and well-nourished.  HENT:  Head: Normocephalic and atraumatic.  Neck: Normal range of motion. Neck supple. No JVD present.  Cardiovascular: An irregular  rhythm present. Tachycardia present.   Pulmonary/Chest: Effort normal. She has no wheezes. She has no rales.  Abdominal: Soft. She exhibits no distension. There is no tenderness.  Musculoskeletal: She exhibits edema (2+ pitting edema in right lower leg and 1+ pitting in left lower leg). She exhibits no tenderness.  Neurological: She is alert and oriented to person, place, and time.  Skin: Skin is warm and dry.  Psychiatric: She has a normal mood and affect. Her behavior is normal. Thought content normal.  Nursing note and vitals reviewed.  Assessment & Plan 1: Acute heart failure with preserved ejection fraction- - NYHA class III - moderately fluid overloaded today  - weighing daily and writing weight down. Continue to weigh daily and to call for an overnight weight gain > 2 pounds or a  weekly weight gain > 5 pounds;  - weight up 7 pounds from the last time she was here  - using salt when cooking foods. Advised to follow a 2000 mg sodium diet.  - will add metolazone 2.5mg  daily for the next 2 days and then use it weekly. When taking the metolazone, take an extra 53meq potassium - had sleep study on 01/16/17; waiting on results - patient reports receiving her flu vaccine for the season already - wearing oxygen at 2-3L around the clock  2: HTN- - BP looks good today - saw PCP Rosanna Randy) on 10/01/16 & returns January 2019   3: Atrial fibrillation- - continue on metoprolol tartrate, diltiazem, and flecainide - saw cardiologist ([place]) 11/20/16 and returns 02/23/17 - taking alternating doses of warfarin 2 mg QOD along with 2.5mg  QOD - anticoagulated with warfarin; last INR 3.05 checked on 01/05/17  4: Lymphedema- - stage 2 lymphedema - elevating her legs helps decrease edema some but then it returns  - has worn compression socks in the past but inconsistently because she says that she has difficulty in putting them on - mom lost her leg due to poor circulation - [employer] is  coming out today to fit patient for the compression boots  Patient did not bring her medications nor a list. Each medication was verbally reviewed with the patient and she was encouraged to bring the bottles to every visit to confirm accuracy of list.  Return in 1 month or sooner for any questions/problems before then or if her weight/edema does not improve. Will check a BMP at her next visit.

## 2017-01-20 NOTE — Patient Instructions (Addendum)
Continue weighing daily and call for an overnight weight gain of > 2 pounds or a weekly weight gain of >5 pounds.  Take metolazone 2.5mg  daily for the next 2 days. Best to take it 1/2 hour prior to torsemide dose. Then starting next week, you will take metolazone 2.5mg  every Wednesday.  When you take the metolazone, take an additional 41meq potassium.

## 2017-01-21 ENCOUNTER — Encounter: Payer: Self-pay | Admitting: Family

## 2017-01-23 DIAGNOSIS — G4733 Obstructive sleep apnea (adult) (pediatric): Secondary | ICD-10-CM | POA: Diagnosis not present

## 2017-01-30 ENCOUNTER — Telehealth: Payer: Self-pay | Admitting: *Deleted

## 2017-01-30 ENCOUNTER — Other Ambulatory Visit: Payer: Self-pay | Admitting: Internal Medicine

## 2017-01-30 DIAGNOSIS — G4733 Obstructive sleep apnea (adult) (pediatric): Secondary | ICD-10-CM

## 2017-01-30 NOTE — Telephone Encounter (Signed)
Pt aware of results of sleep study. Home sleep study ordered. Patient made aware that she will be contacted once home sleep study approved by ins. She had made appt but ask to cancel. Nothing further needed.

## 2017-02-11 ENCOUNTER — Encounter: Payer: Self-pay | Admitting: Family

## 2017-02-11 ENCOUNTER — Telehealth: Payer: Self-pay | Admitting: Family

## 2017-02-11 ENCOUNTER — Other Ambulatory Visit: Payer: Self-pay

## 2017-02-11 ENCOUNTER — Ambulatory Visit: Payer: Medicare Other | Attending: Family | Admitting: Family

## 2017-02-11 VITALS — BP 106/60 | Resp 20 | Ht 61.0 in | Wt 176.5 lb

## 2017-02-11 DIAGNOSIS — Z888 Allergy status to other drugs, medicaments and biological substances status: Secondary | ICD-10-CM | POA: Insufficient documentation

## 2017-02-11 DIAGNOSIS — Z880 Allergy status to penicillin: Secondary | ICD-10-CM | POA: Insufficient documentation

## 2017-02-11 DIAGNOSIS — Z7901 Long term (current) use of anticoagulants: Secondary | ICD-10-CM | POA: Insufficient documentation

## 2017-02-11 DIAGNOSIS — Z801 Family history of malignant neoplasm of trachea, bronchus and lung: Secondary | ICD-10-CM | POA: Insufficient documentation

## 2017-02-11 DIAGNOSIS — I890 Lymphedema, not elsewhere classified: Secondary | ICD-10-CM | POA: Diagnosis not present

## 2017-02-11 DIAGNOSIS — Z800 Family history of malignant neoplasm of digestive organs: Secondary | ICD-10-CM | POA: Diagnosis not present

## 2017-02-11 DIAGNOSIS — Z9889 Other specified postprocedural states: Secondary | ICD-10-CM | POA: Insufficient documentation

## 2017-02-11 DIAGNOSIS — I482 Chronic atrial fibrillation, unspecified: Secondary | ICD-10-CM

## 2017-02-11 DIAGNOSIS — I110 Hypertensive heart disease with heart failure: Secondary | ICD-10-CM | POA: Insufficient documentation

## 2017-02-11 DIAGNOSIS — I5032 Chronic diastolic (congestive) heart failure: Secondary | ICD-10-CM | POA: Diagnosis not present

## 2017-02-11 DIAGNOSIS — Z79899 Other long term (current) drug therapy: Secondary | ICD-10-CM | POA: Insufficient documentation

## 2017-02-11 DIAGNOSIS — I10 Essential (primary) hypertension: Secondary | ICD-10-CM

## 2017-02-11 DIAGNOSIS — R0602 Shortness of breath: Secondary | ICD-10-CM | POA: Insufficient documentation

## 2017-02-11 DIAGNOSIS — I4891 Unspecified atrial fibrillation: Secondary | ICD-10-CM | POA: Insufficient documentation

## 2017-02-11 DIAGNOSIS — Z823 Family history of stroke: Secondary | ICD-10-CM | POA: Diagnosis not present

## 2017-02-11 DIAGNOSIS — Z8249 Family history of ischemic heart disease and other diseases of the circulatory system: Secondary | ICD-10-CM | POA: Insufficient documentation

## 2017-02-11 LAB — BASIC METABOLIC PANEL
Anion gap: 12 (ref 5–15)
BUN: 12 mg/dL (ref 6–20)
CHLORIDE: 97 mmol/L — AB (ref 101–111)
CO2: 30 mmol/L (ref 22–32)
CREATININE: 0.78 mg/dL (ref 0.44–1.00)
Calcium: 8.8 mg/dL — ABNORMAL LOW (ref 8.9–10.3)
GFR calc Af Amer: >60 mL/min (ref >60)
GFR calc non Af Amer: >60 mL/min (ref >60)
GLUCOSE: 100 mg/dL — AB (ref 65–99)
Potassium: 3.3 mmol/L — ABNORMAL LOW (ref 3.5–5.1)
SODIUM: 139 mmol/L (ref 135–145)

## 2017-02-11 NOTE — Progress Notes (Signed)
Patient ID: Hannah Faulkner, female    DOB: 08/08/1944, 76 y.o.   MRN: [id number]  HPI  Hannah Faulkner is a 76 yo female with a PMH of atrial fibrillation, HTN, restless leg syndrome, and CHF.   Last echo on 08/07/16 which showed an EF of 60-65%.  Last admission was 08/26/16 for a heart failure exacerbation. She was up 16 pounds in the hospital with edema, SOB, and orthopnea. She was given IV lasix and diuresed 3.3 L.    She presents for a follow-up visit with a chief complaint of moderate fatigue with little exertion. She describes this as chronic in nature having been present for several years with varying levels of severity. She doesn't feel like it's any worse. She has associated cough, shortness of breath and edema. She denies any chest pain, palpitations, dizziness, difficulty sleeping or weight gain.   Past Medical History:  Diagnosis Date  . A-fib ([place])   . CHF (congestive heart failure) ([place])   . Hypertension    Past Surgical History:  Procedure Laterality Date  . EYE SURGERY    . POLYPECTOMY     colon poly premoved   Family History  Problem Relation Age of Onset  . Hypertension Mother   . Heart disease Mother   . CVA Mother   . Heart attack Mother   . Lung cancer Father   . Heart attack Sister   . CVA Sister   . Thyroid disease Sister   . Liver cancer Brother   . Thyroid disease Sister   . Thyroid disease Sister   . Diabetes Sister   . Dementia Sister   . Atrial fibrillation Sister    Social History   Tobacco Use  . Smoking status: Never Smoker  . Smokeless tobacco: Never Used  Substance Use Topics  . Alcohol use: No   Allergies  Allergen Reactions  . Ampicillin   . Prinzide  [Lisinopril-Hydrochlorothiazide]   . Penicillins Rash    .Has patient had a PCN reaction causing immediate rash, facial/tongue/throat swelling, SOB or lightheadedness with hypotension: Unknown Has patient had a PCN reaction causing severe rash involving mucus membranes or skin necrosis:  No Has patient had a PCN reaction that required hospitalization: No Has patient had a PCN reaction occurring within the last 10 years: No If all of the above answers are "NO", then may proceed with Cephalosporin use.    Prior to Admission medications   Medication Sig Start Date End Date Taking? Authorizing Provider  ALPRAZolam Duanne Moron) 0.5 MG tablet TAKE 1 TABLET BY MOUTH AT BEDTIME AS NEEDED FOR ANXIETY 01/05/17  Yes Mar Daring, PA-C  cyanocobalamin 1000 MCG tablet Take 1,000 mcg by mouth daily.   Yes [provider]  diltiazem (CARDIZEM) 120 MG tablet Take 120 mg by mouth 2 times daily at 12 noon and 4 pm.    Yes [provider]  flecainide (TAMBOCOR) 50 MG tablet Take 50 mg by mouth 2 (two) times daily.  11/16/13  Yes [provider]  gabapentin (NEURONTIN) 100 MG capsule TAKE 1 CAPSULE (100 MG TOTAL) BY MOUTH 3 (THREE) TIMES DAILY. 12/21/16  Yes Jerrol Banana., MD  metolazone (ZAROXOLYN) 2.5 MG tablet Take 1 tablet (2.5 mg total) by mouth once a week. 01/20/17 02/19/17 Yes Darylene Price A, FNP  metoprolol tartrate (LOPRESSOR) 100 MG tablet Take 1 tablet (100 mg total) by mouth 2 (two) times daily. 08/31/16  Yes Theodoro Grist, MD  potassium chloride SA (K-DUR,KLOR-CON) 20  MEQ tablet Take 40 mEq by mouth 2 (two) times daily.   Yes [provider]  PROAIR HFA 108 (90 Base) MCG/ACT inhaler INHALE 2 INHALATIONS INTO THE LUNGS EVERY 6 (SIX) HOURS AS NEEDED FOR WHEEZING. 04/04/15  Yes [provider]  prochlorperazine (COMPAZINE) 10 MG tablet Take 1 tablet (10 mg total) by mouth every 8 (eight) hours as needed for nausea or vomiting. 09/05/16  Yes Birdie Sons, MD  Pyridoxine HCl (VITAMIN B6) 200 MG TABS Take 200 tablets by mouth 2 (two) times daily.  11/16/13  Yes [provider]  torsemide (DEMADEX) 20 MG tablet Take 40 mg by mouth 2 (two) times daily.   Yes [provider]  warfarin (COUMADIN) 2 MG tablet Take 2 mg by  mouth every other day. Take 2mg  one day and take 2.5mg  next day, alternating.   Yes [provider]  warfarin (COUMADIN) 2.5 MG tablet Take 2.5 mg by mouth every other day. Take 2mg  one day and take 2.5mg  next day, alternating. 11/16/13  Yes [provider]    Review of Systems  Constitutional: Positive for fatigue. Negative for appetite change.  HENT: Negative for congestion, postnasal drip and sore throat.   Eyes: Negative.   Respiratory: Positive for cough (intermittent), chest tightness (at times) and shortness of breath.   Cardiovascular: Positive for leg swelling (right > left). Negative for chest pain and palpitations.  Gastrointestinal: Negative for abdominal distention, abdominal pain and diarrhea.  Endocrine: Negative.   Genitourinary: Negative.   Musculoskeletal: Negative for arthralgias, back pain and neck pain.  Skin: Negative.   Allergic/Immunologic: Negative.   Neurological: Negative for dizziness and light-headedness.  Hematological: Negative for adenopathy. Does not bruise/bleed easily.  Psychiatric/Behavioral: Negative for dysphoric mood and sleep disturbance (wearing oxygen on 2L). The patient is not nervous/anxious.    Vitals:   02/11/17 1041  BP: 106/60  Resp: 20  SpO2: 91%  Weight: 176 lb 8 oz (80.1 kg)  Height: 5\' 1"  (1.549 m)   Wt Readings from Last 3 Encounters:  02/11/17 176 lb 8 oz (80.1 kg)  01/20/17 180 lb 4 oz (81.8 kg)  01/05/17 173 lb (78.5 kg)    Lab Results  Component Value Date   CREATININE 0.86 01/05/2017   CREATININE 0.69 11/20/2016   CREATININE 0.83 09/02/2016    Physical Exam  Constitutional: She is oriented to person, place, and time. She appears well-developed and well-nourished.  HENT:  Head: Normocephalic and atraumatic.  Neck: Normal range of motion. Neck supple. No JVD present.  Cardiovascular: Normal rate. An irregular rhythm present.  Pulmonary/Chest: Effort normal. She has no wheezes. She has no rales.   Abdominal: Soft. She exhibits no distension. There is no tenderness.  Musculoskeletal: She exhibits edema (1+ pitting edema in bilateral lower legs with R>L). She exhibits no tenderness.  Neurological: She is alert and oriented to person, place, and time.  Skin: Skin is warm and dry.  Psychiatric: She has a normal mood and affect. Her behavior is normal. Thought content normal.  Nursing note and vitals reviewed.  Assessment & Plan 1: Chronic heart failure with preserved ejection fraction- - NYHA class III - mildly fluid overloaded today  - weighing daily and writing weight down. Continue to weigh daily and to call for an overnight weight gain > 2 pounds or a weekly weight gain > 5 pounds;  - weight down 4 pounds from the last time she was here  - using salt when cooking foods. Advised to  follow a 2000 mg sodium diet.  - now taking 2.5mg  metolazone weekly along with additional 6meq potassium - BMP drawn today - had sleep study on 01/16/17 but says they didn't get good results so will be having a home sleep study done 02/24/17 - patient reports receiving her flu vaccine for the season already - wearing oxygen at 2-3L around the clock  2: HTN- - BP looks good today - saw PCP Rosanna Randy) on 10/01/16 & returns January 2019 - BMP from 01/05/17 reviewed an shows sodium 140, potassium 3.8 and GFR >60   3: Atrial fibrillation- - continue on metoprolol tartrate, diltiazem, and flecainide - saw cardiologist ([place]) 11/20/16 and returns 02/23/17 - taking alternating doses of warfarin 2 mg QOD along with 2.5mg  QOD - anticoagulated with warfarin; last INR 3.05 checked on 01/05/17  4: Lymphedema- - stage 2 lymphedema - elevating her legs helps decrease edema some but then it returns  - has worn compression socks in the past but inconsistently because she says that she has difficulty in putting them on - mom lost her leg due to poor circulation - son has to get with [employer] to set up  a time to come to the home. Reminded patient to remind her son to call them  Patient did not bring her medications nor a list. Each medication was verbally reviewed with the patient and she was encouraged to bring the bottles to every visit to confirm accuracy of list.  Return in 3 months or sooner for any questions/problems before then.

## 2017-02-11 NOTE — Telephone Encounter (Signed)
Spoke with patient regarding lab results that were obtained today (02/11/17). Renal function looks good (creatinine 0.78/ GFR >60) but potassium is low at 3.3. She is taking 2.5mg  metolazone once a week with an additional 45meq potassium on that day. She normally takes 42meq potassium BID.   Advised patient to continue her 75meq potassium BID and on the day that she takes the metolazone, she is to take an additional 11meq potassium. Patient verbalizes understanding.

## 2017-02-11 NOTE — Patient Instructions (Signed)
Continue weighing daily and call for an overnight weight gain of > 2 pounds or a weekly weight gain of >5 pounds. 

## 2017-02-14 DIAGNOSIS — I5032 Chronic diastolic (congestive) heart failure: Secondary | ICD-10-CM | POA: Diagnosis not present

## 2017-02-23 DIAGNOSIS — I5032 Chronic diastolic (congestive) heart failure: Secondary | ICD-10-CM | POA: Diagnosis not present

## 2017-02-23 DIAGNOSIS — R609 Edema, unspecified: Secondary | ICD-10-CM | POA: Diagnosis not present

## 2017-02-23 DIAGNOSIS — I480 Paroxysmal atrial fibrillation: Secondary | ICD-10-CM | POA: Diagnosis not present

## 2017-02-23 DIAGNOSIS — Z9889 Other specified postprocedural states: Secondary | ICD-10-CM | POA: Diagnosis not present

## 2017-02-23 DIAGNOSIS — E7800 Pure hypercholesterolemia, unspecified: Secondary | ICD-10-CM | POA: Diagnosis not present

## 2017-02-24 ENCOUNTER — Ambulatory Visit: Payer: Medicare Other | Admitting: Internal Medicine

## 2017-03-04 ENCOUNTER — Other Ambulatory Visit: Payer: Self-pay | Admitting: Physician Assistant

## 2017-03-09 ENCOUNTER — Other Ambulatory Visit: Payer: Self-pay | Admitting: Family

## 2017-03-09 MED ORDER — POTASSIUM CHLORIDE CRYS ER 20 MEQ PO TBCR: 40.0000 meq | EXTENDED_RELEASE_TABLET | Freq: Two times a day (BID) | ORAL | 5 refills | Status: DC

## 2017-03-16 DIAGNOSIS — I5032 Chronic diastolic (congestive) heart failure: Secondary | ICD-10-CM | POA: Diagnosis not present

## 2017-03-20 ENCOUNTER — Other Ambulatory Visit: Payer: Self-pay

## 2017-03-20 MED ORDER — TORSEMIDE 20 MG PO TABS: 40.0000 mg | ORAL_TABLET | Freq: Two times a day (BID) | ORAL | 3 refills | Status: DC

## 2017-04-06 ENCOUNTER — Encounter: Payer: Self-pay | Admitting: Family Medicine

## 2017-04-06 ENCOUNTER — Ambulatory Visit (INDEPENDENT_AMBULATORY_CARE_PROVIDER_SITE_OTHER): Payer: Medicare Other | Admitting: Family Medicine

## 2017-04-06 VITALS — BP 132/86 | HR 128 | Temp 98.0°F | Resp 16 | Wt 172.0 lb

## 2017-04-06 DIAGNOSIS — H9222 Otorrhagia, left ear: Secondary | ICD-10-CM | POA: Diagnosis not present

## 2017-04-06 NOTE — Patient Instructions (Signed)
We will call you with the ENT referral. Place a small piece of cotton in your ear to soak up any further bleeding

## 2017-04-06 NOTE — Progress Notes (Signed)
Subjective:     Patient ID: Hannah Faulkner, female   DOB: 08/08/1944, 77 y.o.   MRN: [id number] Chief Complaint  Patient presents with  . Ear Drainage    Patient states this morning she woke up and noticed that her pillow case was bloody, she states that she had dried up blood all around her ear. Patient denies sticking anything in ear or feeling of fullness or drainage previously.    HPI States she felt pressure in her ear- "like your are up in the mountains"-but unclear if this was before or after the bleeding. Denies ear pain or changes in her hearing. Admits to using a Q-tip the night before. Accompanied by her son.  Review of Systems     Objective:   Physical Exam  Constitutional: She appears well-developed and well-nourished. No distress.  HENT:  Right ear canal partially obscured by cerumen Left ear: no tragal tenderness or external injury noted. Ear canal appears to have blood throughout obscuring TM.       Assessment:    1. Ear bleeding, left: concerning for TM  Rupture. - Ambulatory referral to ENT    Plan:   Further f/u pending ENT referral. Use cotton pledget when in bed to soak up any further bleeding.

## 2017-04-07 ENCOUNTER — Telehealth: Payer: Self-pay | Admitting: Family Medicine

## 2017-04-07 NOTE — Telephone Encounter (Signed)
Christy from [facility] is wanting to know if you are planning on putting pt on an antibiotic ?Marland KitchenCall back # is [phone]

## 2017-04-07 NOTE — Telephone Encounter (Signed)
FYI--Pt is scheduled to see Dr Tami Ribas 04/08/17 at 2:00

## 2017-04-07 NOTE — Telephone Encounter (Signed)
Called back and informed them II did not place on abx due to lack of firm dx and possible complications due to Coumadin use.

## 2017-04-07 NOTE — Telephone Encounter (Signed)
Patient was seen in office yesterday 1/14 please review chart. KW

## 2017-04-07 NOTE — Telephone Encounter (Signed)
See below. KW 

## 2017-04-08 DIAGNOSIS — H60339 Swimmer's ear, unspecified ear: Secondary | ICD-10-CM | POA: Diagnosis not present

## 2017-04-15 ENCOUNTER — Encounter: Payer: Self-pay | Admitting: Family Medicine

## 2017-04-15 ENCOUNTER — Ambulatory Visit (INDEPENDENT_AMBULATORY_CARE_PROVIDER_SITE_OTHER): Payer: Medicare Other | Admitting: Family Medicine

## 2017-04-15 VITALS — BP 122/84 | HR 118 | Temp 98.0°F | Resp 14 | Wt 171.0 lb

## 2017-04-15 DIAGNOSIS — E1142 Type 2 diabetes mellitus with diabetic polyneuropathy: Secondary | ICD-10-CM

## 2017-04-15 DIAGNOSIS — I10 Essential (primary) hypertension: Secondary | ICD-10-CM

## 2017-04-15 DIAGNOSIS — I482 Chronic atrial fibrillation, unspecified: Secondary | ICD-10-CM

## 2017-04-15 DIAGNOSIS — K219 Gastro-esophageal reflux disease without esophagitis: Secondary | ICD-10-CM

## 2017-04-15 MED ORDER — OMEPRAZOLE 20 MG PO CPDR: 20.0000 mg | DELAYED_RELEASE_CAPSULE | Freq: Every day | ORAL | 11 refills | Status: AC

## 2017-04-15 NOTE — Progress Notes (Signed)
Patient: Hannah Faulkner Female    DOB: 08/08/1944   77 y.o.   MRN: [id number] Visit Date: 04/15/2017  Today's Provider: Wilhemena Durie, MD   Chief Complaint  Patient presents with  . Diabetes   Subjective:    HPI  Diabetes Mellitus Type II, Follow-up:   Lab Results  Component Value Date   HGBA1C 6.5 12/11/2016   HGBA1C 6.2 (H) 08/31/2016   HGBA1C 5.8 07/28/2016    Last seen for diabetes 3 months ago.  Management since then includes none. She reports good compliance with treatment. She is not having side effects.  Home blood sugar records: none   Pertinent Labs:    Component Value Date/Time   CHOL 148 11/27/2015 0953   TRIG 109 11/27/2015 0953   HDL 42 11/27/2015 0953   LDLCALC 84 11/27/2015 0953   CREATININE 0.78 02/11/2017 1121    Wt Readings from Last 3 Encounters:  04/15/17 171 lb (77.6 kg)  04/06/17 172 lb (78 kg)  02/11/17 176 lb 8 oz (80.1 kg)    ------------------------------------------------------------------------ Pt reports that she has been feeling ok. She has been having the pain in her left breast at night when she lays down. She gets up and take a Tums and it goes away. Pt wants to discuss driving again.       Allergies  Allergen Reactions  . Ampicillin   . Prinzide  [Lisinopril-Hydrochlorothiazide]   . Penicillins Rash    .Has patient had a PCN reaction causing immediate rash, facial/tongue/throat swelling, SOB or lightheadedness with hypotension: Unknown Has patient had a PCN reaction causing severe rash involving mucus membranes or skin necrosis: No Has patient had a PCN reaction that required hospitalization: No Has patient had a PCN reaction occurring within the last 10 years: No If all of the above answers are "NO", then may proceed with Cephalosporin use.      Current Outpatient Medications:  .  ALPRAZolam (XANAX) 0.5 MG tablet, TAKE 1 TABLET BY MOUTH AT BEDTIME AS NEEDED, Disp: 30 tablet, Rfl: 1 .  cyanocobalamin  1000 MCG tablet, Take 1,000 mcg by mouth daily., Disp: , Rfl:  .  diltiazem (CARDIZEM) 120 MG tablet, Take 120 mg by mouth 2 times daily at 12 noon and 4 pm. , Disp: , Rfl:  .  flecainide (TAMBOCOR) 50 MG tablet, Take 50 mg by mouth 2 (two) times daily. , Disp: , Rfl:  .  gabapentin (NEURONTIN) 100 MG capsule, TAKE 1 CAPSULE (100 MG TOTAL) BY MOUTH 3 (THREE) TIMES DAILY., Disp: 270 capsule, Rfl: 2 .  metoprolol tartrate (LOPRESSOR) 100 MG tablet, Take 1 tablet (100 mg total) by mouth 2 (two) times daily., Disp: 60 tablet, Rfl: 3 .  potassium chloride SA (K-DUR,KLOR-CON) 20 MEQ tablet, Take 2 tablets (40 mEq total) by mouth 2 (two) times daily. And an additional 4meq ever Wednesday with metolazone, Disp: 124 tablet, Rfl: 5 .  PROAIR HFA 108 (90 Base) MCG/ACT inhaler, INHALE 2 INHALATIONS INTO THE LUNGS EVERY 6 (SIX) HOURS AS NEEDED FOR WHEEZING., Disp: , Rfl: 4 .  Pyridoxine HCl (VITAMIN B6) 200 MG TABS, Take 200 tablets by mouth 2 (two) times daily. , Disp: , Rfl:  .  torsemide (DEMADEX) 20 MG tablet, Take 2 tablets (40 mg total) by mouth 2 (two) times daily., Disp: 60 tablet, Rfl: 3 .  warfarin (COUMADIN) 2 MG tablet, Take 2 mg by mouth every other day. Take 2mg  one day and take 2.5mg  next  day, alternating., Disp: , Rfl:  .  warfarin (COUMADIN) 2.5 MG tablet, Take 2.5 mg by mouth every other day. Take 2mg  one day and take 2.5mg  next day, alternating., Disp: , Rfl:  .  metolazone (ZAROXOLYN) 2.5 MG tablet, Take 1 tablet (2.5 mg total) by mouth once a week., Disp: 4 tablet, Rfl: 6 .  prochlorperazine (COMPAZINE) 10 MG tablet, Take 1 tablet (10 mg total) by mouth every 8 (eight) hours as needed for nausea or vomiting. (Patient not taking: Reported on 04/06/2017), Disp: 30 tablet, Rfl: 1  Review of Systems  Constitutional: Negative.   HENT: Negative.   Eyes: Negative.   Respiratory: Negative.   Cardiovascular: Negative.   Gastrointestinal: Negative.   Endocrine: Negative.   Genitourinary:  Negative.   Musculoskeletal: Negative.   Skin: Negative.   Allergic/Immunologic: Negative.   Neurological: Negative.   Hematological: Negative.   Psychiatric/Behavioral: Negative.     Social History   Tobacco Use  . Smoking status: Never Smoker  . Smokeless tobacco: Never Used  Substance Use Topics  . Alcohol use: No   Objective:   BP 122/84 (BP Location: Left Arm, Patient Position: Sitting, Cuff Size: Normal)   Pulse (!) 118   Temp 98 F (36.7 C) (Oral)   Resp 14   Wt 171 lb (77.6 kg)   SpO2 94%   BMI 32.31 kg/m  Vitals:   04/15/17 1514  BP: 122/84  Pulse: (!) 118  Resp: 14  Temp: 98 F (36.7 C)  TempSrc: Oral  SpO2: 94%  Weight: 171 lb (77.6 kg)     Physical Exam  Constitutional: She is oriented to person, place, and time. She appears well-developed and well-nourished.  HENT:  Head: Normocephalic and atraumatic.  Right Ear: External ear normal.  Left Ear: External ear normal.  Nose: Nose normal.  Mouth/Throat: Oropharynx is clear and moist.  Eyes: Conjunctivae are normal. No scleral icterus.  Neck: No thyromegaly present.  Cardiovascular: Normal rate, regular rhythm and normal heart sounds.  Pulmonary/Chest: Effort normal and breath sounds normal.  Abdominal: Soft.  Neurological: She is alert and oriented to person, place, and time.  Skin: Skin is warm and dry.  Psychiatric: She has a normal mood and affect. Her behavior is normal. Judgment and thought content normal.        Assessment & Plan:     1. Type 2 diabetes mellitus with diabetic polyneuropathy, without long-term current use of insulin (HCC) RTC 4 months - POCT HgB A1C--6.1 today.  2. Essential hypertension   3. Atrial fibrillation, chronic ([place])   4. Gastroesophageal reflux disease, esophagitis presence not specified  - omeprazole (PRILOSEC) 20 MG capsule; Take 1 capsule (20 mg total) by mouth at bedtime.  Dispense: 30 capsule; Refill: 11  I have done the exam and reviewed the  chart and it is accurate to the best of my knowledge. Development worker, community has been used and  any errors in dictation or transcription are unintentional. Miguel Aschoff M.D. [facility]

## 2017-04-16 DIAGNOSIS — I5032 Chronic diastolic (congestive) heart failure: Secondary | ICD-10-CM | POA: Diagnosis not present

## 2017-04-19 ENCOUNTER — Encounter: Payer: Self-pay | Admitting: Internal Medicine

## 2017-04-19 DIAGNOSIS — G4733 Obstructive sleep apnea (adult) (pediatric): Secondary | ICD-10-CM

## 2017-04-21 LAB — POCT GLYCOSYLATED HEMOGLOBIN (HGB A1C): HEMOGLOBIN A1C: 6.1

## 2017-04-24 DIAGNOSIS — G4733 Obstructive sleep apnea (adult) (pediatric): Secondary | ICD-10-CM | POA: Diagnosis not present

## 2017-04-27 ENCOUNTER — Telehealth: Payer: Self-pay | Admitting: *Deleted

## 2017-04-27 DIAGNOSIS — G4733 Obstructive sleep apnea (adult) (pediatric): Secondary | ICD-10-CM

## 2017-04-27 NOTE — Telephone Encounter (Signed)
Pt aware of results.  Orders placed. 

## 2017-04-27 NOTE — Telephone Encounter (Signed)
Dr. Juanell Fairly will order ONO after cpap initiation Pt is currently on 2L qhs. o2 not ordered with cpap. Nothing further needed.

## 2017-05-05 ENCOUNTER — Other Ambulatory Visit: Payer: Self-pay | Admitting: Family Medicine

## 2017-05-05 NOTE — Telephone Encounter (Signed)
Patient is requesting a refill on the following medication  ALPRAZolam (XANAX) 0.5 MG tablet  She uses [facility]

## 2017-05-06 ENCOUNTER — Other Ambulatory Visit: Payer: Self-pay | Admitting: Family Medicine

## 2017-05-06 MED ORDER — ALPRAZOLAM 0.5 MG PO TABS: 0.5000 mg | ORAL_TABLET | Freq: Every evening | ORAL | 2 refills | Status: DC | PRN

## 2017-05-06 NOTE — Telephone Encounter (Signed)
Please advise. Thanks.  

## 2017-05-11 ENCOUNTER — Other Ambulatory Visit: Payer: Self-pay | Admitting: Family Medicine

## 2017-05-13 ENCOUNTER — Ambulatory Visit: Payer: Medicare Other | Admitting: Family

## 2017-05-14 NOTE — Progress Notes (Signed)
Patient ID: Hannah Faulkner, female    DOB: 27-Mar-1940, 77 y.o.   MRN: [id number]  HPI  Hannah Faulkner is a 77 yo female with a PMH of atrial fibrillation, HTN, restless leg syndrome, and CHF.   Last echo on 08/07/16 which showed an EF of 60-65%.  Has not been admitted or been in the ED in the last 6 months.     She presents for a follow-up visit with a chief complaint of moderate fatigue upon minimal exertion. She describes this as chronic in nature having been present for several years. She has associated intermittent chest tightness (relieved with antacids), cough, shortness of breath and edema along with this. She denies any wheezing, palpitations, abdominal distention, dizziness, difficulty sleeping or weight gain. Has noticed an increase in spider veins in her lower legs/feet.   Past Medical History:  Diagnosis Date  . A-fib ([place])   . CHF (congestive heart failure) ([place])   . Hypertension    Past Surgical History:  Procedure Laterality Date  . EYE SURGERY    . POLYPECTOMY     colon poly premoved   Family History  Problem Relation Age of Onset  . Hypertension Mother   . Heart disease Mother   . CVA Mother   . Heart attack Mother   . Lung cancer Father   . Heart attack Sister   . CVA Sister   . Thyroid disease Sister   . Liver cancer Brother   . Thyroid disease Sister   . Thyroid disease Sister   . Diabetes Sister   . Dementia Sister   . Atrial fibrillation Sister    Social History   Tobacco Use  . Smoking status: Never Smoker  . Smokeless tobacco: Never Used  Substance Use Topics  . Alcohol use: No   Allergies  Allergen Reactions  . Ampicillin   . Prinzide  [Lisinopril-Hydrochlorothiazide]   . Penicillins Rash    .Has patient had a PCN reaction causing immediate rash, facial/tongue/throat swelling, SOB or lightheadedness with hypotension: Unknown Has patient had a PCN reaction causing severe rash involving mucus membranes or skin necrosis: No Has patient had a  PCN reaction that required hospitalization: No Has patient had a PCN reaction occurring within the last 10 years: No If all of the above answers are "NO", then may proceed with Cephalosporin use.    Prior to Admission medications   Medication Sig Start Date End Date Taking? Authorizing Provider  ALPRAZolam Duanne Moron) 0.5 MG tablet TAKE 1 TABLET BY MOUTH AT BEDTIME AS NEEDED 05/11/17  Yes Jerrol Banana., MD  cyanocobalamin 1000 MCG tablet Take 1,000 mcg by mouth daily.   Yes [provider]  diltiazem (CARDIZEM) 120 MG tablet Take 120 mg by mouth 2 times daily at 12 noon and 4 pm.    Yes [provider]  flecainide (TAMBOCOR) 50 MG tablet Take 50 mg by mouth 2 (two) times daily.  11/16/13  Yes [provider]  gabapentin (NEURONTIN) 100 MG capsule TAKE 1 CAPSULE (100 MG TOTAL) BY MOUTH 3 (THREE) TIMES DAILY. 12/21/16  Yes Jerrol Banana., MD  metolazone (ZAROXOLYN) 2.5 MG tablet Take 1 tablet (2.5 mg total) by mouth once a week. 05/15/17 06/14/17 Yes Darylene Price A, FNP  metoprolol tartrate (LOPRESSOR) 100 MG tablet Take 1 tablet (100 mg total) by mouth 2 (two) times daily. 08/31/16  Yes Theodoro Grist, MD  omeprazole (PRILOSEC) 20 MG capsule Take 1 capsule (20 mg total) by mouth  at bedtime. 04/15/17  Yes Jerrol Banana., MD  potassium chloride SA (K-DUR,KLOR-CON) 20 MEQ tablet Take 2 tablets (40 mEq total) by mouth 2 (two) times daily. And an additional 57meq ever Wednesday with metolazone 03/09/17  Yes Tomma Ehinger, Aura Fey, FNP  PROAIR HFA 108 (90 Base) MCG/ACT inhaler INHALE 2 INHALATIONS INTO THE LUNGS EVERY 6 (SIX) HOURS AS NEEDED FOR WHEEZING. 04/04/15  Yes [provider]  prochlorperazine (COMPAZINE) 10 MG tablet Take 1 tablet (10 mg total) by mouth every 8 (eight) hours as needed for nausea or vomiting. 09/05/16  Yes Birdie Sons, MD  Pyridoxine HCl (VITAMIN B6) 200 MG TABS Take 200 tablets by mouth 2 (two) times daily.  11/16/13  Yes [provider]  torsemide (DEMADEX) 20 MG tablet Take 2 tablets (40 mg total) by mouth 2 (two) times daily. 05/15/17  Yes Darylene Price A, FNP  warfarin (COUMADIN) 2 MG tablet Take 2 mg by mouth every other day. Take 2mg  one day and take 2.5mg  next day, alternating.   Yes [provider]  warfarin (COUMADIN) 2.5 MG tablet Take 2.5 mg by mouth every other day. Take 2mg  one day and take 2.5mg  next day, alternating. 11/16/13  Yes [provider]   Review of Systems  Constitutional: Positive for fatigue. Negative for appetite change.  HENT: Negative for congestion, postnasal drip and sore throat.   Eyes: Negative.   Respiratory: Positive for cough (intermittent), chest tightness (at times) and shortness of breath.   Cardiovascular: Positive for leg swelling (right > left). Negative for chest pain and palpitations.  Gastrointestinal: Negative for abdominal distention, abdominal pain and diarrhea.  Endocrine: Negative.   Genitourinary: Negative.   Musculoskeletal: Negative for arthralgias, back pain and neck pain.  Skin: Negative.   Allergic/Immunologic: Negative.   Neurological: Negative for dizziness and light-headedness.  Hematological: Negative for adenopathy. Does not bruise/bleed easily.  Psychiatric/Behavioral: Negative for dysphoric mood and sleep disturbance (wearing oxygen on 2L). The patient is not nervous/anxious.    Vitals:   05/15/17 1207  BP: 123/82  Pulse: 96  Resp: 18  SpO2: 97%  Weight: 165 lb 4 oz (75 kg)  Height: 5\' 1"  (1.549 m)   Wt Readings from Last 3 Encounters:  05/15/17 165 lb 4 oz (75 kg)  04/15/17 171 lb (77.6 kg)  04/06/17 172 lb (78 kg)   Lab Results  Component Value Date   CREATININE 0.78 02/11/2017   CREATININE 0.86 01/05/2017   CREATININE 0.69 11/20/2016    Physical Exam  Constitutional: She is oriented to person, place, and time. She appears well-developed and well-nourished.  HENT:  Head: Normocephalic and atraumatic.  Neck:  Normal range of motion. Neck supple. No JVD present.  Cardiovascular: Normal rate. An irregular rhythm present.  Pulmonary/Chest: Effort normal. She has no wheezes. She has no rales.  Abdominal: Soft. She exhibits no distension. There is no tenderness.  Musculoskeletal: She exhibits edema (1+ pitting edema in bilateral lower legs with R>L). She exhibits no tenderness.  Neurological: She is alert and oriented to person, place, and time.  Skin: Skin is warm and dry.  Multiple spider veins noted in bilateral lower legs/feet with more on the right  Psychiatric: She has a normal mood and affect. Her behavior is normal. Thought content normal.  Nursing note and vitals reviewed.  Assessment & Plan 1: Chronic heart failure with preserved ejection fraction- - NYHA class III - mildly fluid overloaded today with edema although stable - weighing daily and  writing weight down. Continue to weigh daily and to call for an overnight weight gain > 2 pounds or a weekly weight gain > 5 pounds;  - weight down 6 pounds from the last time she was here  - using salt when cooking foods. Advised to follow a 2000 mg sodium diet.  - taking 2.5mg  metolazone weekly along with additional 14meq potassium - saw cardiology Margarito Courser) 02/23/17 - had sleep study on 01/16/17 but says they didn't get good results and had the home sleep study done; she has to follow up with pulmonologist who ordered the test - patient reports receiving her flu vaccine for the season already - wearing oxygen at 2-3L around the clock  2: HTN- - BP looks good today - saw PCP Rosanna Randy) 04/15/17 - BMP from 02/11/17 reviewed an shows sodium 139, potassium 3.3 and GFR >60   3: Atrial fibrillation- - continue on metoprolol tartrate, diltiazem, and flecainide - taking alternating doses of warfarin 2 mg QOD along with 2.5mg  QOD - anticoagulated with warfarin; INR 4.0 on 02/23/17  4: Lymphedema- - stage 2 lymphedema - she admits that she's not  elevating her legs like she should and she was encouraged to elevate them when sitting for long periods of time - has worn compression socks in the past but inconsistently because she says that she has difficulty in putting them on - mom lost her leg due to poor circulation - patient had to do an appeal for the lymphapress compression boots and is waiting on that outcome - patient to call vascular provider to get an appointment scheduled to have her varicosity evaluated - patient was asking if her neurontin could be contributing to edema. Advised her that it was possible and if she wanted to try decreasing dosage to 1 twice daily and see if that helps and doesn't make leg pain re-occur, she could.   Patient did not bring her medications nor a list. Each medication was verbally reviewed with the patient and she was encouraged to bring the bottles to every visit to confirm accuracy of list.  Return in 3 months or sooner for any questions/problems before then.

## 2017-05-15 ENCOUNTER — Ambulatory Visit: Payer: Medicare Other | Attending: Family | Admitting: Family

## 2017-05-15 ENCOUNTER — Encounter: Payer: Self-pay | Admitting: Family

## 2017-05-15 VITALS — BP 123/82 | HR 96 | Resp 18 | Ht 61.0 in | Wt 165.2 lb

## 2017-05-15 DIAGNOSIS — I890 Lymphedema, not elsewhere classified: Secondary | ICD-10-CM | POA: Diagnosis not present

## 2017-05-15 DIAGNOSIS — Z8249 Family history of ischemic heart disease and other diseases of the circulatory system: Secondary | ICD-10-CM | POA: Insufficient documentation

## 2017-05-15 DIAGNOSIS — I4891 Unspecified atrial fibrillation: Secondary | ICD-10-CM | POA: Insufficient documentation

## 2017-05-15 DIAGNOSIS — Z79899 Other long term (current) drug therapy: Secondary | ICD-10-CM | POA: Diagnosis not present

## 2017-05-15 DIAGNOSIS — I5032 Chronic diastolic (congestive) heart failure: Secondary | ICD-10-CM

## 2017-05-15 DIAGNOSIS — Z809 Family history of malignant neoplasm, unspecified: Secondary | ICD-10-CM | POA: Insufficient documentation

## 2017-05-15 DIAGNOSIS — I509 Heart failure, unspecified: Secondary | ICD-10-CM | POA: Diagnosis not present

## 2017-05-15 DIAGNOSIS — Z833 Family history of diabetes mellitus: Secondary | ICD-10-CM | POA: Diagnosis not present

## 2017-05-15 DIAGNOSIS — Z7901 Long term (current) use of anticoagulants: Secondary | ICD-10-CM | POA: Diagnosis not present

## 2017-05-15 DIAGNOSIS — I110 Hypertensive heart disease with heart failure: Secondary | ICD-10-CM | POA: Insufficient documentation

## 2017-05-15 DIAGNOSIS — I10 Essential (primary) hypertension: Secondary | ICD-10-CM

## 2017-05-15 DIAGNOSIS — Z880 Allergy status to penicillin: Secondary | ICD-10-CM | POA: Insufficient documentation

## 2017-05-15 DIAGNOSIS — I482 Chronic atrial fibrillation, unspecified: Secondary | ICD-10-CM

## 2017-05-15 MED ORDER — METOLAZONE 2.5 MG PO TABS: 2.5000 mg | ORAL_TABLET | ORAL | 6 refills | Status: AC

## 2017-05-15 MED ORDER — TORSEMIDE 20 MG PO TABS: 40.0000 mg | ORAL_TABLET | Freq: Two times a day (BID) | ORAL | 5 refills | Status: DC

## 2017-05-15 NOTE — Patient Instructions (Addendum)
Continue weighing daily and call for an overnight weight gain of > 2 pounds or a weekly weight gain of >5 pounds.  Please contact [facility] to set up an appointment. [phone]

## 2017-05-17 DIAGNOSIS — I5032 Chronic diastolic (congestive) heart failure: Secondary | ICD-10-CM | POA: Diagnosis not present

## 2017-05-21 ENCOUNTER — Inpatient Hospital Stay
Admission: EM | Admit: 2017-05-21 | Discharge: 2017-05-23 | DRG: 193 | Disposition: A | Discharge status: Home Health | Payer: Medicare Other | Attending: Internal Medicine | Admitting: Internal Medicine

## 2017-05-21 ENCOUNTER — Ambulatory Visit (INDEPENDENT_AMBULATORY_CARE_PROVIDER_SITE_OTHER): Payer: Medicare Other | Admitting: Physician Assistant

## 2017-05-21 ENCOUNTER — Encounter: Payer: Self-pay | Admitting: Emergency Medicine

## 2017-05-21 ENCOUNTER — Other Ambulatory Visit: Payer: Self-pay

## 2017-05-21 ENCOUNTER — Emergency Department: Payer: Medicare Other

## 2017-05-21 DIAGNOSIS — G4730 Sleep apnea, unspecified: Secondary | ICD-10-CM | POA: Diagnosis present

## 2017-05-21 DIAGNOSIS — Z7901 Long term (current) use of anticoagulants: Secondary | ICD-10-CM

## 2017-05-21 DIAGNOSIS — J9621 Acute and chronic respiratory failure with hypoxia: Secondary | ICD-10-CM | POA: Diagnosis present

## 2017-05-21 DIAGNOSIS — I4891 Unspecified atrial fibrillation: Secondary | ICD-10-CM | POA: Diagnosis present

## 2017-05-21 DIAGNOSIS — J189 Pneumonia, unspecified organism: Secondary | ICD-10-CM | POA: Diagnosis not present

## 2017-05-21 DIAGNOSIS — R0602 Shortness of breath: Secondary | ICD-10-CM | POA: Diagnosis not present

## 2017-05-21 DIAGNOSIS — I5032 Chronic diastolic (congestive) heart failure: Secondary | ICD-10-CM

## 2017-05-21 DIAGNOSIS — I10 Essential (primary) hypertension: Secondary | ICD-10-CM

## 2017-05-21 DIAGNOSIS — Z881 Allergy status to other antibiotic agents status: Secondary | ICD-10-CM | POA: Diagnosis not present

## 2017-05-21 DIAGNOSIS — I5031 Acute diastolic (congestive) heart failure: Secondary | ICD-10-CM

## 2017-05-21 DIAGNOSIS — I482 Chronic atrial fibrillation, unspecified: Secondary | ICD-10-CM

## 2017-05-21 DIAGNOSIS — J181 Lobar pneumonia, unspecified organism: Principal | ICD-10-CM | POA: Diagnosis present

## 2017-05-21 DIAGNOSIS — Z79899 Other long term (current) drug therapy: Secondary | ICD-10-CM

## 2017-05-21 DIAGNOSIS — I110 Hypertensive heart disease with heart failure: Secondary | ICD-10-CM | POA: Diagnosis present

## 2017-05-21 DIAGNOSIS — Z888 Allergy status to other drugs, medicaments and biological substances status: Secondary | ICD-10-CM

## 2017-05-21 DIAGNOSIS — A419 Sepsis, unspecified organism: Secondary | ICD-10-CM | POA: Diagnosis not present

## 2017-05-21 DIAGNOSIS — R918 Other nonspecific abnormal finding of lung field: Secondary | ICD-10-CM | POA: Diagnosis not present

## 2017-05-21 DIAGNOSIS — I480 Paroxysmal atrial fibrillation: Secondary | ICD-10-CM | POA: Diagnosis not present

## 2017-05-21 DIAGNOSIS — R0902 Hypoxemia: Secondary | ICD-10-CM

## 2017-05-21 DIAGNOSIS — Z880 Allergy status to penicillin: Secondary | ICD-10-CM | POA: Diagnosis not present

## 2017-05-21 DIAGNOSIS — Z9981 Dependence on supplemental oxygen: Secondary | ICD-10-CM | POA: Diagnosis not present

## 2017-05-21 DIAGNOSIS — N179 Acute kidney failure, unspecified: Secondary | ICD-10-CM | POA: Diagnosis not present

## 2017-05-21 DIAGNOSIS — J90 Pleural effusion, not elsewhere classified: Secondary | ICD-10-CM | POA: Diagnosis not present

## 2017-05-21 DIAGNOSIS — K219 Gastro-esophageal reflux disease without esophagitis: Secondary | ICD-10-CM | POA: Diagnosis not present

## 2017-05-21 HISTORY — DX: Sleep apnea, unspecified: G47.30

## 2017-05-21 LAB — URINALYSIS, COMPLETE (UACMP) WITH MICROSCOPIC
BILIRUBIN URINE: NEGATIVE
Glucose, UA: NEGATIVE mg/dL
HGB URINE DIPSTICK: NEGATIVE
KETONES UR: NEGATIVE mg/dL
NITRITE: NEGATIVE
Protein, ur: NEGATIVE mg/dL
SPECIFIC GRAVITY, URINE: 1.013 (ref 1.005–1.030)
pH: 7 (ref 5.0–8.0)

## 2017-05-21 LAB — CBC
HCT: 36.1 % (ref 35.0–47.0)
Hemoglobin: 12 g/dL (ref 12.0–16.0)
MCH: 28.3 pg (ref 26.0–34.0)
MCHC: 33.3 g/dL (ref 32.0–36.0)
MCV: 85.1 fL (ref 80.0–100.0)
PLATELETS: 174 10*3/uL (ref 150–440)
RBC: 4.25 MIL/uL (ref 3.80–5.20)
RDW: 15.5 % — AB (ref 11.5–14.5)
WBC: 6.8 10*3/uL (ref 3.6–11.0)

## 2017-05-21 LAB — PROTIME-INR
INR: 3.17
Prothrombin Time: 32.3 seconds — ABNORMAL HIGH (ref 11.4–15.2)

## 2017-05-21 LAB — BASIC METABOLIC PANEL
Anion gap: 11 (ref 5–15)
BUN: 10 mg/dL (ref 6–20)
CALCIUM: 9 mg/dL (ref 8.9–10.3)
CO2: 32 mmol/L (ref 22–32)
CREATININE: 0.88 mg/dL (ref 0.44–1.00)
Chloride: 97 mmol/L — ABNORMAL LOW (ref 101–111)
GFR calc Af Amer: >60 mL/min (ref >60)
Glucose, Bld: 106 mg/dL — ABNORMAL HIGH (ref 65–99)
Potassium: 3.3 mmol/L — ABNORMAL LOW (ref 3.5–5.1)
SODIUM: 140 mmol/L (ref 135–145)

## 2017-05-21 LAB — INFLUENZA PANEL BY PCR (TYPE A & B)
INFLAPCR: NEGATIVE
INFLBPCR: NEGATIVE

## 2017-05-21 LAB — LACTIC ACID, PLASMA
LACTIC ACID, VENOUS: 1.4 mmol/L (ref 0.5–1.9)
Lactic Acid, Venous: 1.9 mmol/L (ref 0.5–1.9)

## 2017-05-21 LAB — TROPONIN I: Troponin I: <0.03 ng/mL (ref <0.03)

## 2017-05-21 LAB — PROCALCITONIN: Procalcitonin: <0.1 ng/mL

## 2017-05-21 MED ORDER — LEVOFLOXACIN IN D5W 500 MG/100ML IV SOLN
500.0000 mg | INTRAVENOUS | Status: DC
  Filled 2017-05-21: qty 100

## 2017-05-21 MED ORDER — PANTOPRAZOLE SODIUM 40 MG PO TBEC
40.0000 mg | DELAYED_RELEASE_TABLET | Freq: Every day | ORAL | Status: DC
  Administered 2017-05-21 – 2017-05-23 (×3): 40 mg via ORAL
  Filled 2017-05-21 (×3): qty 1

## 2017-05-21 MED ORDER — AMIODARONE HCL IN DEXTROSE 360-4.14 MG/200ML-% IV SOLN
60.0000 mg/h | INTRAVENOUS | Status: DC
  Administered 2017-05-21: 60 mg/h via INTRAVENOUS
  Filled 2017-05-21: qty 200

## 2017-05-21 MED ORDER — DEXTROSE 5 % IV SOLN
5.0000 mg/h | INTRAVENOUS | Status: DC
  Administered 2017-05-21: 15 mg/h via INTRAVENOUS
  Administered 2017-05-21: 5 mg/h via INTRAVENOUS
  Filled 2017-05-21: qty 100

## 2017-05-21 MED ORDER — VANCOMYCIN HCL IN DEXTROSE 1-5 GM/200ML-% IV SOLN: 1000.0000 mg | Freq: Once | INTRAVENOUS | Status: DC

## 2017-05-21 MED ORDER — MAGNESIUM OXIDE 400 (241.3 MG) MG PO TABS
400.0000 mg | ORAL_TABLET | Freq: Every day | ORAL | Status: DC
  Administered 2017-05-22 – 2017-05-23 (×2): 400 mg via ORAL
  Filled 2017-05-21 (×2): qty 1

## 2017-05-21 MED ORDER — SODIUM CHLORIDE 0.9% FLUSH
3.0000 mL | Freq: Two times a day (BID) | INTRAVENOUS | Status: DC
  Administered 2017-05-21 – 2017-05-23 (×4): 3 mL via INTRAVENOUS

## 2017-05-21 MED ORDER — GUAIFENESIN-DM 100-10 MG/5ML PO SYRP
15.0000 mL | ORAL_SOLUTION | ORAL | Status: DC | PRN
  Administered 2017-05-21: 15 mL via ORAL
  Filled 2017-05-21: qty 15

## 2017-05-21 MED ORDER — VITAMIN B6 200 MG PO TABS: 200.0000 | ORAL_TABLET | Freq: Two times a day (BID) | ORAL | Status: DC

## 2017-05-21 MED ORDER — METOPROLOL TARTRATE 50 MG PO TABS
100.0000 mg | ORAL_TABLET | Freq: Two times a day (BID) | ORAL | Status: DC
  Administered 2017-05-21 – 2017-05-22 (×3): 100 mg via ORAL
  Administered 2017-05-23: 50 mg via ORAL
  Filled 2017-05-21 (×4): qty 2

## 2017-05-21 MED ORDER — AMIODARONE HCL IN DEXTROSE 360-4.14 MG/200ML-% IV SOLN
30.0000 mg/h | INTRAVENOUS | Status: DC
  Administered 2017-05-22 (×2): 30 mg/h via INTRAVENOUS
  Filled 2017-05-21 (×2): qty 200

## 2017-05-21 MED ORDER — LEVOFLOXACIN IN D5W 750 MG/150ML IV SOLN
750.0000 mg | Freq: Once | INTRAVENOUS | Status: AC
  Administered 2017-05-21: 750 mg via INTRAVENOUS
  Filled 2017-05-21: qty 150

## 2017-05-21 MED ORDER — GUAIFENESIN ER 600 MG PO TB12
600.0000 mg | ORAL_TABLET | Freq: Two times a day (BID) | ORAL | Status: DC
  Administered 2017-05-21 – 2017-05-23 (×4): 600 mg via ORAL
  Filled 2017-05-21 (×4): qty 1

## 2017-05-21 MED ORDER — GABAPENTIN 100 MG PO CAPS
100.0000 mg | ORAL_CAPSULE | Freq: Three times a day (TID) | ORAL | Status: DC
  Administered 2017-05-21 – 2017-05-23 (×6): 100 mg via ORAL
  Filled 2017-05-21 (×6): qty 1

## 2017-05-21 MED ORDER — SODIUM CHLORIDE 0.9% FLUSH: 3.0000 mL | INTRAVENOUS | Status: DC | PRN

## 2017-05-21 MED ORDER — MAGNESIUM OXIDE 400 MG PO TABS: 400.0000 mg | ORAL_TABLET | Freq: Every day | ORAL | Status: DC

## 2017-05-21 MED ORDER — ONDANSETRON HCL 4 MG/2ML IJ SOLN
4.0000 mg | Freq: Four times a day (QID) | INTRAMUSCULAR | Status: DC | PRN
Start: 2017-05-21 — End: 2017-05-23
  Administered 2017-05-21: 4 mg via INTRAVENOUS
  Filled 2017-05-21: qty 2

## 2017-05-21 MED ORDER — FLECAINIDE ACETATE 50 MG PO TABS
50.0000 mg | ORAL_TABLET | Freq: Two times a day (BID) | ORAL | Status: DC
  Administered 2017-05-21 – 2017-05-23 (×4): 50 mg via ORAL
  Filled 2017-05-21 (×5): qty 1

## 2017-05-21 MED ORDER — ACETAMINOPHEN 650 MG RE SUPP: 650.0000 mg | Freq: Four times a day (QID) | RECTAL | Status: DC | PRN

## 2017-05-21 MED ORDER — AMIODARONE LOAD VIA INFUSION
150.0000 mg | Freq: Once | INTRAVENOUS | Status: AC
  Administered 2017-05-21: 150 mg via INTRAVENOUS
  Filled 2017-05-21: qty 83.34

## 2017-05-21 MED ORDER — VITAMIN B-12 1000 MCG PO TABS
1000.0000 ug | ORAL_TABLET | Freq: Every day | ORAL | Status: DC
  Administered 2017-05-22 – 2017-05-23 (×2): 1000 ug via ORAL
  Filled 2017-05-21 (×2): qty 1

## 2017-05-21 MED ORDER — METOLAZONE 2.5 MG PO TABS
2.5000 mg | ORAL_TABLET | ORAL | Status: DC
  Filled 2017-05-21: qty 1

## 2017-05-21 MED ORDER — ONDANSETRON HCL 4 MG PO TABS
4.0000 mg | ORAL_TABLET | Freq: Four times a day (QID) | ORAL | Status: DC | PRN
Start: 2017-05-21 — End: 2017-05-23
  Administered 2017-05-23: 4 mg via ORAL

## 2017-05-21 MED ORDER — POTASSIUM CHLORIDE CRYS ER 20 MEQ PO TBCR
40.0000 meq | EXTENDED_RELEASE_TABLET | Freq: Two times a day (BID) | ORAL | Status: DC
  Administered 2017-05-21 – 2017-05-22 (×2): 40 meq via ORAL
  Filled 2017-05-21 (×2): qty 2

## 2017-05-21 MED ORDER — SODIUM CHLORIDE 0.9 % IV SOLN: 250.0000 mL | INTRAVENOUS | Status: DC | PRN

## 2017-05-21 MED ORDER — LEVOFLOXACIN IN D5W 500 MG/100ML IV SOLN
500.0000 mg | INTRAVENOUS | Status: DC
  Administered 2017-05-22: 500 mg via INTRAVENOUS
  Filled 2017-05-21 (×2): qty 100

## 2017-05-21 MED ORDER — SODIUM CHLORIDE 0.9 % IV BOLUS (SEPSIS)
500.0000 mL | Freq: Once | INTRAVENOUS | Status: AC
  Administered 2017-05-21: 500 mL via INTRAVENOUS

## 2017-05-21 MED ORDER — DILTIAZEM HCL 25 MG/5ML IV SOLN
10.0000 mg | Freq: Once | INTRAVENOUS | Status: AC
  Administered 2017-05-21: 10 mg via INTRAVENOUS
  Filled 2017-05-21: qty 5

## 2017-05-21 MED ORDER — PROCHLORPERAZINE MALEATE 10 MG PO TABS
10.0000 mg | ORAL_TABLET | Freq: Three times a day (TID) | ORAL | Status: DC | PRN
  Administered 2017-05-22: 10 mg via ORAL
  Filled 2017-05-21 (×2): qty 1

## 2017-05-21 MED ORDER — TORSEMIDE 20 MG PO TABS
40.0000 mg | ORAL_TABLET | Freq: Two times a day (BID) | ORAL | Status: DC
  Administered 2017-05-21 – 2017-05-22 (×3): 40 mg via ORAL
  Filled 2017-05-21 (×4): qty 2

## 2017-05-21 MED ORDER — DILTIAZEM HCL 25 MG/5ML IV SOLN
10.0000 mg | Freq: Once | INTRAVENOUS | Status: AC
  Administered 2017-05-21: 10 mg via INTRAVENOUS

## 2017-05-21 MED ORDER — ALPRAZOLAM 0.5 MG PO TABS
0.5000 mg | ORAL_TABLET | Freq: Every evening | ORAL | Status: DC | PRN
  Administered 2017-05-21 – 2017-05-22 (×2): 0.5 mg via ORAL
  Filled 2017-05-21 (×2): qty 1

## 2017-05-21 MED ORDER — VITAMIN B-12 1000 MCG PO TABS: 1000.0000 ug | ORAL_TABLET | Freq: Every day | ORAL | Status: DC

## 2017-05-21 MED ORDER — VITAMIN B-6 100 MG PO TABS
200.0000 mg | ORAL_TABLET | Freq: Every day | ORAL | Status: DC
  Administered 2017-05-22 – 2017-05-23 (×2): 200 mg via ORAL
  Filled 2017-05-21 (×2): qty 2

## 2017-05-21 MED ORDER — METOLAZONE 2.5 MG PO TABS: 2.5000 mg | ORAL_TABLET | ORAL | Status: DC

## 2017-05-21 MED ORDER — DIGOXIN 250 MCG PO TABS
0.2500 mg | ORAL_TABLET | Freq: Once | ORAL | Status: AC
  Administered 2017-05-21: 0.25 mg via ORAL
  Filled 2017-05-21: qty 1

## 2017-05-21 MED ORDER — ACETAMINOPHEN 325 MG PO TABS: 650.0000 mg | ORAL_TABLET | Freq: Four times a day (QID) | ORAL | Status: DC | PRN

## 2017-05-21 NOTE — ED Notes (Signed)
First RN note:  Patient sent by MD due to increased cough and congestion.  Patient has a-fib and is on chronic coumadin.  Patient wears 2L O2 chronically and is saturating on it at 93%.  Patient also had a heart rate of 148 in the office. Patient in NAD at this time at the first RN desk.

## 2017-05-21 NOTE — ED Provider Notes (Signed)
[facility] Emergency Department Provider Note ____________________________________________   I have reviewed the triage vital signs and the triage nursing note.  HISTORY  Chief Complaint Nasal Congestion and Tachycardia   Historian Patient  HPI Hannah Faulkner is a 77 y.o. female with a history of A. fib and CHF, presents from primary care doctor's office where she went for an appointment today because she has been having nasal congestion and low-grade fever since yesterday.  She is found to be in rapid A. fib and was sent to the ED for further evaluation.  She does feel short of breath.  She has had some coughing which is nonproductive.  Symptoms are moderate.  States that she had a flu swab done as an outpatient today which was negative.  Mild nausea without vomiting.  She is on flecainide.  She is on warfarin.  She is on metoprolol 50 mg daily as well as diltiazem although she states that she has not been on diltiazem for several weeks because she took herself off of it thinking that it was giving her indigestion.   Past Medical History:  Diagnosis Date  . A-fib ([place])   . CHF (congestive heart failure) ([place])   . Hypertension     Patient Active Problem List   Diagnosis Date Noted  . Hypokalemia 11/20/2016  . Lymphedema of both lower extremities 11/20/2016  . Peripheral edema 11/20/2016  . Chronic diastolic heart failure ([place]) 09/04/2016  . Restless legs 09/04/2016  . Fatigue 09/04/2016  . Sterile pyuria 08/31/2016  . Hyperglycemia 08/31/2016  . Acute diastolic heart failure ([place]) 08/26/2016  . Community acquired pneumonia 08/04/2016  . Tricuspid regurgitation 05/15/2015  . Mitral valve regurgitation 05/15/2015  . Anxiety 08/25/2014  . Atrial fibrillation, chronic ([place]) 08/25/2014  . Benign neoplasm of colon 08/25/2014  . 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 Diabetic neuropathy ([place]) 08/25/2014  . Essential hypertension 08/25/2014  .  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 Adiposity 08/25/2014  . Obstructive apnea 08/25/2014  . Hypercholesterolemia without hypertriglyceridemia 08/25/2014  . B12 deficiency 08/25/2014  . H/O cardiac catheterization 07/15/2013  . Apnea, sleep 07/15/2013  . AF (paroxysmal atrial fibrillation) ([place]) 07/15/2013    Past Surgical History:  Procedure Laterality Date  . EYE SURGERY    . POLYPECTOMY     colon poly premoved    Prior to Admission medications   Medication Sig Start Date End Date Taking? Authorizing Provider  ALPRAZolam Duanne Moron) 0.5 MG tablet TAKE 1 TABLET BY MOUTH AT BEDTIME AS NEEDED 05/11/17   Jerrol Banana., MD  cyanocobalamin 1000 MCG tablet Take 1,000 mcg by mouth daily.    [provider]  diltiazem (CARDIZEM) 120 MG tablet Take 120 mg by mouth 2 times daily at 12 noon and 4 pm.     [provider]  flecainide (TAMBOCOR) 50 MG tablet Take 50 mg by mouth 2 (two) times daily.  11/16/13   [provider]  gabapentin (NEURONTIN) 100 MG capsule TAKE 1 CAPSULE (100 MG TOTAL) BY MOUTH 3 (THREE) TIMES DAILY. 12/21/16   Jerrol Banana., MD  metolazone (ZAROXOLYN) 2.5 MG tablet Take 1 tablet (2.5 mg total) by mouth once a week. 05/15/17 06/14/17  Alisa Graff, FNP  metoprolol tartrate (LOPRESSOR) 100 MG tablet Take 1 tablet (100 mg total) by mouth 2 (two) times daily. 08/31/16   Theodoro Grist, MD  omeprazole (PRILOSEC) 20 MG capsule Take 1  capsule (20 mg total) by mouth at bedtime. 04/15/17   Jerrol Banana., MD  potassium chloride SA (K-DUR,KLOR-CON) 20 MEQ tablet Take 2 tablets (40 mEq total) by mouth 2 (two) times daily. And an additional 58meq ever Wednesday with metolazone 03/09/17   Darylene Price A, FNP  PROAIR HFA 108 (90 Base) MCG/ACT inhaler INHALE 2 INHALATIONS INTO THE LUNGS EVERY 6 (SIX) HOURS AS NEEDED FOR WHEEZING. 04/04/15   [provider]   prochlorperazine (COMPAZINE) 10 MG tablet Take 1 tablet (10 mg total) by mouth every 8 (eight) hours as needed for nausea or vomiting. 09/05/16   Birdie Sons, MD  Pyridoxine HCl (VITAMIN B6) 200 MG TABS Take 200 tablets by mouth 2 (two) times daily.  11/16/13   [provider]  torsemide (DEMADEX) 20 MG tablet Take 2 tablets (40 mg total) by mouth 2 (two) times daily. 05/15/17   Alisa Graff, FNP  warfarin (COUMADIN) 2 MG tablet Take 2 mg by mouth every other day. Take 2mg  one day and take 2.5mg  next day, alternating.    [provider]  warfarin (COUMADIN) 2.5 MG tablet Take 2.5 mg by mouth every other day. Take 2mg  one day and take 2.5mg  next day, alternating. 11/16/13   [provider]    Allergies  Allergen Reactions  . Ampicillin   . Prinzide  [Lisinopril-Hydrochlorothiazide]   . Penicillins Rash    .Has patient had a PCN reaction causing immediate rash, facial/tongue/throat swelling, SOB or lightheadedness with hypotension: Unknown Has patient had a PCN reaction causing severe rash involving mucus membranes or skin necrosis: No Has patient had a PCN reaction that required hospitalization: No Has patient had a PCN reaction occurring within the last 10 years: No If all of the above answers are "NO", then may proceed with Cephalosporin use.     Family History  Problem Relation Age of Onset  . Hypertension Mother   . Heart disease Mother   . CVA Mother   . Heart attack Mother   . Lung cancer Father   . Heart attack Sister   . CVA Sister   . Thyroid disease Sister   . Liver cancer Brother   . Thyroid disease Sister   . Thyroid disease Sister   . Diabetes Sister   . Dementia Sister   . Atrial fibrillation Sister     Social History Social History   Tobacco Use  . Smoking status: Never Smoker  . Smokeless tobacco: Never Used  Substance Use Topics  . Alcohol use: No  . Drug use: No    Review of Systems  Constitutional: Positive for  fever. Eyes: Negative for visual changes. ENT: Negative for sore throat. Cardiovascular: Negative for chest pain.  Positive for palpitations/heart racing. Respiratory: Positive for shortness of breath. Gastrointestinal: Negative for abdominal pain, vomiting and diarrhea. Genitourinary: Negative for dysuria. Musculoskeletal: Negative for back pain. Skin: Negative for rash. Neurological: Negative for headache.  ____________________________________________   PHYSICAL EXAM:  VITAL SIGNS: ED Triage Vitals [05/21/17 1313]  Enc Vitals Group     BP 139/82     Pulse Rate (!) 155     Resp 16     Temp 100.2 F (37.9 C)     Temp Source Oral     SpO2 95 %     Weight      Height      Head Circumference      Peak Flow      Pain Score 0  Pain Loc      Pain Edu?      Excl. in [place]?      Constitutional: Alert and oriented. Well appearing and in no distress. HEENT   Head: Normocephalic and atraumatic.      Eyes: Conjunctivae are normal. Pupils equal and round.       Ears:         Nose: No congestion/rhinnorhea.   Mouth/Throat: Mucous membranes are moist.   Neck: No stridor. Cardiovascular/Chest: Tachycardic and irregularly irregular.  No murmurs, rubs, or gallops. Respiratory: Normal respiratory effort without tachypnea nor retractions. Breath sounds are clear and equal bilaterally. No wheezes/rales/rhonchi. Gastrointestinal: Soft. No distention, no guarding, no rebound. Nontender.    Genitourinary/rectal:Deferred Musculoskeletal: Nontender with normal range of motion in all extremities. No joint effusions.  No lower extremity tenderness.  3+ pitting edema bilateral lower extremities Neurologic:  Normal speech and language. No gross or focal neurologic deficits are appreciated. Skin:  Skin is warm, dry and intact. No rash noted. Psychiatric: Mood and affect are normal. Speech and behavior are normal. Patient exhibits appropriate insight and  judgment.   ____________________________________________  LABS (pertinent positives/negatives) I, Lisa Roca, MD the attending physician have reviewed the labs noted below.  Labs Reviewed  BASIC METABOLIC PANEL - Abnormal; Notable for the following components:      Result Value   Potassium 3.3 (*)    Chloride 97 (*)    Glucose, Bld 106 (*)    All other components within normal limits  CBC - Abnormal; Notable for the following components:   RDW 15.5 (*)    All other components within normal limits  PROTIME-INR - Abnormal; Notable for the following components:   Prothrombin Time 32.3 (*)    All other components within normal limits  CULTURE, BLOOD (ROUTINE X 2)  CULTURE, BLOOD (ROUTINE X 2)  TROPONIN I  INFLUENZA PANEL BY PCR (TYPE A & B)  LACTIC ACID, PLASMA  LACTIC ACID, PLASMA    ____________________________________________    EKG I, Lisa Roca, MD, the attending physician have personally viewed and interpreted all ECGs.  159 bpm.  Atrial fibrillation with rapid ventricular response.  Left axis deviation.  Nonspecific ST and T wave ____________________________________________  RADIOLOGY   Chest x-ray reviewed by me:   Cardiomegaly, edema vs infiltrates Interpreted by radiologist:IMPRESSION: 1. Small right-sided pleural effusion with atelectasis or pneumonia at the right middle lobe and right base. Patchy atelectasis or minimal infiltrate at the left base as well. 2. Cardiomegaly with mild central vascular congestion. __________________________________________  PROCEDURES  Procedure(s) performed: None  Critical Care performed: CRITICAL CARE Performed by: Lisa Roca   Total critical care time: 30 minutes  Critical care time was exclusive of separately billable procedures and treating other patients.  Critical care was necessary to treat or prevent imminent or life-threatening deterioration.  Critical care was time spent personally by me on the  following activities: development of treatment plan with patient and/or surrogate as well as nursing, discussions with consultants, evaluation of patient's response to treatment, examination of patient, obtaining history from patient or surrogate, ordering and performing treatments and interventions, ordering and review of laboratory studies, ordering and review of radiographic studies, pulse oximetry and re-evaluation of patient's condition.    ____________________________________________  ED COURSE / ASSESSMENT AND PLAN  Pertinent labs & imaging results that were available during my care of the patient were reviewed by me and considered in my medical decision making (see chart for details).    Patient  arrived in rapid A. fib.  She has a history of chronic A. fib, states herself that she is been off diltiazem.  She is also on metoprolol as well as flecainide.  She is been having upper respiratory symptoms including fever and reports outpatient negative office test for the flu, however, concerned about the accuracy of that test and I am going to send the PCR here.  Laboratory studies are overall reassuring with no elevated white blood cell count or new significant anemia.  No renal failure.  Chest ray x-ray looks consistent with CHF more so than infiltrate, to my view, however radiologist concern for possible bilateral pneumonia.  Given fever and infiltrates, and tachycardia, will place on the sepsis pathway.  I did add blood cultures as well as lactate and antibiotics for penicillin allergic following sepsis pathway.  Patient did require second diltiazem bolus dosing.  And I will place on a drip.  No hypotension.  From a clinical standpoint, I am a little more concerned that she is still not low on fluids, however given not clearly CHF exacerbation, will try 500 cc of fluid given that pneumonia is a consideration at this point time.    DIFFERENTIAL DIAGNOSIS: Including but not limited to  dehydration, CHF, influenza, viral illness, etc.  CONSULTATIONS:   Hospitalist for admission, discussed with Dr. Posey Pronto.   Patient / Family / Caregiver informed of clinical course, medical decision-making process, and agree with plan.   ___________________________________________   FINAL CLINICAL IMPRESSION(S) / ED DIAGNOSES   Final diagnoses:  Atrial fibrillation with rapid ventricular response (HCC)  Sepsis, due to unspecified organism ([place])  Pneumonia of both lungs due to infectious organism, unspecified part of lung      ___________________________________________        Note: This dictation was prepared with Dragon dictation. Any transcriptional errors that result from this process are unintentional    Lisa Roca, MD 05/21/17 1421

## 2017-05-21 NOTE — H&P (Signed)
[facility] NAME: Hannah Faulkner    MR#:  [id number]  DATE OF BIRTH:  1940/06/12  DATE OF ADMISSION:  05/21/2017  PRIMARY CARE PHYSICIAN: Jerrol Banana., MD   REQUESTING/REFERRING PHYSICIAN: Lisa Roca MD  CHIEF COMPLAINT:   Chief Complaint  Patient presents with  . Nasal Congestion  . Tachycardia    HISTORY OF PRESENT ILLNESS: Hannah Faulkner  is a 77 y.o. female with a known history of atrial fibrillation,diastolic CHF essential hypertension chronic respiratory failure and history of sleep apnea who wears oxygen at all times presenting to the ER with complaint of tachycardia.  Patient states that she went to see her doctor today because she was having shortness of breath with cough congestion.  They noticed that her heart rate was barely elevated send her to the ER.  In the emergency room she is noted to have A. fib with RVR.  Chest x-ray suggestive of pneumonia.  Also complains of some fevers last night and some chills.  Patient states that she had the flu test checked in her primary care provider's office and was negative.      PAST MEDICAL HISTORY:   Past Medical History:  Diagnosis Date  . A-fib ([place])   . CHF (congestive heart failure) ([place])   . Hypertension   . Sleep apnea     PAST SURGICAL HISTORY:  Past Surgical History:  Procedure Laterality Date  . EYE SURGERY    . POLYPECTOMY     colon poly premoved    SOCIAL HISTORY:  Social History   Tobacco Use  . Smoking status: Never Smoker  . Smokeless tobacco: Never Used  Substance Use Topics  . Alcohol use: No    FAMILY HISTORY:  Family History  Problem Relation Age of Onset  . Hypertension Mother   . Heart disease Mother   . CVA Mother   . Heart attack Mother   . Lung cancer Father   . Heart attack Sister   . CVA Sister   . Thyroid disease Sister   . Liver cancer Brother   . Thyroid disease Sister   . Thyroid disease Sister   . Diabetes Sister   .  Dementia Sister   . Atrial fibrillation Sister     DRUG ALLERGIES:  Allergies  Allergen Reactions  . Ampicillin   . Prinzide  [Lisinopril-Hydrochlorothiazide]   . Penicillins Rash    .Has patient had a PCN reaction causing immediate rash, facial/tongue/throat swelling, SOB or lightheadedness with hypotension: Unknown Has patient had a PCN reaction causing severe rash involving mucus membranes or skin necrosis: No Has patient had a PCN reaction that required hospitalization: No Has patient had a PCN reaction occurring within the last 10 years: No If all of the above answers are "NO", then may proceed with Cephalosporin use.     REVIEW OF SYSTEMS:   CONSTITUTIONAL: Positive fever, positive fatigue or positive weakness.  EYES: No blurred or double vision.  EARS, NOSE, AND THROAT: No tinnitus or ear pain.  RESPIRATORY: Positive cough, positive shortness of breath, wheezing or hemoptysis.  CARDIOVASCULAR: No chest pain, orthopnea, edema.  GASTROINTESTINAL: No nausea, vomiting, diarrhea or abdominal pain.  GENITOURINARY: No dysuria, hematuria.  ENDOCRINE: No polyuria, nocturia,  HEMATOLOGY: No anemia, easy bruising or bleeding SKIN: No rash or lesion. MUSCULOSKELETAL: No joint pain or arthritis.   NEUROLOGIC: No tingling, numbness, weakness.  PSYCHIATRY: No anxiety or depression.   MEDICATIONS AT HOME:  Prior to Admission medications   Medication Sig Start Date End Date Taking? Authorizing Provider  albuterol (PROVENTIL HFA;VENTOLIN HFA) 108 (90 Base) MCG/ACT inhaler Inhale 2 puffs into the lungs every 6 (six) hours as needed for wheezing or shortness of breath.   Yes [provider]  ALPRAZolam Duanne Moron) 0.5 MG tablet TAKE 1 TABLET BY MOUTH AT BEDTIME AS NEEDED 05/11/17  Yes Jerrol Banana., MD  cyanocobalamin 1000 MCG tablet Take 1,000 mcg by mouth daily.   Yes [provider]  diltiazem (CARDIZEM) 120 MG tablet Take 120 mg by mouth 2 times daily at 12 noon  and 4 pm.    Yes [provider]  flecainide (TAMBOCOR) 50 MG tablet Take 50 mg by mouth 2 (two) times daily.  11/16/13  Yes [provider]  gabapentin (NEURONTIN) 100 MG capsule TAKE 1 CAPSULE (100 MG TOTAL) BY MOUTH 3 (THREE) TIMES DAILY. 12/21/16  Yes Jerrol Banana., MD  magnesium oxide (MAG-OX) 400 MG tablet Take 400 mg by mouth daily.   Yes [provider]  metolazone (ZAROXOLYN) 2.5 MG tablet Take 1 tablet (2.5 mg total) by mouth once a week. 05/15/17 06/14/17 Yes Darylene Price A, FNP  metoprolol tartrate (LOPRESSOR) 100 MG tablet Take 1 tablet (100 mg total) by mouth 2 (two) times daily. 08/31/16  Yes Theodoro Grist, MD  omeprazole (PRILOSEC) 20 MG capsule Take 1 capsule (20 mg total) by mouth at bedtime. 04/15/17  Yes Jerrol Banana., MD  potassium chloride SA (K-DUR,KLOR-CON) 20 MEQ tablet Take 2 tablets (40 mEq total) by mouth 2 (two) times daily. And an additional 26meq ever Wednesday with metolazone 03/09/17  Yes Darylene Price A, FNP  prochlorperazine (COMPAZINE) 10 MG tablet Take 1 tablet (10 mg total) by mouth every 8 (eight) hours as needed for nausea or vomiting. 09/05/16  Yes Birdie Sons, MD  Pyridoxine HCl (VITAMIN B6) 200 MG TABS Take 200 tablets by mouth 2 (two) times daily.  11/16/13  Yes [provider]  torsemide (DEMADEX) 20 MG tablet Take 2 tablets (40 mg total) by mouth 2 (two) times daily. 05/15/17  Yes Darylene Price A, FNP  warfarin (COUMADIN) 2 MG tablet Take 2 mg by mouth every other day. Take 2mg  one day and take 2.5mg  next day, alternating.   Yes [provider]  warfarin (COUMADIN) 2.5 MG tablet Take 2.5 mg by mouth every other day. Take 2mg  one day and take 2.5mg  next day, alternating. 11/16/13  Yes [provider]      PHYSICAL EXAMINATION:   VITAL SIGNS: Blood pressure 139/82, pulse (!) 155, temperature 100.2 F (37.9 C), temperature source Oral, resp. rate 16, SpO2 95 %.  GENERAL:  77  y.o.-year-old patient lying in the bed with no acute distress.  EYES: Pupils equal, round, reactive to light and accommodation. No scleral icterus. Extraocular muscles intact.  HEENT: Head atraumatic, normocephalic. Oropharynx and nasopharynx clear.  NECK:  Supple, no jugular venous distention. No thyroid enlargement, no tenderness.  LUNGS: Normal breath sounds bilaterally, no wheezing, rales,rhonchi or crepitation. No use of accessory muscles of respiration.  CARDIOVASCULAR: Irregularly irregular tachycardic no murmurs, rubs, or gallops.  ABDOMEN: Soft, nontender, nondistended. Bowel sounds present. No organomegaly or mass.  EXTREMITIES: No pedal edema, cyanosis, or clubbing.  NEUROLOGIC: Cranial nerves II through XII are intact. Muscle strength 5/5 in all extremities. Sensation intact. Gait not checked.  PSYCHIATRIC: The patient is alert and oriented x 3.  SKIN: No obvious rash, lesion,  or ulcer.   LABORATORY PANEL:   CBC Recent Labs  Lab 05/21/17 1320  WBC 6.8  HGB 12.0  HCT 36.1  PLT 174  MCV 85.1  MCH 28.3  MCHC 33.3  RDW 15.5*   ------------------------------------------------------------------------------------------------------------------  Chemistries  Recent Labs  Lab 05/21/17 1320  NA 140  K 3.3*  CL 97*  CO2 32  GLUCOSE 106*  BUN 10  CREATININE 0.88  CALCIUM 9.0   ------------------------------------------------------------------------------------------------------------------ estimated creatinine clearance is 50.4 mL/min (by C-G formula based on SCr of 0.88 mg/dL). ------------------------------------------------------------------------------------------------------------------ No results for input(s): TSH, T4TOTAL, T3FREE, THYROIDAB in the last 72 hours.  Invalid input(s): FREET3   Coagulation profile Recent Labs  Lab 05/21/17 1320  INR 3.17    ------------------------------------------------------------------------------------------------------------------- No results for input(s): DDIMER in the last 72 hours. -------------------------------------------------------------------------------------------------------------------  Cardiac Enzymes Recent Labs  Lab 05/21/17 1320  TROPONINI <0.03   ------------------------------------------------------------------------------------------------------------------ Invalid input(s): POCBNP  ---------------------------------------------------------------------------------------------------------------  Urinalysis    Component Value Date/Time   COLORURINE AMBER (A) 08/28/2016 1218   APPEARANCEUR CLOUDY (A) 08/28/2016 1218   LABSPEC 1.023 08/28/2016 1218   PHURINE 5.0 08/28/2016 1218   GLUCOSEU 50 (A) 08/28/2016 1218   HGBUR LARGE (A) 08/28/2016 [place] 08/28/2016 [place] NEGATIVE 08/28/2016 1218   PROTEINUR 100 (A) 08/28/2016 1218   NITRITE NEGATIVE 08/28/2016 1218   LEUKOCYTESUR SMALL (A) 08/28/2016 1218     RADIOLOGY: Dg Chest 2 View  Result Date: 05/21/2017 CLINICAL DATA:  Tachycardia EXAM: CHEST  2 VIEW COMPARISON:  08/28/2016, 04/03/2016, 03/10/2016 FINDINGS: Persistent small right-sided pleural effusion with fluid along the fissure. Atelectasis or infiltrate at the right middle lobe and right base. Patchy atelectasis or infiltrate at the left lung base but with some clearing since prior study. Mild cardiomegaly with vascular congestion. No pneumothorax. Degenerative changes of the spine. IMPRESSION: 1. Small right-sided pleural effusion with atelectasis or pneumonia at the right middle lobe and right base. Patchy atelectasis or minimal infiltrate at the left base as well. 2. Cardiomegaly with mild central vascular congestion. Electronically Signed   By: Donavan Foil M.D.   On: 05/21/2017 13:56    EKG: Orders placed or performed during the  hospital encounter of 05/21/17  . EKG 12-Lead  . EKG 12-Lead  . ED EKG within 10 minutes  . ED EKG within 10 minutes    IMPRESSION AND PLAN:  Patient is a 77 year old with history of atrial fibrillation presenting with shortness of breath  1.  A. fib with RVR patient will be started on IV Cardizem drip will titrate it Continue Coumadin Continue metoprolol Continue flecainide  2.  Acute respiratory failure on chronic respiratory failure Chest x-ray suggestive of pneumonia We will treat with IV Levaquin Check pro-calcitonin Flu test pending  3.  Chronic diastolic CHF currently compensated Continue Zaroxolyn and Demadex  4.  GERD continue PPI  5.  Miscellaneous patient on Coumadin that should be good for DVT prophylaxis   All the records are reviewed and case discussed with ED provider. Management plans discussed with the patient, family and they are in agreement.  CODE STATUS: Code Status History    Date Active Date Inactive Code Status Order ID Comments User Context   08/26/2016 14:11 08/31/2016 16:59 Full Code [id number]  Epifanio Lesches, MD ED   08/04/2016 22:21 08/14/2016 20:40 Full Code [id number]  Harvie Bridge, DO Inpatient   02/29/2016 20:53 03/03/2016 14:03 Full Code [id number]  Henreitta Leber, MD Inpatient  TOTAL TIME TAKING CARE OF THIS PATIENT: 2minutes.    Dustin Flock M.D on 05/21/2017 at 2:48 PM  Between 7am to 6pm - Pager - [phone]  After 6pm go to [email or web address] - password [email or web address] [facility]  [facility]  Office  [phone]  CC: Primary care physician; Jerrol Banana., MD

## 2017-05-21 NOTE — Progress Notes (Signed)
ANTICOAGULATION CONSULT NOTE - Initial Consult  Pharmacy Consult for warfarin Indication: atrial fibrillation  Allergies  Allergen Reactions  . Ampicillin   . Prinzide  [Lisinopril-Hydrochlorothiazide]   . Penicillins Rash    .Has patient had a PCN reaction causing immediate rash, facial/tongue/throat swelling, SOB or lightheadedness with hypotension: Unknown Has patient had a PCN reaction causing severe rash involving mucus membranes or skin necrosis: No Has patient had a PCN reaction that required hospitalization: No Has patient had a PCN reaction occurring within the last 10 years: No If all of the above answers are "NO", then may proceed with Cephalosporin use.     Patient Measurements:    Vital Signs: Temp: 100.2 F (37.9 C) (02/28 1313) Temp Source: Oral (02/28 1313) BP: 139/82 (02/28 1313) Pulse Rate: 155 (02/28 1313)  Labs: Recent Labs    05/21/17 1320  HGB 12.0  HCT 36.1  PLT 174  LABPROT 32.3*  INR 3.17  CREATININE 0.88  TROPONINI <0.03    Estimated Creatinine Clearance: 50.4 mL/min (by C-G formula based on SCr of 0.88 mg/dL).   Medical History: Past Medical History:  Diagnosis Date  . A-fib ([place])   . CHF (congestive heart failure) ([place])   . Hypertension   . Sleep apnea     Assessment: Pharmacy consulted to dose and monitor warfarin in this 77 year old female taking warfarin PTA for atrial fibrillation.   Home dose: Alternating between 2 mg and 2.5 mg PO Daily  Goal of Therapy:  INR 2-3 Monitor platelets by anticoagulation protocol: Yes   Plan:  INR = 3.2 is elevated on admission. Patient being initiated on levofloxacin on admission. Will hold warfarin dose this evening and recheck INR with AM labs tomorrow.  Lenis Noon, PharmD, BCPS Clinical Pharmacist 05/21/2017,3:05 PM

## 2017-05-21 NOTE — Progress Notes (Signed)
[facility]  No chief complaint on file.   Subjective:    Patient ID: Hannah Faulkner, female    DOB: 01/07/1941, 77 y.o.   MRN: [id number]  Upper Respiratory Infection: Hannah Faulkner is a 77 y.o. female with a past medical history significant for atrial fibrillation on chronic warfarin therapy, 2L oxygen via nasal cannula, and chronic diastolic heart failure on torsemide, diltiazem and metorpolol complaining of cough and congestion. She reports she has felt this way for several days and had a slight fever yesterday but did not take her temperature today. She denies chest pain, dizziness, confusion. She reports she took her weekly dose of extra diuretics yesterday. She reports she has not taken any of her medications today except for her felcainide.   Review of Systems  Constitutional: Positive for fatigue and fever. Negative for activity change, appetite change, chills, diaphoresis and unexpected weight change.  Respiratory: Positive for cough. Negative for apnea, choking, chest tightness, shortness of breath, wheezing and stridor.   Gastrointestinal: Negative.   Neurological: Negative for dizziness, light-headedness and headaches.       Objective:   There were no vitals taken for this visit.  Patient Active Problem List   Diagnosis Date Noted  . Hypokalemia 11/20/2016  . Lymphedema of both lower extremities 11/20/2016  . Peripheral edema 11/20/2016  . Chronic diastolic heart failure ([place]) 09/04/2016  . Restless legs 09/04/2016  . Fatigue 09/04/2016  . Sterile pyuria 08/31/2016  . Hyperglycemia 08/31/2016  . Acute diastolic heart failure ([place]) 08/26/2016  . Community acquired pneumonia 08/04/2016  . Tricuspid regurgitation 05/15/2015  . Mitral valve regurgitation 05/15/2015  . Anxiety 08/25/2014  . Atrial fibrillation, chronic ([place]) 08/25/2014  . Benign neoplasm of colon 08/25/2014  . 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 Diabetic  neuropathy ([place]) 08/25/2014  . Essential hypertension 08/25/2014  . 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 Adiposity 08/25/2014  . Obstructive apnea 08/25/2014  . Hypercholesterolemia without hypertriglyceridemia 08/25/2014  . B12 deficiency 08/25/2014  . H/O cardiac catheterization 07/15/2013  . Apnea, sleep 07/15/2013  . AF (paroxysmal atrial fibrillation) ([place]) 07/15/2013    Outpatient Encounter Medications as of 05/21/2017  Medication Sig  . ALPRAZolam (XANAX) 0.5 MG tablet TAKE 1 TABLET BY MOUTH AT BEDTIME AS NEEDED  . cyanocobalamin 1000 MCG tablet Take 1,000 mcg by mouth daily.  Marland Kitchen diltiazem (CARDIZEM) 120 MG tablet Take 120 mg by mouth 2 times daily at 12 noon and 4 pm.   . flecainide (TAMBOCOR) 50 MG tablet Take 50 mg by mouth 2 (two) times daily.   Marland Kitchen gabapentin (NEURONTIN) 100 MG capsule TAKE 1 CAPSULE (100 MG TOTAL) BY MOUTH 3 (THREE) TIMES DAILY.  . metolazone (ZAROXOLYN) 2.5 MG tablet Take 1 tablet (2.5 mg total) by mouth once a week.  . metoprolol tartrate (LOPRESSOR) 100 MG tablet Take 1 tablet (100 mg total) by mouth 2 (two) times daily.  Marland Kitchen omeprazole (PRILOSEC) 20 MG capsule Take 1 capsule (20 mg total) by mouth at bedtime.  . potassium chloride SA (K-DUR,KLOR-CON) 20 MEQ tablet Take 2 tablets (40 mEq total) by mouth 2 (two) times daily. And an additional 20meq ever Wednesday with metolazone  . PROAIR HFA 108 (90 Base) MCG/ACT inhaler INHALE 2 INHALATIONS INTO THE LUNGS EVERY 6 (SIX) HOURS AS NEEDED FOR WHEEZING.  Marland Kitchen prochlorperazine (COMPAZINE) 10 MG tablet Take 1 tablet (10 mg total) by mouth every 8 (eight) hours as  needed for nausea or vomiting.  . Pyridoxine HCl (VITAMIN B6) 200 MG TABS Take 200 tablets by mouth 2 (two) times daily.   Marland Kitchen torsemide (DEMADEX) 20 MG tablet Take 2 tablets (40 mg total) by mouth 2 (two) times daily.  Marland Kitchen warfarin (COUMADIN) 2 MG tablet Take 2 mg by mouth every other day. Take  2mg  one day and take 2.5mg  next day, alternating.  . warfarin (COUMADIN) 2.5 MG tablet Take 2.5 mg by mouth every other day. Take 2mg  one day and take 2.5mg  next day, alternating.   No facility-administered encounter medications on file as of 05/21/2017.     Allergies  Allergen Reactions  . Ampicillin   . Prinzide  [Lisinopril-Hydrochlorothiazide]   . Penicillins Rash    .Has patient had a PCN reaction causing immediate rash, facial/tongue/throat swelling, SOB or lightheadedness with hypotension: Unknown Has patient had a PCN reaction causing severe rash involving mucus membranes or skin necrosis: No Has patient had a PCN reaction that required hospitalization: No Has patient had a PCN reaction occurring within the last 10 years: No If all of the above answers are "NO", then may proceed with Cephalosporin use.        Physical Exam  Constitutional: She is oriented to person, place, and time. She appears well-developed and well-nourished. No distress.  Cardiovascular: An irregularly irregular rhythm present. Tachycardia present.  Pulmonary/Chest: Effort normal. She has no wheezes.  Diminished lung sounds in bases bilaterally.   Musculoskeletal: She exhibits edema.  There is nonpitting edema in her extremities bilaterally.   Neurological: She is alert and oriented to person, place, and time.  Skin: Skin is warm and dry.  Psychiatric: She has a normal mood and affect. Her behavior is normal.       Assessment & Plan:  1. Chronic diastolic heart failure (HCC)  Hannah Motto [place] has chronic diastolic heart failure, atrial fibrillation on chronic warfarin therapy. She is here today with cough and congestion. She is on 2L nasal cannula and her oxygen is 93% today when it is normally 97%. Her pulse is ranging from 140 to 168 and is irregular. She is not SOB and denies chest pain, does have some bilateral lower extremity. She reports she did not take her medications this morning, but  nevertheless, her pulse ox is decreased compared to baseline and she is tachycardic and presumably in a-fib with RVR. Needs emergent evaluation and have directed to ER and called ahead.   2. Atrial fibrillation, chronic ([place])   3. Essential hypertension   4. Warfarin anticoagulation  Return if symptoms worsen or fail to improve.    The entirety of the information documented in the History of Present Illness, Review of Systems and Physical Exam were personally obtained by me. Portions of this information were initially documented by Ashley Royalty, CMA and reviewed by me for thoroughness and accuracy.

## 2017-05-21 NOTE — Progress Notes (Signed)
   05/21/17 1819  Clinical Encounter Type  Visited With Patient and family together  Visit Type Initial (order for advanced directive)  Referral From Nurse  Consult/Referral To Chaplain   Chaplain responded to order regarding advanced directives.  Upon arrival, chaplain met patient grandson and his spouse in the hallway; patient was receiving care.  Offered emotional support and began education on advanced directives.  Entered patient room after care completed, met with patient and son, provided advanced directive education, left document with patient's son.  Encouraged patient and family to contact chaplain with any questions or when patient is ready to complete the document.

## 2017-05-21 NOTE — Progress Notes (Signed)
CODE SEPSIS - PHARMACY COMMUNICATION  **Broad Spectrum Antibiotics should be administered within 1 hour of Sepsis diagnosis**  Time Code Sepsis Called/Page Received: 1439  Antibiotics Ordered: levofloxacin  Time of 1st antibiotic administration: [place] ,PharmD Clinical Pharmacist  05/21/2017  3:14 PM

## 2017-05-21 NOTE — Progress Notes (Signed)
Patients HR sustaining in the 150's on cardizem gtt.  Patel called and orders to switch to amio gtt to see if we can get better control of patients HR.

## 2017-05-21 NOTE — ED Triage Notes (Signed)
Pt to ED via POV from PCP for tachycardia. Pt c/o nasal congestion and the reason for her visit with PCP/. Pt HR currently 160 in triage. Pt denies any SOB or CP.

## 2017-05-21 NOTE — Progress Notes (Signed)
Pharmacy Antibiotic Note  Hannah Faulkner is a 77 y.o. female admitted on 05/21/2017 with pneumonia.  Pharmacy has been consulted for levofloxacin dosing.  Plan: Levofloxacin 750 mg IV once followed by levofloxacin 500 mg IV daily    Temp (24hrs), Avg:100.2 F (37.9 C), Min:100.2 F (37.9 C), Max:100.2 F (37.9 C)  Recent Labs  Lab 05/21/17 1320  WBC 6.8  CREATININE 0.88    Estimated Creatinine Clearance: 50.4 mL/min (by C-G formula based on SCr of 0.88 mg/dL).    Allergies  Allergen Reactions  . Ampicillin   . Prinzide  [Lisinopril-Hydrochlorothiazide]   . Penicillins Rash    .Has patient had a PCN reaction causing immediate rash, facial/tongue/throat swelling, SOB or lightheadedness with hypotension: Unknown Has patient had a PCN reaction causing severe rash involving mucus membranes or skin necrosis: No Has patient had a PCN reaction that required hospitalization: No Has patient had a PCN reaction occurring within the last 10 years: No If all of the above answers are "NO", then may proceed with Cephalosporin use.    Antimicrobials this admission: levofloxacin 2/28 >>   Dose adjustments this admission:  Microbiology results: 2/28 BCx: Sent  Thank you for allowing pharmacy to be a part of this patient's care.  Lenis Noon, PharmD, BCPS Clinical Pharmacist 05/21/2017 3:03 PM

## 2017-05-21 NOTE — Patient Instructions (Signed)

## 2017-05-22 ENCOUNTER — Other Ambulatory Visit: Payer: Self-pay

## 2017-05-22 LAB — BASIC METABOLIC PANEL
Anion gap: 11 (ref 5–15)
BUN: 13 mg/dL (ref 6–20)
CALCIUM: 8.6 mg/dL — AB (ref 8.9–10.3)
CO2: 28 mmol/L (ref 22–32)
CREATININE: 1.2 mg/dL — AB (ref 0.44–1.00)
Chloride: 98 mmol/L — ABNORMAL LOW (ref 101–111)
GFR calc Af Amer: 50 mL/min — ABNORMAL LOW (ref >60)
GFR, EST NON AFRICAN AMERICAN: 43 mL/min — AB (ref >60)
GLUCOSE: 135 mg/dL — AB (ref 65–99)
Potassium: 3.6 mmol/L (ref 3.5–5.1)
SODIUM: 137 mmol/L (ref 135–145)

## 2017-05-22 LAB — CBC
HCT: 35.9 % (ref 35.0–47.0)
Hemoglobin: 11.6 g/dL — ABNORMAL LOW (ref 12.0–16.0)
MCH: 28 pg (ref 26.0–34.0)
MCHC: 32.4 g/dL (ref 32.0–36.0)
MCV: 86.4 fL (ref 80.0–100.0)
PLATELETS: 194 10*3/uL (ref 150–440)
RBC: 4.15 MIL/uL (ref 3.80–5.20)
RDW: 16.1 % — AB (ref 11.5–14.5)
WBC: 10 10*3/uL (ref 3.6–11.0)

## 2017-05-22 LAB — PROTIME-INR
INR: 3.44
PROTHROMBIN TIME: 34.4 s — AB (ref 11.4–15.2)

## 2017-05-22 MED ORDER — IPRATROPIUM-ALBUTEROL 0.5-2.5 (3) MG/3ML IN SOLN
3.0000 mL | RESPIRATORY_TRACT | Status: DC | PRN
  Administered 2017-05-22: 3 mL via RESPIRATORY_TRACT
  Filled 2017-05-22: qty 3

## 2017-05-22 MED ORDER — WARFARIN - PHARMACIST DOSING INPATIENT: Freq: Every day | Status: DC

## 2017-05-22 NOTE — Progress Notes (Signed)
[facility] [place] NAME: Hannah Faulkner    MR#:  [id number]  DATE OF BIRTH:  1940/10/29  SUBJECTIVE:   Patient was sent from her PCP office due to atrial fibrillation with RVR.  REVIEW OF SYSTEMS:    Review of Systems  Constitutional: Negative for fever, chills weight loss HENT: Negative for ear pain, nosebleeds, congestion, facial swelling, rhinorrhea, neck pain, neck stiffness and ear discharge.   Respiratory: Positive for cough, shortness of breath, no wheezing  Cardiovascular: Negative for chest pain, palpitations and leg swelling.  Gastrointestinal: Negative for heartburn, abdominal pain, vomiting, diarrhea or consitpation Genitourinary: Negative for dysuria, urgency, frequency, hematuria Musculoskeletal: Negative for back pain or joint pain Neurological: Negative for dizziness, seizures, syncope, focal weakness,  numbness and headaches.  Hematological: Does not bruise/bleed easily.  Psychiatric/Behavioral: Negative for hallucinations, confusion, dysphoric mood    Tolerating Diet: yes      DRUG ALLERGIES:   Allergies  Allergen Reactions  . Ampicillin   . Prinzide  [Lisinopril-Hydrochlorothiazide]   . Penicillins Rash    .Has patient had a PCN reaction causing immediate rash, facial/tongue/throat swelling, SOB or lightheadedness with hypotension: Unknown Has patient had a PCN reaction causing severe rash involving mucus membranes or skin necrosis: No Has patient had a PCN reaction that required hospitalization: No Has patient had a PCN reaction occurring within the last 10 years: No If all of the above answers are "NO", then may proceed with Cephalosporin use.     VITALS:  Blood pressure 110/84, pulse 84, temperature 98.2 F (36.8 C), temperature source Oral, resp. rate 18, height 5\' 4"  (1.626 m), weight 76.2 kg (167 lb 14.4 oz), SpO2 94 %.  PHYSICAL EXAMINATION:  Constitutional: Appears well-developed and well-nourished.  No distress. HENT: Normocephalic. Marland Kitchen Oropharynx is clear and moist.  Eyes: Conjunctivae and EOM are normal. PERRLA, no scleral icterus.  Neck: Normal ROM. Neck supple. No JVD. No tracheal deviation. CVS: Irregular, irregular S1/S2 +, no murmurs, no gallops, no carotid bruit.  Pulmonary: Effort and breath sounds normal, no stridor, rhonchi, wheezes, rales.  Abdominal: Soft. BS +,  no distension, tenderness, rebound or guarding.  Musculoskeletal: Normal range of motion. No edema and no tenderness.  Neuro: Alert. CN 2-12 grossly intact. No focal deficits. Skin: Skin is warm and dry. No rash noted. Psychiatric: Normal mood and affect.      LABORATORY PANEL:   CBC Recent Labs  Lab 05/22/17 0452  WBC 10.0  HGB 11.6*  HCT 35.9  PLT 194   ------------------------------------------------------------------------------------------------------------------  Chemistries  Recent Labs  Lab 05/22/17 0452  NA 137  K 3.6  CL 98*  CO2 28  GLUCOSE 135*  BUN 13  CREATININE 1.20*  CALCIUM 8.6*   ------------------------------------------------------------------------------------------------------------------  Cardiac Enzymes Recent Labs  Lab 05/21/17 1320  TROPONINI <0.03   ------------------------------------------------------------------------------------------------------------------  RADIOLOGY:  Dg Chest 2 View  Result Date: 05/21/2017 CLINICAL DATA:  Tachycardia EXAM: CHEST  2 VIEW COMPARISON:  08/28/2016, 04/03/2016, 03/10/2016 FINDINGS: Persistent small right-sided pleural effusion with fluid along the fissure. Atelectasis or infiltrate at the right middle lobe and right base. Patchy atelectasis or infiltrate at the left lung base but with some clearing since prior study. Mild cardiomegaly with vascular congestion. No pneumothorax. Degenerative changes of the spine. IMPRESSION: 1. Small right-sided pleural effusion with atelectasis or pneumonia at the right middle lobe and right  base. Patchy atelectasis or minimal infiltrate at the left base as well. 2. Cardiomegaly with mild central vascular  congestion. Electronically Signed   By: Donavan Foil M.D.   On: 05/21/2017 13:56     ASSESSMENT AND PLAN:   77 year old female with chronic diastolic heart failure with preserved ejection fraction, PAF and chronic hypoxic respiratory failure 2 L oxygen who presents from PCP office with atrial fibrillation and RVR.  1. Acute on chronic hypoxic respiratory failure in the setting of community-acquired pneumonia Oxygen to be willing to baseline 2 L   2. Community-acquired pneumonia: Continue Levaquin for treatment for 5 days.  3. Atrial fibrillation with RVR: Heart rate is better controlled. Continue outpatient regimen including metoprolol, digoxin and flecainide Pharmacy consultation for Coumadin  4. Chronic diastolic heart failure with preserved ejection fraction currently compensated: Continue torsemide and Demadex Follow BMP Follow intake and output with daily weight  5. Acute kidney injury, mild: Repeat BMP in a.m.  PT consult for discharge planning   Management plans discussed with the patient and she is in agreement.  CODE STATUS: FULL  TOTAL TIME TAKING CARE OF THIS PATIENT: 30 minutes.     POSSIBLE D/C tomorrow, DEPENDING ON CLINICAL CONDITION.   Evelen Vazguez M.D on 05/22/2017 at 12:24 PM  Between 7am to 6pm - Pager - [phone] After 6pm go to [email or web address] - password [email or web address] [facility]  Office  [phone]  CC: Primary care physician; Jerrol Banana., MD  Note: This dictation was prepared with Dragon dictation along with smaller phrase technology. Any transcriptional errors that result from this process are unintentional.

## 2017-05-22 NOTE — Progress Notes (Signed)
Pt HR on monitor showing afib 105-110. Pt currently is on amiodarone drip at 33.69ml/h with no concerns offered. Will continue to monitor.

## 2017-05-22 NOTE — Care Management (Signed)
Presents from MD office with rapid A fib that initially required amiodarone infusion which has been discontinued.  Chronic oxygen and chronic coumadin.  PCP- Rosanna Randy and uses [facility] in [place]. PT consult pending

## 2017-05-22 NOTE — Consult Note (Signed)
[facility]  CARDIOLOGY CONSULT NOTE  Patient ID: Hannah Faulkner MRN: [id number] DOB/AGE: 1940/03/28 77 y.o.  Admit date: 05/21/2017 Referring Physician Mody Primary Physician Rosanna Randy Primary Cardiologist Yatziry Deakins Reason for Consultation atrial fibrillation with a rapid ventricular rate  HPI: 77 year old female referred for evaluation of atrial fibrillation with rapid ventricular rate.  Patient has known history of chronic diastolic congestive heart failure and paroxysmal atrial fibrillation.  Earlier this week, the patient experienced fever and chills symptoms suggestive of upper respiratory infection.  She went to see her primary care provider and was noted to be tachycardic and sent to [facility] emergency room where she was noted to be in atrial fibrillation with rapid ventricular rate.  Chest x-ray suggestive of right middle lobe pneumonia with right-sided pleural effusion.  Review of systems complete and found to be negative unless listed above     Past Medical History:  Diagnosis Date  . A-fib ([place])   . CHF (congestive heart failure) ([place])   . Hypertension   . Sleep apnea     Past Surgical History:  Procedure Laterality Date  . EYE SURGERY    . POLYPECTOMY     colon poly premoved    Medications Prior to Admission  Medication Sig Dispense Refill Last Dose  . albuterol (PROVENTIL HFA;VENTOLIN HFA) 108 (90 Base) MCG/ACT inhaler Inhale 2 puffs into the lungs every 6 (six) hours as needed for wheezing or shortness of breath.   PRN at PRN  . ALPRAZolam (XANAX) 0.5 MG tablet TAKE 1 TABLET BY MOUTH AT BEDTIME AS NEEDED 30 tablet 1 PRN at PRN  . cyanocobalamin 1000 MCG tablet Take 1,000 mcg by mouth daily.   05/21/2017 at AM  . diltiazem (CARDIZEM) 120 MG tablet Take 120 mg by mouth 2 times daily at 12 noon and 4 pm.    05/20/2017 at PM  . flecainide (TAMBOCOR) 50 MG tablet Take 50 mg by mouth 2 (two) times daily.    05/21/2017 at AM  . gabapentin (NEURONTIN) 100 MG capsule TAKE 1  CAPSULE (100 MG TOTAL) BY MOUTH 3 (THREE) TIMES DAILY. 270 capsule 2 05/21/2017 at AM  . magnesium oxide (MAG-OX) 400 MG tablet Take 400 mg by mouth daily.   05/20/2017 at N/A  . metolazone (ZAROXOLYN) 2.5 MG tablet Take 1 tablet (2.5 mg total) by mouth once a week. 4 tablet 6 05/20/2017 at AM  . metoprolol tartrate (LOPRESSOR) 100 MG tablet Take 1 tablet (100 mg total) by mouth 2 (two) times daily. 60 tablet 3 05/20/2017 at AM  . omeprazole (PRILOSEC) 20 MG capsule Take 1 capsule (20 mg total) by mouth at bedtime. 30 capsule 11 05/20/2017 at PM  . potassium chloride SA (K-DUR,KLOR-CON) 20 MEQ tablet Take 2 tablets (40 mEq total) by mouth 2 (two) times daily. And an additional 62meq ever Wednesday with metolazone 124 tablet 5 05/20/2017 at PM  . prochlorperazine (COMPAZINE) 10 MG tablet Take 1 tablet (10 mg total) by mouth every 8 (eight) hours as needed for nausea or vomiting. 30 tablet 1 PRN at PRN  . Pyridoxine HCl (VITAMIN B6) 200 MG TABS Take 200 tablets by mouth 2 (two) times daily.    05/21/2017 at AM  . torsemide (DEMADEX) 20 MG tablet Take 2 tablets (40 mg total) by mouth 2 (two) times daily. 120 tablet 5 05/20/2017 at PM  . warfarin (COUMADIN) 2 MG tablet Take 2 mg by mouth every other day. Take 2mg  one day and take 2.5mg  next day, alternating.  05/20/2017 at N/A  . warfarin (COUMADIN) 2.5 MG tablet Take 2.5 mg by mouth every other day. Take 2mg  one day and take 2.5mg  next day, alternating.   05/19/2017 at N/A   Social History   Socioeconomic History  . Marital status: Widowed    Spouse name: Not on file  . Number of children: 3  . Years of education: HS  . Highest education level: 12th grade  Social Needs  . Financial resource strain: Not hard at all  . Food insecurity - worry: Never true  . Food insecurity - inability: Never true  . Transportation needs - medical: Yes  . Transportation needs - non-medical: Yes  Occupational History    Employer: RETIRED  Tobacco Use  . Smoking status:  Never Smoker  . Smokeless tobacco: Never Used  Substance and Sexual Activity  . Alcohol use: No  . Drug use: No  . Sexual activity: Not on file  Other Topics Concern  . Not on file  Social History Narrative  . Not on file    Family History  Problem Relation Age of Onset  . Hypertension Mother   . Heart disease Mother   . CVA Mother   . Heart attack Mother   . Lung cancer Father   . Heart attack Sister   . CVA Sister   . Thyroid disease Sister   . Liver cancer Brother   . Thyroid disease Sister   . Thyroid disease Sister   . Diabetes Sister   . Dementia Sister   . Atrial fibrillation Sister       Review of systems complete and found to be negative unless listed above      PHYSICAL EXAM  General: Well developed, well nourished, in no acute distress HEENT:  Normocephalic and atramatic Neck:  No JVD.  Lungs: Clear bilaterally to auscultation and percussion. Heart: HRRR . Normal S1 and S2 without gallops or murmurs.  Abdomen: Bowel sounds are positive, abdomen soft and non-tender  Msk:  Back normal, normal gait. Normal strength and tone for age. Extremities: No clubbing, cyanosis or edema.   Neuro: Alert and oriented X 3. Psych:  Good affect, responds appropriately  Labs:   Lab Results  Component Value Date   WBC 10.0 05/22/2017   HGB 11.6 (L) 05/22/2017   HCT 35.9 05/22/2017   MCV 86.4 05/22/2017   PLT 194 05/22/2017    Recent Labs  Lab 05/22/17 0452  NA 137  K 3.6  CL 98*  CO2 28  BUN 13  CREATININE 1.20*  CALCIUM 8.6*  GLUCOSE 135*   Lab Results  Component Value Date   TROPONINI <0.03 05/21/2017    Lab Results  Component Value Date   CHOL 148 11/27/2015   CHOL 164 02/22/2015   CHOL 169 09/13/2013   Lab Results  Component Value Date   HDL 42 11/27/2015   HDL 49 02/22/2015   HDL 46 09/13/2013   Lab Results  Component Value Date   LDLCALC 84 11/27/2015   LDLCALC 93 02/22/2015   LDLCALC 99 09/13/2013   Lab Results  Component Value  Date   TRIG 109 11/27/2015   TRIG 112 02/22/2015   TRIG 122 09/13/2013   Lab Results  Component Value Date   CHOLHDL 3.5 11/27/2015   No results found for: LDLDIRECT    Radiology: Dg Chest 2 View  Result Date: 05/21/2017 CLINICAL DATA:  Tachycardia EXAM: CHEST  2 VIEW COMPARISON:  08/28/2016, 04/03/2016, 03/10/2016 FINDINGS: Persistent small right-sided  pleural effusion with fluid along the fissure. Atelectasis or infiltrate at the right middle lobe and right base. Patchy atelectasis or infiltrate at the left lung base but with some clearing since prior study. Mild cardiomegaly with vascular congestion. No pneumothorax. Degenerative changes of the spine. IMPRESSION: 1. Small right-sided pleural effusion with atelectasis or pneumonia at the right middle lobe and right base. Patchy atelectasis or minimal infiltrate at the left base as well. 2. Cardiomegaly with mild central vascular congestion. Electronically Signed   By: Donavan Foil M.D.   On: 05/21/2017 13:56    EKG: Atrial fibrillation with rapid ventricular rate  ASSESSMENT AND PLAN:   1.  Atrial fibrillation with rapid ventricular rate, in the setting of pneumonia, rate improved 2.  Chronic diastolic congestive heart failure 3.  Right middle lobe pneumonia  Recommendations  1.  Continue overall current therapy 2.  Continue flecainide 3.  DC amiodarone drip 4.  Further recommendations pending patient's initial clinical course  Signed: Isaias Cowman MD,PhD, [facility] 05/22/2017, 8:53 AM

## 2017-05-22 NOTE — Progress Notes (Signed)
ANTICOAGULATION CONSULT NOTE - [place] for warfarin Indication: atrial fibrillation  Allergies  Allergen Reactions  . Ampicillin   . Prinzide  [Lisinopril-Hydrochlorothiazide]   . Penicillins Rash    .Has patient had a PCN reaction causing immediate rash, facial/tongue/throat swelling, SOB or lightheadedness with hypotension: Unknown Has patient had a PCN reaction causing severe rash involving mucus membranes or skin necrosis: No Has patient had a PCN reaction that required hospitalization: No Has patient had a PCN reaction occurring within the last 10 years: No If all of the above answers are "NO", then may proceed with Cephalosporin use.     Patient Measurements: Height: 5\' 4"  (162.6 cm) Weight: 167 lb 14.4 oz (76.2 kg) IBW/kg (Calculated) : 54.7  Vital Signs: Temp: 98.2 F (36.8 C) (03/01 0832) Temp Source: Oral (03/01 0832) BP: 110/84 (03/01 0832) Pulse Rate: 84 (03/01 0832)  Labs: Recent Labs    05/21/17 1320 05/22/17 0452  HGB 12.0 11.6*  HCT 36.1 35.9  PLT 174 194  LABPROT 32.3* 34.4*  INR 3.17 3.44  CREATININE 0.88 1.20*  TROPONINI <0.03  --     Estimated Creatinine Clearance: 39.9 mL/min (A) (by C-G formula based on SCr of 1.2 mg/dL (H)).   Medical History: Past Medical History:  Diagnosis Date  . A-fib ([place])   . CHF (congestive heart failure) ([place])   . Hypertension   . Sleep apnea     Assessment: Pharmacy consulted to dose and monitor warfarin in this 77 year old female taking warfarin PTA for atrial fibrillation.   Home dose: Alternating between 2 mg and 2.5 mg PO Daily  2/28  INR 3.17     No warfarin 3/1    INR 3.44  Goal of Therapy:  INR 2-3 Monitor platelets by anticoagulation protocol: Yes   Plan:  INR = 3.44 is still elevated. Patient is also on levofloxacin. Will hold warfarin dose this evening and recheck INR with AM labs tomorrow.  Paulina Fusi, PharmD, BCPS 05/22/2017 9:56 AM

## 2017-05-22 NOTE — Plan of Care (Addendum)
Pt is A&Ox4. VSS . 2L O2  continued. Afib on monitor. Amio gtt d/c this shift per order. No complaints thus far.  Ambulatory. Will continue to monitor and report to oncoming RN .  Progressing Education: Knowledge of General Education information will improve 05/22/2017 1510 - Progressing by Aleen Campi, RN Health Behavior/Discharge Planning: Ability to manage health-related needs will improve 05/22/2017 1510 - Progressing by Aleen Campi, RN Clinical Measurements: Ability to maintain clinical measurements within normal limits will improve 05/22/2017 1510 - Progressing by Aleen Campi, RN Will remain free from infection 05/22/2017 1510 - Progressing by Aleen Campi, RN Diagnostic test results will improve 05/22/2017 1510 - Progressing by Aleen Campi, RN Respiratory complications will improve 05/22/2017 1510 - Progressing by Aleen Campi, RN Cardiovascular complication will be avoided 05/22/2017 1510 - Progressing by Aleen Campi, RN Activity: Risk for activity intolerance will decrease 05/22/2017 1510 - Progressing by Aleen Campi, RN Nutrition: Adequate nutrition will be maintained 05/22/2017 1510 - Progressing by Aleen Campi, RN Coping: Level of anxiety will decrease 05/22/2017 1510 - Progressing by Aleen Campi, RN Elimination: Will not experience complications related to bowel motility 05/22/2017 1510 - Progressing by Aleen Campi, RN Will not experience complications related to urinary retention 05/22/2017 1510 - Progressing by Aleen Campi, RN Pain Managment: General experience of comfort will improve 05/22/2017 1510 - Progressing by Aleen Campi, RN Safety: Ability to remain free from injury will improve 05/22/2017 1510 - Progressing by Aleen Campi, RN Skin Integrity: Risk for impaired skin integrity will decrease 05/22/2017 1510 - Progressing by Aleen Campi, RN Spiritual Needs Ability to function at adequate level 05/22/2017 1510 - Progressing by Aleen Campi,  RN Cardiac: Ability to achieve and maintain adequate cardiopulmonary perfusion will improve 05/22/2017 1510 - Progressing by Willeen Novak, [place] Behavior/Discharge Planning: Ability to safely manage health-related needs after discharge will improve 05/22/2017 1510 - Progressing by Aleen Campi, RN

## 2017-05-23 ENCOUNTER — Inpatient Hospital Stay: Payer: Medicare Other

## 2017-05-23 LAB — PROTIME-INR
INR: 2.81
PROTHROMBIN TIME: 29.4 s — AB (ref 11.4–15.2)

## 2017-05-23 MED ORDER — GUAIFENESIN ER 600 MG PO TB12: 600.0000 mg | ORAL_TABLET | Freq: Two times a day (BID) | ORAL | 0 refills | Status: AC

## 2017-05-23 MED ORDER — LEVOFLOXACIN 500 MG PO TABS: 500.0000 mg | ORAL_TABLET | Freq: Every day | ORAL | 0 refills | Status: AC

## 2017-05-23 MED ORDER — WARFARIN SODIUM 2.5 MG PO TABS
2.5000 mg | ORAL_TABLET | Freq: Once | ORAL | Status: DC
  Filled 2017-05-23: qty 1

## 2017-05-23 NOTE — Progress Notes (Signed)
ANTICOAGULATION CONSULT NOTE - [place] for warfarin Indication: atrial fibrillation  Allergies  Allergen Reactions  . Ampicillin   . Prinzide  [Lisinopril-Hydrochlorothiazide]   . Penicillins Rash    .Has patient had a PCN reaction causing immediate rash, facial/tongue/throat swelling, SOB or lightheadedness with hypotension: Unknown Has patient had a PCN reaction causing severe rash involving mucus membranes or skin necrosis: No Has patient had a PCN reaction that required hospitalization: No Has patient had a PCN reaction occurring within the last 10 years: No If all of the above answers are "NO", then may proceed with Cephalosporin use.     Patient Measurements: Height: 5\' 4"  (162.6 cm) Weight: 167 lb 14.4 oz (76.2 kg) IBW/kg (Calculated) : 54.7  Vital Signs: Temp: 98.3 F (36.8 C) (03/02 0831) Temp Source: Oral (03/02 0831) BP: 95/55 (03/02 0831) Pulse Rate: 129 (03/02 0930)  Labs: Recent Labs    05/21/17 1320 05/22/17 0452 05/23/17 0528  HGB 12.0 11.6*  --   HCT 36.1 35.9  --   PLT 174 194  --   LABPROT 32.3* 34.4* 29.4*  INR 3.17 3.44 2.81  CREATININE 0.88 1.20*  --   TROPONINI <0.03  --   --     Estimated Creatinine Clearance: 39.9 mL/min (A) (by C-G formula based on SCr of 1.2 mg/dL (H)).   Medical History: Past Medical History:  Diagnosis Date  . A-fib ([place])   . CHF (congestive heart failure) ([place])   . Hypertension   . Sleep apnea     Assessment: Pharmacy consulted to dose and monitor warfarin in this 77 year old female taking warfarin PTA for atrial fibrillation.  Patient is also on levofloxacin.   Home dose: Alternating between 2 mg and 2.5 mg PO Daily  2/28  INR 3.17     No warfarin 3/1    INR 3.44     No warfarin 3/2    INR 2.81  Goal of Therapy:  INR 2-3 Monitor platelets by anticoagulation protocol: Yes   Plan:  INR therapeutic. Will order warfarin 2.5mg  tonight and recheck INR with AM labs  tomorrow.  Olivia Canter, [facility] 05/23/2017 10:12 AM

## 2017-05-23 NOTE — Care Management Note (Signed)
Case Management Note  Patient Details  Name: Braylea Brancato Ledwith MRN: [id number] Date of Birth: July 12, 1940  Subjective/Objective:    Discussed discharge planning with Mrs Irigoyen. Her son is bringing her portable 02 tank to her today for use during transport home. Mrs Grant selected [facility] "because I have had them in the past." A referral for HH=PT, RN, Aide was called to [place] at [facility].                 Action/Plan:   Expected Discharge Date:  05/23/17               Expected Discharge Plan:  [place]  In-House Referral:     Discharge planning Services  CM Consult  Post Acute Care Choice:    Choice offered to:  Patient  DME Arranged:    DME Agency:     HH Arranged:  RN, PT, Nurse's Aide [place] Agency:  [place]  Status of Service:  Completed, signed off  If discussed at [place] of Stay Meetings, dates discussed:    Additional Comments:  Eliud Polo A, RN 05/23/2017, 8:48 AM

## 2017-05-23 NOTE — Discharge Summary (Signed)
[facility] NAME: Hannah Faulkner    MR#:  [id number]  DATE OF BIRTH:  December 15, 1940  DATE OF ADMISSION:  05/21/2017 ADMITTING PHYSICIAN: Dustin Flock, MD  DATE OF DISCHARGE: 05/23/2017  PRIMARY CARE PHYSICIAN: Jerrol Banana., MD    ADMISSION DIAGNOSIS:  Atrial fibrillation with rapid ventricular response (HCC) [I48.91] Sepsis, due to unspecified organism ([place]) [A41.9] Pneumonia of both lungs due to infectious organism, unspecified part of lung [J18.9]  DISCHARGE DIAGNOSIS:  Active Problems:   A-fib ([place])  Sepsis ruled out SECONDARY DIAGNOSIS:   Past Medical History:  Diagnosis Date  . A-fib ([place])   . CHF (congestive heart failure) ([place])   . Hypertension   . Sleep apnea     HOSPITAL COURSE:    77 year old female with chronic diastolic heart failure with preserved ejection fraction, PAF and chronic hypoxic respiratory failure 2 L oxygen who presents from PCP office with atrial fibrillation and RVR.  1. Acute on chronic hypoxic respiratory failure in the setting of community-acquired pneumonia Oxygen has been weaned to baseline of 2 L 2. Community-acquired pneumonia: Continue Levaquin for treatment for 5 days.  3. Atrial fibrillation with RVR: Heart rate is better controlled. Continue outpatient regimen including metoprolol, diltiazem and flecainide She'll continue with Coumadin. She needs INR checked in 2 days since she is on antibiotics.  4. Chronic diastolic heart failure with preserved ejection fraction currently compensated: Continue torsemide and Demadex CHF referral upon discharge        DISCHARGE CONDITIONS AND DIET:   Stable for discharge on heart healthy diet  CONSULTS OBTAINED:  Treatment Team:  Isaias Cowman, MD  DRUG ALLERGIES:   Allergies  Allergen Reactions  . Ampicillin   . Prinzide  [Lisinopril-Hydrochlorothiazide]   . Penicillins Rash    .Has patient had a PCN reaction  causing immediate rash, facial/tongue/throat swelling, SOB or lightheadedness with hypotension: Unknown Has patient had a PCN reaction causing severe rash involving mucus membranes or skin necrosis: No Has patient had a PCN reaction that required hospitalization: No Has patient had a PCN reaction occurring within the last 10 years: No If all of the above answers are "NO", then may proceed with Cephalosporin use.     DISCHARGE MEDICATIONS:   Allergies as of 05/23/2017      Reactions   Ampicillin    Prinzide  [lisinopril-hydrochlorothiazide]    Penicillins Rash   .Has patient had a PCN reaction causing immediate rash, facial/tongue/throat swelling, SOB or lightheadedness with hypotension: Unknown Has patient had a PCN reaction causing severe rash involving mucus membranes or skin necrosis: No Has patient had a PCN reaction that required hospitalization: No Has patient had a PCN reaction occurring within the last 10 years: No If all of the above answers are "NO", then may proceed with Cephalosporin use.      Medication List    TAKE these medications   albuterol 108 (90 Base) MCG/ACT inhaler Commonly known as:  PROVENTIL HFA;VENTOLIN HFA Inhale 2 puffs into the lungs every 6 (six) hours as needed for wheezing or shortness of breath.   ALPRAZolam 0.5 MG tablet Commonly known as:  XANAX TAKE 1 TABLET BY MOUTH AT BEDTIME AS NEEDED   cyanocobalamin 1000 MCG tablet Take 1,000 mcg by mouth daily.   diltiazem 120 MG tablet Commonly known as:  CARDIZEM Take 120 mg by mouth 2 times daily at 12 noon and 4 pm.   flecainide 50 MG tablet Commonly known as:  TAMBOCOR Take 50 mg by mouth 2 (two) times daily.   gabapentin 100 MG capsule Commonly known as:  NEURONTIN TAKE 1 CAPSULE (100 MG TOTAL) BY MOUTH 3 (THREE) TIMES DAILY.   guaiFENesin 600 MG 12 hr tablet Commonly known as:  MUCINEX Take 1 tablet (600 mg total) by mouth 2 (two) times daily for 3 days.   levofloxacin 500 MG  tablet Commonly known as:  LEVAQUIN Take 1 tablet (500 mg total) by mouth daily for 5 days.   magnesium oxide 400 MG tablet Commonly known as:  MAG-OX Take 400 mg by mouth daily.   metolazone 2.5 MG tablet Commonly known as:  ZAROXOLYN Take 1 tablet (2.5 mg total) by mouth once a week.   metoprolol tartrate 100 MG tablet Commonly known as:  LOPRESSOR Take 1 tablet (100 mg total) by mouth 2 (two) times daily.   omeprazole 20 MG capsule Commonly known as:  PRILOSEC Take 1 capsule (20 mg total) by mouth at bedtime.   potassium chloride SA 20 MEQ tablet Commonly known as:  K-DUR,KLOR-CON Take 2 tablets (40 mEq total) by mouth 2 (two) times daily. And an additional 53meq ever Wednesday with metolazone   prochlorperazine 10 MG tablet Commonly known as:  COMPAZINE Take 1 tablet (10 mg total) by mouth every 8 (eight) hours as needed for nausea or vomiting.   torsemide 20 MG tablet Commonly known as:  DEMADEX Take 2 tablets (40 mg total) by mouth 2 (two) times daily.   Vitamin B6 200 MG Tabs Take 200 tablets by mouth 2 (two) times daily.   warfarin 2 MG tablet Commonly known as:  COUMADIN Take 2 mg by mouth every other day. Take 2mg  one day and take 2.5mg  next day, alternating.   warfarin 2.5 MG tablet Commonly known as:  COUMADIN Take 2.5 mg by mouth every other day. Take 2mg  one day and take 2.5mg  next day, alternating.         Today   CHIEF COMPLAINT:   Patient is doing well this morning. Reports a cough but no shortness of breath   VITAL SIGNS:  Blood pressure (!) 95/55, pulse 87, temperature 98.3 F (36.8 C), temperature source Oral, resp. rate 18, height 5\' 4"  (1.626 m), weight 76.2 kg (167 lb 14.4 oz), SpO2 95 %.   REVIEW OF SYSTEMS:  Review of Systems  Constitutional: Negative.  Negative for chills, fever and malaise/fatigue.  HENT: Negative.  Negative for ear discharge, ear pain, hearing loss, nosebleeds and sore throat.   Eyes: Negative.  Negative for  blurred vision and pain.  Respiratory: Positive for cough. Negative for hemoptysis, shortness of breath and wheezing.   Cardiovascular: Negative.  Negative for chest pain, palpitations and leg swelling.  Gastrointestinal: Negative.  Negative for abdominal pain, blood in stool, diarrhea, nausea and vomiting.  Genitourinary: Negative.  Negative for dysuria.  Musculoskeletal: Negative.  Negative for back pain.  Skin: Negative.   Neurological: Negative for dizziness, tremors, speech change, focal weakness, seizures and headaches.  Endo/Heme/Allergies: Negative.  Does not bruise/bleed easily.  Psychiatric/Behavioral: Negative.  Negative for depression, hallucinations and suicidal ideas.     PHYSICAL EXAMINATION:  GENERAL:  77 y.o.-year-old patient lying in the bed with no acute distress.  NECK:  Supple, no jugular venous distention. No thyroid enlargement, no tenderness.  LUNGS: Normal breath sounds bilaterally, no wheezing, rales,rhonchi  No use of accessory muscles of respiration.  CARDIOVASCULAR: Irregular, irregular. No murmurs, rubs, or gallops.  ABDOMEN: Soft, non-tender, non-distended. Bowel sounds  present. No organomegaly or mass.  EXTREMITIES: 1+ right greater than left which is her baseline pedal edema, no cyanosis, or clubbing.  PSYCHIATRIC: The patient is alert and oriented x 3.  SKIN: No obvious rash, lesion, or ulcer.   DATA REVIEW:   CBC Recent Labs  Lab 05/22/17 0452  WBC 10.0  HGB 11.6*  HCT 35.9  PLT 194    Chemistries  Recent Labs  Lab 05/22/17 0452  NA 137  K 3.6  CL 98*  CO2 28  GLUCOSE 135*  BUN 13  CREATININE 1.20*  CALCIUM 8.6*    Cardiac Enzymes Recent Labs  Lab 05/21/17 1320  Purcell <0.03    Microbiology Results  @MICRORSLT48 @  RADIOLOGY:  Dg Chest 1 View  Result Date: 05/23/2017 CLINICAL DATA:  Patient reports increased SOB and wheezing upon waking this morning. Hx HTN, CHF, afib. Non-smoker. EXAM: CHEST 1 VIEW COMPARISON:   05/21/2017 and older exams FINDINGS: Since the most recent prior exam, there has been worsening in lung aeration. There are new interstitial and patchy airspace opacities in the left mid and lower lung. Hazy airspace opacity has increased on the right extending to just above the minor fissure. The upper lungs remain clear. Moderate pleural effusion on the right obscures hemidiaphragm, similar to the prior study. No left pleural effusion. No pneumothorax. IMPRESSION: 1. Worsened lung aeration with increased opacities noted in the left mid to lower lung and increased opacity noted on the right. The lung opacities may be due to infection or atelectasis or a combination. No overt pulmonary edema. Persistent moderate right pleural effusion. Electronically Signed   By: Lajean Manes M.D.   On: 05/23/2017 08:04   Dg Chest 2 View  Result Date: 05/21/2017 CLINICAL DATA:  Tachycardia EXAM: CHEST  2 VIEW COMPARISON:  08/28/2016, 04/03/2016, 03/10/2016 FINDINGS: Persistent small right-sided pleural effusion with fluid along the fissure. Atelectasis or infiltrate at the right middle lobe and right base. Patchy atelectasis or infiltrate at the left lung base but with some clearing since prior study. Mild cardiomegaly with vascular congestion. No pneumothorax. Degenerative changes of the spine. IMPRESSION: 1. Small right-sided pleural effusion with atelectasis or pneumonia at the right middle lobe and right base. Patchy atelectasis or minimal infiltrate at the left base as well. 2. Cardiomegaly with mild central vascular congestion. Electronically Signed   By: Donavan Foil M.D.   On: 05/21/2017 13:56      Allergies as of 05/23/2017      Reactions   Ampicillin    Prinzide  [lisinopril-hydrochlorothiazide]    Penicillins Rash   .Has patient had a PCN reaction causing immediate rash, facial/tongue/throat swelling, SOB or lightheadedness with hypotension: Unknown Has patient had a PCN reaction causing severe rash  involving mucus membranes or skin necrosis: No Has patient had a PCN reaction that required hospitalization: No Has patient had a PCN reaction occurring within the last 10 years: No If all of the above answers are "NO", then may proceed with Cephalosporin use.      Medication List    TAKE these medications   albuterol 108 (90 Base) MCG/ACT inhaler Commonly known as:  PROVENTIL HFA;VENTOLIN HFA Inhale 2 puffs into the lungs every 6 (six) hours as needed for wheezing or shortness of breath.   ALPRAZolam 0.5 MG tablet Commonly known as:  XANAX TAKE 1 TABLET BY MOUTH AT BEDTIME AS NEEDED   cyanocobalamin 1000 MCG tablet Take 1,000 mcg by mouth daily.   diltiazem 120 MG tablet Commonly known  as:  CARDIZEM Take 120 mg by mouth 2 times daily at 12 noon and 4 pm.   flecainide 50 MG tablet Commonly known as:  TAMBOCOR Take 50 mg by mouth 2 (two) times daily.   gabapentin 100 MG capsule Commonly known as:  NEURONTIN TAKE 1 CAPSULE (100 MG TOTAL) BY MOUTH 3 (THREE) TIMES DAILY.   guaiFENesin 600 MG 12 hr tablet Commonly known as:  MUCINEX Take 1 tablet (600 mg total) by mouth 2 (two) times daily for 3 days.   levofloxacin 500 MG tablet Commonly known as:  LEVAQUIN Take 1 tablet (500 mg total) by mouth daily for 5 days.   magnesium oxide 400 MG tablet Commonly known as:  MAG-OX Take 400 mg by mouth daily.   metolazone 2.5 MG tablet Commonly known as:  ZAROXOLYN Take 1 tablet (2.5 mg total) by mouth once a week.   metoprolol tartrate 100 MG tablet Commonly known as:  LOPRESSOR Take 1 tablet (100 mg total) by mouth 2 (two) times daily.   omeprazole 20 MG capsule Commonly known as:  PRILOSEC Take 1 capsule (20 mg total) by mouth at bedtime.   potassium chloride SA 20 MEQ tablet Commonly known as:  K-DUR,KLOR-CON Take 2 tablets (40 mEq total) by mouth 2 (two) times daily. And an additional 63meq ever Wednesday with metolazone   prochlorperazine 10 MG tablet Commonly known  as:  COMPAZINE Take 1 tablet (10 mg total) by mouth every 8 (eight) hours as needed for nausea or vomiting.   torsemide 20 MG tablet Commonly known as:  DEMADEX Take 2 tablets (40 mg total) by mouth 2 (two) times daily.   Vitamin B6 200 MG Tabs Take 200 tablets by mouth 2 (two) times daily.   warfarin 2 MG tablet Commonly known as:  COUMADIN Take 2 mg by mouth every other day. Take 2mg  one day and take 2.5mg  next day, alternating.   warfarin 2.5 MG tablet Commonly known as:  COUMADIN Take 2.5 mg by mouth every other day. Take 2mg  one day and take 2.5mg  next day, alternating.          Management plans discussed with the patient and she is in agreement. Stable for discharge home with [facility]  Patient should follow up with cardiology  CODE STATUS:     Code Status Orders  (From admission, onward)        Start     Ordered   05/21/17 1636  Full code  Continuous     05/21/17 1636    Code Status History    Date Active Date Inactive Code Status Order ID Comments User Context   08/26/2016 14:11 08/31/2016 16:59 Full Code [id number]  Epifanio Lesches, MD ED   08/04/2016 22:21 08/14/2016 20:40 Full Code [id number]  Harvie Bridge, DO Inpatient   02/29/2016 20:53 03/03/2016 14:03 Full Code [id number]  Henreitta Leber, MD Inpatient      TOTAL TIME TAKING CARE OF THIS PATIENT: 38 minutes.    Note: This dictation was prepared with Dragon dictation along with smaller phrase technology. Any transcriptional errors that result from this process are unintentional.  Watt Geiler M.D on 05/23/2017 at 8:41 AM  Between 7am to 6pm - Pager - [phone] After 6pm go to [email or web address] - password [email or web address] [place] [place] [facility]  Office  [phone]  CC: Primary care physician; Jerrol Banana., MD

## 2017-05-23 NOTE — Evaluation (Signed)
Physical Therapy Evaluation Patient Details Name: Hannah Faulkner MRN: [id number] DOB: [birth year] Today's Date: 05/23/2017   History of Present Illness  Hannah Faulkner is a 77yo white female who comes to [facility] ion 2/28 from her PCP with tachycardia; upon arrival, noted CXR consistent with PNA, and patient in AF c RVR. PMH: AF, diastolic CHF, HTN, CRF, OSA, on 2LPM at home. No falls in the last 59M. Pt  performs mostly household distance AMB c intermittent SPC use.   Clinical Impression  Pt admitted with above diagnosis. Pt currently with functional limitations due to the deficits listed below (see "PT Problem List"). Upon entry, the patient is received up in chair, has recently finished breakfast, and reports she has been AMB freely to the BR with clearance from her RN. Pt in AF upon arrival, resting, HR: 105-119BPM, which increased to 117-129BPM after 6 minutes AMB. No c/o SOB, CP with activity. SpO2 remains 91-93% on 2L/min during AMB. Pt is requires intermittent UE assistance for stability in AMB, using railing in hallway this session, typically a SPC at home. She attests to feeling strength as baseline level at this time. Patient is at baseline, all education completed, and time is given to address all questions/concerns. No additional skilled PT services needed at this time, PT signing off. PT recommends daily ambulation ad lib or with nursing staff as needed to prevent deconditioning.       Follow Up Recommendations Home health PT    Equipment Recommendations  None recommended by PT    Recommendations for Other Services       Precautions / Restrictions Precautions Precautions: Fall Restrictions Weight Bearing Restrictions: No      Mobility  Bed Mobility               General bed mobility comments: received up in chair  Transfers Overall transfer level: Modified independent Equipment used: None(railing in hallway) Transfers: Sit to/from Stand Sit to Stand: Modified  independent (Device/Increase time)            Ambulation/Gait Ambulation/Gait assistance: Supervision Ambulation Distance (Feet): 360 Feet Assistive device: None(railing in hallway)   Gait velocity: 0.39ms       Stairs            Wheelchair Mobility    Modified Rankin (Stroke Patients Only)       Balance Overall balance assessment: Mild deficits observed, not formally tested;Modified Independent                                           Pertinent Vitals/Pain Pain Assessment: No/denies pain    Home Living Family/patient expects to be discharged to:: Private residence Living Arrangements: Other relatives;Children Available Help at Discharge: Family;Available PRN/intermittently Type of Home: House Home Access: Stairs to enter Entrance Stairs-Rails: Can reach both Entrance Stairs-Number of Steps: 3 Home Layout: Two level;Full bath on main level;Able to live on main level with bedroom/bathroom Home Equipment: CKasandra Knudsen- single point;Walker - 2 wheels;Bedside commode;Shower seat Additional Comments: [facility] relative who has not returned it and she is unsure if she will get it back    Prior Function Level of Independence: Independent with assistive device(s)         Comments: Mostly household distances with furniture for stability since admission in December 2017.      Hand Dominance  Extremity/Trunk Assessment                Communication   Communication: No difficulties  Cognition Arousal/Alertness: Awake/alert Behavior During Therapy: WFL for tasks assessed/performed Overall Cognitive Status: Within Functional Limits for tasks assessed                                        General Comments      Exercises     Assessment/Plan    PT Assessment All further PT needs can be met in the next venue of care  PT Problem List Cardiopulmonary status limiting  activity;Decreased mobility;Decreased balance;Decreased activity tolerance       PT Treatment Interventions      PT Goals (Current goals can be found in the Care Plan section)  Acute Rehab PT Goals PT Goal Formulation: All assessment and education complete, DC therapy    Frequency     Barriers to discharge        Co-evaluation               AM-PAC PT "6 Clicks" Daily Activity  Outcome Measure Difficulty turning over in bed (including adjusting bedclothes, sheets and blankets)?: None Difficulty moving from lying on back to sitting on the side of the bed? : None Difficulty sitting down on and standing up from a chair with arms (e.g., wheelchair, bedside commode, etc,.)?: A Little Help needed moving to and from a bed to chair (including a wheelchair)?: A Little Help needed walking in hospital room?: A Little Help needed climbing 3-5 steps with a railing? : A Little 6 Click Score: 20    End of Session Equipment Utilized During Treatment: Gait belt;Oxygen Activity Tolerance: Patient tolerated treatment well Patient left: in bed;with call bell/phone within reach Nurse Communication: (spoke to referring hospitalist directly) PT Visit Diagnosis: Muscle weakness (generalized) (M62.81);Difficulty in walking, not elsewhere classified (R26.2)    Time: 0277-4128 PT Time Calculation (min) (ACUTE ONLY): 22 min   Charges:   PT Evaluation $PT Eval Low Complexity: 1 Low PT Treatments $Therapeutic Activity: 8-22 mins   PT G Codes:        10:00 AM, 03-Jun-2017 Hannah Faulkner, PT, DPT Physical Therapist - [place] [phone] ([place])    Buccola,Allan C 06-03-17, 9:57 AM

## 2017-05-23 NOTE — Progress Notes (Signed)
[facility]  SUBJECTIVE: Patient doing better, ambulating in halls with assistance   Vitals:   05/22/17 1945 05/23/17 0429 05/23/17 0831 05/23/17 0930  BP: 97/73 (!) 93/54 (!) 95/55   Pulse: (!) 109 88 87 (!) 129  Resp: 17 18    Temp: 99.8 F (37.7 C) 99.4 F (37.4 C) 98.3 F (36.8 C)   TempSrc: Oral Oral Oral   SpO2:  93% 95% 93%  Weight:      Height:         Intake/Output Summary (Last 24 hours) at 05/23/2017 1054 Last data filed at 05/23/2017 0400 Gross per 24 hour  Intake 1112.69 ml  Output 400 ml  Net 712.69 ml      PHYSICAL EXAM  General: Well developed, well nourished, in no acute distress HEENT:  Normocephalic and atramatic Neck:  No JVD.  Lungs: Clear bilaterally to auscultation and percussion. Heart: Irregularly irregular rhythm. Normal S1 and S2 without gallops or murmurs.  Abdomen: Bowel sounds are positive, abdomen soft and non-tender  Msk:  Back normal, normal gait. Normal strength and tone for age. Extremities: No clubbing, cyanosis or edema.   Neuro: Alert and oriented X 3. Psych:  Good affect, responds appropriately   LABS: Basic Metabolic Panel: Recent Labs    05/21/17 1320 05/22/17 0452  NA 140 137  K 3.3* 3.6  CL 97* 98*  CO2 32 28  GLUCOSE 106* 135*  BUN 10 13  CREATININE 0.88 1.20*  CALCIUM 9.0 8.6*   Liver Function Tests: No results for input(s): AST, ALT, ALKPHOS, BILITOT, PROT, ALBUMIN in the last 72 hours. No results for input(s): LIPASE, AMYLASE in the last 72 hours. CBC: Recent Labs    05/21/17 1320 05/22/17 0452  WBC 6.8 10.0  HGB 12.0 11.6*  HCT 36.1 35.9  MCV 85.1 86.4  PLT 174 194   Cardiac Enzymes: Recent Labs    05/21/17 1320  TROPONINI <0.03   BNP: Invalid input(s): POCBNP D-Dimer: No results for input(s): DDIMER in the last 72 hours. Hemoglobin A1C: No results for input(s): HGBA1C in the last 72 hours. Fasting Lipid Panel: No results for input(s): CHOL, HDL, LDLCALC, TRIG, CHOLHDL, LDLDIRECT in  the last 72 hours. Thyroid Function Tests: No results for input(s): TSH, T4TOTAL, T3FREE, THYROIDAB in the last 72 hours.  Invalid input(s): FREET3 Anemia Panel: No results for input(s): VITAMINB12, FOLATE, FERRITIN, TIBC, IRON, RETICCTPCT in the last 72 hours.  Dg Chest 1 View  Result Date: 05/23/2017 CLINICAL DATA:  Patient reports increased SOB and wheezing upon waking this morning. Hx HTN, CHF, afib. Non-smoker. EXAM: CHEST 1 VIEW COMPARISON:  05/21/2017 and older exams FINDINGS: Since the most recent prior exam, there has been worsening in lung aeration. There are new interstitial and patchy airspace opacities in the left mid and lower lung. Hazy airspace opacity has increased on the right extending to just above the minor fissure. The upper lungs remain clear. Moderate pleural effusion on the right obscures hemidiaphragm, similar to the prior study. No left pleural effusion. No pneumothorax. IMPRESSION: 1. Worsened lung aeration with increased opacities noted in the left mid to lower lung and increased opacity noted on the right. The lung opacities may be due to infection or atelectasis or a combination. No overt pulmonary edema. Persistent moderate right pleural effusion. Electronically Signed   By: Lajean Manes M.D.   On: 05/23/2017 08:04   Dg Chest 2 View  Result Date: 05/21/2017 CLINICAL DATA:  Tachycardia EXAM: CHEST  2 VIEW COMPARISON:  08/28/2016, 04/03/2016, 03/10/2016 FINDINGS: Persistent small right-sided pleural effusion with fluid along the fissure. Atelectasis or infiltrate at the right middle lobe and right base. Patchy atelectasis or infiltrate at the left lung base but with some clearing since prior study. Mild cardiomegaly with vascular congestion. No pneumothorax. Degenerative changes of the spine. IMPRESSION: 1. Small right-sided pleural effusion with atelectasis or pneumonia at the right middle lobe and right base. Patchy atelectasis or minimal infiltrate at the left base as  well. 2. Cardiomegaly with mild central vascular congestion. Electronically Signed   By: Donavan Foil M.D.   On: 05/21/2017 13:56     Echo  TELEMETRY: Atrial fibrillation 100-110 bpm:  ASSESSMENT AND PLAN:  Active Problems:   A-fib ([place])    1.  Atrial fibrillation with rapid ventricular rate, in the setting of pneumonia, rate stabilized on current medications 2.  Chronic diastolic congestive heart failure 3.  Right middle lobe pneumonia  Recommendations  1.  Continue current overall therapy 2.  Continue warfarin for stroke prevention, target INR 2.0-3.9 3.  Continue flecainide for rhythm control 4.  Continue metoprolol for rate control 5.  Follow-up as outpatient next week   Isaias Cowman, MD, PhD, [facility] 05/23/2017 10:54 AM

## 2017-05-23 NOTE — Progress Notes (Signed)
Patient BP soft this morning. Spoke with Dr. Saralyn Pilar about medications that patient is on that would affect her BP. Stated to go ahead and give everything and only give her a half dose of the metoprolol. She is scheduled to get 100 MG of Metoprolol so we will give her 50 mg. Patient understands. Will continue to monitor.  Vitals:   05/23/17 0831 05/23/17 0930  BP: (!) 95/55   Pulse: 87 (!) 129  Resp:    Temp: 98.3 F (36.8 C)   SpO2: 95% 93%

## 2017-05-23 NOTE — Progress Notes (Signed)
Patient education for new medications completed.  Verbalized understanding.  Tele box removed, off, and returned.  Discharged via wheelchair to a private vehicle.

## 2017-05-25 ENCOUNTER — Telehealth: Payer: Self-pay | Admitting: Family Medicine

## 2017-05-25 DIAGNOSIS — I480 Paroxysmal atrial fibrillation: Secondary | ICD-10-CM | POA: Diagnosis not present

## 2017-05-25 DIAGNOSIS — I5032 Chronic diastolic (congestive) heart failure: Secondary | ICD-10-CM | POA: Diagnosis not present

## 2017-05-25 DIAGNOSIS — J9621 Acute and chronic respiratory failure with hypoxia: Secondary | ICD-10-CM | POA: Diagnosis not present

## 2017-05-25 DIAGNOSIS — Z7901 Long term (current) use of anticoagulants: Secondary | ICD-10-CM | POA: Diagnosis not present

## 2017-05-25 DIAGNOSIS — J189 Pneumonia, unspecified organism: Secondary | ICD-10-CM | POA: Diagnosis not present

## 2017-05-25 DIAGNOSIS — I110 Hypertensive heart disease with heart failure: Secondary | ICD-10-CM | POA: Diagnosis not present

## 2017-05-25 NOTE — Telephone Encounter (Signed)
Please advise. Thanks.  

## 2017-05-25 NOTE — Telephone Encounter (Signed)
Pt is needing verbal approval for plan of care 2 time a week for 1 week  1 time a week for 3 weeks for CHG and pneumonia mgmt.

## 2017-05-26 LAB — CULTURE, BLOOD (ROUTINE X 2)
CULTURE: NO GROWTH
CULTURE: NO GROWTH

## 2017-05-26 NOTE — Telephone Encounter (Signed)
[name] advised via VM.

## 2017-05-26 NOTE — Telephone Encounter (Signed)
ok 

## 2017-05-29 ENCOUNTER — Telehealth: Payer: Self-pay

## 2017-05-29 ENCOUNTER — Ambulatory Visit: Payer: Medicare Other | Admitting: Family

## 2017-05-29 NOTE — Telephone Encounter (Signed)
Please advise 

## 2017-05-29 NOTE — Telephone Encounter (Signed)
Need information, what are symptoms. Is she having trouble breathing? is she having fever?  Can see Mikki Santee  In the morning if she is not sick enough to require ER visit.

## 2017-05-29 NOTE — Telephone Encounter (Signed)
Pt was seen at ER on 05/21/2017 for pneumonia. She was prescribed Levaquin, and is no better. Is asking if she needs a refill. Also would like to know if she can continue Mucinex. [facility]. Mikeal Hawthorne.

## 2017-05-29 NOTE — Telephone Encounter (Signed)
Patient stated she has no fever or sob. Patient states she has chills and a productive cough. Advised pt that Mikki Santee will be able to see her in the morning. Patient stated she will try to get a ride to the office.

## 2017-05-30 ENCOUNTER — Encounter: Payer: Self-pay | Admitting: Family Medicine

## 2017-05-30 ENCOUNTER — Ambulatory Visit (INDEPENDENT_AMBULATORY_CARE_PROVIDER_SITE_OTHER): Payer: Medicare Other | Admitting: Family Medicine

## 2017-05-30 VITALS — BP 102/60 | HR 128 | Temp 100.3°F | Resp 18

## 2017-05-30 DIAGNOSIS — I482 Chronic atrial fibrillation, unspecified: Secondary | ICD-10-CM

## 2017-05-30 DIAGNOSIS — J189 Pneumonia, unspecified organism: Secondary | ICD-10-CM

## 2017-05-30 DIAGNOSIS — I5032 Chronic diastolic (congestive) heart failure: Secondary | ICD-10-CM | POA: Diagnosis not present

## 2017-05-30 MED ORDER — LEVOFLOXACIN 500 MG PO TABS: 500.0000 mg | ORAL_TABLET | Freq: Every day | ORAL | 0 refills | Status: DC

## 2017-05-30 NOTE — Progress Notes (Signed)
Patient: Hannah Faulkner Female    DOB: [birth year]   77 y.o.   MRN: [id number] Visit Date: 05/30/2017  Today's Provider: Vernie Murders, PA   Chief Complaint  Patient presents with  . Cough    x 2 weeks   Subjective:    Cough  This is a recurrent problem. The current episode started 1 to 4 weeks ago. The problem has been unchanged. The cough is productive of sputum (yellow colored sputum). Associated symptoms include chills, a fever (low grade last night of 99.7) and wheezing. Pertinent negatives include no headaches, hemoptysis, myalgias, sore throat or sweats. Treatments tried: Mucinex and Levaquin. The treatment provided mild relief.   Past Medical History:  Diagnosis Date  . A-fib ([place])   . CHF (congestive heart failure) ([place])   . Hypertension   . Sleep apnea    Patient Active Problem List   Diagnosis Date Noted  . A-fib ([place]) 05/21/2017  . Hypokalemia 11/20/2016  . Lymphedema of both lower extremities 11/20/2016  . Peripheral edema 11/20/2016  . Chronic diastolic heart failure ([place]) 09/04/2016  . Restless legs 09/04/2016  . Fatigue 09/04/2016  . Sterile pyuria 08/31/2016  . Hyperglycemia 08/31/2016  . Acute diastolic heart failure ([place]) 08/26/2016  . Community acquired pneumonia 08/04/2016  . Tricuspid regurgitation 05/15/2015  . Mitral valve regurgitation 05/15/2015  . Anxiety 08/25/2014  . Atrial fibrillation, chronic ([place]) 08/25/2014  . Benign neoplasm of colon 08/25/2014  . 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 Diabetic neuropathy ([place]) 08/25/2014  . Essential hypertension 08/25/2014  . 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 Adiposity 08/25/2014  . Obstructive apnea 08/25/2014  . Hypercholesterolemia without hypertriglyceridemia 08/25/2014  . B12 deficiency 08/25/2014  . H/O cardiac catheterization 07/15/2013  . Apnea, sleep 07/15/2013  . AF (paroxysmal atrial fibrillation) ([place])  07/15/2013   Past Surgical History:  Procedure Laterality Date  . EYE SURGERY    . POLYPECTOMY     colon poly premoved   Family History  Problem Relation Age of Onset  . Hypertension Mother   . Heart disease Mother   . CVA Mother   . Heart attack Mother   . Lung cancer Father   . Heart attack Sister   . CVA Sister   . Thyroid disease Sister   . Liver cancer Brother   . Thyroid disease Sister   . Thyroid disease Sister   . Diabetes Sister   . Dementia Sister   . Atrial fibrillation Sister    Allergies  Allergen Reactions  . Ampicillin   . Prinzide  [Lisinopril-Hydrochlorothiazide]   . Penicillins Rash    .Has patient had a PCN reaction causing immediate rash, facial/tongue/throat swelling, SOB or lightheadedness with hypotension: Unknown Has patient had a PCN reaction causing severe rash involving mucus membranes or skin necrosis: No Has patient had a PCN reaction that required hospitalization: No Has patient had a PCN reaction occurring within the last 10 years: No If all of the above answers are "NO", then may proceed with Cephalosporin use.     Current Outpatient Medications:  .  albuterol (PROVENTIL HFA;VENTOLIN HFA) 108 (90 Base) MCG/ACT inhaler, Inhale 2 puffs into the lungs every 6 (six) hours as needed for wheezing or shortness of breath., Disp: , Rfl:  .  ALPRAZolam (XANAX) 0.5 MG tablet, TAKE 1 TABLET BY MOUTH AT BEDTIME AS NEEDED, Disp: 30 tablet, Rfl: 1 .  cyanocobalamin 1000 MCG tablet,  Take 1,000 mcg by mouth daily., Disp: , Rfl:  .  diltiazem (CARDIZEM) 120 MG tablet, Take 120 mg by mouth 2 times daily at 12 noon and 4 pm. , Disp: , Rfl:  .  flecainide (TAMBOCOR) 50 MG tablet, Take 50 mg by mouth 2 (two) times daily. , Disp: , Rfl:  .  gabapentin (NEURONTIN) 100 MG capsule, TAKE 1 CAPSULE (100 MG TOTAL) BY MOUTH 3 (THREE) TIMES DAILY., Disp: 270 capsule, Rfl: 2 .  magnesium oxide (MAG-OX) 400 MG tablet, Take 400 mg by mouth daily., Disp: , Rfl:  .   metolazone (ZAROXOLYN) 2.5 MG tablet, Take 1 tablet (2.5 mg total) by mouth once a week., Disp: 4 tablet, Rfl: 6 .  metoprolol tartrate (LOPRESSOR) 100 MG tablet, Take 1 tablet (100 mg total) by mouth 2 (two) times daily., Disp: 60 tablet, Rfl: 3 .  omeprazole (PRILOSEC) 20 MG capsule, Take 1 capsule (20 mg total) by mouth at bedtime., Disp: 30 capsule, Rfl: 11 .  potassium chloride SA (K-DUR,KLOR-CON) 20 MEQ tablet, Take 2 tablets (40 mEq total) by mouth 2 (two) times daily. And an additional 65meq ever Wednesday with metolazone, Disp: 124 tablet, Rfl: 5 .  prochlorperazine (COMPAZINE) 10 MG tablet, Take 1 tablet (10 mg total) by mouth every 8 (eight) hours as needed for nausea or vomiting., Disp: 30 tablet, Rfl: 1 .  Pyridoxine HCl (VITAMIN B6) 200 MG TABS, Take 200 tablets by mouth 2 (two) times daily. , Disp: , Rfl:  .  torsemide (DEMADEX) 20 MG tablet, Take 2 tablets (40 mg total) by mouth 2 (two) times daily., Disp: 120 tablet, Rfl: 5 .  warfarin (COUMADIN) 2 MG tablet, Take 2 mg by mouth every other day. Take 2mg  one day and take 2.5mg  next day, alternating., Disp: , Rfl:  .  warfarin (COUMADIN) 2.5 MG tablet, Take 2.5 mg by mouth every other day. Take 2mg  one day and take 2.5mg  next day, alternating., Disp: , Rfl:   Review of Systems  Constitutional: Positive for chills and fever (low grade last night of 99.7).  HENT: Negative for sore throat.   Respiratory: Positive for cough and wheezing. Negative for hemoptysis.   Musculoskeletal: Negative for myalgias.  Neurological: Negative for headaches.   Social History   Tobacco Use  . Smoking status: Never Smoker  . Smokeless tobacco: Never Used  Substance Use Topics  . Alcohol use: No   Objective:   BP 102/60 (BP Location: Right Arm, Patient Position: Sitting, Cuff Size: Large)   Pulse (!) 128   Temp 100.3 F (37.9 C) (Oral)   Resp 18   SpO2 95% Comment: 2 L 02 nasal canula Wt Readings from Last 3 Encounters:  05/21/17 167 lb 14.4  oz (76.2 kg)  05/15/17 165 lb 4 oz (75 kg)  04/15/17 171 lb (77.6 kg)    Physical Exam  Constitutional: She is oriented to person, place, and time. She appears well-developed and well-nourished. No distress.  HENT:  Head: Normocephalic and atraumatic.  Right Ear: Hearing and external ear normal.  Left Ear: Hearing and external ear normal.  Nose: Nose normal.  Mouth/Throat: Oropharynx is clear and moist.  Eyes: Conjunctivae and lids are normal. Right eye exhibits no discharge. Left eye exhibits no discharge. No scleral icterus.  Neck: Neck supple.  Cardiovascular:  Tachycardia.  Pulmonary/Chest: Effort normal. No respiratory distress.  Few rales and rhonchi.  Abdominal: Soft. Bowel sounds are normal.  Musculoskeletal: She exhibits edema.  2-3+ pitting edema mid calf  down (R>L).  Neurological: She is alert and oriented to person, place, and time.  Skin: Skin is intact. No lesion and no rash noted.  Psychiatric: She has a normal mood and affect. Her speech is normal and behavior is normal. Thought content normal.      Assessment & Plan:     1. Community acquired pneumonia, unspecified laterality Has had cough and congestion with diagnosis of pneumonia during hospitalization on 05-21-17 (discharged 05-23-17). Still having purulent sputum and using oxygen by nasal cannula at 2LPM today. Has Albuterol-HFA inhaler but not using it today. Temperature spiked to 99.7 last night with some wheezing. Finished the Levaquin, given at discharge, on 05-28-17. Few rales, temperature 100.3 and tachycardic today. May use Mucinex-DM and refilled the Levaquin. Should recheck in 5-7 days if no better. Has an appointment for follow up with Dr. Rosanna Randy on 06-11-17. - levofloxacin (LEVAQUIN) 500 MG tablet; Take 1 tablet (500 mg total) by mouth daily.  Dispense: 7 tablet; Refill: 0  2. Chronic diastolic heart failure (HCC) Pitting edema of both lower legs (R>L). Has not taken diuretics or heart medications today,  yet. Must limit salt intake and take medications today. May need a extra dose of Zaroxolyn 2.5 mg tomorrow morning if edema no better. Keep the appointment she has with Dr. Saralyn Pilar (cardiologist) first of next week. Should go to ER if dyspnea worse after use of diuretic.  3. Atrial fibrillation, chronic ([place]) Tachycardic today with pulse 123. Denies chest pains and feels heartbeat is normal for her. Continue Warfarin as prescribed by Dr. Saralyn Pilar and will check protime with INR first of the week.        Vernie Murders, PA  [facility]

## 2017-06-01 DIAGNOSIS — G4733 Obstructive sleep apnea (adult) (pediatric): Secondary | ICD-10-CM | POA: Diagnosis not present

## 2017-06-01 DIAGNOSIS — I10 Essential (primary) hypertension: Secondary | ICD-10-CM | POA: Diagnosis not present

## 2017-06-01 DIAGNOSIS — I480 Paroxysmal atrial fibrillation: Secondary | ICD-10-CM | POA: Diagnosis not present

## 2017-06-01 DIAGNOSIS — G4730 Sleep apnea, unspecified: Secondary | ICD-10-CM | POA: Diagnosis not present

## 2017-06-01 DIAGNOSIS — Z79899 Other long term (current) drug therapy: Secondary | ICD-10-CM | POA: Diagnosis not present

## 2017-06-03 DIAGNOSIS — I5032 Chronic diastolic (congestive) heart failure: Secondary | ICD-10-CM | POA: Diagnosis not present

## 2017-06-03 DIAGNOSIS — Z7901 Long term (current) use of anticoagulants: Secondary | ICD-10-CM | POA: Diagnosis not present

## 2017-06-03 DIAGNOSIS — J9621 Acute and chronic respiratory failure with hypoxia: Secondary | ICD-10-CM | POA: Diagnosis not present

## 2017-06-03 DIAGNOSIS — I110 Hypertensive heart disease with heart failure: Secondary | ICD-10-CM | POA: Diagnosis not present

## 2017-06-03 DIAGNOSIS — J189 Pneumonia, unspecified organism: Secondary | ICD-10-CM | POA: Diagnosis not present

## 2017-06-03 DIAGNOSIS — I480 Paroxysmal atrial fibrillation: Secondary | ICD-10-CM | POA: Diagnosis not present

## 2017-06-10 DIAGNOSIS — J9621 Acute and chronic respiratory failure with hypoxia: Secondary | ICD-10-CM | POA: Diagnosis not present

## 2017-06-10 DIAGNOSIS — J189 Pneumonia, unspecified organism: Secondary | ICD-10-CM | POA: Diagnosis not present

## 2017-06-10 DIAGNOSIS — I5032 Chronic diastolic (congestive) heart failure: Secondary | ICD-10-CM | POA: Diagnosis not present

## 2017-06-10 DIAGNOSIS — Z7901 Long term (current) use of anticoagulants: Secondary | ICD-10-CM | POA: Diagnosis not present

## 2017-06-10 DIAGNOSIS — I110 Hypertensive heart disease with heart failure: Secondary | ICD-10-CM | POA: Diagnosis not present

## 2017-06-10 DIAGNOSIS — I480 Paroxysmal atrial fibrillation: Secondary | ICD-10-CM | POA: Diagnosis not present

## 2017-06-11 ENCOUNTER — Ambulatory Visit (INDEPENDENT_AMBULATORY_CARE_PROVIDER_SITE_OTHER): Payer: Medicare Other | Admitting: Family Medicine

## 2017-06-11 VITALS — BP 108/60 | HR 138 | Temp 97.9°F | Resp 18

## 2017-06-11 DIAGNOSIS — I482 Chronic atrial fibrillation, unspecified: Secondary | ICD-10-CM

## 2017-06-11 DIAGNOSIS — I5032 Chronic diastolic (congestive) heart failure: Secondary | ICD-10-CM

## 2017-06-11 DIAGNOSIS — Z09 Encounter for follow-up examination after completed treatment for conditions other than malignant neoplasm: Secondary | ICD-10-CM

## 2017-06-11 DIAGNOSIS — I10 Essential (primary) hypertension: Secondary | ICD-10-CM

## 2017-06-11 DIAGNOSIS — J189 Pneumonia, unspecified organism: Secondary | ICD-10-CM | POA: Diagnosis not present

## 2017-06-11 MED ORDER — DOXYCYCLINE HYCLATE 100 MG PO TABS: 100.0000 mg | ORAL_TABLET | Freq: Two times a day (BID) | ORAL | 0 refills | Status: DC

## 2017-06-11 NOTE — Progress Notes (Signed)
Patient: Hannah Faulkner Female    DOB: 04-13-1940   77 y.o.   MRN: [id number] Visit Date: 06/11/2017  Today's Provider: Wilhemena Durie, MD   Chief Complaint  Patient presents with  . Hospitalization Follow-up   Subjective:    HPI Hsp stay 05/21/17-05/23/17  Admission dx: Atrial fib with rapid ventricular response, sepsis, pneumonia of both lungs  D/c'd dx: a fib Sepsis ruled out.  Pt was treated with Levaquin for 5 days.   Pt has since been since by [facility] for pneumonia on 05/30/17. She was still having purulent sputum and using oxygen and her temp had spiked 99.7 and had some wheezing. He refilled her Levaquin and told to follow up in 5-7 day if no better and keep follow up for today.   Pt reports that she is still not feeling well. She is weak and coughing. She is constipated. She reports that she has not a good BM in 3-4 days. She reports that she does not have an appetite and is not eating as much.  Also her heart rate is elevated today but she says she has not taken any of her medications today because she has not felt like taking them. Her heart rate was also elevated when she was in on 05/30/17 to see Simona Huh.       Allergies  Allergen Reactions  . Ampicillin   . Prinzide  [Lisinopril-Hydrochlorothiazide]   . Penicillins Rash    .Has patient had a PCN reaction causing immediate rash, facial/tongue/throat swelling, SOB or lightheadedness with hypotension: Unknown Has patient had a PCN reaction causing severe rash involving mucus membranes or skin necrosis: No Has patient had a PCN reaction that required hospitalization: No Has patient had a PCN reaction occurring within the last 10 years: No If all of the above answers are "NO", then may proceed with Cephalosporin use.      Current Outpatient Medications:  .  ALPRAZolam (XANAX) 0.5 MG tablet, TAKE 1 TABLET BY MOUTH AT BEDTIME AS NEEDED, Disp: 30 tablet, Rfl: 1 .  cyanocobalamin 1000 MCG tablet, Take 1,000 mcg  by mouth daily., Disp: , Rfl:  .  diltiazem (CARDIZEM) 120 MG tablet, Take 120 mg by mouth 2 times daily at 12 noon and 4 pm. , Disp: , Rfl:  .  flecainide (TAMBOCOR) 50 MG tablet, Take 50 mg by mouth 2 (two) times daily. , Disp: , Rfl:  .  gabapentin (NEURONTIN) 100 MG capsule, TAKE 1 CAPSULE (100 MG TOTAL) BY MOUTH 3 (THREE) TIMES DAILY., Disp: 270 capsule, Rfl: 2 .  magnesium oxide (MAG-OX) 400 MG tablet, Take 400 mg by mouth daily., Disp: , Rfl:  .  metolazone (ZAROXOLYN) 2.5 MG tablet, Take 1 tablet (2.5 mg total) by mouth once a week., Disp: 4 tablet, Rfl: 6 .  metoprolol tartrate (LOPRESSOR) 100 MG tablet, Take 1 tablet (100 mg total) by mouth 2 (two) times daily., Disp: 60 tablet, Rfl: 3 .  omeprazole (PRILOSEC) 20 MG capsule, Take 1 capsule (20 mg total) by mouth at bedtime., Disp: 30 capsule, Rfl: 11 .  potassium chloride SA (K-DUR,KLOR-CON) 20 MEQ tablet, Take 2 tablets (40 mEq total) by mouth 2 (two) times daily. And an additional 91meq ever Wednesday with metolazone, Disp: 124 tablet, Rfl: 5 .  prochlorperazine (COMPAZINE) 10 MG tablet, Take 1 tablet (10 mg total) by mouth every 8 (eight) hours as needed for nausea or vomiting., Disp: 30 tablet, Rfl: 1 .  Pyridoxine HCl (VITAMIN  B6) 200 MG TABS, Take 200 tablets by mouth 2 (two) times daily. , Disp: , Rfl:  .  torsemide (DEMADEX) 20 MG tablet, Take 2 tablets (40 mg total) by mouth 2 (two) times daily., Disp: 120 tablet, Rfl: 5 .  warfarin (COUMADIN) 2 MG tablet, Take 2 mg by mouth every other day. Take 2mg  one day and take 2.5mg  next day, alternating., Disp: , Rfl:  .  warfarin (COUMADIN) 2.5 MG tablet, Take 2.5 mg by mouth every other day. Take 2mg  one day and take 2.5mg  next day, alternating., Disp: , Rfl:  .  albuterol (PROVENTIL HFA;VENTOLIN HFA) 108 (90 Base) MCG/ACT inhaler, Inhale 2 puffs into the lungs every 6 (six) hours as needed for wheezing or shortness of breath., Disp: , Rfl:  .  levofloxacin (LEVAQUIN) 500 MG tablet, Take 1  tablet (500 mg total) by mouth daily. (Patient not taking: Reported on 06/11/2017), Disp: 7 tablet, Rfl: 0  Review of Systems  Constitutional: Positive for fatigue.  HENT: Negative.   Eyes: Negative.   Respiratory: Negative.   Cardiovascular: Negative.   Gastrointestinal: Positive for constipation.  Endocrine: Negative.   Genitourinary: Negative.   Musculoskeletal: Negative.   Skin: Negative.   Allergic/Immunologic: Negative.   Neurological: Positive for weakness.  Hematological: Negative.   Psychiatric/Behavioral: Negative.     Social History   Tobacco Use  . Smoking status: Never Smoker  . Smokeless tobacco: Never Used  Substance Use Topics  . Alcohol use: No   Objective:   BP 108/60 (BP Location: Left Arm, Patient Position: Sitting, Cuff Size: Normal)   Pulse (!) 138   Temp 97.9 F (36.6 C) (Oral)   Resp 18   SpO2 96%  Vitals:   06/11/17 1348  BP: 108/60  Pulse: (!) 138  Resp: 18  Temp: 97.9 F (36.6 C)  TempSrc: Oral  SpO2: 96%     Physical Exam  Constitutional: She is oriented to person, place, and time. She appears well-developed and well-nourished.  HENT:  Head: Normocephalic and atraumatic.  Right Ear: External ear normal.  Left Ear: External ear normal.  Nose: Nose normal.  Mouth/Throat: Oropharynx is clear and moist.  Eyes: Pupils are equal, round, and reactive to light. Conjunctivae and EOM are normal.  Neck: Normal range of motion. Neck supple.  Cardiovascular: Normal rate, regular rhythm, normal heart sounds and intact distal pulses.  Pulmonary/Chest: Effort normal and breath sounds normal.  Decreased breath sounds both bases.   Abdominal: Soft.  Musculoskeletal: Normal range of motion.  Neurological: She is alert and oriented to person, place, and time. She has normal reflexes.  Skin: Skin is warm and dry.  Psychiatric: She has a normal mood and affect. Her behavior is normal. Judgment and thought content normal.        Assessment &  Plan:     1. Hospital discharge follow-up Slowly getting her strength back.I hope she can feel better every few days.  2. Community acquired pneumonia, unspecified laterality F/u CXR ordered to make sure this has not worsened--clinically she is stable.Doxy if she worsens. - DG Chest 2 View; Future  3. Chronic diastolic heart failure ([place])   4. Atrial fibrillation, chronic (HCC) HR about 100-120 today--pt told to take Toprol/diltiazem daily.PT next week if she take doxy. 5.Dehydration Hold diuretics tomorrow. Encourage fluids. 6.Fatigue More than 25 minutes spent on visit,more than 50% in counseling.    HPI, Exam, and A&P Transcribed under the direction and in the presence of Richard L. Cranford Mon,  MD  Electronically Signed: Katina Dung, CMA  I have done the exam and reviewed the above chart and it is accurate to the best of my knowledge. Development worker, community has been used in this note in any air is in the dictation or transcription are unintentional.  Wilhemena Durie, MD  [place]

## 2017-06-11 NOTE — Patient Instructions (Addendum)
Please take your Metoprolol for your heart rate. Push fluids today and take no fluid pills (Torsemide) tomorrow and take 3 fluids pills (Torsemide) on Saturday. Then resume normal dosing on Sunday. If you get worse get the antibiotic filled.

## 2017-06-14 DIAGNOSIS — I5032 Chronic diastolic (congestive) heart failure: Secondary | ICD-10-CM | POA: Diagnosis not present

## 2017-06-15 ENCOUNTER — Ambulatory Visit
Admission: RE | Admit: 2017-06-15 | Discharge: 2017-06-15 | Disposition: A | Discharge status: Home / Self Care | Payer: Medicare Other | Source: Ambulatory Visit | Attending: Family Medicine | Admitting: Family Medicine

## 2017-06-15 DIAGNOSIS — J189 Pneumonia, unspecified organism: Secondary | ICD-10-CM | POA: Diagnosis not present

## 2017-06-15 DIAGNOSIS — I517 Cardiomegaly: Secondary | ICD-10-CM | POA: Diagnosis not present

## 2017-06-15 DIAGNOSIS — R05 Cough: Secondary | ICD-10-CM | POA: Diagnosis not present

## 2017-06-16 ENCOUNTER — Ambulatory Visit (INDEPENDENT_AMBULATORY_CARE_PROVIDER_SITE_OTHER): Payer: Medicare Other | Admitting: Family Medicine

## 2017-06-16 ENCOUNTER — Telehealth: Payer: Self-pay

## 2017-06-16 ENCOUNTER — Other Ambulatory Visit: Payer: Self-pay

## 2017-06-16 VITALS — BP 124/66 | HR 98 | Temp 97.7°F | Resp 18

## 2017-06-16 DIAGNOSIS — K5900 Constipation, unspecified: Secondary | ICD-10-CM

## 2017-06-16 DIAGNOSIS — J189 Pneumonia, unspecified organism: Secondary | ICD-10-CM | POA: Diagnosis not present

## 2017-06-16 DIAGNOSIS — I480 Paroxysmal atrial fibrillation: Secondary | ICD-10-CM | POA: Diagnosis not present

## 2017-06-16 DIAGNOSIS — I5032 Chronic diastolic (congestive) heart failure: Secondary | ICD-10-CM | POA: Diagnosis not present

## 2017-06-16 DIAGNOSIS — K648 Other hemorrhoids: Secondary | ICD-10-CM

## 2017-06-16 DIAGNOSIS — I110 Hypertensive heart disease with heart failure: Secondary | ICD-10-CM | POA: Diagnosis not present

## 2017-06-16 DIAGNOSIS — R39198 Other difficulties with micturition: Secondary | ICD-10-CM | POA: Diagnosis not present

## 2017-06-16 DIAGNOSIS — J9621 Acute and chronic respiratory failure with hypoxia: Secondary | ICD-10-CM | POA: Diagnosis not present

## 2017-06-16 DIAGNOSIS — Z7901 Long term (current) use of anticoagulants: Secondary | ICD-10-CM | POA: Diagnosis not present

## 2017-06-16 MED ORDER — SENNOSIDES-DOCUSATE SODIUM 8.6-50 MG PO TABS: 1.0000 | ORAL_TABLET | Freq: Every day | ORAL | 12 refills | Status: AC

## 2017-06-16 MED ORDER — HYDROCORTISONE ACETATE 25 MG RE SUPP: 25.0000 mg | Freq: Two times a day (BID) | RECTAL | 0 refills | Status: DC

## 2017-06-16 MED ORDER — POLYETHYLENE GLYCOL 3350 17 GM/SCOOP PO POWD: 17.0000 g | Freq: Two times a day (BID) | ORAL | 1 refills | Status: AC | PRN

## 2017-06-16 NOTE — Patient Instructions (Addendum)
Increase fluids  Sitz bath at least 3 times daily

## 2017-06-16 NOTE — Telephone Encounter (Signed)
-----   Message from Jerrol Banana., MD sent at 06/16/2017  8:31 AM EDT ----- CXR clear.

## 2017-06-16 NOTE — Progress Notes (Signed)
Hannah Faulkner Bureau  MRN: [id number] DOB: February 28, 1941  Subjective:  HPI   The patient is a 77 year old female who presents for evaluation of constipation and hemorrhoids.  The patient states she has been constipated for some time now but it got worse starting about a week ago.  She has been having to strain hard to have a movement and she thinks this has caused her to develop hemorrhoids.  She states she has taken stool softener for 2 days and MOM yesterday.  She has not had any good results.  She has described the stool she has passed as small hard balls.   She denies any blood in her stool.  Patient Active Problem List   Diagnosis Date Noted  . A-fib ([place]) 05/21/2017  . Hypokalemia 11/20/2016  . Lymphedema of both lower extremities 11/20/2016  . Peripheral edema 11/20/2016  . Chronic diastolic heart failure ([place]) 09/04/2016  . Restless legs 09/04/2016  . Fatigue 09/04/2016  . Sterile pyuria 08/31/2016  . Hyperglycemia 08/31/2016  . Acute diastolic heart failure ([place]) 08/26/2016  . Community acquired pneumonia 08/04/2016  . Tricuspid regurgitation 05/15/2015  . Mitral valve regurgitation 05/15/2015  . Anxiety 08/25/2014  . Atrial fibrillation, chronic ([place]) 08/25/2014  . Benign neoplasm of colon 08/25/2014  . 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 Diabetic neuropathy ([place]) 08/25/2014  . Essential hypertension 08/25/2014  . 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 Adiposity 08/25/2014  . Obstructive apnea 08/25/2014  . Hypercholesterolemia without hypertriglyceridemia 08/25/2014  . B12 deficiency 08/25/2014  . H/O cardiac catheterization 07/15/2013  . Apnea, sleep 07/15/2013  . AF (paroxysmal atrial fibrillation) ([place]) 07/15/2013    Past Medical History:  Diagnosis Date  . A-fib ([place])   . CHF (congestive heart failure) ([place])   . Hypertension   . Sleep apnea     Social History   Socioeconomic History  .  Marital status: Widowed    Spouse name: Not on file  . Number of children: 3  . Years of education: HS  . Highest education level: 12th grade  Occupational History    Employer: RETIRED  Social Needs  . Financial resource strain: Not hard at all  . Food insecurity:    Worry: Never true    Inability: Never true  . Transportation needs:    Medical: Yes    Non-medical: Yes  Tobacco Use  . Smoking status: Never Smoker  . Smokeless tobacco: Never Used  Substance and Sexual Activity  . Alcohol use: No  . Drug use: No  . Sexual activity: Not on file  Lifestyle  . Physical activity:    Days per week: 0 days    Minutes per session: 0 min  . Stress: Only a little  Relationships  . Social connections:    Talks on phone: More than three times a week    Gets together: More than three times a week    Attends religious service: More than 4 times per year    Active member of club or organization: No    Attends meetings of clubs or organizations: Never    Relationship status: Widowed  . Intimate partner violence:    Fear of current or ex partner: Patient refused    Emotionally abused: Patient refused    Physically abused: Patient refused    Forced sexual activity: Patient refused  Other Topics Concern  . Not on file  Social History Narrative  . Not  on file    Outpatient Encounter Medications as of 06/16/2017  Medication Sig  . albuterol (PROVENTIL HFA;VENTOLIN HFA) 108 (90 Base) MCG/ACT inhaler Inhale 2 puffs into the lungs every 6 (six) hours as needed for wheezing or shortness of breath.  . ALPRAZolam (XANAX) 0.5 MG tablet TAKE 1 TABLET BY MOUTH AT BEDTIME AS NEEDED  . cyanocobalamin 1000 MCG tablet Take 1,000 mcg by mouth daily.  Marland Kitchen diltiazem (CARDIZEM) 120 MG tablet Take 120 mg by mouth 2 times daily at 12 noon and 4 pm.   . flecainide (TAMBOCOR) 50 MG tablet Take 50 mg by mouth 2 (two) times daily.   Marland Kitchen gabapentin (NEURONTIN) 100 MG capsule TAKE 1 CAPSULE (100 MG TOTAL) BY MOUTH  3 (THREE) TIMES DAILY.  . magnesium oxide (MAG-OX) 400 MG tablet Take 400 mg by mouth daily.  . metoprolol tartrate (LOPRESSOR) 100 MG tablet Take 1 tablet (100 mg total) by mouth 2 (two) times daily.  Marland Kitchen omeprazole (PRILOSEC) 20 MG capsule Take 1 capsule (20 mg total) by mouth at bedtime.  . potassium chloride SA (K-DUR,KLOR-CON) 20 MEQ tablet Take 2 tablets (40 mEq total) by mouth 2 (two) times daily. And an additional 59meq ever Wednesday with metolazone  . prochlorperazine (COMPAZINE) 10 MG tablet Take 1 tablet (10 mg total) by mouth every 8 (eight) hours as needed for nausea or vomiting.  . Pyridoxine HCl (VITAMIN B6) 200 MG TABS Take 200 tablets by mouth 2 (two) times daily.   Marland Kitchen torsemide (DEMADEX) 20 MG tablet Take 2 tablets (40 mg total) by mouth 2 (two) times daily.  Marland Kitchen warfarin (COUMADIN) 2 MG tablet Take 2 mg by mouth every other day. Take 2mg  one day and take 2.5mg  next day, alternating.  . warfarin (COUMADIN) 2.5 MG tablet Take 2.5 mg by mouth every other day. Take 2mg  one day and take 2.5mg  next day, alternating.  . metolazone (ZAROXOLYN) 2.5 MG tablet Take 1 tablet (2.5 mg total) by mouth once a week.  . [DISCONTINUED] doxycycline (VIBRA-TABS) 100 MG tablet Take 1 tablet (100 mg total) by mouth 2 (two) times daily.  . [DISCONTINUED] levofloxacin (LEVAQUIN) 500 MG tablet Take 1 tablet (500 mg total) by mouth daily. (Patient not taking: Reported on 06/11/2017)   No facility-administered encounter medications on file as of 06/16/2017.     Allergies  Allergen Reactions  . Ampicillin   . Prinzide  [Lisinopril-Hydrochlorothiazide]   . Penicillins Rash    .Has patient had a PCN reaction causing immediate rash, facial/tongue/throat swelling, SOB or lightheadedness with hypotension: Unknown Has patient had a PCN reaction causing severe rash involving mucus membranes or skin necrosis: No Has patient had a PCN reaction that required hospitalization: No Has patient had a PCN reaction  occurring within the last 10 years: No If all of the above answers are "NO", then may proceed with Cephalosporin use.     Review of Systems  Constitutional: Negative.   Eyes: Negative.   Respiratory: Negative.   Cardiovascular: Positive for palpitations. Negative for chest pain, orthopnea and leg swelling.  Gastrointestinal: Positive for constipation and nausea. Negative for abdominal pain, blood in stool, diarrhea, heartburn and vomiting.  Genitourinary: Negative.   Skin: Negative.   Endo/Heme/Allergies: Negative.   Psychiatric/Behavioral: Negative.     Objective:  BP 124/66 (BP Location: Right Arm, Patient Position: Sitting, Cuff Size: Normal)   Pulse 98   Temp 97.7 F (36.5 C) (Oral)   Resp 18   SpO2 98%   Physical Exam  Constitutional:  She is oriented to person, place, and time and well-developed, well-nourished, and in no distress.  HENT:  Head: Normocephalic and atraumatic.  Eyes: Conjunctivae are normal.  Neck: Neck supple. No thyromegaly present.  Cardiovascular: Normal rate and normal heart sounds.  Pulmonary/Chest: Effort normal and breath sounds normal.  Abdominal: Soft. Bowel sounds are normal.  Genitourinary:  Genitourinary Comments: Perianal exam reveals apparent internal hemorrhoids.Mildly tender.  Neurological: She is alert and oriented to person, place, and time. GCS score is 15.  Skin: Skin is warm and dry.  Psychiatric: Mood, memory, affect and judgment normal.    Assessment and Plan :  1. Internal hemorrhoid  - senna-docusate (SENOKOT-S) 8.6-50 MG tablet; Take 1 tablet by mouth daily.  Dispense: 30 tablet; Refill: 12 - hydrocortisone (ANUSOL-HC) 25 MG suppository; Place 1 suppository (25 mg total) rectally 2 (two) times daily.  Dispense: 12 suppository; Refill: 0 - polyethylene glycol powder (GLYCOLAX/MIRALAX) powder; Take 17 g by mouth 2 (two) times daily as needed.  Dispense: 3350 g; Refill: 1  2. Difficulty in urination  - Urine Culture;  Future - Urine Culture 3.Constipation  I have done the exam and reviewed the chart and it is accurate to the best of my knowledge. Development worker, community has been used and  any errors in dictation or transcription are unintentional. Miguel Aschoff M.D. [facility]

## 2017-06-16 NOTE — Telephone Encounter (Signed)
Patient advised as below. Patient scheduled to be seen for constipation this afternoon

## 2017-06-18 ENCOUNTER — Other Ambulatory Visit: Payer: Self-pay

## 2017-06-18 ENCOUNTER — Emergency Department
Admission: EM | Admit: 2017-06-18 | Discharge: 2017-06-18 | Disposition: A | Discharge status: Home / Self Care | Payer: Medicare Other | Attending: Student in an Organized Health Care Education/Training Program | Admitting: Student in an Organized Health Care Education/Training Program

## 2017-06-18 ENCOUNTER — Emergency Department: Payer: Medicare Other

## 2017-06-18 ENCOUNTER — Ambulatory Visit: Payer: Self-pay | Admitting: Physician Assistant

## 2017-06-18 DIAGNOSIS — Z79899 Other long term (current) drug therapy: Secondary | ICD-10-CM | POA: Diagnosis not present

## 2017-06-18 DIAGNOSIS — I110 Hypertensive heart disease with heart failure: Secondary | ICD-10-CM | POA: Diagnosis not present

## 2017-06-18 DIAGNOSIS — R1084 Generalized abdominal pain: Secondary | ICD-10-CM

## 2017-06-18 DIAGNOSIS — K5900 Constipation, unspecified: Secondary | ICD-10-CM | POA: Diagnosis not present

## 2017-06-18 DIAGNOSIS — K5641 Fecal impaction: Secondary | ICD-10-CM | POA: Insufficient documentation

## 2017-06-18 DIAGNOSIS — I5032 Chronic diastolic (congestive) heart failure: Secondary | ICD-10-CM | POA: Diagnosis not present

## 2017-06-18 DIAGNOSIS — Z7901 Long term (current) use of anticoagulants: Secondary | ICD-10-CM | POA: Insufficient documentation

## 2017-06-18 LAB — CBC
HCT: 40.3 % (ref 35.0–47.0)
Hemoglobin: 13.1 g/dL (ref 12.0–16.0)
MCH: 28.1 pg (ref 26.0–34.0)
MCHC: 32.5 g/dL (ref 32.0–36.0)
MCV: 86.3 fL (ref 80.0–100.0)
PLATELETS: 258 10*3/uL (ref 150–440)
RBC: 4.67 MIL/uL (ref 3.80–5.20)
RDW: 15.7 % — ABNORMAL HIGH (ref 11.5–14.5)
WBC: 13.8 10*3/uL — ABNORMAL HIGH (ref 3.6–11.0)

## 2017-06-18 LAB — COMPREHENSIVE METABOLIC PANEL
ALBUMIN: 3.9 g/dL (ref 3.5–5.0)
ALK PHOS: 79 U/L (ref 38–126)
ALT: 13 U/L — ABNORMAL LOW (ref 14–54)
AST: 34 U/L (ref 15–41)
Anion gap: 11 (ref 5–15)
BUN: 15 mg/dL (ref 6–20)
CALCIUM: 9.3 mg/dL (ref 8.9–10.3)
CO2: 26 mmol/L (ref 22–32)
Chloride: 98 mmol/L — ABNORMAL LOW (ref 101–111)
Creatinine, Ser: 0.84 mg/dL (ref 0.44–1.00)
GFR calc Af Amer: >60 mL/min (ref >60)
GFR calc non Af Amer: >60 mL/min (ref >60)
GLUCOSE: 136 mg/dL — AB (ref 65–99)
Potassium: 5.1 mmol/L (ref 3.5–5.1)
SODIUM: 135 mmol/L (ref 135–145)
Total Bilirubin: 1.8 mg/dL — ABNORMAL HIGH (ref 0.3–1.2)
Total Protein: 8 g/dL (ref 6.5–8.1)

## 2017-06-18 LAB — LIPASE, BLOOD: Lipase: 25 U/L (ref 11–51)

## 2017-06-18 MED ORDER — GLYCERIN (ADULT) 2 G RE SUPP: 1.0000 | RECTAL | 0 refills | Status: AC | PRN

## 2017-06-18 MED ORDER — LIDOCAINE HCL 2 % EX GEL
1.0000 "application " | Freq: Once | CUTANEOUS | Status: AC
  Administered 2017-06-18: 1 via TOPICAL
  Filled 2017-06-18: qty 10

## 2017-06-18 MED ORDER — FENTANYL CITRATE (PF) 100 MCG/2ML IJ SOLN
50.0000 ug | INTRAMUSCULAR | Status: DC | PRN
  Administered 2017-06-18: 50 ug via INTRAMUSCULAR
  Filled 2017-06-18: qty 2

## 2017-06-18 MED ORDER — MAGNESIUM CITRATE PO SOLN
1.0000 | Freq: Once | ORAL | Status: AC
  Administered 2017-06-18: 1 via ORAL
  Filled 2017-06-18: qty 296

## 2017-06-18 NOTE — ED Triage Notes (Signed)
Pt c/o rectal pain with constipation for the past 9 days with some lower abd pain. Denies N/V/D

## 2017-06-18 NOTE — ED Provider Notes (Signed)
[facility] Emergency Department Provider Note    First MD Initiated Contact with Patient 06/18/17 1128     (approximate)  I have reviewed the triage vital signs and the nursing notes.   HISTORY  Chief Complaint Abdominal Pain and Constipation    HPI Mayci Haning Bixler is a 77 y.o. female with history of A. fib CHF on Coumadin presents with perirectal pain and not moving her bowel for over 8 days.  Does have a history of constipation is here with her son states that she has not been drinking as much fluid but has still been taking fiber or MiraLAX.  She was given prescription for suppositories but did not get them filled due to cost.  States the pain is currently moderate to severe and is unable to sit down due to severe pain.  No fevers.  No worsening shortness of breath.  She does have chronic home O2 requirement but but denies any chest pain cough or any other complaints at this time.  Past Medical History:  Diagnosis Date  . A-fib ([place])   . CHF (congestive heart failure) ([place])   . Hypertension   . Sleep apnea    Family History  Problem Relation Age of Onset  . Hypertension Mother   . Heart disease Mother   . CVA Mother   . Heart attack Mother   . Lung cancer Father   . Heart attack Sister   . CVA Sister   . Thyroid disease Sister   . Liver cancer Brother   . Thyroid disease Sister   . Thyroid disease Sister   . Diabetes Sister   . Dementia Sister   . Atrial fibrillation Sister    Past Surgical History:  Procedure Laterality Date  . EYE SURGERY    . POLYPECTOMY     colon poly premoved   Patient Active Problem List   Diagnosis Date Noted  . A-fib ([place]) 05/21/2017  . Hypokalemia 11/20/2016  . Lymphedema of both lower extremities 11/20/2016  . Peripheral edema 11/20/2016  . Chronic diastolic heart failure ([place]) 09/04/2016  . Restless legs 09/04/2016  . Fatigue 09/04/2016  . Sterile pyuria 08/31/2016  . Hyperglycemia 08/31/2016  .  Acute diastolic heart failure ([place]) 08/26/2016  . Community acquired pneumonia 08/04/2016  . Tricuspid regurgitation 05/15/2015  . Mitral valve regurgitation 05/15/2015  . Anxiety 08/25/2014  . Atrial fibrillation, chronic ([place]) 08/25/2014  . Benign neoplasm of colon 08/25/2014  . 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 Diabetic neuropathy ([place]) 08/25/2014  . Essential hypertension 08/25/2014  . 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 Adiposity 08/25/2014  . Obstructive apnea 08/25/2014  . Hypercholesterolemia without hypertriglyceridemia 08/25/2014  . B12 deficiency 08/25/2014  . H/O cardiac catheterization 07/15/2013  . Apnea, sleep 07/15/2013  . AF (paroxysmal atrial fibrillation) ([place]) 07/15/2013      Prior to Admission medications   Medication Sig Start Date End Date Taking? Authorizing Provider  albuterol (PROVENTIL HFA;VENTOLIN HFA) 108 (90 Base) MCG/ACT inhaler Inhale 2 puffs into the lungs every 6 (six) hours as needed for wheezing or shortness of breath.    [provider]  ALPRAZolam Duanne Moron) 0.5 MG tablet TAKE 1 TABLET BY MOUTH AT BEDTIME AS NEEDED 05/11/17   Jerrol Banana., MD  cyanocobalamin 1000 MCG tablet Take 1,000 mcg by mouth daily.    [provider]  diltiazem (CARDIZEM) 120 MG tablet Take 120 mg by mouth 2  times daily at 12 noon and 4 pm.     [provider]  flecainide (TAMBOCOR) 50 MG tablet Take 50 mg by mouth 2 (two) times daily.  11/16/13   [provider]  gabapentin (NEURONTIN) 100 MG capsule TAKE 1 CAPSULE (100 MG TOTAL) BY MOUTH 3 (THREE) TIMES DAILY. 12/21/16   Jerrol Banana., MD  hydrocortisone (ANUSOL-HC) 25 MG suppository Place 1 suppository (25 mg total) rectally 2 (two) times daily. 06/16/17   Jerrol Banana., MD  magnesium oxide (MAG-OX) 400 MG tablet Take 400 mg by mouth daily.    [provider]  metolazone (ZAROXOLYN) 2.5  MG tablet Take 1 tablet (2.5 mg total) by mouth once a week. 05/15/17 06/14/17  Alisa Graff, FNP  metoprolol tartrate (LOPRESSOR) 100 MG tablet Take 1 tablet (100 mg total) by mouth 2 (two) times daily. 08/31/16   Theodoro Grist, MD  omeprazole (PRILOSEC) 20 MG capsule Take 1 capsule (20 mg total) by mouth at bedtime. 04/15/17   Jerrol Banana., MD  polyethylene glycol powder [facility]) powder Take 17 g by mouth 2 (two) times daily as needed. 06/16/17   Jerrol Banana., MD  potassium chloride SA (K-DUR,KLOR-CON) 20 MEQ tablet Take 2 tablets (40 mEq total) by mouth 2 (two) times daily. And an additional 61meq ever Wednesday with metolazone 03/09/17   Alisa Graff, FNP  prochlorperazine (COMPAZINE) 10 MG tablet Take 1 tablet (10 mg total) by mouth every 8 (eight) hours as needed for nausea or vomiting. 09/05/16   Birdie Sons, MD  Pyridoxine HCl (VITAMIN B6) 200 MG TABS Take 200 tablets by mouth 2 (two) times daily.  11/16/13   [provider]  senna-docusate (SENOKOT-S) 8.6-50 MG tablet Take 1 tablet by mouth daily. 06/16/17   Jerrol Banana., MD  torsemide (DEMADEX) 20 MG tablet Take 2 tablets (40 mg total) by mouth 2 (two) times daily. 05/15/17   Alisa Graff, FNP  warfarin (COUMADIN) 2 MG tablet Take 2 mg by mouth every other day. Take 2mg  one day and take 2.5mg  next day, alternating.    [provider]  warfarin (COUMADIN) 2.5 MG tablet Take 2.5 mg by mouth every other day. Take 2mg  one day and take 2.5mg  next day, alternating. 11/16/13   [provider]    Allergies Ampicillin; Prinzide  [lisinopril-hydrochlorothiazide]; and Penicillins    Social History Social History   Tobacco Use  . Smoking status: Never Smoker  . Smokeless tobacco: Never Used  Substance Use Topics  . Alcohol use: No  . Drug use: No    Review of Systems Patient denies headaches, rhinorrhea, blurry vision, numbness, shortness of breath, chest pain, edema,  cough, abdominal pain, nausea, vomiting, diarrhea, dysuria, fevers, rashes or hallucinations unless otherwise stated above in HPI. ____________________________________________   PHYSICAL EXAM:  VITAL SIGNS: Vitals:   06/18/17 1112 06/18/17 1113  BP:    Pulse: (!) 105   Resp:    Temp:  (!) 97.5 F (36.4 C)  SpO2: 99%     Constitutional: Alert and oriented.uncomfortable lying on her side, in no acute distress. Eyes: Conjunctivae are normal.  Head: Atraumatic. Nose: No congestion/rhinnorhea. Mouth/Throat: Mucous membranes are moist.   Neck: No stridor. Painless ROM.  Cardiovascular: Normal rate, regular rhythm. Grossly normal heart sounds.  Good peripheral circulation. Respiratory: Normal respiratory effort.  No retractions. Lungs CTAB. Gastrointestinal: Soft tympanic to percussion, ttp of mid abd. No distention. No abdominal bruits. No  CVA tenderness. Genitourinary: No rectal masses or significant bleeding.  Did have a hard formed stool ball in the rectal vault. Musculoskeletal: No lower extremity tenderness nor edema.  No joint effusions. Neurologic:  Normal speech and language. No gross focal neurologic deficits are appreciated. No facial droop Skin:  Skin is warm, dry and intact. No rash noted. Psychiatric: Mood and affect are normal. Speech and behavior are normal.  ____________________________________________   LABS (all labs ordered are listed, but only abnormal results are displayed)  Results for orders placed or performed during the hospital encounter of 06/18/17 (from the past 24 hour(s))  Lipase, blood     Status: None   Collection Time: 06/18/17 11:21 AM  Result Value Ref Range   Lipase 25 11 - 51 U/L  Comprehensive metabolic panel     Status: Abnormal   Collection Time: 06/18/17 11:21 AM  Result Value Ref Range   Sodium 135 135 - 145 mmol/L   Potassium 5.1 3.5 - 5.1 mmol/L   Chloride 98 (L) 101 - 111 mmol/L   CO2 26 22 - 32 mmol/L   Glucose, Bld 136 (H) 65 -  99 mg/dL   BUN 15 6 - 20 mg/dL   Creatinine, Ser 0.84 0.44 - 1.00 mg/dL   Calcium 9.3 8.9 - 10.3 mg/dL   Total Protein 8.0 6.5 - 8.1 g/dL   Albumin 3.9 3.5 - 5.0 g/dL   AST 34 15 - 41 U/L   ALT 13 (L) 14 - 54 U/L   Alkaline Phosphatase 79 38 - 126 U/L   Total Bilirubin 1.8 (H) 0.3 - 1.2 mg/dL   GFR calc non Af Amer >60 >60 mL/min   GFR calc Af Amer >60 >60 mL/min   Anion gap 11 5 - 15  CBC     Status: Abnormal   Collection Time: 06/18/17 11:21 AM  Result Value Ref Range   WBC 13.8 (H) 3.6 - 11.0 K/uL   RBC 4.67 3.80 - 5.20 MIL/uL   Hemoglobin 13.1 12.0 - 16.0 g/dL   HCT 40.3 35.0 - 47.0 %   MCV 86.3 80.0 - 100.0 fL   MCH 28.1 26.0 - 34.0 pg   MCHC 32.5 32.0 - 36.0 g/dL   RDW 15.7 (H) 11.5 - 14.5 %   Platelets 258 150 - 440 K/uL   ____________________________________________  ____________________________________________  RADIOLOGY  I personally reviewed all radiographic images ordered to evaluate for the above acute complaints and reviewed radiology reports and findings.  These findings were personally discussed with the patient.  Please see medical record for radiology report.  ____________________________________________   PROCEDURES  Procedure(s) performed:  Procedures  ------------------------------------------------------------------------------------------------------------------- Fecal Disimpaction Procedure Note:  Performed by me:  Patient placed in the lateral recumbent position with knees drawn towards chest. Nurse present for patient support. Large amount of hard brown stool removed. No complications during procedure.   ------------------------------------------------------------------------------------------------------------------    Critical Care performed: no ____________________________________________   INITIAL IMPRESSION / ASSESSMENT AND PLAN / ED COURSE  Pertinent labs & imaging results that were available during my care of the patient  were reviewed by me and considered in my medical decision making (see chart for details).  DDX: constipation, obstipation, abscess, hemorrhoids, mass, perforation  Kamren Heskett Depass is a 77 y.o. who presents to the ED with abdominal pain as described above.  CT imaging ordered to evaluate for acute abnormality shows no evidence of perforation but stercoral colitis large stool ball in the rectal vault and constipation.  No  evidence of obstructive process.  Clinical Course as of Jun 19 1638  Thu Jun 18, 2017  1426 Patient has moved her bowels.  This point will have patient start additional suppositories.  Patient will be stable and appropriate for outpatient follow-up.   [PR]    Clinical Course User Index [PR] Merlyn Lot, MD     As part of my medical decision making, I reviewed the following data within the [facility] notes reviewed and incorporated, Labs reviewed, notes from prior ED visits and [place] Controlled Substance Database   ____________________________________________   FINAL CLINICAL IMPRESSION(S) / ED DIAGNOSES  Final diagnoses:  Fecal impaction ([place])  Generalized abdominal pain      NEW MEDICATIONS STARTED DURING THIS VISIT:  New Prescriptions   No medications on file     Note:  This document was prepared using Dragon voice recognition software and may include unintentional dictation errors.    Merlyn Lot, MD 06/18/17 1640

## 2017-06-18 NOTE — ED Notes (Signed)
Dr. Quentin Cornwall at bedside to perform disimpaction.

## 2017-06-18 NOTE — ED Triage Notes (Signed)
FIRST NURSE NOTE-here for constipation, last BM was 8 days ago. Not been drinking as much. Internal hemorrhoids per pt.  Appears uncomfortable to sit but no distress noted. Alert.

## 2017-06-18 NOTE — ED Notes (Addendum)
Pt states she has internal hemorrhoids. States last BM 8-9 days. C/o lower abd pain. Pt lying on L side. Family member states pt is dehydrated per PCP visit on Tuesday and Thursday. Has tried miralax and stool softeners. Family member thinks pt isn't drinking enough. Pt states she hasn't eaten much in last few days. "I can't get nothing to come out but a smear." pt wears 2 L nasal cannula chronically. Denies vomiting or fevers.

## 2017-06-18 NOTE — ED Notes (Signed)
Pt passing stool sitting on toilet.

## 2017-06-18 NOTE — ED Notes (Signed)
Pt back on toilet passing more stool.

## 2017-06-18 NOTE — ED Notes (Signed)
Pt was cleaned up after messing in her diaper

## 2017-06-22 ENCOUNTER — Telehealth: Payer: Self-pay | Admitting: Family Medicine

## 2017-06-22 NOTE — Telephone Encounter (Signed)
Pt called saying she had procedure on Thursday for impaction.  She is burning when she urinates now.  She does not want to come in  Her call back is [phone]  Thanks teri

## 2017-06-23 ENCOUNTER — Telehealth: Payer: Self-pay | Admitting: Family Medicine

## 2017-06-23 DIAGNOSIS — R39198 Other difficulties with micturition: Secondary | ICD-10-CM | POA: Diagnosis not present

## 2017-06-23 NOTE — Telephone Encounter (Signed)
Explained to the son that the culture will take a couple of days.

## 2017-06-23 NOTE — Telephone Encounter (Signed)
pts son called saying mom had given a Urine sample today but they have not heard anything back yet.  Call back is [phone]

## 2017-06-23 NOTE — Telephone Encounter (Signed)
FYI  Patient had order from where she could not collect it the day of her appointment.  This was not a new order.

## 2017-06-26 LAB — URINE CULTURE

## 2017-06-27 ENCOUNTER — Other Ambulatory Visit: Payer: Self-pay

## 2017-06-27 ENCOUNTER — Emergency Department: Payer: Medicare Other

## 2017-06-27 ENCOUNTER — Ambulatory Visit (INDEPENDENT_AMBULATORY_CARE_PROVIDER_SITE_OTHER): Payer: Medicare Other | Admitting: Family Medicine

## 2017-06-27 ENCOUNTER — Inpatient Hospital Stay
Admission: EM | Admit: 2017-06-27 | Discharge: 2017-06-29 | DRG: 309 | Disposition: A | Discharge status: Home Health | Payer: Medicare Other | Attending: Internal Medicine | Admitting: Internal Medicine

## 2017-06-27 ENCOUNTER — Encounter: Payer: Self-pay | Admitting: Family Medicine

## 2017-06-27 VITALS — BP 120/80 | HR 110 | Temp 97.9°F | Resp 16

## 2017-06-27 DIAGNOSIS — G4730 Sleep apnea, unspecified: Secondary | ICD-10-CM | POA: Diagnosis not present

## 2017-06-27 DIAGNOSIS — I5032 Chronic diastolic (congestive) heart failure: Secondary | ICD-10-CM

## 2017-06-27 DIAGNOSIS — I4891 Unspecified atrial fibrillation: Secondary | ICD-10-CM

## 2017-06-27 DIAGNOSIS — K5900 Constipation, unspecified: Secondary | ICD-10-CM | POA: Diagnosis present

## 2017-06-27 DIAGNOSIS — I482 Chronic atrial fibrillation, unspecified: Secondary | ICD-10-CM

## 2017-06-27 DIAGNOSIS — K5641 Fecal impaction: Secondary | ICD-10-CM | POA: Diagnosis not present

## 2017-06-27 DIAGNOSIS — K5904 Chronic idiopathic constipation: Secondary | ICD-10-CM

## 2017-06-27 DIAGNOSIS — Z7901 Long term (current) use of anticoagulants: Secondary | ICD-10-CM

## 2017-06-27 DIAGNOSIS — J9811 Atelectasis: Secondary | ICD-10-CM | POA: Diagnosis not present

## 2017-06-27 DIAGNOSIS — Z79899 Other long term (current) drug therapy: Secondary | ICD-10-CM | POA: Diagnosis not present

## 2017-06-27 DIAGNOSIS — I959 Hypotension, unspecified: Secondary | ICD-10-CM | POA: Diagnosis not present

## 2017-06-27 DIAGNOSIS — F419 Anxiety disorder, unspecified: Secondary | ICD-10-CM

## 2017-06-27 DIAGNOSIS — I110 Hypertensive heart disease with heart failure: Secondary | ICD-10-CM | POA: Diagnosis present

## 2017-06-27 DIAGNOSIS — G4733 Obstructive sleep apnea (adult) (pediatric): Secondary | ICD-10-CM

## 2017-06-27 DIAGNOSIS — N3000 Acute cystitis without hematuria: Secondary | ICD-10-CM

## 2017-06-27 DIAGNOSIS — N390 Urinary tract infection, site not specified: Secondary | ICD-10-CM | POA: Diagnosis present

## 2017-06-27 DIAGNOSIS — R791 Abnormal coagulation profile: Secondary | ICD-10-CM | POA: Diagnosis not present

## 2017-06-27 DIAGNOSIS — R918 Other nonspecific abnormal finding of lung field: Secondary | ICD-10-CM | POA: Diagnosis not present

## 2017-06-27 LAB — BASIC METABOLIC PANEL
Anion gap: 6 (ref 5–15)
BUN: 19 mg/dL (ref 6–20)
CHLORIDE: 108 mmol/L (ref 101–111)
CO2: 22 mmol/L (ref 22–32)
Calcium: 9.3 mg/dL (ref 8.9–10.3)
Creatinine, Ser: 0.78 mg/dL (ref 0.44–1.00)
GFR calc Af Amer: >60 mL/min (ref >60)
Glucose, Bld: 148 mg/dL — ABNORMAL HIGH (ref 65–99)
POTASSIUM: 5.2 mmol/L — AB (ref 3.5–5.1)
SODIUM: 136 mmol/L (ref 135–145)

## 2017-06-27 LAB — MAGNESIUM: Magnesium: 2.2 mg/dL (ref 1.7–2.4)

## 2017-06-27 LAB — CBC
HEMATOCRIT: 36.3 % (ref 35.0–47.0)
HEMOGLOBIN: 12.4 g/dL (ref 12.0–16.0)
MCH: 29.4 pg (ref 26.0–34.0)
MCHC: 34.2 g/dL (ref 32.0–36.0)
MCV: 86.1 fL (ref 80.0–100.0)
Platelets: 276 10*3/uL (ref 150–440)
RBC: 4.21 MIL/uL (ref 3.80–5.20)
RDW: 18 % — ABNORMAL HIGH (ref 11.5–14.5)
WBC: 8.7 10*3/uL (ref 3.6–11.0)

## 2017-06-27 LAB — PROTIME-INR: PROTHROMBIN TIME: 81.1 s — AB (ref 11.4–15.2)

## 2017-06-27 LAB — URINALYSIS, COMPLETE (UACMP) WITH MICROSCOPIC
Bilirubin Urine: NEGATIVE
Glucose, UA: NEGATIVE mg/dL
HGB URINE DIPSTICK: NEGATIVE
Ketones, ur: NEGATIVE mg/dL
NITRITE: NEGATIVE
PH: 6 (ref 5.0–8.0)
Protein, ur: NEGATIVE mg/dL
SPECIFIC GRAVITY, URINE: 1.011 (ref 1.005–1.030)

## 2017-06-27 LAB — TROPONIN I
Troponin I: <0.03 ng/mL (ref <0.03)
Troponin I: <0.03 ng/mL (ref <0.03)

## 2017-06-27 MED ORDER — DILTIAZEM HCL 100 MG IV SOLR
5.0000 mg/h | Freq: Once | INTRAVENOUS | Status: AC
  Administered 2017-06-27: 5 mg/h via INTRAVENOUS
  Filled 2017-06-27: qty 100

## 2017-06-27 MED ORDER — SODIUM CHLORIDE 0.9 % IV SOLN
Freq: Once | INTRAVENOUS | Status: AC
  Administered 2017-06-27: 12:00:00 via INTRAVENOUS

## 2017-06-27 MED ORDER — ACETAMINOPHEN 650 MG RE SUPP: 650.0000 mg | Freq: Four times a day (QID) | RECTAL | Status: DC | PRN

## 2017-06-27 MED ORDER — SODIUM CHLORIDE 0.9 % IV SOLN
1.0000 g | Freq: Once | INTRAVENOUS | Status: AC
  Administered 2017-06-27: 1 g via INTRAVENOUS
  Filled 2017-06-27: qty 10

## 2017-06-27 MED ORDER — GABAPENTIN 100 MG PO CAPS
100.0000 mg | ORAL_CAPSULE | Freq: Three times a day (TID) | ORAL | Status: DC
  Administered 2017-06-27 – 2017-06-29 (×5): 100 mg via ORAL
  Filled 2017-06-27 (×6): qty 1

## 2017-06-27 MED ORDER — DILTIAZEM HCL 25 MG/5ML IV SOLN
10.0000 mg | Freq: Once | INTRAVENOUS | Status: AC
  Administered 2017-06-27: 10 mg via INTRAVENOUS
  Filled 2017-06-27: qty 5

## 2017-06-27 MED ORDER — ALBUTEROL SULFATE (2.5 MG/3ML) 0.083% IN NEBU: 3.0000 mL | INHALATION_SOLUTION | Freq: Four times a day (QID) | RESPIRATORY_TRACT | Status: DC | PRN

## 2017-06-27 MED ORDER — ONDANSETRON HCL 4 MG/2ML IJ SOLN: 4.0000 mg | Freq: Four times a day (QID) | INTRAMUSCULAR | Status: DC | PRN

## 2017-06-27 MED ORDER — DILTIAZEM HCL 100 MG IV SOLR
5.0000 mg/h | INTRAVENOUS | Status: DC
  Administered 2017-06-27 – 2017-06-28 (×3): 10 mg/h via INTRAVENOUS
  Filled 2017-06-27 (×2): qty 100

## 2017-06-27 MED ORDER — MAGNESIUM OXIDE 400 (241.3 MG) MG PO TABS
400.0000 mg | ORAL_TABLET | Freq: Every day | ORAL | Status: DC
  Administered 2017-06-28 – 2017-06-29 (×2): 400 mg via ORAL
  Filled 2017-06-27 (×4): qty 1

## 2017-06-27 MED ORDER — PANTOPRAZOLE SODIUM 40 MG PO TBEC
40.0000 mg | DELAYED_RELEASE_TABLET | Freq: Every day | ORAL | Status: DC
  Administered 2017-06-28 – 2017-06-29 (×2): 40 mg via ORAL
  Filled 2017-06-27 (×2): qty 1

## 2017-06-27 MED ORDER — ALPRAZOLAM 0.5 MG PO TABS
0.5000 mg | ORAL_TABLET | Freq: Every evening | ORAL | Status: DC | PRN
  Administered 2017-06-27 – 2017-06-28 (×2): 0.5 mg via ORAL
  Filled 2017-06-27 (×2): qty 1

## 2017-06-27 MED ORDER — ACETAMINOPHEN 325 MG PO TABS: 650.0000 mg | ORAL_TABLET | Freq: Four times a day (QID) | ORAL | Status: DC | PRN

## 2017-06-27 MED ORDER — METOLAZONE 2.5 MG PO TABS
2.5000 mg | ORAL_TABLET | ORAL | Status: DC
  Filled 2017-06-27: qty 1

## 2017-06-27 MED ORDER — PHYTONADIONE 5 MG PO TABS
5.0000 mg | ORAL_TABLET | Freq: Once | ORAL | Status: AC
  Administered 2017-06-27: 5 mg via ORAL
  Filled 2017-06-27: qty 1

## 2017-06-27 MED ORDER — SENNOSIDES-DOCUSATE SODIUM 8.6-50 MG PO TABS
2.0000 | ORAL_TABLET | Freq: Every day | ORAL | Status: DC
  Administered 2017-06-28 – 2017-06-29 (×2): 2 via ORAL
  Filled 2017-06-27 (×2): qty 2

## 2017-06-27 MED ORDER — METOPROLOL TARTRATE 50 MG PO TABS
100.0000 mg | ORAL_TABLET | Freq: Two times a day (BID) | ORAL | Status: DC
  Administered 2017-06-27 – 2017-06-29 (×4): 100 mg via ORAL
  Filled 2017-06-27 (×4): qty 2

## 2017-06-27 MED ORDER — VITAMIN B-6 50 MG PO TABS
200.0000 mg | ORAL_TABLET | Freq: Two times a day (BID) | ORAL | Status: DC
Start: 2017-06-27 — End: 2017-06-29
  Administered 2017-06-28 – 2017-06-29 (×3): 200 mg via ORAL
  Filled 2017-06-27: qty 4
  Filled 2017-06-27: qty 2
  Filled 2017-06-27 (×4): qty 4

## 2017-06-27 MED ORDER — FLECAINIDE ACETATE 50 MG PO TABS
50.0000 mg | ORAL_TABLET | Freq: Two times a day (BID) | ORAL | Status: DC
  Administered 2017-06-27 – 2017-06-29 (×4): 50 mg via ORAL
  Filled 2017-06-27 (×5): qty 1

## 2017-06-27 MED ORDER — CEPHALEXIN 500 MG PO CAPS
500.0000 mg | ORAL_CAPSULE | Freq: Two times a day (BID) | ORAL | Status: DC
  Administered 2017-06-28 – 2017-06-29 (×3): 500 mg via ORAL
  Filled 2017-06-27 (×4): qty 1

## 2017-06-27 MED ORDER — POLYETHYLENE GLYCOL 3350 17 GM/SCOOP PO POWD: 17.0000 g | Freq: Two times a day (BID) | ORAL | Status: DC

## 2017-06-27 MED ORDER — TORSEMIDE 20 MG PO TABS
40.0000 mg | ORAL_TABLET | Freq: Two times a day (BID) | ORAL | Status: DC
  Administered 2017-06-27 – 2017-06-28 (×3): 40 mg via ORAL
  Filled 2017-06-27 (×2): qty 2

## 2017-06-27 MED ORDER — ONDANSETRON HCL 4 MG PO TABS: 4.0000 mg | ORAL_TABLET | Freq: Four times a day (QID) | ORAL | Status: DC | PRN

## 2017-06-27 MED ORDER — ACETAMINOPHEN 500 MG PO TABS
1000.0000 mg | ORAL_TABLET | Freq: Once | ORAL | Status: AC
  Administered 2017-06-27: 1000 mg via ORAL
  Filled 2017-06-27: qty 2

## 2017-06-27 MED ORDER — DILTIAZEM HCL ER COATED BEADS 120 MG PO CP24
120.0000 mg | ORAL_CAPSULE | Freq: Every day | ORAL | Status: DC
  Administered 2017-06-27 – 2017-06-29 (×3): 120 mg via ORAL
  Filled 2017-06-27 (×3): qty 1

## 2017-06-27 MED ORDER — VITAMIN B-12 1000 MCG PO TABS
1000.0000 ug | ORAL_TABLET | Freq: Every day | ORAL | Status: DC
  Administered 2017-06-28 – 2017-06-29 (×3): 1000 ug via ORAL
  Filled 2017-06-27 (×2): qty 1

## 2017-06-27 MED ORDER — FENTANYL CITRATE (PF) 100 MCG/2ML IJ SOLN
12.5000 ug | Freq: Once | INTRAMUSCULAR | Status: AC
  Administered 2017-06-27: 12.5 ug via INTRAVENOUS
  Filled 2017-06-27: qty 2

## 2017-06-27 MED ORDER — POLYETHYLENE GLYCOL 3350 17 G PO PACK
17.0000 g | PACK | Freq: Two times a day (BID) | ORAL | Status: DC
  Administered 2017-06-27 – 2017-06-29 (×3): 17 g via ORAL
  Filled 2017-06-27 (×4): qty 1

## 2017-06-27 MED ORDER — DILTIAZEM HCL 25 MG/5ML IV SOLN
20.0000 mg | Freq: Once | INTRAVENOUS | Status: AC
  Administered 2017-06-27: 20 mg via INTRAVENOUS
  Filled 2017-06-27: qty 5

## 2017-06-27 NOTE — H&P (Signed)
[facility] NAME: Hannah Faulkner    MR#:  [id number]  DATE OF BIRTH:  1940/10/24  DATE OF ADMISSION:  06/27/2017  PRIMARY CARE PHYSICIAN: Jerrol Banana., MD   REQUESTING/REFERRING PHYSICIAN: Dr Jimmye Norman  CHIEF COMPLAINT:  constipation feeling weak  HISTORY OF PRESENT ILLNESS:  Hannah Faulkner  is a 77 y.o. female with a known history of atrial fibrillation on Coumadin, hypertension, history of constipation who was seen last Thursday in the emergency room got this impacted from her constipation sent home comes back with having rectal pain and was found to be constipated again and afib with RVR heart rate was in the 170s. She received two doses of IV diltiazem. Patient is going to be started on diltiazem drip. It also got enema in the emergency room. She is trying to have a bowel movement.  she being admitted with atrial fibrillation with RVR, constipation, UTI.  PAST MEDICAL HISTORY:   Past Medical History:  Diagnosis Date  . A-fib ([place])   . CHF (congestive heart failure) ([place])   . Hypertension   . Sleep apnea     PAST SURGICAL HISTOIRY:   Past Surgical History:  Procedure Laterality Date  . EYE SURGERY    . POLYPECTOMY     colon poly premoved    SOCIAL HISTORY:   Social History   Tobacco Use  . Smoking status: Never Smoker  . Smokeless tobacco: Never Used  Substance Use Topics  . Alcohol use: No    FAMILY HISTORY:   Family History  Problem Relation Age of Onset  . Hypertension Mother   . Heart disease Mother   . CVA Mother   . Heart attack Mother   . Lung cancer Father   . Heart attack Sister   . CVA Sister   . Thyroid disease Sister   . Liver cancer Brother   . Thyroid disease Sister   . Thyroid disease Sister   . Diabetes Sister   . Dementia Sister   . Atrial fibrillation Sister     DRUG ALLERGIES:   Allergies  Allergen Reactions  . Ampicillin   . Prinzide   [Lisinopril-Hydrochlorothiazide]   . Penicillins Rash    .Has patient had a PCN reaction causing immediate rash, facial/tongue/throat swelling, SOB or lightheadedness with hypotension: Unknown Has patient had a PCN reaction causing severe rash involving mucus membranes or skin necrosis: No Has patient had a PCN reaction that required hospitalization: No Has patient had a PCN reaction occurring within the last 10 years: No If all of the above answers are "NO", then may proceed with Cephalosporin use.     REVIEW OF SYSTEMS:  Review of Systems  Constitutional: Negative for chills, fever and weight loss.  HENT: Negative for ear discharge, ear pain and nosebleeds.   Eyes: Negative for blurred vision, pain and discharge.  Respiratory: Negative for sputum production, shortness of breath, wheezing and stridor.   Cardiovascular: Positive for palpitations. Negative for chest pain, orthopnea and PND.  Gastrointestinal: Positive for constipation. Negative for abdominal pain, diarrhea, nausea and vomiting.  Genitourinary: Positive for dysuria. Negative for frequency and urgency.  Musculoskeletal: Negative for back pain and joint pain.  Neurological: Negative for sensory change, speech change, focal weakness and weakness.  Psychiatric/Behavioral: Negative for depression and hallucinations. The patient is not nervous/anxious.      MEDICATIONS AT HOME:   Prior to Admission medications   Medication Sig Start Date End Date  Taking? Authorizing Provider  ALPRAZolam Duanne Moron) 0.5 MG tablet TAKE 1 TABLET BY MOUTH AT BEDTIME AS NEEDED 05/11/17  Yes Jerrol Banana., MD  cyanocobalamin 1000 MCG tablet Take 1,000 mcg by mouth daily.   Yes [provider]  diltiazem (CARDIZEM) 120 MG tablet Take 120 mg by mouth 2 times daily at 12 noon and 4 pm.    Yes [provider]  flecainide (TAMBOCOR) 50 MG tablet Take 50 mg by mouth 2 (two) times daily.  11/16/13  Yes [provider]   gabapentin (NEURONTIN) 100 MG capsule TAKE 1 CAPSULE (100 MG TOTAL) BY MOUTH 3 (THREE) TIMES DAILY. 12/21/16  Yes Jerrol Banana., MD  glycerin adult 2 g suppository Place 1 suppository rectally as needed for constipation. 06/18/17  Yes Merlyn Lot, MD  magnesium oxide (MAG-OX) 400 MG tablet Take 400 mg by mouth daily.   Yes [provider]  metolazone (ZAROXOLYN) 2.5 MG tablet Take 1 tablet (2.5 mg total) by mouth once a week. 05/15/17 06/27/17 Yes Darylene Price A, FNP  metoprolol tartrate (LOPRESSOR) 100 MG tablet Take 1 tablet (100 mg total) by mouth 2 (two) times daily. 08/31/16  Yes Theodoro Grist, MD  omeprazole (PRILOSEC) 20 MG capsule Take 1 capsule (20 mg total) by mouth at bedtime. 04/15/17  Yes Jerrol Banana., MD  polyethylene glycol powder [facility]) powder Take 17 g by mouth 2 (two) times daily as needed. 06/16/17  Yes Jerrol Banana., MD  potassium chloride SA (K-DUR,KLOR-CON) 20 MEQ tablet Take 2 tablets (40 mEq total) by mouth 2 (two) times daily. And an additional 4meq ever Wednesday with metolazone 03/09/17  Yes Darylene Price A, FNP  Pyridoxine HCl (VITAMIN B6) 200 MG TABS Take 200 tablets by mouth 2 (two) times daily.  11/16/13  Yes [provider]  senna-docusate (SENOKOT-S) 8.6-50 MG tablet Take 1 tablet by mouth daily. 06/16/17  Yes Jerrol Banana., MD  torsemide (DEMADEX) 20 MG tablet Take 2 tablets (40 mg total) by mouth 2 (two) times daily. 05/15/17  Yes Darylene Price A, FNP  warfarin (COUMADIN) 2 MG tablet Take 2 mg by mouth every other day. Take 2mg  one day and take 2.5mg  next day, alternating.   Yes [provider]  warfarin (COUMADIN) 2.5 MG tablet Take 2.5 mg by mouth every other day. Take 2mg  one day and take 2.5mg  next day, alternating. 11/16/13  Yes [provider]  albuterol (PROVENTIL HFA;VENTOLIN HFA) 108 (90 Base) MCG/ACT inhaler Inhale 2 puffs into the lungs every 6 (six) hours as needed for  wheezing or shortness of breath.    [provider]  prochlorperazine (COMPAZINE) 10 MG tablet Take 1 tablet (10 mg total) by mouth every 8 (eight) hours as needed for nausea or vomiting. 09/05/16   Birdie Sons, MD      VITAL SIGNS:  Blood pressure 123/88, pulse (!) 143, temperature 98 F (36.7 C), temperature source Oral, resp. rate (!) 23, height 5\' 1"  (1.549 m), weight 68.5 kg (151 lb), SpO2 93 %.  PHYSICAL EXAMINATION:  GENERAL:  77 y.o.-year-old patient lying in the bed with no acute distress.  EYES: Pupils equal, round, reactive to light and accommodation. No scleral icterus. Extraocular muscles intact.  HEENT: Head atraumatic, normocephalic. Oropharynx and nasopharynx clear.  NECK:  Supple, no jugular venous distention. No thyroid enlargement, no tenderness.  LUNGS: Normal breath sounds bilaterally, no wheezing, rales,rhonchi or crepitation. No use of accessory muscles of respiration.  CARDIOVASCULAR:  S1, S2 normal. No murmurs, rubs, or gallops. tacyarrythmia ABDOMEN: Soft, nontender, nondistended. Bowel sounds present. No organomegaly or mass.  EXTREMITIES: No pedal edema, cyanosis, or clubbing.  NEUROLOGIC: Cranial nerves II through XII are intact. Muscle strength 5/5 in all extremities. Sensation intact. Gait not checked.  PSYCHIATRIC: The patient is alert and oriented x 3.  SKIN: No obvious rash, lesion, or ulcer.   LABORATORY PANEL:   CBC Recent Labs  Lab 06/27/17 1200  WBC 8.7  HGB 12.4  HCT 36.3  PLT 276   ------------------------------------------------------------------------------------------------------------------  Chemistries  Recent Labs  Lab 06/27/17 1200  NA 136  K 5.2*  CL 108  CO2 22  GLUCOSE 148*  BUN 19  CREATININE 0.78  CALCIUM 9.3   ------------------------------------------------------------------------------------------------------------------  Cardiac Enzymes Recent Labs  Lab 06/27/17 1200  TROPONINI <0.03    ------------------------------------------------------------------------------------------------------------------  RADIOLOGY:  Dg Chest Port 1 View  Result Date: 06/27/2017 CLINICAL DATA:  77 year old female with rectal pain, recent fecal disimpaction procedure EXAM: PORTABLE CHEST 1 VIEW COMPARISON:  Prior chest x-ray 06/15/2017 FINDINGS: Interval development of patchy airspace opacity obscuring the right cardiac margin and the right diaphragm. Suspect a layering pleural effusion is well. Linear opacities in the left lateral lung base likely reflect atelectasis. Stable cardiomegaly. Atherosclerotic calcifications are present in the transverse aorta. The upper lungs are well aerated and clear. No pneumothorax. No acute osseous abnormality. IMPRESSION: 1. New right basilar airspace opacity concerning for a combination of pleural effusion with atelectasis and/or infiltrate. 2. Mildly increased left basilar atelectasis. 3. Stable cardiomegaly. Electronically Signed   By: Jacqulynn Cadet M.D.   On: 06/27/2017 13:03   Dg Abd 2 Views  Result Date: 06/27/2017 CLINICAL DATA:  Rectal pain today. EXAM: ABDOMEN - 2 VIEW COMPARISON:  None. FINDINGS: There is a small effusion and underlying atelectasis in the right base. No free air, portal venous gas, or pneumatosis. The bowel gas pattern is nonobstructive. Calcifications in the pelvis are likely phleboliths. No other acute abnormalities. IMPRESSION: 1. Small effusion and underlying opacity in the right lung base. 2. No other acute abnormalities. Electronically Signed   By: Dorise Bullion III M.D   On: 06/27/2017 13:03    EKG:  afib with RVR  IMPRESSION AND PLAN:   Hannah Faulkner  is a 77 y.o. female with a known history of atrial fibrillation on Coumadin, hypertension, history of constipation who was seen last Thursday in the emergency room got this impacted from her constipation sent home comes back with having rectal pain and was found to be constipated  again and afib with RVR  1. atrial fibrillation with RVR -admit to telemetry -IV diltiazem drip -continue Flecainide, metoprolol -cardiology consultation  2. supra therapeutic INR -pt came in with INR for more than 10 -oral vitamin K was given -pharmacy to dose warfarin and monitor INR  3. Asymptomatic UTI -Keflex for three days  4. Recurrent constipation -received enema -will give bowel prep on a daily basis  5. DVT prophylaxis -patient has supra-therapeutic INR   All the records are reviewed and case discussed with ED provider. Management plans discussed with the patient, family and they are in agreement.  CODE STATUS: full  TOTAL TIME TAKING CARE OF THIS PATIENT: 50 minutes.    Fritzi Mandes M.D on 06/27/2017 at 3:42 PM  Between 7am to 6pm - Pager - [phone]  After 6pm go to [email or web address] - password [email or web address] [facility]  [facility]  Office  [phone]  CC: Primary care physician; Miguel Aschoff  Kaylyn Lim., MD

## 2017-06-27 NOTE — Plan of Care (Signed)
Heart rate has improved while on the cardizem drip. Patient is currently in Afib.

## 2017-06-27 NOTE — Progress Notes (Signed)
[place] for Warfarin Indication: atrial fibrillation  Allergies  Allergen Reactions  . Ampicillin   . Prinzide  [Lisinopril-Hydrochlorothiazide]   . Penicillins Rash    .Has patient had a PCN reaction causing immediate rash, facial/tongue/throat swelling, SOB or lightheadedness with hypotension: Unknown Has patient had a PCN reaction causing severe rash involving mucus membranes or skin necrosis: No Has patient had a PCN reaction that required hospitalization: No Has patient had a PCN reaction occurring within the last 10 years: No If all of the above answers are "NO", then may proceed with Cephalosporin use.     Patient Measurements: Height: 5\' 1"  (154.9 cm) Weight: 151 lb (68.5 kg) IBW/kg (Calculated) : 47.8  Vital Signs: Temp: 98 F (36.7 C) (04/06 1155) Temp Source: Oral (04/06 1155) BP: 113/89 (04/06 1330) Pulse Rate: 152 (04/06 1330)  Labs: Recent Labs    06/27/17 1200 06/27/17 1201  HGB 12.4  --   HCT 36.3  --   PLT 276  --   LABPROT  --  81.1*  INR  --  >10.00*  CREATININE 0.78  --   TROPONINI <0.03  --     Estimated Creatinine Clearance: 53 mL/min (by C-G formula based on SCr of 0.78 mg/dL).   Medical History: Past Medical History:  Diagnosis Date  . A-fib ([place])   . CHF (congestive heart failure) ([place])   . Hypertension   . Sleep apnea    Assessment: 77 y/o F with a h/o atrial fibrillation and congestive heart failure admitted to the ED for atrial fibrillation. Patient on warfarin 2/2.5 mg alternating dosing PTA. Patient admitted with INR > 10 and given vitamin K. No bleeding reported. Patient received antibiotics  recently.    Goal of Therapy:  INR 2-3   Plan:  Will hold warfarin and continue to follow daily INR.   Ulice Dash D 06/27/2017,3:01 PM

## 2017-06-27 NOTE — ED Notes (Signed)
Attempted to give report, RN will call me back

## 2017-06-27 NOTE — ED Triage Notes (Signed)
Pt states she was seen Thursday for constipation. Was disimpacted. Several BM's Thursday and Friday but none today. Pt was at Dr. Marlan Palau and was told possibly in a fib. Takes diltiazem for a fib. Pt wears 2 L nasal cannula chronically. Thinks is constipated again. C/o rectal pain. Denies N&V. Denies pain anywhere else.

## 2017-06-27 NOTE — Progress Notes (Signed)
Patient: Hannah Faulkner Female    DOB: Mar 22, 1941   77 y.o.   MRN: [id number] Visit Date: 06/27/2017  Today's Provider: Wilhemena Durie, MD   Chief Complaint  Patient presents with  . Constipation   Subjective:    HPI Patient here today C/O no bowel movement since last Friday. Patient was seen at [facility] ER due to fecal impaction. Patient was treated and was sent home with Glycerin as needed. Patient reports she has used 3 since last bowel movement. Patient reports she has used several OTC medications and reports no improvement with medications. Family frustrated and thinks she needs fluids.Frustrated as she is not getting her strength back. No fevers,abdominal pain. Less rectal pain than 2 weeks ago.    Allergies  Allergen Reactions  . Ampicillin   . Prinzide  [Lisinopril-Hydrochlorothiazide]   . Penicillins Rash    .Has patient had a PCN reaction causing immediate rash, facial/tongue/throat swelling, SOB or lightheadedness with hypotension: Unknown Has patient had a PCN reaction causing severe rash involving mucus membranes or skin necrosis: No Has patient had a PCN reaction that required hospitalization: No Has patient had a PCN reaction occurring within the last 10 years: No If all of the above answers are "NO", then may proceed with Cephalosporin use.      Current Outpatient Medications:  .  ALPRAZolam (XANAX) 0.5 MG tablet, TAKE 1 TABLET BY MOUTH AT BEDTIME AS NEEDED, Disp: 30 tablet, Rfl: 1 .  cyanocobalamin 1000 MCG tablet, Take 1,000 mcg by mouth daily., Disp: , Rfl:  .  diltiazem (CARDIZEM) 120 MG tablet, Take 120 mg by mouth 2 times daily at 12 noon and 4 pm. , Disp: , Rfl:  .  flecainide (TAMBOCOR) 50 MG tablet, Take 50 mg by mouth 2 (two) times daily. , Disp: , Rfl:  .  gabapentin (NEURONTIN) 100 MG capsule, TAKE 1 CAPSULE (100 MG TOTAL) BY MOUTH 3 (THREE) TIMES DAILY., Disp: 270 capsule, Rfl: 2 .  glycerin adult 2 g suppository, Place 1 suppository rectally  as needed for constipation., Disp: 12 suppository, Rfl: 0 .  magnesium oxide (MAG-OX) 400 MG tablet, Take 400 mg by mouth daily., Disp: , Rfl:  .  metoprolol tartrate (LOPRESSOR) 100 MG tablet, Take 1 tablet (100 mg total) by mouth 2 (two) times daily., Disp: 60 tablet, Rfl: 3 .  omeprazole (PRILOSEC) 20 MG capsule, Take 1 capsule (20 mg total) by mouth at bedtime., Disp: 30 capsule, Rfl: 11 .  polyethylene glycol powder (GLYCOLAX/MIRALAX) powder, Take 17 g by mouth 2 (two) times daily as needed., Disp: 3350 g, Rfl: 1 .  potassium chloride SA (K-DUR,KLOR-CON) 20 MEQ tablet, Take 2 tablets (40 mEq total) by mouth 2 (two) times daily. And an additional 68meq ever Wednesday with metolazone, Disp: 124 tablet, Rfl: 5 .  prochlorperazine (COMPAZINE) 10 MG tablet, Take 1 tablet (10 mg total) by mouth every 8 (eight) hours as needed for nausea or vomiting., Disp: 30 tablet, Rfl: 1 .  Pyridoxine HCl (VITAMIN B6) 200 MG TABS, Take 200 tablets by mouth 2 (two) times daily. , Disp: , Rfl:  .  torsemide (DEMADEX) 20 MG tablet, Take 2 tablets (40 mg total) by mouth 2 (two) times daily., Disp: 120 tablet, Rfl: 5 .  warfarin (COUMADIN) 2 MG tablet, Take 2 mg by mouth every other day. Take 2mg  one day and take 2.5mg  next day, alternating., Disp: , Rfl:  .  warfarin (COUMADIN) 2.5 MG tablet, Take 2.5  mg by mouth every other day. Take 2mg  one day and take 2.5mg  next day, alternating., Disp: , Rfl:  .  albuterol (PROVENTIL HFA;VENTOLIN HFA) 108 (90 Base) MCG/ACT inhaler, Inhale 2 puffs into the lungs every 6 (six) hours as needed for wheezing or shortness of breath., Disp: , Rfl:  .  hydrocortisone (ANUSOL-HC) 25 MG suppository, Place 1 suppository (25 mg total) rectally 2 (two) times daily. (Patient not taking: Reported on 06/27/2017), Disp: 12 suppository, Rfl: 0 .  metolazone (ZAROXOLYN) 2.5 MG tablet, Take 1 tablet (2.5 mg total) by mouth once a week., Disp: 4 tablet, Rfl: 6 .  senna-docusate (SENOKOT-S) 8.6-50 MG  tablet, Take 1 tablet by mouth daily. (Patient not taking: Reported on 06/27/2017), Disp: 30 tablet, Rfl: 12  Review of Systems  Constitutional: Positive for fatigue.  HENT: Negative.   Eyes: Negative.   Respiratory: Negative.   Cardiovascular: Negative.   Gastrointestinal: Positive for abdominal pain and constipation.  Endocrine: Negative.   Genitourinary: Negative.   Allergic/Immunologic: Negative.   Neurological: Negative.   Psychiatric/Behavioral: Negative.     Social History   Tobacco Use  . Smoking status: Never Smoker  . Smokeless tobacco: Never Used  Substance Use Topics  . Alcohol use: No   Objective:   BP 120/80 (BP Location: Left Arm, Patient Position: Sitting, Cuff Size: Large)   Pulse (!) 110   Temp 97.9 F (36.6 C) (Oral)   Resp 16   SpO2 96% Comment: 2 L oxygen Vitals:   06/27/17 1059  BP: 120/80  Pulse: (!) 110  Resp: 16  Temp: 97.9 F (36.6 C)  TempSrc: Oral  SpO2: 96%     Physical Exam  Constitutional: She is oriented to person, place, and time. She appears well-developed and well-nourished.  Weak WF NAD in wheelchair.  HENT:  Head: Normocephalic and atraumatic.  Eyes: No scleral icterus.  Neck: No thyromegaly present.  Cardiovascular: Normal rate.  Pulmonary/Chest: Effort normal and breath sounds normal.  Abdominal: Soft. Bowel sounds are normal. There is no tenderness.  Lymphadenopathy:    She has no cervical adenopathy.  Neurological: She is alert and oriented to person, place, and time.  Skin: Skin is warm and dry.        Assessment & Plan:     Ongoing Constipation No BM in 8 days but pt has not been taking glycolax daily.I offered to disimpact her in the office but pt/son are afraid it would hurt her too much without the option of Fentanykl she received before this was last done. Probably would benefit from enema.Son to take pt to ED. Afib HR today 100-140 in office. I think thiis is part of why pt is feeling  unwell. Dehydration Chronic CHF     I have done the exam and reviewed the above chart and it is accurate to the best of my knowledge. Development worker, community has been used in this note in any air is in the dictation or transcription are unintentional.  Wilhemena Durie, MD  [place]

## 2017-06-27 NOTE — Progress Notes (Signed)
Family Meeting Note  Advance Directive:yes  Today a meeting took place with the son (POA)   The following were discussed:Patient's diagnosis: it is being admitted with a fib with RVR. She is good to be started on IV diltiazem drip. She is constipated has UTI., Patient's progosis: stable  status discussed with patient's son and patient. The request full code  Time spent during discussion: 16 minutes Fritzi Mandes, MD

## 2017-06-27 NOTE — ED Provider Notes (Addendum)
[facility] Emergency Department Provider Note       Time seen: ----------------------------------------- 12:08 PM on 06/27/2017 -----------------------------------------   I have reviewed the triage vital signs and the nursing notes.  HISTORY   Chief Complaint Atrial Fibrillation and Rectal Pain    HPI Hannah Faulkner is a 77 y.o. female with a history of atrial fibrillation, CHF, hypertension and sleep apnea who presents to the ED for atrial fibrillation.  Patient was seen Thursday for constipation and was disimpacted.  She had several bowel movements Thursday and Friday but none today.  She was seen in Dr. Alben Spittle office and was told she was possibly in atrial fibrillation.  She takes diltiazem for A. fib and wears 2 L of oxygen chronically via nasal cannula.  She does complain of rectal pain, denies nausea and vomiting.  Past Medical History:  Diagnosis Date  . A-fib ([place])   . CHF (congestive heart failure) ([place])   . Hypertension   . Sleep apnea     Patient Active Problem List   Diagnosis Date Noted  . A-fib ([place]) 05/21/2017  . Hypokalemia 11/20/2016  . Lymphedema of both lower extremities 11/20/2016  . Peripheral edema 11/20/2016  . Chronic diastolic heart failure ([place]) 09/04/2016  . Restless legs 09/04/2016  . Fatigue 09/04/2016  . Sterile pyuria 08/31/2016  . Hyperglycemia 08/31/2016  . Acute diastolic heart failure ([place]) 08/26/2016  . Community acquired pneumonia 08/04/2016  . Tricuspid regurgitation 05/15/2015  . Mitral valve regurgitation 05/15/2015  . Anxiety 08/25/2014  . Atrial fibrillation, chronic ([place]) 08/25/2014  . Benign neoplasm of colon 08/25/2014  . 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 Diabetic neuropathy ([place]) 08/25/2014  . Essential hypertension 08/25/2014  . 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 Adiposity 08/25/2014  . Obstructive apnea 08/25/2014  . Hypercholesterolemia without hypertriglyceridemia 08/25/2014  .  B12 deficiency 08/25/2014  . H/O cardiac catheterization 07/15/2013  . Apnea, sleep 07/15/2013  . AF (paroxysmal atrial fibrillation) ([place]) 07/15/2013    Past Surgical History:  Procedure Laterality Date  . EYE SURGERY    . POLYPECTOMY     colon poly premoved    Allergies Ampicillin; Prinzide  [lisinopril-hydrochlorothiazide]; and Penicillins  Social History Social History   Tobacco Use  . Smoking status: Never Smoker  . Smokeless tobacco: Never Used  Substance Use Topics  . Alcohol use: No  . Drug use: No   Review of Systems Constitutional: Negative for fever. Eyes: Negative for vision changes ENT:  Negative for congestion, sore throat Cardiovascular: Negative for chest pain.  Positive for palpitations Respiratory: Negative for shortness of breath. Gastrointestinal: Negative for abdominal pain, vomiting and diarrhea.  For constipation and rectal pain Genitourinary: Negative for dysuria. Musculoskeletal: Negative for back pain. Skin: Negative for rash. Neurological: Negative for headaches, focal weakness or numbness.  All systems negative/normal/unremarkable except as stated in the HPI  ____________________________________________   PHYSICAL EXAM:  VITAL SIGNS: ED Triage Vitals  Enc Vitals Group     BP 06/27/17 1155 (!) 119/102     Pulse Rate 06/27/17 1155 76     Resp 06/27/17 1155 16     Temp 06/27/17 1155 98 F (36.7 C)     Temp Source 06/27/17 1155 Oral     SpO2 06/27/17 1155 96 %     Weight 06/27/17 1153 151 lb (68.5 kg)     Height 06/27/17 1153 5\' 1"  (1.549 m)     Head Circumference --      Peak Flow --  Pain Score 06/27/17 1153 7     Pain Loc --      Pain Edu? --      Excl. in [place]? --    Constitutional: Alert and oriented. Well appearing and in no distress. Eyes: Conjunctivae are normal. Normal extraocular movements. ENT   Head: Normocephalic and atraumatic.   Nose: No congestion/rhinnorhea.   Mouth/Throat: Mucous membranes are  moist.   Neck: No stridor. Cardiovascular: Rapid rate, irregular rhythm. No murmurs, rubs, or gallops. Respiratory: Normal respiratory effort without tachypnea nor retractions. Breath sounds are clear and equal bilaterally. No wheezes/rales/rhonchi. Gastrointestinal: Soft and nontender. Normal bowel sounds Rectal: Fecal impaction is noted Musculoskeletal: Nontender with normal range of motion in extremities. No lower extremity tenderness nor edema. Neurologic:  Normal speech and language. No gross focal neurologic deficits are appreciated.  Skin:  Skin is warm, dry and intact. No rash noted. Psychiatric: Mood and affect are normal. Speech and behavior are normal.  ____________________________________________  EKG: Interpreted by me.  Atrial fibrillation with rapid ventricular response, rate is 178 bpm, right axis deviation, low voltage QRS, borderline long QT  Repeat EKG interpreted by me, reveals persistent atrial fibrillation but improved right, right axis deviation, low voltage____________________________________________  ED COURSE:  As part of my medical decision making, I reviewed the following data within the [place] History obtained from family if available, nursing notes, old chart and ekg, as well as notes from prior ED visits. Patient presented for rapid atrial fibrillation, we will assess with labs and imaging as indicated at this time.   Procedures ____________________________________________   LABS (pertinent positives/negatives)  Labs Reviewed  BASIC METABOLIC PANEL - Abnormal; Notable for the following components:      Result Value   Potassium 5.2 (*)    Glucose, Bld 148 (*)    All other components within normal limits  CBC - Abnormal; Notable for the following components:   RDW 18.0 (*)    All other components within normal limits  PROTIME-INR - Abnormal; Notable for the following components:   Prothrombin Time 81.1 (*)    INR >10.00 (*)     All other components within normal limits  URINALYSIS, COMPLETE (UACMP) WITH MICROSCOPIC - Abnormal; Notable for the following components:   Color, Urine YELLOW (*)    APPearance CLOUDY (*)    Leukocytes, UA LARGE (*)    Bacteria, UA RARE (*)    Squamous Epithelial / LPF 6-30 (*)    All other components within normal limits  URINE CULTURE  TROPONIN I    RADIOLOGY Images were viewed by me  Chest x-ray IMPRESSION: 1. New right basilar airspace opacity concerning for a combination of pleural effusion with atelectasis and/or infiltrate. 2. Mildly increased left basilar atelectasis. 3. Stable cardiomegaly. IMPRESSION: 1. Small effusion and underlying opacity in the right lung base. 2. No other acute abnormalities.  ____________________________________________ CRITICAL CARE Performed by: Laurence Aly   Total critical care time: 30 minutes  Critical care time was exclusive of separately billable procedures and treating other patients.  Critical care was necessary to treat or prevent imminent or life-threatening deterioration.  Critical care was time spent personally by me on the following activities: development of treatment plan with patient and/or surrogate as well as nursing, discussions with consultants, evaluation of patient's response to treatment, examination of patient, obtaining history from patient or surrogate, ordering and performing treatments and interventions, ordering and review of laboratory studies, ordering and review of radiographic studies, pulse oximetry  and re-evaluation of patient's condition.   DIFFERENTIAL DIAGNOSIS   Dehydration, electrolyte abnormality, medication noncompliance, MI, unstable angina  FINAL ASSESSMENT AND PLAN  Atrial fibrillation with rapid ventricular response, fecal impaction, elevated INR   Plan: The patient had presented for rapid A. fib and constipation. Patient's labs are grossly unremarkable with exception of a  markedly elevated INR.  It is unclear why her INR is so elevated.  She did receive IV Cardizem and oral Cardizem for rate control of acute on chronic atrial fibrillation. Patient's imaging revealed a new right basilar airspace opacity.  In light of the above findings, she was given vitamin K orally, we have ordered an enema and I was able to manually disimpact her some.  She also has a UTI, I have ordered a urine culture and IV Rocephin.   Laurence Aly, MD   Note: This note was generated in part or whole with voice recognition software. Voice recognition is usually quite accurate but there are transcription errors that can and very often do occur. I apologize for any typographical errors that were not detected and corrected.     Earleen Newport, MD 06/27/17 1401    Earleen Newport, MD 07/08/17 [phone]

## 2017-06-28 ENCOUNTER — Encounter: Payer: Self-pay | Admitting: *Deleted

## 2017-06-28 LAB — PROTIME-INR
INR: 6.88 — AB
Prothrombin Time: 59.1 seconds — ABNORMAL HIGH (ref 11.4–15.2)

## 2017-06-28 LAB — URINE CULTURE: SPECIAL REQUESTS: NORMAL

## 2017-06-28 LAB — TROPONIN I: Troponin I: <0.03 ng/mL (ref <0.03)

## 2017-06-28 MED ORDER — WARFARIN - PHARMACIST DOSING INPATIENT: Freq: Every day | Status: DC

## 2017-06-28 NOTE — Progress Notes (Signed)
Pt's heart rate on monitor a fib in 60's. And has been all am. Dr Ubaldo Glassing notified. Diltiazem gtt d.c'd.

## 2017-06-28 NOTE — Progress Notes (Signed)
[facility] for Warfarin Indication: atrial fibrillation  Allergies  Allergen Reactions  . Ampicillin   . Prinzide  [Lisinopril-Hydrochlorothiazide]   . Penicillins Rash    .Has patient had a PCN reaction causing immediate rash, facial/tongue/throat swelling, SOB or lightheadedness with hypotension: Unknown Has patient had a PCN reaction causing severe rash involving mucus membranes or skin necrosis: No Has patient had a PCN reaction that required hospitalization: No Has patient had a PCN reaction occurring within the last 10 years: No If all of the above answers are "NO", then may proceed with Cephalosporin use.     Patient Measurements: Height: 5\' 1"  (154.9 cm) Weight: 160 lb 4.8 oz (72.7 kg) IBW/kg (Calculated) : 47.8  Vital Signs: Temp: 97.8 F (36.6 C) (04/07 0808) Temp Source: Oral (04/07 0808) BP: 103/61 (04/07 0808) Pulse Rate: 77 (04/07 0808)  Labs: Recent Labs    06/27/17 1200 06/27/17 1201 06/27/17 1827 06/27/17 2328 06/28/17 0631  HGB 12.4  --   --   --   --   HCT 36.3  --   --   --   --   PLT 276  --   --   --   --   LABPROT  --  81.1*  --   --  59.1*  INR  --  >10.00*  --   --  6.88*  CREATININE 0.78  --   --   --   --   TROPONINI <0.03  --  <0.03 <0.03 <0.03    Estimated Creatinine Clearance: 54.6 mL/min (by C-G formula based on SCr of 0.78 mg/dL).   Medical History: Past Medical History:  Diagnosis Date  . A-fib ([place])   . CHF (congestive heart failure) ([place])   . Hypertension   . Sleep apnea    Assessment: 77 y/o F with a h/o atrial fibrillation and congestive heart failure admitted to the ED for atrial fibrillation. Patient on warfarin 2/2.5 mg alternating dosing PTA. Patient admitted with INR > 10 and given vitamin K 5 mg po x 1 4/6. No bleeding reported. Patient received antibiotics  recently.   4/6  INR > 10   Dose HELD 4/7  INR 6.88   Dose HELD   Goal of Therapy:  INR 2-3   Plan:  Will hold  warfarin and continue to follow daily INR.  Patient on antibiotic   Ahmadou Bolz A 06/28/2017,10:29 AM

## 2017-06-28 NOTE — Consult Note (Signed)
Cardiology Consultation Note    Patient ID: Hannah Faulkner, MRN: [id number], DOB/AGE: 1940-04-22 77 y.o. Admit date: 06/27/2017   Date of Consult: 06/28/2017 Primary Physician: Jerrol Banana., MD Primary Cardiologist: Dr. Saralyn Pilar  Chief Complaint: rectal pain and constipation Reason for Consultation: afib Requesting MD: Dr. Estanislado Pandy  HPI: Hannah Faulkner is a 77 y.o. female with history of atrial fibrillation anticoagulated with warfarin, history of hypertension, history of sleep apnea who has been treated as an outpatient with flecainide A and diltiazem Toprol.  She is on fairly aggressive diarrhea and reticulocyte therapy including metolazone, torsemide.  She presented to the emergency room complaining of rectal pain and constipation.  Was noted on electrocardiogram to have atrial fibrillation with rapid ventricular response.  She was given IV Cardizem and continued with her Cardizem CD 120 mg daily and continue with flecainide 50 mg twice daily.  Her heart rate has improved.  Serum troponin drawn as per protocol was normal.  Chest x-ray revealed small pleural effusion in the right lung base with no pulmonary edema.  EKG on admission revealed atrial fibrillation rapid ventricular response.  Heart rate currently is 70-90.  Magnesium was normal.  BC showed no anemia with a normal hemoglobin and hematocrit and normal white blood cell count.  Serum potassium was upper limits of normal at 5.1 and 5.2.  Function was normal.  INR is markedly elevated greater than 10.  She is given vitamin K x1 and further INR is pending.  No obvious bleeding    Past Medical History:  Diagnosis Date  . A-fib ([place])   . CHF (congestive heart failure) ([place])   . Hypertension   . Sleep apnea       Surgical History:  Past Surgical History:  Procedure Laterality Date  . EYE SURGERY    . POLYPECTOMY     colon poly premoved     Home Meds: Prior to Admission medications   Medication Sig Start Date End Date  Taking? Authorizing Provider  ALPRAZolam Duanne Moron) 0.5 MG tablet TAKE 1 TABLET BY MOUTH AT BEDTIME AS NEEDED 05/11/17  Yes Jerrol Banana., MD  cyanocobalamin 1000 MCG tablet Take 1,000 mcg by mouth daily.   Yes [provider]  diltiazem (CARDIZEM) 120 MG tablet Take 120 mg by mouth 2 times daily at 12 noon and 4 pm.    Yes [provider]  flecainide (TAMBOCOR) 50 MG tablet Take 50 mg by mouth 2 (two) times daily.  11/16/13  Yes [provider]  gabapentin (NEURONTIN) 100 MG capsule TAKE 1 CAPSULE (100 MG TOTAL) BY MOUTH 3 (THREE) TIMES DAILY. 12/21/16  Yes Jerrol Banana., MD  glycerin adult 2 g suppository Place 1 suppository rectally as needed for constipation. 06/18/17  Yes Merlyn Lot, MD  magnesium oxide (MAG-OX) 400 MG tablet Take 400 mg by mouth daily.   Yes [provider]  metolazone (ZAROXOLYN) 2.5 MG tablet Take 1 tablet (2.5 mg total) by mouth once a week. 05/15/17 06/27/17 Yes Darylene Price A, FNP  metoprolol tartrate (LOPRESSOR) 100 MG tablet Take 1 tablet (100 mg total) by mouth 2 (two) times daily. 08/31/16  Yes Theodoro Grist, MD  omeprazole (PRILOSEC) 20 MG capsule Take 1 capsule (20 mg total) by mouth at bedtime. 04/15/17  Yes Jerrol Banana., MD  polyethylene glycol powder [facility]) powder Take 17 g by mouth 2 (two) times daily as needed. 06/16/17  Yes Jerrol Banana., MD  potassium  chloride SA (K-DUR,KLOR-CON) 20 MEQ tablet Take 2 tablets (40 mEq total) by mouth 2 (two) times daily. And an additional 66meq ever Wednesday with metolazone 03/09/17  Yes Darylene Price A, FNP  Pyridoxine HCl (VITAMIN B6) 200 MG TABS Take 200 tablets by mouth 2 (two) times daily.  11/16/13  Yes [provider]  senna-docusate (SENOKOT-S) 8.6-50 MG tablet Take 1 tablet by mouth daily. 06/16/17  Yes Jerrol Banana., MD  torsemide (DEMADEX) 20 MG tablet Take 2 tablets (40 mg total) by mouth 2 (two) times daily. 05/15/17  Yes  Darylene Price A, FNP  warfarin (COUMADIN) 2 MG tablet Take 2 mg by mouth every other day. Take 2mg  one day and take 2.5mg  next day, alternating.   Yes [provider]  warfarin (COUMADIN) 2.5 MG tablet Take 2.5 mg by mouth every other day. Take 2mg  one day and take 2.5mg  next day, alternating. 11/16/13  Yes [provider]  albuterol (PROVENTIL HFA;VENTOLIN HFA) 108 (90 Base) MCG/ACT inhaler Inhale 2 puffs into the lungs every 6 (six) hours as needed for wheezing or shortness of breath.    [provider]  prochlorperazine (COMPAZINE) 10 MG tablet Take 1 tablet (10 mg total) by mouth every 8 (eight) hours as needed for nausea or vomiting. 09/05/16   Birdie Sons, MD    Inpatient Medications:  . cephALEXin  500 mg Oral Q12H  . diltiazem  120 mg Oral Daily  . flecainide  50 mg Oral BID  . gabapentin  100 mg Oral TID  . magnesium oxide  400 mg Oral Daily  . metolazone  2.5 mg Oral Weekly  . metoprolol tartrate  100 mg Oral BID  . pantoprazole  40 mg Oral Daily  . polyethylene glycol  17 g Oral BID  . pyridOXINE  200 mg Oral BID  . senna-docusate  2 tablet Oral Daily  . torsemide  40 mg Oral BID  . cyanocobalamin  1,000 mcg Oral Daily   . diltiazem (CARDIZEM) infusion 10 mg/hr (06/28/17 0433)    Allergies:  Allergies  Allergen Reactions  . Ampicillin   . Prinzide  [Lisinopril-Hydrochlorothiazide]   . Penicillins Rash    .Has patient had a PCN reaction causing immediate rash, facial/tongue/throat swelling, SOB or lightheadedness with hypotension: Unknown Has patient had a PCN reaction causing severe rash involving mucus membranes or skin necrosis: No Has patient had a PCN reaction that required hospitalization: No Has patient had a PCN reaction occurring within the last 10 years: No If all of the above answers are "NO", then may proceed with Cephalosporin use.     Social History   Socioeconomic History  . Marital status: Widowed    Spouse name: Not  on file  . Number of children: 3  . Years of education: HS  . Highest education level: 12th grade  Occupational History    Employer: RETIRED  Social Needs  . Financial resource strain: Not hard at all  . Food insecurity:    Worry: Never true    Inability: Never true  . Transportation needs:    Medical: Yes    Non-medical: Yes  Tobacco Use  . Smoking status: Never Smoker  . Smokeless tobacco: Never Used  Substance and Sexual Activity  . Alcohol use: No  . Drug use: No  . Sexual activity: Not on file  Lifestyle  . Physical activity:    Days per week: 0 days    Minutes per session: 0 min  .  Stress: Only a little  Relationships  . Social connections:    Talks on phone: More than three times a week    Gets together: More than three times a week    Attends religious service: More than 4 times per year    Active member of club or organization: No    Attends meetings of clubs or organizations: Never    Relationship status: Widowed  . Intimate partner violence:    Fear of current or ex partner: Patient refused    Emotionally abused: Patient refused    Physically abused: Patient refused    Forced sexual activity: Patient refused  Other Topics Concern  . Not on file  Social History Narrative  . Not on file     Family History  Problem Relation Age of Onset  . Hypertension Mother   . Heart disease Mother   . CVA Mother   . Heart attack Mother   . Lung cancer Father   . Heart attack Sister   . CVA Sister   . Thyroid disease Sister   . Liver cancer Brother   . Thyroid disease Sister   . Thyroid disease Sister   . Diabetes Sister   . Dementia Sister   . Atrial fibrillation Sister      Review of Systems: A 12-system review of systems was performed and is negative except as noted in the HPI.  Labs: Recent Labs    06/27/17 1200 06/27/17 1827 06/27/17 2328 06/28/17 0631  TROPONINI <0.03 <0.03 <0.03 <0.03   Lab Results  Component Value Date   WBC 8.7 06/27/2017    HGB 12.4 06/27/2017   HCT 36.3 06/27/2017   MCV 86.1 06/27/2017   PLT 276 06/27/2017    Recent Labs  Lab 06/27/17 1200  NA 136  K 5.2*  CL 108  CO2 22  BUN 19  CREATININE 0.78  CALCIUM 9.3  GLUCOSE 148*   Lab Results  Component Value Date   CHOL 148 11/27/2015   HDL 42 11/27/2015   LDLCALC 84 11/27/2015   TRIG 109 11/27/2015   No results found for: DDIMER  Radiology/Studies:  Dg Chest 2 View  Result Date: 06/15/2017 CLINICAL DATA:  History of recent pneumonia, follow-up, persistent cough EXAM: CHEST - 2 VIEW COMPARISON:  Portable chest x-ray of 05/23/2017 FINDINGS: No active infiltrate or effusion is seen. The lungs remain slightly hyperaerated. Mild cardiomegaly is stable. No acute bony abnormality is noted. IMPRESSION: No pneumonia or effusion.  Stable mild cardiomegaly. Electronically Signed   By: Ivar Drape M.D.   On: 06/15/2017 17:16   Dg Chest Port 1 View  Result Date: 06/27/2017 CLINICAL DATA:  77 year old female with rectal pain, recent fecal disimpaction procedure EXAM: PORTABLE CHEST 1 VIEW COMPARISON:  Prior chest x-ray 06/15/2017 FINDINGS: Interval development of patchy airspace opacity obscuring the right cardiac margin and the right diaphragm. Suspect a layering pleural effusion is well. Linear opacities in the left lateral lung base likely reflect atelectasis. Stable cardiomegaly. Atherosclerotic calcifications are present in the transverse aorta. The upper lungs are well aerated and clear. No pneumothorax. No acute osseous abnormality. IMPRESSION: 1. New right basilar airspace opacity concerning for a combination of pleural effusion with atelectasis and/or infiltrate. 2. Mildly increased left basilar atelectasis. 3. Stable cardiomegaly. Electronically Signed   By: Jacqulynn Cadet M.D.   On: 06/27/2017 13:03   Dg Abd 2 Views  Result Date: 06/27/2017 CLINICAL DATA:  Rectal pain today. EXAM: ABDOMEN - 2 VIEW COMPARISON:  None.  FINDINGS: There is a small effusion  and underlying atelectasis in the right base. No free air, portal venous gas, or pneumatosis. The bowel gas pattern is nonobstructive. Calcifications in the pelvis are likely phleboliths. No other acute abnormalities. IMPRESSION: 1. Small effusion and underlying opacity in the right lung base. 2. No other acute abnormalities. Electronically Signed   By: Dorise Bullion III M.D   On: 06/27/2017 13:03   Ct Renal Stone Study  Result Date: 06/18/2017 CLINICAL DATA:  Lower abdominal pain and constipation. EXAM: CT ABDOMEN AND PELVIS WITHOUT CONTRAST TECHNIQUE: Multidetector CT imaging of the abdomen and pelvis was performed following the standard protocol without IV contrast. COMPARISON:  None. FINDINGS: Lower chest: 5 mm aggregate nodule in the right middle lobe (series 5, image 6). Small right and trace left pleural effusions with mild interlobular septal thickening at the lung bases. Scarring/atelectasis in the lingula. Cardiomegaly. Hepatobiliary: No focal liver abnormality. Mild gallbladder distention without wall thickening or biliary dilatation. Pancreas: Unremarkable. No pancreatic ductal dilatation or surrounding inflammatory changes. Spleen: Normal in size without focal abnormality. Adrenals/Urinary Tract: Mild thickening of the left adrenal gland without discrete nodule. The right adrenal gland is unremarkable. No renal or ureteral calculi. No hydronephrosis. Moderate distention of the bladder. Stomach/Bowel: Large stool ball within the rectum and distal sigmoid colon with mild surrounding inflammatory changes. Moderate stool burden throughout the remaining colon. Normal appendix. The stomach and small bowel are unremarkable. Vascular/Lymphatic: No significant vascular findings are present. No enlarged abdominal or pelvic lymph nodes. Reproductive: Uterus and bilateral adnexa are unremarkable for the patient's age. Other: No free fluid or pneumoperitoneum. Musculoskeletal: No acute or significant osseous  findings. Degenerative changes of the thoracolumbar spine. IMPRESSION: 1. Large stool ball within the rectum and distal sigmoid colon with mild surrounding inflammatory changes, consistent with stercolitis. Moderate stool burden throughout the remaining colon. 2. Moderate distention of the bladder. 3. Small right and trace left pleural effusions with mild interstitial edema at the lung bases. 4. 5 mm nodule in the right middle lobe. No follow-up needed if patient is low-risk. Non-contrast chest CT can be considered in 12 months if patient is high-risk. This recommendation follows the consensus statement: Guidelines for Management of Incidental Pulmonary Nodules Detected on CT Images: From the Fleischner Society 2017; Radiology 2017; 284:228-243. Electronically Signed   By: Titus Dubin M.D.   On: 06/18/2017 12:18    Wt Readings from Last 3 Encounters:  06/28/17 72.7 kg (160 lb 4.8 oz)  06/18/17 68.5 kg (151 lb)  05/21/17 76.2 kg (167 lb 14.4 oz)    EKG: Fibrillation with rapid ventricular response.  Physical Exam:  Blood pressure (!) 137/56, pulse 61, temperature 98.2 F (36.8 C), temperature source Oral, resp. rate 18, height 5\' 1"  (1.549 m), weight 72.7 kg (160 lb 4.8 oz), SpO2 93 %. Body mass index is 30.29 kg/m. General: Well developed, well nourished, in no acute distress. Head: Normocephalic, atraumatic, sclera non-icteric, no xanthomas, nares are without discharge.  Neck: Negative for carotid bruits. JVD not elevated. Lungs: Clear bilaterally to auscultation without wheezes, rales, or rhonchi. Breathing is unlabored. Heart: Irregularly irregular rhythm with S1 S2. No murmurs, rubs, or gallops appreciated. Abdomen: Soft and  non-distended with normoactive bowel sounds. No hepatomegaly. No rebound/guarding. No obvious abdominal masses.  Complaints of rectal pain and lower abdominal discomfort. Msk:  Strength and tone appear normal for age. Extremities: No clubbing or cyanosis. No edema.   Distal pedal pulses are 2+ and equal bilaterally. Neuro: Alert  and oriented X 3. No facial asymmetry. No focal deficit. Moves all extremities spontaneously. Psych:  Responds to questions appropriately with a normal affect.     Assessment and Plan  77 year old female with history of chronic atrial fibrillation treated with rate control with Cardizem and metoprolol is also on flecainide currently and warfarin.  She presented to the ER with rectal pain and EKG showed atrial fibrillation rapid ventricular response.  Also of note the patient's INR was greater than 10.  She reports being on an antibiotic as an outpatient.  She denies any bleeding.  Her rectal pain is improved.  Her heart rate has improved.  Would continue to remain off of warfarin and continue to dose vitamin K as needed to get reduce INR.  No obvious bleeding at present.  At this morning is 6.88.  No evidence of bleeding.  Would allow her INR to prove with her INR goal between 2 and 3.  Continue to treat her constipation and continue with Cardizem and metoprolol as well as flecainide.  We will follow with you.  Signed, Teodoro Spray MD 06/28/2017, 9:15 AM Pager: [phone]

## 2017-06-28 NOTE — Progress Notes (Signed)
PT Cancellation Note  Patient Details Name: Hannah Faulkner MRN: [id number] DOB: 03-18-1941   Cancelled Treatment:    Reason Eval/Treat Not Completed: Medical issues which prohibited therapy(Consult received and chart reviewed.  Patient noted with critically elevated INR (6.88); contraindicated for exertional activity/mobility at this time.  Will continue efforts next date as patient medically appropriate.)   Govind Furey H. Owens Shark, PT, DPT, NCS 06/28/17, 10:06 AM [phone]

## 2017-06-28 NOTE — Progress Notes (Signed)
[place] at [facility]                                                                                                                                                                                  Patient Demographics   Hannah Faulkner, is a 77 y.o. female, DOB - [birth year], TDD:[id number]  Admit date - 06/27/2017   Admitting Physician Fritzi Mandes, MD  Outpatient Primary MD for the patient is Jerrol Banana., MD   LOS - 1  Subjective: Patient seen and evaluated by me today No complaints of any chest pain Rectal pain is decreased No hematemesis, hemoptysis No evidence of any rectal bleed  Review of Systems:   CONSTITUTIONAL: No documented fever. No fatigue, weakness. No weight gain, no weight loss.  EYES: No blurry or double vision.  ENT: No tinnitus. No postnasal drip. No redness of the oropharynx.  RESPIRATORY: No cough, no wheeze, no hemoptysis. No dyspnea.  CARDIOVASCULAR: No chest pain. No orthopnea. No palpitations. No syncope.  GASTROINTESTINAL: No nausea, no vomiting or diarrhea. No abdominal pain. No melena or hematochezia.  GENITOURINARY: No dysuria or hematuria.  ENDOCRINE: No polyuria or nocturia. No heat or cold intolerance.  HEMATOLOGY: No anemia. No bruising. No bleeding.  INTEGUMENTARY: No rashes. No lesions.  MUSCULOSKELETAL: No arthritis. No swelling. No gout.  NEUROLOGIC: No numbness, tingling, or ataxia. No seizure-type activity.  PSYCHIATRIC: No anxiety. No insomnia. No ADD.    Vitals:   Vitals:   06/27/17 2002 06/27/17 2319 06/28/17 0331 06/28/17 0808  BP: 92/68 113/72 (!) 137/56 103/61  Pulse: (!) 105 62 61 77  Resp: 18  18 20   Temp: 98 F (36.7 C)  98.2 F (36.8 C) 97.8 F (36.6 C)  TempSrc: Oral  Oral Oral  SpO2: 94% 93% 93% 95%  Weight:   72.7 kg (160 lb 4.8 oz)   Height:        Wt Readings from Last 3 Encounters:  06/28/17 72.7 kg (160 lb 4.8 oz)  06/18/17 68.5 kg (151 lb)  05/21/17 76.2 kg (167 lb 14.4 oz)      Intake/Output Summary (Last 24 hours) at 06/28/2017 1107 Last data filed at 06/28/2017 1001 Gross per 24 hour  Intake 236.83 ml  Output 1850 ml  Net -1613.17 ml    Physical Exam:   GENERAL: Pleasant-appearing elderly female patient in no apparent distress.  HEAD, EYES, EARS, NOSE AND THROAT: Atraumatic, normocephalic. Extraocular muscles are intact. Pupils equal and reactive to light. Sclerae anicteric. No conjunctival injection. No oro-pharyngeal erythema.  NECK: Supple. There is no jugular venous distention. No bruits, no lymphadenopathy, no thyromegaly.  HEART: S1-S2 irregular,. No murmurs,  no rubs, no clicks.  LUNGS: Clear to auscultation bilaterally. No rales or rhonchi. No wheezes.  ABDOMEN: Soft, flat, nontender, nondistended. Has good bowel sounds. No hepatosplenomegaly appreciated.  EXTREMITIES: No evidence of any cyanosis, clubbing, or peripheral edema.  +2 pedal and radial pulses bilaterally.  NEUROLOGIC: The patient is alert, awake, and oriented x3 with no focal motor or sensory deficits appreciated bilaterally.  SKIN: Moist and warm with no rashes appreciated.  Psych: Not anxious, depressed LN: No inguinal LN enlargement    Antibiotics   Anti-infectives (From admission, onward)   Start     Dose/Rate Route Frequency Ordered Stop   06/28/17 1000  cephALEXin (KEFLEX) capsule 500 mg     500 mg Oral Every 12 hours 06/27/17 1724     06/27/17 1400  cefTRIAXone (ROCEPHIN) 1 g in sodium chloride 0.9 % 100 mL IVPB     1 g 200 mL/hr over 30 Minutes Intravenous  Once 06/27/17 1357 06/27/17 1511      Medications   Scheduled Meds: . cephALEXin  500 mg Oral Q12H  . diltiazem  120 mg Oral Daily  . flecainide  50 mg Oral BID  . gabapentin  100 mg Oral TID  . magnesium oxide  400 mg Oral Daily  . metolazone  2.5 mg Oral Weekly  . metoprolol tartrate  100 mg Oral BID  . pantoprazole  40 mg Oral Daily  . polyethylene glycol  17 g Oral BID  . pyridOXINE  200 mg Oral BID  .  senna-docusate  2 tablet Oral Daily  . torsemide  40 mg Oral BID  . cyanocobalamin  1,000 mcg Oral Daily  . [START ON 06/29/2017] Warfarin - Pharmacist Dosing Inpatient   Does not apply q1800   Continuous Infusions: . diltiazem (CARDIZEM) infusion 10 mg/hr (06/28/17 0433)   PRN Meds:.acetaminophen **OR** acetaminophen, albuterol, ALPRAZolam, ondansetron **OR** ondansetron (ZOFRAN) IV   Data Review:   Micro Results Recent Results (from the past 240 hour(s))  Urine Culture     Status: None   Collection Time: 06/23/17  8:30 AM  Result Value Ref Range Status   Urine Culture, Routine Final report  Final   Organism ID, Bacteria Comment  Final    Comment: Mixed urogenital flora Greater than 100,000 colony forming units per mL     Radiology Reports Dg Chest 2 View  Result Date: 06/15/2017 CLINICAL DATA:  History of recent pneumonia, follow-up, persistent cough EXAM: CHEST - 2 VIEW COMPARISON:  Portable chest x-ray of 05/23/2017 FINDINGS: No active infiltrate or effusion is seen. The lungs remain slightly hyperaerated. Mild cardiomegaly is stable. No acute bony abnormality is noted. IMPRESSION: No pneumonia or effusion.  Stable mild cardiomegaly. Electronically Signed   By: Ivar Drape M.D.   On: 06/15/2017 17:16   Dg Chest Port 1 View  Result Date: 06/27/2017 CLINICAL DATA:  77 year old female with rectal pain, recent fecal disimpaction procedure EXAM: PORTABLE CHEST 1 VIEW COMPARISON:  Prior chest x-ray 06/15/2017 FINDINGS: Interval development of patchy airspace opacity obscuring the right cardiac margin and the right diaphragm. Suspect a layering pleural effusion is well. Linear opacities in the left lateral lung base likely reflect atelectasis. Stable cardiomegaly. Atherosclerotic calcifications are present in the transverse aorta. The upper lungs are well aerated and clear. No pneumothorax. No acute osseous abnormality. IMPRESSION: 1. New right basilar airspace opacity concerning for a  combination of pleural effusion with atelectasis and/or infiltrate. 2. Mildly increased left basilar atelectasis. 3. Stable cardiomegaly. Electronically Signed  By: Jacqulynn Cadet M.D.   On: 06/27/2017 13:03   Dg Abd 2 Views  Result Date: 06/27/2017 CLINICAL DATA:  Rectal pain today. EXAM: ABDOMEN - 2 VIEW COMPARISON:  None. FINDINGS: There is a small effusion and underlying atelectasis in the right base. No free air, portal venous gas, or pneumatosis. The bowel gas pattern is nonobstructive. Calcifications in the pelvis are likely phleboliths. No other acute abnormalities. IMPRESSION: 1. Small effusion and underlying opacity in the right lung base. 2. No other acute abnormalities. Electronically Signed   By: Dorise Bullion III M.D   On: 06/27/2017 13:03   Ct Renal Stone Study  Result Date: 06/18/2017 CLINICAL DATA:  Lower abdominal pain and constipation. EXAM: CT ABDOMEN AND PELVIS WITHOUT CONTRAST TECHNIQUE: Multidetector CT imaging of the abdomen and pelvis was performed following the standard protocol without IV contrast. COMPARISON:  None. FINDINGS: Lower chest: 5 mm aggregate nodule in the right middle lobe (series 5, image 6). Small right and trace left pleural effusions with mild interlobular septal thickening at the lung bases. Scarring/atelectasis in the lingula. Cardiomegaly. Hepatobiliary: No focal liver abnormality. Mild gallbladder distention without wall thickening or biliary dilatation. Pancreas: Unremarkable. No pancreatic ductal dilatation or surrounding inflammatory changes. Spleen: Normal in size without focal abnormality. Adrenals/Urinary Tract: Mild thickening of the left adrenal gland without discrete nodule. The right adrenal gland is unremarkable. No renal or ureteral calculi. No hydronephrosis. Moderate distention of the bladder. Stomach/Bowel: Large stool ball within the rectum and distal sigmoid colon with mild surrounding inflammatory changes. Moderate stool burden throughout  the remaining colon. Normal appendix. The stomach and small bowel are unremarkable. Vascular/Lymphatic: No significant vascular findings are present. No enlarged abdominal or pelvic lymph nodes. Reproductive: Uterus and bilateral adnexa are unremarkable for the patient's age. Other: No free fluid or pneumoperitoneum. Musculoskeletal: No acute or significant osseous findings. Degenerative changes of the thoracolumbar spine. IMPRESSION: 1. Large stool ball within the rectum and distal sigmoid colon with mild surrounding inflammatory changes, consistent with stercolitis. Moderate stool burden throughout the remaining colon. 2. Moderate distention of the bladder. 3. Small right and trace left pleural effusions with mild interstitial edema at the lung bases. 4. 5 mm nodule in the right middle lobe. No follow-up needed if patient is low-risk. Non-contrast chest CT can be considered in 12 months if patient is high-risk. This recommendation follows the consensus statement: Guidelines for Management of Incidental Pulmonary Nodules Detected on CT Images: From the Fleischner Society 2017; Radiology 2017; 284:228-243. Electronically Signed   By: Titus Dubin M.D.   On: 06/18/2017 12:18     CBC Recent Labs  Lab 06/27/17 1200  WBC 8.7  HGB 12.4  HCT 36.3  PLT 276  MCV 86.1  MCH 29.4  MCHC 34.2  RDW 18.0*    Chemistries  Recent Labs  Lab 06/27/17 1200  NA 136  K 5.2*  CL 108  CO2 22  GLUCOSE 148*  BUN 19  CREATININE 0.78  CALCIUM 9.3  MG 2.2   ------------------------------------------------------------------------------------------------------------------ estimated creatinine clearance is 54.6 mL/min (by C-G formula based on SCr of 0.78 mg/dL). ------------------------------------------------------------------------------------------------------------------ No results for input(s): HGBA1C in the last 72  hours. ------------------------------------------------------------------------------------------------------------------ No results for input(s): CHOL, HDL, LDLCALC, TRIG, CHOLHDL, LDLDIRECT in the last 72 hours. ------------------------------------------------------------------------------------------------------------------ No results for input(s): TSH, T4TOTAL, T3FREE, THYROIDAB in the last 72 hours.  Invalid input(s): FREET3 ------------------------------------------------------------------------------------------------------------------ No results for input(s): VITAMINB12, FOLATE, FERRITIN, TIBC, IRON, RETICCTPCT in the last 72 hours.  Coagulation profile Recent Labs  Lab 06/27/17 1201 06/28/17 0631  INR >10.00* 6.88*    No results for input(s): DDIMER in the last 72 hours.  Cardiac Enzymes Recent Labs  Lab 06/27/17 1827 06/27/17 2328 06/28/17 0631  TROPONINI <0.03 <0.03 <0.03   ------------------------------------------------------------------------------------------------------------------ Invalid input(s): POCBNP    Assessment & Plan  77 year old female patient with history of atrial fibrillation on Coumadin, constipation, hypertension presented to the emergency room for rectal pain and was found to be in atrial fibrillation with rapid rate.  She was admitted to telemetry under hospitalist service  1.  Atrial fibrillation  Status post cardiology evaluation by Dr. Ubaldo Glassing Continue oral Cardizem, metoprolol and flecainide for rate control  2.  Coumadin coagulopathy INR has come down from 10-6.8 Hold Coumadin for now [place] follow-up  3.  Asymptomatic UTI Continue oral Keflex for a duration of 3 days  4.  DVT prophylaxis Has supratherapeutic INR  5.  Recurrent constipation On bowel prep on a daily basis As needed edema      Code Status Orders  (From admission, onward)        Start     Ordered   06/27/17 1936  Full code  Continuous      06/27/17 1935    Code Status History    Date Active Date Inactive Code Status Order ID Comments User Context   06/27/2017 1724 06/27/2017 1935 DNR [id number]  Fritzi Mandes, MD Inpatient   05/21/2017 1636 05/23/2017 1556 Full Code [id number]  Dustin Flock, MD Inpatient   08/26/2016 1411 08/31/2016 1659 Full Code [id number]  Epifanio Lesches, MD ED   08/04/2016 2221 08/14/2016 2040 Full Code [id number]  [place], [place], DO Inpatient   02/29/2016 2053 03/03/2016 1403 Full Code [id number]  Henreitta Leber, MD Inpatient      Time Spent in minutes   35  Greater than 50% of time spent in care coordination and counseling patient regarding the condition and plan of care.   Saundra Shelling M.D on 06/28/2017 at 11:07 AM  Between 7am to 6pm - Pager - [phone]  After 6pm go to [email or web address] - Proofreader  [employer]   Office  [phone]

## 2017-06-29 LAB — CBC
HCT: 35.7 % (ref 35.0–47.0)
Hemoglobin: 11.5 g/dL — ABNORMAL LOW (ref 12.0–16.0)
MCH: 28.4 pg (ref 26.0–34.0)
MCHC: 32.1 g/dL (ref 32.0–36.0)
MCV: 88.4 fL (ref 80.0–100.0)
PLATELETS: 235 10*3/uL (ref 150–440)
RBC: 4.04 MIL/uL (ref 3.80–5.20)
RDW: 17.7 % — AB (ref 11.5–14.5)
WBC: 8.2 10*3/uL (ref 3.6–11.0)

## 2017-06-29 LAB — PROTIME-INR
INR: 5.06 — AB
PROTHROMBIN TIME: 46.5 s — AB (ref 11.4–15.2)

## 2017-06-29 MED ORDER — CEPHALEXIN 500 MG PO CAPS: 500.0000 mg | ORAL_CAPSULE | Freq: Two times a day (BID) | ORAL | 0 refills | Status: AC

## 2017-06-29 NOTE — Progress Notes (Signed)
Pt to be discharged this evening. Iv's and tele removed disch instructions and prescrip given to pt's son. disch via w.c. Accompanied by family

## 2017-06-29 NOTE — Plan of Care (Signed)
No complaints of pain this shift, up to [facility] with one assist, tolerating well. 2L O2 which is chronic.

## 2017-06-29 NOTE — Progress Notes (Signed)
Ambulated loop around nurses' station on 2l 02 and with walker. tol well, gait is steady.

## 2017-06-29 NOTE — Progress Notes (Signed)
[place] for Warfarin Indication: atrial fibrillation  Allergies  Allergen Reactions  . Ampicillin   . Prinzide  [Lisinopril-Hydrochlorothiazide]   . Penicillins Rash    .Has patient had a PCN reaction causing immediate rash, facial/tongue/throat swelling, SOB or lightheadedness with hypotension: Unknown Has patient had a PCN reaction causing severe rash involving mucus membranes or skin necrosis: No Has patient had a PCN reaction that required hospitalization: No Has patient had a PCN reaction occurring within the last 10 years: No If all of the above answers are "NO", then may proceed with Cephalosporin use.     Patient Measurements: Height: 5\' 1"  (154.9 cm) Weight: 160 lb 4.8 oz (72.7 kg) IBW/kg (Calculated) : 47.8  Vital Signs: Temp: 97.7 F (36.5 C) (04/08 0352) Temp Source: Oral (04/08 0352) BP: 89/52 (04/08 0352) Pulse Rate: 76 (04/08 0352)  Labs: Recent Labs    06/27/17 1200 06/27/17 1201 06/27/17 1827 06/27/17 2328 06/28/17 0631 06/29/17 0500  HGB 12.4  --   --   --   --   --   HCT 36.3  --   --   --   --   --   PLT 276  --   --   --   --   --   LABPROT  --  81.1*  --   --  59.1* 46.5*  INR  --  >10.00*  --   --  6.88* 5.06*  CREATININE 0.78  --   --   --   --   --   TROPONINI <0.03  --  <0.03 <0.03 <0.03  --     Estimated Creatinine Clearance: 54.6 mL/min (by C-G formula based on SCr of 0.78 mg/dL).   Medical History: Past Medical History:  Diagnosis Date  . A-fib ([place])   . CHF (congestive heart failure) ([place])   . Hypertension   . Sleep apnea    Assessment: 77 y/o F with a h/o atrial fibrillation and congestive heart failure admitted to the ED for atrial fibrillation. Patient on warfarin 2/2.5 mg alternating dosing PTA. Patient admitted with INR > 10 and given vitamin K 5 mg po x 1 4/6. No bleeding reported. Patient received antibiotics  recently.   4/6  INR > 10   Dose HELD 4/7  INR 6.88   Dose HELD  4/8  INR 5.06 Dose HELD     Goal of Therapy:  INR 2-3   Plan:  Will hold warfarin and continue to follow daily INR.  Patient on antibiotic   Donna Christen Seerat Peaden 06/29/2017,7:36 AM

## 2017-06-29 NOTE — Care Management (Signed)
Patient currently receiving home health services with [facility].  Obtained order to resume services

## 2017-06-29 NOTE — Care Management Important Message (Signed)
Important Message  Patient Details  Name: Hannah Faulkner MRN: [id number] Date of Birth: 1941-03-02   Medicare Important Message Given:  N/A - LOS <3 / Initial given by admissions    Katrina Stack, RN 06/29/2017, 1:52 PM

## 2017-06-29 NOTE — Discharge Summary (Addendum)
[employer] [facility] [place] at [facility], 77 y.o., DOB 1940-11-07, MRN [id number]. Admission date: 06/27/2017 Discharge Date 06/29/2017 Primary MD Jerrol Banana., MD Admitting Physician Fritzi Mandes, MD  Admission Diagnosis   1.  Atrial fibrillation with rapid rate 2 supratherapeutic INR. 3.  Asymptomatic urinary tract infection 4 recurrent constipation.  Discharge Diagnosis        1.  Atrial fibrillation with rate under control 2.  Supratherapeutic INR improved 3.  Asymptomatic UTI 4.  Constipation resolved  Hospital Course  77 year old elderly female patient with history of atrial fibrillation on Coumadin, hypertension, constipation, congestive heart failure, sleep apnea who was admitted under hospitalist service for atrial fibrillation with rapid rate.  At the time of admission her INR was supratherapeutic greater than 10.  No evidence of any hematemesis, hemoptysis and rectal bleed.  Patient received vitamin K orally.  Her INR was closely followed.  Her INR decreased to 5 at the time of discharge.  Her atrial fibrillation was controlled initially with IV Cardizem drip.  Later on she was started on oral Cardizem and she continued her oral metoprolol and flecainide.  Cardiology consultation was done with [facility] clinic cardiology who recommended to continue the oral medications.  Patient received an enema and bowel preps for recurrent constipation which improved.  She was worked up with echocardiogram which showed EF of 60-65%.  Patient asked to skip the dose of warfarin tonight and continue with the home dose of warfarin from tomorrow. patient hemodynamically stable will be discharged home.  Consults  [facility] cardiology consult  Significant Tests:  See full reports for all details    Dg Chest 2 View  Result Date: 06/15/2017 CLINICAL DATA:  History of recent pneumonia, follow-up, persistent cough EXAM: CHEST - 2 VIEW COMPARISON:  Portable chest  x-ray of 05/23/2017 FINDINGS: No active infiltrate or effusion is seen. The lungs remain slightly hyperaerated. Mild cardiomegaly is stable. No acute bony abnormality is noted. IMPRESSION: No pneumonia or effusion.  Stable mild cardiomegaly. Electronically Signed   By: Ivar Drape M.D.   On: 06/15/2017 17:16   Dg Chest Port 1 View  Result Date: 06/27/2017 CLINICAL DATA:  77 year old female with rectal pain, recent fecal disimpaction procedure EXAM: PORTABLE CHEST 1 VIEW COMPARISON:  Prior chest x-ray 06/15/2017 FINDINGS: Interval development of patchy airspace opacity obscuring the right cardiac margin and the right diaphragm. Suspect a layering pleural effusion is well. Linear opacities in the left lateral lung base likely reflect atelectasis. Stable cardiomegaly. Atherosclerotic calcifications are present in the transverse aorta. The upper lungs are well aerated and clear. No pneumothorax. No acute osseous abnormality. IMPRESSION: 1. New right basilar airspace opacity concerning for a combination of pleural effusion with atelectasis and/or infiltrate. 2. Mildly increased left basilar atelectasis. 3. Stable cardiomegaly. Electronically Signed   By: Jacqulynn Cadet M.D.   On: 06/27/2017 13:03   Dg Abd 2 Views  Result Date: 06/27/2017 CLINICAL DATA:  Rectal pain today. EXAM: ABDOMEN - 2 VIEW COMPARISON:  None. FINDINGS: There is a small effusion and underlying atelectasis in the right base. No free air, portal venous gas, or pneumatosis. The bowel gas pattern is nonobstructive. Calcifications in the pelvis are likely phleboliths. No other acute abnormalities. IMPRESSION: 1. Small effusion and underlying opacity in the right lung base. 2. No other acute abnormalities. Electronically Signed   By: Dorise Bullion III M.D   On: 06/27/2017 13:03   Ct Renal Stone Study  Result Date:  06/18/2017 CLINICAL DATA:  Lower abdominal pain and constipation. EXAM: CT ABDOMEN AND PELVIS WITHOUT CONTRAST TECHNIQUE:  Multidetector CT imaging of the abdomen and pelvis was performed following the standard protocol without IV contrast. COMPARISON:  None. FINDINGS: Lower chest: 5 mm aggregate nodule in the right middle lobe (series 5, image 6). Small right and trace left pleural effusions with mild interlobular septal thickening at the lung bases. Scarring/atelectasis in the lingula. Cardiomegaly. Hepatobiliary: No focal liver abnormality. Mild gallbladder distention without wall thickening or biliary dilatation. Pancreas: Unremarkable. No pancreatic ductal dilatation or surrounding inflammatory changes. Spleen: Normal in size without focal abnormality. Adrenals/Urinary Tract: Mild thickening of the left adrenal gland without discrete nodule. The right adrenal gland is unremarkable. No renal or ureteral calculi. No hydronephrosis. Moderate distention of the bladder. Stomach/Bowel: Large stool ball within the rectum and distal sigmoid colon with mild surrounding inflammatory changes. Moderate stool burden throughout the remaining colon. Normal appendix. The stomach and small bowel are unremarkable. Vascular/Lymphatic: No significant vascular findings are present. No enlarged abdominal or pelvic lymph nodes. Reproductive: Uterus and bilateral adnexa are unremarkable for the patient's age. Other: No free fluid or pneumoperitoneum. Musculoskeletal: No acute or significant osseous findings. Degenerative changes of the thoracolumbar spine. IMPRESSION: 1. Large stool ball within the rectum and distal sigmoid colon with mild surrounding inflammatory changes, consistent with stercolitis. Moderate stool burden throughout the remaining colon. 2. Moderate distention of the bladder. 3. Small right and trace left pleural effusions with mild interstitial edema at the lung bases. 4. 5 mm nodule in the right middle lobe. No follow-up needed if patient is low-risk. Non-contrast chest CT can be considered in 12 months if patient is high-risk. This  recommendation follows the consensus statement: Guidelines for Management of Incidental Pulmonary Nodules Detected on CT Images: From the Fleischner Society 2017; Radiology 2017; 284:228-243. Electronically Signed   By: Titus Dubin M.D.   On: 06/18/2017 12:18       Today   Subjective:   Hannah Faulkner was seen and evaluated by me on the day of discharge No complaints of chest pain No palpitations No fever and chills  Objective:   Blood pressure 101/60, pulse 92, temperature 97.7 F (36.5 C), temperature source Oral, resp. rate 18, height 5\' 1"  (1.549 m), weight 72.7 kg (160 lb 4.8 oz), SpO2 99 %.  .  Intake/Output Summary (Last 24 hours) at 06/29/2017 1544 Last data filed at 06/29/2017 1353 Gross per 24 hour  Intake 1200 ml  Output 3300 ml  Net -2100 ml    Exam VITAL SIGNS: Blood pressure 101/60, pulse 92, temperature 97.7 F (36.5 C), temperature source Oral, resp. rate 18, height 5\' 1"  (1.549 m), weight 72.7 kg (160 lb 4.8 oz), SpO2 99 %.  GENERAL:  77 y.o.-year-old patient lying in the bed with no acute distress.  EYES: Pupils equal, round, reactive to light and accommodation. No scleral icterus. Extraocular muscles intact.  HEENT: Head atraumatic, normocephalic. Oropharynx and nasopharynx clear.  NECK:  Supple, no jugular venous distention. No thyroid enlargement, no tenderness.  LUNGS: Normal breath sounds bilaterally, no wheezing, rales,rhonchi or crepitation. No use of accessory muscles of respiration.  CARDIOVASCULAR: S1, S2 irregular. No murmurs, rubs, or gallops.  ABDOMEN: Soft, nontender, nondistended. Bowel sounds present. No organomegaly or mass.  EXTREMITIES: No pedal edema, cyanosis, or clubbing.  NEUROLOGIC: Cranial nerves II through XII are intact. Muscle strength 5/5 in all extremities. Sensation intact. Gait not checked.  PSYCHIATRIC: The patient is alert and oriented x  3.  SKIN: No obvious rash, lesion, or ulcer.   Data Review     CBC w Diff:  Lab  Results  Component Value Date   WBC 8.2 06/29/2017   HGB 11.5 (L) 06/29/2017   HGB 12.2 11/27/2015   HCT 35.7 06/29/2017   HCT 36.2 11/27/2015   PLT 235 06/29/2017   PLT 191 11/27/2015   LYMPHOPCT 15 08/06/2016   MONOPCT 9 08/06/2016   EOSPCT 1 08/06/2016   BASOPCT 1 08/06/2016   CMP:  Lab Results  Component Value Date   NA 136 06/27/2017   NA 144 09/02/2016   K 5.2 (H) 06/27/2017   CL 108 06/27/2017   CO2 22 06/27/2017   BUN 19 06/27/2017   BUN 22 09/02/2016   CREATININE 0.78 06/27/2017   GLU 115 09/13/2013   PROT 8.0 06/18/2017   PROT 6.8 11/27/2015   ALBUMIN 3.9 06/18/2017   ALBUMIN 4.3 09/02/2016   BILITOT 1.8 (H) 06/18/2017   BILITOT 0.8 11/27/2015   ALKPHOS 79 06/18/2017   AST 34 06/18/2017   ALT 13 (L) 06/18/2017  .  Micro Results Recent Results (from the past 240 hour(s))  Urine Culture     Status: None   Collection Time: 06/23/17  8:30 AM  Result Value Ref Range Status   Urine Culture, Routine Final report  Final   Organism ID, Bacteria Comment  Final    Comment: Mixed urogenital flora Greater than 100,000 colony forming units per mL   Urine culture     Status: Abnormal   Collection Time: 06/27/17 12:01 PM  Result Value Ref Range Status   Specimen Description   Final    URINE, CLEAN CATCH Performed at [facility], [address], [place], [place] [address]    Special Requests   Final    Normal Performed at [facility], [address], [place], [place] [address]    Culture (A)  Final    <10,000 COLONIES/mL INSIGNIFICANT GROWTH Performed at [facility], [place] [address] [place], [place] [address]    Report Status 06/28/2017 FINAL  Final        Code Status Orders  (From admission, onward)        Start     Ordered   06/27/17 1936  Full code  Continuous     06/27/17 1935    Code Status History    Date Active Date Inactive Code Status Order ID Comments User Context   06/27/2017 1724 06/27/2017 1935 DNR [id number]   Fritzi Mandes, MD Inpatient   05/21/2017 1636 05/23/2017 1556 Full Code [id number]  Dustin Flock, MD Inpatient   08/26/2016 1411 08/31/2016 1659 Full Code [id number]  Epifanio Lesches, MD ED   08/04/2016 2221 08/14/2016 2040 Full Code [id number]  [place]yer, [place], DO Inpatient   02/29/2016 2053 03/03/2016 1403 Full Code [id number]  Henreitta Leber, MD Inpatient          Follow-up Information    Jerrol Banana., MD. Go on 07/02/2017.   Specialty:  Family Medicine Why:  PLEASE KEEP AND GO TO [place] FOR July 02, 2017 at 2:00pm. Thanks! Contact information: [address] [place] [address] [phone]        Isaias Cowman, MD. Go on 07/20/2017.   Specialty:  Cardiology Why:  Appointment Time: 11:30am Contact information: [facility] [place] [address] [phone]           Discharge Medications  Allergies as of 06/29/2017      Reactions   Ampicillin    Prinzide  [lisinopril-hydrochlorothiazide]    Penicillins Rash   .Has patient had a PCN reaction causing immediate rash, facial/tongue/throat swelling, SOB or lightheadedness with hypotension: Unknown Has patient had a PCN reaction causing severe rash involving mucus membranes or skin necrosis: No Has patient had a PCN reaction that required hospitalization: No Has patient had a PCN reaction occurring within the last 10 years: No If all of the above answers are "NO", then may proceed with Cephalosporin use.      Medication List    TAKE these medications   albuterol 108 (90 Base) MCG/ACT inhaler Commonly known as:  PROVENTIL HFA;VENTOLIN HFA Inhale 2 puffs into the lungs every 6 (six) hours as needed for wheezing or shortness of breath.   ALPRAZolam 0.5 MG tablet Commonly known as:  XANAX TAKE 1 TABLET BY MOUTH AT BEDTIME AS NEEDED   cephALEXin 500 MG capsule Commonly known as:  KEFLEX Take 1 capsule (500 mg total) by  mouth every 12 (twelve) hours for 1 day.   cyanocobalamin 1000 MCG tablet Take 1,000 mcg by mouth daily.   diltiazem 120 MG tablet Commonly known as:  CARDIZEM Take 120 mg by mouth 2 times daily at 12 noon and 4 pm.   flecainide 50 MG tablet Commonly known as:  TAMBOCOR Take 50 mg by mouth 2 (two) times daily.   gabapentin 100 MG capsule Commonly known as:  NEURONTIN TAKE 1 CAPSULE (100 MG TOTAL) BY MOUTH 3 (THREE) TIMES DAILY.   glycerin adult 2 g suppository Place 1 suppository rectally as needed for constipation.   magnesium oxide 400 MG tablet Commonly known as:  MAG-OX Take 400 mg by mouth daily.   metolazone 2.5 MG tablet Commonly known as:  ZAROXOLYN Take 1 tablet (2.5 mg total) by mouth once a week.   metoprolol tartrate 100 MG tablet Commonly known as:  LOPRESSOR Take 1 tablet (100 mg total) by mouth 2 (two) times daily.   omeprazole 20 MG capsule Commonly known as:  PRILOSEC Take 1 capsule (20 mg total) by mouth at bedtime.   polyethylene glycol powder powder Commonly known as:  GLYCOLAX/MIRALAX Take 17 g by mouth 2 (two) times daily as needed.   potassium chloride SA 20 MEQ tablet Commonly known as:  K-DUR,KLOR-CON Take 2 tablets (40 mEq total) by mouth 2 (two) times daily. And an additional 48meq ever Wednesday with metolazone   prochlorperazine 10 MG tablet Commonly known as:  COMPAZINE Take 1 tablet (10 mg total) by mouth every 8 (eight) hours as needed for nausea or vomiting.   senna-docusate 8.6-50 MG tablet Commonly known as:  Senokot-S Take 1 tablet by mouth daily.   torsemide 20 MG tablet Commonly known as:  DEMADEX Take 2 tablets (40 mg total) by mouth 2 (two) times daily.   Vitamin B6 200 MG Tabs Take 200 tablets by mouth 2 (two) times daily.   warfarin 2 MG tablet Commonly known as:  COUMADIN Take 2 mg by mouth every other day. Take 2mg  one day and take 2.5mg  next day, alternating. What changed:  Another medication with the same name  was removed. Continue taking this medication, and follow the directions you see here.          Total Time in preparing paper work, data evaluation and todays exam - 35 minutes  Saundra Shelling M.D on 06/29/2017 at 3:44 PM [place]  [phone]

## 2017-06-29 NOTE — Progress Notes (Signed)
PT Cancellation Note  Patient Details Name: Hannah Faulkner MRN: [id number] DOB: 1940-07-19   Cancelled Treatment:    Reason Eval/Treat Not Completed: PT screened, no needs identified, will sign off(Consult received and chart reviwed.  Upon attempt at treatment session, patient ambulating in hallway with nursing staff, sup/mod indep with RW.  Uses RW, O2 at baseline.  Appears steady without mobility concern; patient feels at baseline and denies need for PT evaluation-"other people need you more than I do".  RN in agreement.  Will complete initial order at this time; please re-consult should needs change. )   Anisa Leanos H. Owens Shark, PT, DPT, NCS 06/29/17, 11:13 AM [phone]

## 2017-06-30 ENCOUNTER — Telehealth: Payer: Self-pay

## 2017-06-30 NOTE — Telephone Encounter (Signed)
Transition Care Management Follow-Up Telephone Call   Date discharged and where: [facility] on 06/29/17  How have you been since you were released from the hospital? Very tired, heart rate has decreased. Declines SOB, chest pain, n/v/d.   Any patient concerns? None  Items Reviewed:   Meds: Declined verifying at this time. Pt states she will go over this at apt on 07/02/17.  Allergies: verifierd  Dietary Changes Reviewed: heart healthy  Functional Questionnaire:  Independent-I Dependent-D  ADLs:   Dressing- I    Eating- I   Maintaining continence- I   Transferring- I   Transportation- I   Meal Prep- I   Managing Meds- I   Confirmed importance and Date/Time of follow-up visits scheduled: 07/02/17 @ 1:20 PM for a wellness and 2:00 for a CPE.   Confirmed with patient if condition worsens to call PCP or go to the Emergency Dept. Patient was given office number and encouraged to call back with questions or concerns: YES

## 2017-07-01 ENCOUNTER — Telehealth: Payer: Self-pay | Admitting: Family Medicine

## 2017-07-01 DIAGNOSIS — G4733 Obstructive sleep apnea (adult) (pediatric): Secondary | ICD-10-CM | POA: Diagnosis not present

## 2017-07-01 DIAGNOSIS — Z9981 Dependence on supplemental oxygen: Secondary | ICD-10-CM | POA: Diagnosis not present

## 2017-07-01 DIAGNOSIS — K5641 Fecal impaction: Secondary | ICD-10-CM | POA: Diagnosis not present

## 2017-07-01 DIAGNOSIS — I5032 Chronic diastolic (congestive) heart failure: Secondary | ICD-10-CM | POA: Diagnosis not present

## 2017-07-01 DIAGNOSIS — I110 Hypertensive heart disease with heart failure: Secondary | ICD-10-CM | POA: Diagnosis not present

## 2017-07-01 DIAGNOSIS — I480 Paroxysmal atrial fibrillation: Secondary | ICD-10-CM | POA: Diagnosis not present

## 2017-07-01 DIAGNOSIS — J9611 Chronic respiratory failure with hypoxia: Secondary | ICD-10-CM | POA: Diagnosis not present

## 2017-07-01 DIAGNOSIS — Z7901 Long term (current) use of anticoagulants: Secondary | ICD-10-CM | POA: Diagnosis not present

## 2017-07-01 DIAGNOSIS — N390 Urinary tract infection, site not specified: Secondary | ICD-10-CM | POA: Diagnosis not present

## 2017-07-01 NOTE — Telephone Encounter (Signed)
Hannah Faulkner with [facility] needs verbal ok for nursing visits for 2 week one, 1 week 2 and 1 time every other week for 5 weeks.

## 2017-07-01 NOTE — Telephone Encounter (Signed)
Please advise. Thanks.  

## 2017-07-02 ENCOUNTER — Encounter: Payer: Self-pay | Admitting: Family Medicine

## 2017-07-02 NOTE — Telephone Encounter (Signed)
Advised  ED 

## 2017-07-02 NOTE — Telephone Encounter (Signed)
ok 

## 2017-07-03 DIAGNOSIS — J9611 Chronic respiratory failure with hypoxia: Secondary | ICD-10-CM | POA: Diagnosis not present

## 2017-07-03 DIAGNOSIS — G4733 Obstructive sleep apnea (adult) (pediatric): Secondary | ICD-10-CM | POA: Diagnosis not present

## 2017-07-03 DIAGNOSIS — I110 Hypertensive heart disease with heart failure: Secondary | ICD-10-CM | POA: Diagnosis not present

## 2017-07-03 DIAGNOSIS — Z7901 Long term (current) use of anticoagulants: Secondary | ICD-10-CM | POA: Diagnosis not present

## 2017-07-03 DIAGNOSIS — Z9981 Dependence on supplemental oxygen: Secondary | ICD-10-CM | POA: Diagnosis not present

## 2017-07-03 DIAGNOSIS — N390 Urinary tract infection, site not specified: Secondary | ICD-10-CM | POA: Diagnosis not present

## 2017-07-03 DIAGNOSIS — I5032 Chronic diastolic (congestive) heart failure: Secondary | ICD-10-CM | POA: Diagnosis not present

## 2017-07-03 DIAGNOSIS — K5641 Fecal impaction: Secondary | ICD-10-CM | POA: Diagnosis not present

## 2017-07-03 DIAGNOSIS — I480 Paroxysmal atrial fibrillation: Secondary | ICD-10-CM | POA: Diagnosis not present

## 2017-07-07 ENCOUNTER — Telehealth: Payer: Self-pay | Admitting: Family Medicine

## 2017-07-07 DIAGNOSIS — Z7901 Long term (current) use of anticoagulants: Secondary | ICD-10-CM | POA: Diagnosis not present

## 2017-07-07 DIAGNOSIS — Z9981 Dependence on supplemental oxygen: Secondary | ICD-10-CM | POA: Diagnosis not present

## 2017-07-07 DIAGNOSIS — I110 Hypertensive heart disease with heart failure: Secondary | ICD-10-CM | POA: Diagnosis not present

## 2017-07-07 DIAGNOSIS — G4733 Obstructive sleep apnea (adult) (pediatric): Secondary | ICD-10-CM | POA: Diagnosis not present

## 2017-07-07 DIAGNOSIS — I480 Paroxysmal atrial fibrillation: Secondary | ICD-10-CM | POA: Diagnosis not present

## 2017-07-07 DIAGNOSIS — N390 Urinary tract infection, site not specified: Secondary | ICD-10-CM | POA: Diagnosis not present

## 2017-07-07 DIAGNOSIS — I5032 Chronic diastolic (congestive) heart failure: Secondary | ICD-10-CM | POA: Diagnosis not present

## 2017-07-07 DIAGNOSIS — K5641 Fecal impaction: Secondary | ICD-10-CM | POA: Diagnosis not present

## 2017-07-07 DIAGNOSIS — J9611 Chronic respiratory failure with hypoxia: Secondary | ICD-10-CM | POA: Diagnosis not present

## 2017-07-07 NOTE — Telephone Encounter (Signed)
Please review. Thanks!  

## 2017-07-07 NOTE — Telephone Encounter (Signed)
Derwood Kaplan with [facility] is requesting verbal orders for in home physical therapy as follows: Once a week for 4 weeks.  Please advise. Thanks TNP

## 2017-07-08 DIAGNOSIS — I890 Lymphedema, not elsewhere classified: Secondary | ICD-10-CM | POA: Diagnosis not present

## 2017-07-08 NOTE — Telephone Encounter (Signed)
ok 

## 2017-07-08 NOTE — Telephone Encounter (Signed)
Advised 

## 2017-07-09 DIAGNOSIS — K5641 Fecal impaction: Secondary | ICD-10-CM | POA: Diagnosis not present

## 2017-07-09 DIAGNOSIS — G4733 Obstructive sleep apnea (adult) (pediatric): Secondary | ICD-10-CM | POA: Diagnosis not present

## 2017-07-09 DIAGNOSIS — I5032 Chronic diastolic (congestive) heart failure: Secondary | ICD-10-CM | POA: Diagnosis not present

## 2017-07-09 DIAGNOSIS — J9611 Chronic respiratory failure with hypoxia: Secondary | ICD-10-CM | POA: Diagnosis not present

## 2017-07-09 DIAGNOSIS — I110 Hypertensive heart disease with heart failure: Secondary | ICD-10-CM | POA: Diagnosis not present

## 2017-07-09 DIAGNOSIS — Z9981 Dependence on supplemental oxygen: Secondary | ICD-10-CM | POA: Diagnosis not present

## 2017-07-09 DIAGNOSIS — Z7901 Long term (current) use of anticoagulants: Secondary | ICD-10-CM | POA: Diagnosis not present

## 2017-07-09 DIAGNOSIS — N390 Urinary tract infection, site not specified: Secondary | ICD-10-CM | POA: Diagnosis not present

## 2017-07-09 DIAGNOSIS — I480 Paroxysmal atrial fibrillation: Secondary | ICD-10-CM | POA: Diagnosis not present

## 2017-07-10 NOTE — Progress Notes (Signed)
Patient ID: Jarold Motto Crite, female    DOB: 11-20-1940, 77 y.o.   MRN: [id number]  HPI  Ms. Maxon is a 77 yo female with a PMH of atrial fibrillation, HTN, restless leg syndrome, and CHF.   Last echo on 08/07/16 which showed an EF of 60-65%.  Admitted 06/27/17 due to atrial fibrillation with RVR along with supratherapeutic INR level. Was given vitamin K. Initially needed cardizem drip and then transitioned to oral medications. Cardiology consult obtained and she was discharged after 2 days. Was in the ED 06/18/17 due to fecal impaction where she was treated and released. Admitted 05/21/17 due to atrial fibrillation with RVR along with pneumonia. Was given antibiotics. Cardiology consult obtained. Discharged after 2 days.      She presents for a follow-up visit with a chief complaint of moderate fatigue upon minimal exertion. She describes this as chronic in nature having been present for several years. She has associated shortness of breath, edema, abdominal pain, constipation and nausea along with this. She denies any difficulty sleeping, palpitations, chest pain, cough, dizziness or weight gain.   Past Medical History:  Diagnosis Date  . A-fib ([place])   . CHF (congestive heart failure) ([place])   . Hypertension   . Sleep apnea    Past Surgical History:  Procedure Laterality Date  . EYE SURGERY    . POLYPECTOMY     colon poly premoved   Family History  Problem Relation Age of Onset  . Hypertension Mother   . Heart disease Mother   . CVA Mother   . Heart attack Mother   . Lung cancer Father   . Heart attack Sister   . CVA Sister   . Thyroid disease Sister   . Liver cancer Brother   . Thyroid disease Sister   . Thyroid disease Sister   . Diabetes Sister   . Dementia Sister   . Atrial fibrillation Sister    Social History   Tobacco Use  . Smoking status: Never Smoker  . Smokeless tobacco: Never Used  Substance Use Topics  . Alcohol use: No   Allergies  Allergen Reactions  .  Ampicillin   . Prinzide  [Lisinopril-Hydrochlorothiazide]   . Penicillins Rash    .Has patient had a PCN reaction causing immediate rash, facial/tongue/throat swelling, SOB or lightheadedness with hypotension: Unknown Has patient had a PCN reaction causing severe rash involving mucus membranes or skin necrosis: No Has patient had a PCN reaction that required hospitalization: No Has patient had a PCN reaction occurring within the last 10 years: No If all of the above answers are "NO", then may proceed with Cephalosporin use.    Prior to Admission medications   Medication Sig Start Date End Date Taking? Authorizing Provider  albuterol (PROVENTIL HFA;VENTOLIN HFA) 108 (90 Base) MCG/ACT inhaler Inhale 2 puffs into the lungs every 6 (six) hours as needed for wheezing or shortness of breath.   Yes [provider]  ALPRAZolam Duanne Moron) 0.5 MG tablet TAKE 1 TABLET BY MOUTH AT BEDTIME AS NEEDED 05/11/17  Yes Jerrol Banana., MD  cyanocobalamin 1000 MCG tablet Take 1,000 mcg by mouth daily.   Yes [provider]  diltiazem (CARDIZEM) 120 MG tablet Take 120 mg by mouth 2 times daily at 12 noon and 4 pm.    Yes [provider]  flecainide (TAMBOCOR) 50 MG tablet Take 50 mg by mouth 2 (two) times daily.  11/16/13  Yes [provider]  gabapentin (NEURONTIN) 100  MG capsule TAKE 1 CAPSULE (100 MG TOTAL) BY MOUTH 3 (THREE) TIMES DAILY. 12/21/16  Yes Jerrol Banana., MD  glycerin adult 2 g suppository Place 1 suppository rectally as needed for constipation. 06/18/17  Yes Merlyn Lot, MD  magnesium oxide (MAG-OX) 400 MG tablet Take 400 mg by mouth daily.   Yes [provider]  metoprolol tartrate (LOPRESSOR) 100 MG tablet Take 1 tablet (100 mg total) by mouth 2 (two) times daily. 08/31/16  Yes Theodoro Grist, MD  omeprazole (PRILOSEC) 20 MG capsule Take 1 capsule (20 mg total) by mouth at bedtime. 04/15/17  Yes Jerrol Banana., MD  polyethylene  glycol powder [facility]) powder Take 17 g by mouth 2 (two) times daily as needed. 06/16/17  Yes Jerrol Banana., MD  potassium chloride SA (K-DUR,KLOR-CON) 20 MEQ tablet Take 2 tablets (40 mEq total) by mouth 2 (two) times daily. And an additional 76meq ever Wednesday with metolazone 03/09/17  Yes Darylene Price A, FNP  prochlorperazine (COMPAZINE) 10 MG tablet Take 1 tablet (10 mg total) by mouth every 8 (eight) hours as needed for nausea or vomiting. 09/05/16  Yes Birdie Sons, MD  Pyridoxine HCl (VITAMIN B6) 200 MG TABS Take 200 tablets by mouth 2 (two) times daily.  11/16/13  Yes [provider]  senna-docusate (SENOKOT-S) 8.6-50 MG tablet Take 1 tablet by mouth daily. 06/16/17  Yes Jerrol Banana., MD  torsemide (DEMADEX) 20 MG tablet Take 2 tablets (40 mg total) by mouth 2 (two) times daily. 05/15/17  Yes Darylene Price A, FNP  warfarin (COUMADIN) 2 MG tablet Take 2 mg by mouth every other day. Take 2mg  one day and take 2.5mg  next day, alternating.   Yes [provider]  metolazone (ZAROXOLYN) 2.5 MG tablet Take 1 tablet (2.5 mg total) by mouth once a week. 05/15/17 06/27/17  Alisa Graff, FNP    Review of Systems  Constitutional: Positive for fatigue. Negative for appetite change.  HENT: Negative for congestion, postnasal drip and sore throat.   Eyes: Negative.   Respiratory: Positive for shortness of breath. Negative for cough and chest tightness.   Cardiovascular: Positive for leg swelling (right > left). Negative for chest pain and palpitations.  Gastrointestinal: Positive for abdominal pain (at times), constipation and nausea (at times). Negative for abdominal distention and diarrhea.  Endocrine: Negative.   Genitourinary: Negative.   Musculoskeletal: Negative for arthralgias, back pain and neck pain.  Skin: Negative.   Allergic/Immunologic: Negative.   Neurological: Negative for dizziness and light-headedness.  Hematological: Negative for  adenopathy. Does not bruise/bleed easily.  Psychiatric/Behavioral: Negative for dysphoric mood and sleep disturbance (wearing oxygen on 2L). The patient is not nervous/anxious.    Vitals:   07/13/17 1551  BP: (!) 116/92  Pulse: 98  Resp: 18  SpO2: 96%  Weight: 159 lb 4 oz (72.2 kg)  Height: 5\' 1"  (1.549 m)   Wt Readings from Last 3 Encounters:  07/13/17 159 lb 4 oz (72.2 kg)  06/28/17 160 lb 4.8 oz (72.7 kg)  06/18/17 151 lb (68.5 kg)   Lab Results  Component Value Date   CREATININE 0.78 06/27/2017   CREATININE 0.84 06/18/2017   CREATININE 1.20 (H) 05/22/2017   Physical Exam  Constitutional: She is oriented to person, place, and time. She appears well-developed and well-nourished.  HENT:  Head: Normocephalic and atraumatic.  Neck: Normal range of motion. Neck supple. No JVD present.  Cardiovascular: Normal rate. An irregular rhythm present.  Pulmonary/Chest:  Effort normal. She has no wheezes. She has no rales.  Abdominal: Soft. She exhibits no distension. There is no tenderness.  Musculoskeletal: She exhibits edema (1+ pitting edema in right lower leg). She exhibits no tenderness.  Neurological: She is alert and oriented to person, place, and time.  Skin: Skin is warm and dry.  Multiple spider veins noted in bilateral lower legs/feet with more on the right  Psychiatric: She has a normal mood and affect. Her behavior is normal. Thought content normal.  Nursing note and vitals reviewed.  Assessment & Plan 1: Chronic heart failure with preserved ejection fraction- - NYHA class III - euvolemic today - weighing daily and writing weight down. Continue to weigh daily and to call for an overnight weight gain > 2 pounds or a weekly weight gain > 5 pounds;  - weight down 6 pounds from the last time she was here  - has not taken diuretic yet today but will do so once she returns home - using salt when cooking foods. Advised to follow a 2000 mg sodium diet.  - saw cardiology  ([place]) 06/01/17 - had sleep study on 01/16/17 but says they didn't get good results and had the home sleep study done; she has to follow up with pulmonologist who ordered the test - wearing oxygen at 2L around the clock  2: HTN- - BP looks good today - saw PCP Rosanna Randy) 06/27/17 & returns 07/15/17 - BMP from 06/27/17 reviewed an shows sodium 136, potassium 5.2 and GFR >60   3: Atrial fibrillation- - continues on metoprolol tartrate, diltiazem, and flecainide - now taking warfarin 2mg  QOD - INR 5.06 on 06/29/17 (was >10.00 on 06/27/17)   4: Lymphedema- - stage 2 lymphedema - she admits that she's not elevating her legs like she should and she was encouraged to elevate them when sitting for long periods of time - has worn compression socks in the past but inconsistently because she says that she has difficulty in putting them on - mom lost her leg due to poor circulation - patient says that she just received her compression boots 07/11/17 and someone from the company is supposed to come out today to review how to wear them  5: Constipation- - has been in the ED for fecal impaction - taking miralax daily; has stool softener and glycerin suppository to use PRN. Advised her to take the stool softener every day - may need GI referral if constipation continues  Patient did not bring her medications nor a list. Each medication was verbally reviewed with the patient and she was encouraged to bring the bottles to every visit to confirm accuracy of list.  Return in 1 month or sooner for any questions/problems before then.

## 2017-07-13 ENCOUNTER — Encounter: Payer: Self-pay | Admitting: Family

## 2017-07-13 ENCOUNTER — Ambulatory Visit: Payer: Medicare Other | Attending: Family | Admitting: Family

## 2017-07-13 VITALS — BP 116/92 | HR 98 | Resp 18 | Ht 61.0 in | Wt 159.2 lb

## 2017-07-13 DIAGNOSIS — K5641 Fecal impaction: Secondary | ICD-10-CM | POA: Diagnosis not present

## 2017-07-13 DIAGNOSIS — K5900 Constipation, unspecified: Secondary | ICD-10-CM | POA: Insufficient documentation

## 2017-07-13 DIAGNOSIS — R0602 Shortness of breath: Secondary | ICD-10-CM | POA: Insufficient documentation

## 2017-07-13 DIAGNOSIS — I110 Hypertensive heart disease with heart failure: Secondary | ICD-10-CM | POA: Diagnosis not present

## 2017-07-13 DIAGNOSIS — I890 Lymphedema, not elsewhere classified: Secondary | ICD-10-CM

## 2017-07-13 DIAGNOSIS — I5032 Chronic diastolic (congestive) heart failure: Secondary | ICD-10-CM | POA: Insufficient documentation

## 2017-07-13 DIAGNOSIS — G2581 Restless legs syndrome: Secondary | ICD-10-CM | POA: Insufficient documentation

## 2017-07-13 DIAGNOSIS — Z7901 Long term (current) use of anticoagulants: Secondary | ICD-10-CM | POA: Insufficient documentation

## 2017-07-13 DIAGNOSIS — G4730 Sleep apnea, unspecified: Secondary | ICD-10-CM | POA: Insufficient documentation

## 2017-07-13 DIAGNOSIS — Z79899 Other long term (current) drug therapy: Secondary | ICD-10-CM | POA: Diagnosis not present

## 2017-07-13 DIAGNOSIS — I480 Paroxysmal atrial fibrillation: Secondary | ICD-10-CM

## 2017-07-13 DIAGNOSIS — I10 Essential (primary) hypertension: Secondary | ICD-10-CM

## 2017-07-13 DIAGNOSIS — I4891 Unspecified atrial fibrillation: Secondary | ICD-10-CM | POA: Diagnosis not present

## 2017-07-13 NOTE — Patient Instructions (Signed)
Continue weighing daily and call for an overnight weight gain of > 2 pounds or a weekly weight gain of >5 pounds. 

## 2017-07-14 ENCOUNTER — Telehealth: Payer: Self-pay | Admitting: Family Medicine

## 2017-07-14 DIAGNOSIS — I5032 Chronic diastolic (congestive) heart failure: Secondary | ICD-10-CM | POA: Diagnosis not present

## 2017-07-14 DIAGNOSIS — K5641 Fecal impaction: Secondary | ICD-10-CM | POA: Diagnosis not present

## 2017-07-14 DIAGNOSIS — N390 Urinary tract infection, site not specified: Secondary | ICD-10-CM

## 2017-07-14 DIAGNOSIS — I110 Hypertensive heart disease with heart failure: Secondary | ICD-10-CM | POA: Diagnosis not present

## 2017-07-14 DIAGNOSIS — Z9981 Dependence on supplemental oxygen: Secondary | ICD-10-CM | POA: Diagnosis not present

## 2017-07-14 DIAGNOSIS — Z7901 Long term (current) use of anticoagulants: Secondary | ICD-10-CM | POA: Diagnosis not present

## 2017-07-14 DIAGNOSIS — I480 Paroxysmal atrial fibrillation: Secondary | ICD-10-CM | POA: Diagnosis not present

## 2017-07-14 DIAGNOSIS — J9611 Chronic respiratory failure with hypoxia: Secondary | ICD-10-CM | POA: Diagnosis not present

## 2017-07-14 DIAGNOSIS — G4733 Obstructive sleep apnea (adult) (pediatric): Secondary | ICD-10-CM | POA: Diagnosis not present

## 2017-07-14 NOTE — Telephone Encounter (Signed)
Hannah Faulkner with [name] called back stating that she spoke with the pt, pt advised she was treated for UTI at hospital and she wasn't sure the name of the antibiotic. Pt is scheduled for OV with dr. Rosanna Randy tomorrow 07/15/17. Please advise. Thanks TNP

## 2017-07-14 NOTE — Telephone Encounter (Signed)
Mardene Celeste with [facility] stated that pt is still having burning when urinating and pt finished her antibiotic for UTI yesterday 07/13/17. Mardene Celeste stated that she didn't have pt's info pulled up and wasn't sure the name of the antibiotic and that she thought we were treating pt for UTI. Please advise. Thanks TNP

## 2017-07-15 ENCOUNTER — Other Ambulatory Visit: Payer: Self-pay

## 2017-07-15 ENCOUNTER — Ambulatory Visit (INDEPENDENT_AMBULATORY_CARE_PROVIDER_SITE_OTHER): Payer: Medicare Other

## 2017-07-15 VITALS — BP 104/58 | HR 72 | Temp 99.0°F | Ht 61.0 in | Wt 155.6 lb

## 2017-07-15 DIAGNOSIS — I5032 Chronic diastolic (congestive) heart failure: Secondary | ICD-10-CM | POA: Diagnosis not present

## 2017-07-15 DIAGNOSIS — Z0000 Encounter for general adult medical examination without abnormal findings: Secondary | ICD-10-CM | POA: Diagnosis not present

## 2017-07-15 DIAGNOSIS — N390 Urinary tract infection, site not specified: Secondary | ICD-10-CM

## 2017-07-15 NOTE — Progress Notes (Signed)
Subjective:   Hannah Faulkner is a 77 y.o. female who presents for Medicare Annual (Subsequent) preventive examination.  Review of Systems:  N/A  Cardiac Risk Factors include: advanced age (>84men, >41 women);diabetes mellitus;hypertension     Objective:     Vitals: BP (!) 104/58 (BP Location: Right Arm)   Pulse 72   Temp 99 F (37.2 C) (Oral)   Ht 5\' 1"  (1.549 m)   Wt 155 lb 9.6 oz (70.6 kg)   BMI 29.40 kg/m   Body mass index is 29.4 kg/m.  Advanced Directives 07/15/2017 06/27/2017 06/27/2017 05/21/2017 05/21/2017 01/20/2017 01/05/2017  Does Patient Have a Medical Advance Directive? Yes No No No No No -  Type of Advance Directive Healthcare Power of [place]  Does patient want to make changes to medical advance directive? - No - Patient declined No - Patient declined - - - -  Copy of [place] in Chart? No - copy requested - - - - Yes -  Would patient like information on creating a medical advance directive? - No - Patient declined No - Patient declined Yes (Inpatient - patient requests chaplain consult to create a medical advance directive) No - Patient declined - -    Tobacco Social History   Tobacco Use  Smoking Status Never Smoker  Smokeless Tobacco Never Used     Counseling given: Not Answered   Clinical Intake:  Pre-visit preparation completed: Yes  Pain : 0-10 Pain Score: 4  Pain Type: Acute pain Pain Location: Groin Pain Orientation: Lower Pain Descriptors / Indicators: Pressure     Nutritional Status: BMI 25 -29 Overweight Nutritional Risks: Nausea/ vomitting/ diarrhea(nausea occasionally - unsure the cause) Diabetes: Yes(type 2) CBG done?: No Did pt. bring in CBG monitor from home?: No  How often do you need to have someone help you when you read instructions, pamphlets, or other written materials from your doctor or pharmacy?: 1 - Never  Interpreter Needed?:  No  Information entered by :: [facility], LPN  Past Medical History:  Diagnosis Date  . A-fib ([place])   . CHF (congestive heart failure) ([place])   . Hypertension   . Sleep apnea    Past Surgical History:  Procedure Laterality Date  . EYE SURGERY    . POLYPECTOMY     colon poly premoved   Family History  Problem Relation Age of Onset  . Hypertension Mother   . Heart disease Mother   . CVA Mother   . Heart attack Mother   . Lung cancer Father   . Heart attack Sister   . CVA Sister   . Thyroid disease Sister   . Liver cancer Brother   . Thyroid disease Sister   . Thyroid disease Sister   . Diabetes Sister   . Dementia Sister   . Atrial fibrillation Sister    Social History   Socioeconomic History  . Marital status: Widowed    Spouse name: Not on file  . Number of children: 3  . Years of education: HS  . Highest education level: 12th grade  Occupational History    Employer: RETIRED  Social Needs  . Financial resource strain: Not hard at all  . Food insecurity:    Worry: Never true    Inability: Never true  . Transportation needs:    Medical: Yes    Non-medical: Yes  Tobacco Use  . Smoking status: Never Smoker  .  Smokeless tobacco: Never Used  Substance and Sexual Activity  . Alcohol use: No  . Drug use: No  . Sexual activity: Not on file  Lifestyle  . Physical activity:    Days per week: 0 days    Minutes per session: 0 min  . Stress: Only a little  Relationships  . Social connections:    Talks on phone: More than three times a week    Gets together: More than three times a week    Attends religious service: More than 4 times per year    Active member of club or organization: No    Attends meetings of clubs or organizations: Never    Relationship status: Widowed  Other Topics Concern  . Not on file  Social History Narrative  . Not on file    Outpatient Encounter Medications as of 07/15/2017  Medication Sig  . albuterol (PROVENTIL HFA;VENTOLIN HFA)  108 (90 Base) MCG/ACT inhaler Inhale 2 puffs into the lungs every 6 (six) hours as needed for wheezing or shortness of breath.  . ALPRAZolam (XANAX) 0.5 MG tablet TAKE 1 TABLET BY MOUTH AT BEDTIME AS NEEDED  . cyanocobalamin 1000 MCG tablet Take 1,000 mcg by mouth daily.  Marland Kitchen diltiazem (CARDIZEM) 120 MG tablet Take 120 mg by mouth 2 times daily at 12 noon and 4 pm.   . flecainide (TAMBOCOR) 50 MG tablet Take 50 mg by mouth 2 (two) times daily.   Marland Kitchen gabapentin (NEURONTIN) 100 MG capsule TAKE 1 CAPSULE (100 MG TOTAL) BY MOUTH 3 (THREE) TIMES DAILY.  Marland Kitchen glycerin adult 2 g suppository Place 1 suppository rectally as needed for constipation.  . magnesium oxide (MAG-OX) 400 MG tablet Take 400 mg by mouth daily.  . metolazone (ZAROXOLYN) 2.5 MG tablet Take 1 tablet (2.5 mg total) by mouth once a week.  . metoprolol tartrate (LOPRESSOR) 100 MG tablet Take 1 tablet (100 mg total) by mouth 2 (two) times daily.  Marland Kitchen omeprazole (PRILOSEC) 20 MG capsule Take 1 capsule (20 mg total) by mouth at bedtime.  . polyethylene glycol powder (GLYCOLAX/MIRALAX) powder Take 17 g by mouth 2 (two) times daily as needed.  . potassium chloride SA (K-DUR,KLOR-CON) 20 MEQ tablet Take 2 tablets (40 mEq total) by mouth 2 (two) times daily. And an additional 58meq ever Wednesday with metolazone  . prochlorperazine (COMPAZINE) 10 MG tablet Take 1 tablet (10 mg total) by mouth every 8 (eight) hours as needed for nausea or vomiting.  . Pyridoxine HCl (VITAMIN B6) 200 MG TABS Take 200 tablets by mouth 2 (two) times daily.   Marland Kitchen senna-docusate (SENOKOT-S) 8.6-50 MG tablet Take 1 tablet by mouth daily.  Marland Kitchen torsemide (DEMADEX) 20 MG tablet Take 2 tablets (40 mg total) by mouth 2 (two) times daily.  Marland Kitchen warfarin (COUMADIN) 2 MG tablet Take 2 mg by mouth every other day. Take 2mg  one day and take 2.5mg  next day, alternating.   No facility-administered encounter medications on file as of 07/15/2017.     Activities of Daily Living In your present  state of health, do you have any difficulty performing the following activities: 07/15/2017 07/13/2017  Hearing? N N  Vision? N N  Difficulty concentrating or making decisions? N N  Walking or climbing stairs? Y Y  Comment Was told to avoid steps. -  Dressing or bathing? N N  Doing errands, shopping? Tempie Donning  Comment Has not drove in the last 6 months.  -  Preparing Food and eating ? N -  Using the Toilet? N -  In the past six months, have you accidently leaked urine? N -  Do you have problems with loss of bowel control? N -  Managing your Medications? N -  Managing your Finances? N -  Housekeeping or managing your Housekeeping? N -  Some recent data might be hidden    Patient Care Team: Jerrol Banana., MD as PCP - General (Family Medicine) Alisa Graff, FNP as Nurse Practitioner (Family Medicine) Isaias Cowman, MD as Consulting Physician (Cardiology) Flora Lipps, MD as Consulting Physician (Pulmonary Disease) Anell Barr, OD as Consulting Physician (Optometry)    Assessment:   This is a routine wellness examination for Hannah Faulkner.  Exercise Activities and Dietary recommendations Current Exercise Habits: Home exercise routine(Has PT come out to home once a week.), Type of exercise: stretching, Time (Minutes): 10, Frequency (Times/Week): 5, Weekly Exercise (Minutes/Week): 50, Intensity: Mild, Exercise limited by: orthopedic condition(s)  Goals    . DIET - INCREASE WATER INTAKE     Recommend to continue drinking 6-8 glasses of water a day.        Fall Risk Fall Risk  07/15/2017 07/13/2017 05/15/2017 02/11/2017 01/20/2017  Falls in the past year? No No No No No   Is the patient's home free of loose throw rugs in walkways, pet beds, electrical cords, etc?   yes      Grab bars in the bathroom? yes      Handrails on the stairs?   yes      Adequate lighting?   yes  Timed Get Up and Go performed: N/A  Depression Screen PHQ 2/9 Scores 07/15/2017 07/13/2017 05/15/2017  02/11/2017  PHQ - 2 Score 2 0 0 0  PHQ- 9 Score 5 - - -     Cognitive Function: Pt declined screening today.         Immunization History  Administered Date(s) Administered  . Influenza Split 01/17/2006, 01/21/2012  . Influenza, High Dose Seasonal PF 02/08/2014, 01/17/2015, 01/10/2016, 12/11/2016  . Influenza,inj,Quad PF,6+ Mos 01/10/2013  . Pneumococcal Conjugate-13 02/08/2014  . Pneumococcal Polysaccharide-23 01/30/2002, 01/21/2012  . Td 10/17/2009  . Tdap 01/21/2012  . Zoster 02/21/2013    Qualifies for Shingles Vaccine? Due for Shingles vaccine. Declined my offer to administer today. Education has been provided regarding the importance of this vaccine. Pt has been advised to call her insurance company to determine her out of pocket expense. Advised she may also receive this vaccine at her local pharmacy or Health Dept. Verbalized acceptance and understanding.  Screening Tests Health Maintenance  Topic Date Due  . FOOT EXAM  08/09/1950  . OPHTHALMOLOGY EXAM  08/09/1950  . URINE MICROALBUMIN  11/11/2016  . HEMOGLOBIN A1C  10/19/2017  . INFLUENZA VACCINE  10/22/2017  . TETANUS/TDAP  01/20/2022  . DEXA SCAN  Completed  . PNA vac Low Risk Adult  Completed    Cancer Screenings: Lung: Low Dose CT Chest recommended if Age 33-80 years, 30 pack-year currently smoking OR have quit w/in 15years. Patient does not qualify. Breast:  Up to date on Mammogram? Yes   Up to date of Bone Density/Dexa? Yes Colorectal: Up to date  Additional Screenings:  Hepatitis C Screening: N/A     Plan:  I have personally reviewed and addressed the Medicare Annual Wellness questionnaire and have noted the following in the patient's chart:  A. Medical and social history B. Use of alcohol, tobacco or illicit drugs  C. Current medications and supplements D. Functional ability  and status E.  Nutritional status F.  Physical activity G. Advance directives H. List of other physicians I.    Hospitalizations, surgeries, and ER visits in previous 12 months J.  [place] such as hearing and vision if needed, cognitive and depression L. Referrals and appointments - none  In addition, I have reviewed and discussed with patient certain preventive protocols, quality metrics, and best practice recommendations. A written personalized care plan for preventive services as well as general preventive health recommendations were provided to patient.  See attached scanned questionnaire for additional information.   Signed,  Fabio Neighbors, LPN Nurse Health Advisor   Nurse Recommendations: Pt needs a diabetic foot exam and urine microalbumin checked at next OV. Pt plans to schedule an eye exam this year. Pt was c/o pressure in her pelvic area. Spoke with PCP's assistant who stated to get a urine sample and they would place the order. Pt was d/c from the hospital on 06/29/17 with a UTI and was placed on Keflex 500 mg 1 tablet BID x 1 day. Urine specimen obtained- see TE.

## 2017-07-15 NOTE — Telephone Encounter (Signed)
Patient was seen today by the [facility] and complained about a possible UTI.  I have advised that we would collect the urine and send for Urine C&S if OK with Dr. Rosanna Randy.  ED

## 2017-07-15 NOTE — Patient Instructions (Addendum)
Hannah Faulkner , Thank you for taking time to come for your Medicare Wellness Visit. I appreciate your ongoing commitment to your health goals. Please review the following plan we discussed and let me know if I can assist you in the future.   Screening recommendations/referrals: Colonoscopy: Up to date Mammogram: Up to date Bone Density: Up to date Recommended yearly ophthalmology/optometry visit for glaucoma screening and checkup Recommended yearly dental visit for hygiene and checkup  Vaccinations: Influenza vaccine: Up to date Pneumococcal vaccine: Up to date Tdap vaccine: Up to date Shingles vaccine: Pt declines today.     Advanced directives: Please bring a copy of your POA (Power of Attorney) and/or Living Will to your next appointment.   Conditions/risks identified: Recommend to continue drinking 6-8 glasses of water a day.   Next appointment: 09/15/17 @ 2:00 PM with Dr Rosanna Randy   Preventive Care 32 Years and Older, Female Preventive care refers to lifestyle choices and visits with your health care provider that can promote health and wellness. What does preventive care include?  A yearly physical exam. This is also called an annual well check.  Dental exams once or twice a year.  Routine eye exams. Ask your health care provider how often you should have your eyes checked.  Personal lifestyle choices, including:  Daily care of your teeth and gums.  Regular physical activity.  Eating a healthy diet.  Avoiding tobacco and drug use.  Limiting alcohol use.  Practicing safe sex.  Taking low-dose aspirin every day.  Taking vitamin and mineral supplements as recommended by your health care provider. What happens during an annual well check? The services and screenings done by your health care provider during your annual well check will depend on your age, overall health, lifestyle risk factors, and family history of disease. Counseling  Your health care provider may ask  you questions about your:  Alcohol use.  Tobacco use.  Drug use.  Emotional well-being.  Home and relationship well-being.  Sexual activity.  Eating habits.  History of falls.  Memory and ability to understand (cognition).  Work and work Statistician.  Reproductive health. Screening  You may have the following tests or measurements:  Height, weight, and BMI.  Blood pressure.  Lipid and cholesterol levels. These may be checked every 5 years, or more frequently if you are over 40 years old.  Skin check.  Lung cancer screening. You may have this screening every year starting at age 40 if you have a 30-pack-year history of smoking and currently smoke or have quit within the past 15 years.  Fecal occult blood test (FOBT) of the stool. You may have this test every year starting at age 26.  Flexible sigmoidoscopy or colonoscopy. You may have a sigmoidoscopy every 5 years or a colonoscopy every 10 years starting at age 54.  Hepatitis C blood test.  Hepatitis B blood test.  Sexually transmitted disease (STD) testing.  Diabetes screening. This is done by checking your blood sugar (glucose) after you have not eaten for a while (fasting). You may have this done every 1-3 years.  Bone density scan. This is done to screen for osteoporosis. You may have this done starting at age 14.  Mammogram. This may be done every 1-2 years. Talk to your health care provider about how often you should have regular mammograms. Talk with your health care provider about your test results, treatment options, and if necessary, the need for more tests. Vaccines  Your health care provider may  recommend certain vaccines, such as:  Influenza vaccine. This is recommended every year.  Tetanus, diphtheria, and acellular pertussis (Tdap, Td) vaccine. You may need a Td booster every 10 years.  Zoster vaccine. You may need this after age 63.  Pneumococcal 13-valent conjugate (PCV13) vaccine. One dose  is recommended after age 57.  Pneumococcal polysaccharide (PPSV23) vaccine. One dose is recommended after age 42. Talk to your health care provider about which screenings and vaccines you need and how often you need them. This information is not intended to replace advice given to you by your health care provider. Make sure you discuss any questions you have with your health care provider. Document Released: 04/06/2015 Document Revised: 11/28/2015 Document Reviewed: 01/09/2015 Elsevier Interactive Patient Education  2017 [place] Prevention in the Home Falls can cause injuries. They can happen to people of all ages. There are many things you can do to make your home safe and to help prevent falls. What can I do on the outside of my home?  Regularly fix the edges of walkways and driveways and fix any cracks.  Remove anything that might make you trip as you walk through a door, such as a raised step or threshold.  Trim any bushes or trees on the path to your home.  Use bright outdoor lighting.  Clear any walking paths of anything that might make someone trip, such as rocks or tools.  Regularly check to see if handrails are loose or broken. Make sure that both sides of any steps have handrails.  Any raised decks and porches should have guardrails on the edges.  Have any leaves, snow, or ice cleared regularly.  Use sand or salt on walking paths during winter.  Clean up any spills in your garage right away. This includes oil or grease spills. What can I do in the bathroom?  Use night lights.  Install grab bars by the toilet and in the tub and shower. Do not use towel bars as grab bars.  Use non-skid mats or decals in the tub or shower.  If you need to sit down in the shower, use a plastic, non-slip stool.  Keep the floor dry. Clean up any water that spills on the floor as soon as it happens.  Remove soap buildup in the tub or shower regularly.  Attach bath mats  securely with double-sided non-slip rug tape.  Do not have throw rugs and other things on the floor that can make you trip. What can I do in the bedroom?  Use night lights.  Make sure that you have a light by your bed that is easy to reach.  Do not use any sheets or blankets that are too big for your bed. They should not hang down onto the floor.  Have a firm chair that has side arms. You can use this for support while you get dressed.  Do not have throw rugs and other things on the floor that can make you trip. What can I do in the kitchen?  Clean up any spills right away.  Avoid walking on wet floors.  Keep items that you use a lot in easy-to-reach places.  If you need to reach something above you, use a strong step stool that has a grab bar.  Keep electrical cords out of the way.  Do not use floor polish or wax that makes floors slippery. If you must use wax, use non-skid floor wax.  Do not have throw rugs and  other things on the floor that can make you trip. What can I do with my stairs?  Do not leave any items on the stairs.  Make sure that there are handrails on both sides of the stairs and use them. Fix handrails that are broken or loose. Make sure that handrails are as long as the stairways.  Check any carpeting to make sure that it is firmly attached to the stairs. Fix any carpet that is loose or worn.  Avoid having throw rugs at the top or bottom of the stairs. If you do have throw rugs, attach them to the floor with carpet tape.  Make sure that you have a light switch at the top of the stairs and the bottom of the stairs. If you do not have them, ask someone to add them for you. What else can I do to help prevent falls?  Wear shoes that:  Do not have high heels.  Have rubber bottoms.  Are comfortable and fit you well.  Are closed at the toe. Do not wear sandals.  If you use a stepladder:  Make sure that it is fully opened. Do not climb a closed  stepladder.  Make sure that both sides of the stepladder are locked into place.  Ask someone to hold it for you, if possible.  Clearly mark and make sure that you can see:  Any grab bars or handrails.  First and last steps.  Where the edge of each step is.  Use tools that help you move around (mobility aids) if they are needed. These include:  Canes.  Walkers.  Scooters.  Crutches.  Turn on the lights when you go into a dark area. Replace any light bulbs as soon as they burn out.  Set up your furniture so you have a clear path. Avoid moving your furniture around.  If any of your floors are uneven, fix them.  If there are any pets around you, be aware of where they are.  Review your medicines with your doctor. Some medicines can make you feel dizzy. This can increase your chance of falling. Ask your doctor what other things that you can do to help prevent falls. This information is not intended to replace advice given to you by your health care provider. Make sure you discuss any questions you have with your health care provider. Document Released: 01/04/2009 Document Revised: 08/16/2015 Document Reviewed: 04/14/2014 Elsevier Interactive Patient Education  2017 [employer].

## 2017-07-17 DIAGNOSIS — I481 Persistent atrial fibrillation: Secondary | ICD-10-CM | POA: Diagnosis not present

## 2017-07-17 DIAGNOSIS — R0689 Other abnormalities of breathing: Secondary | ICD-10-CM

## 2017-07-17 DIAGNOSIS — G4733 Obstructive sleep apnea (adult) (pediatric): Secondary | ICD-10-CM | POA: Diagnosis not present

## 2017-07-17 DIAGNOSIS — R0600 Dyspnea, unspecified: Secondary | ICD-10-CM | POA: Insufficient documentation

## 2017-07-17 DIAGNOSIS — I5032 Chronic diastolic (congestive) heart failure: Secondary | ICD-10-CM | POA: Diagnosis not present

## 2017-07-17 LAB — URINE CULTURE

## 2017-07-20 ENCOUNTER — Telehealth: Payer: Self-pay

## 2017-07-20 NOTE — Telephone Encounter (Signed)
LMTCB 07/20/2017  Thanks,   -Mickel Baas

## 2017-07-20 NOTE — Telephone Encounter (Signed)
-----   Message from Jerrol Banana., MD sent at 07/17/2017 11:50 AM EDT ----- No UTI

## 2017-07-20 NOTE — Telephone Encounter (Signed)
Pt advised.   Thanks,   -Whittney Steenson  

## 2017-07-21 DIAGNOSIS — N390 Urinary tract infection, site not specified: Secondary | ICD-10-CM | POA: Diagnosis not present

## 2017-07-21 DIAGNOSIS — I110 Hypertensive heart disease with heart failure: Secondary | ICD-10-CM | POA: Diagnosis not present

## 2017-07-21 DIAGNOSIS — I5032 Chronic diastolic (congestive) heart failure: Secondary | ICD-10-CM | POA: Diagnosis not present

## 2017-07-21 DIAGNOSIS — G4733 Obstructive sleep apnea (adult) (pediatric): Secondary | ICD-10-CM | POA: Diagnosis not present

## 2017-07-21 DIAGNOSIS — K5641 Fecal impaction: Secondary | ICD-10-CM | POA: Diagnosis not present

## 2017-07-21 DIAGNOSIS — J9611 Chronic respiratory failure with hypoxia: Secondary | ICD-10-CM | POA: Diagnosis not present

## 2017-07-21 DIAGNOSIS — I480 Paroxysmal atrial fibrillation: Secondary | ICD-10-CM | POA: Diagnosis not present

## 2017-07-21 DIAGNOSIS — Z7901 Long term (current) use of anticoagulants: Secondary | ICD-10-CM | POA: Diagnosis not present

## 2017-07-21 DIAGNOSIS — Z9981 Dependence on supplemental oxygen: Secondary | ICD-10-CM | POA: Diagnosis not present

## 2017-07-28 DIAGNOSIS — J9611 Chronic respiratory failure with hypoxia: Secondary | ICD-10-CM | POA: Diagnosis not present

## 2017-07-28 DIAGNOSIS — I480 Paroxysmal atrial fibrillation: Secondary | ICD-10-CM | POA: Diagnosis not present

## 2017-07-28 DIAGNOSIS — I5032 Chronic diastolic (congestive) heart failure: Secondary | ICD-10-CM | POA: Diagnosis not present

## 2017-07-28 DIAGNOSIS — I110 Hypertensive heart disease with heart failure: Secondary | ICD-10-CM | POA: Diagnosis not present

## 2017-07-28 DIAGNOSIS — G4733 Obstructive sleep apnea (adult) (pediatric): Secondary | ICD-10-CM | POA: Diagnosis not present

## 2017-07-28 DIAGNOSIS — N390 Urinary tract infection, site not specified: Secondary | ICD-10-CM | POA: Diagnosis not present

## 2017-07-28 DIAGNOSIS — K5641 Fecal impaction: Secondary | ICD-10-CM | POA: Diagnosis not present

## 2017-07-28 DIAGNOSIS — Z9981 Dependence on supplemental oxygen: Secondary | ICD-10-CM | POA: Diagnosis not present

## 2017-07-28 DIAGNOSIS — Z7901 Long term (current) use of anticoagulants: Secondary | ICD-10-CM | POA: Diagnosis not present

## 2017-07-29 DIAGNOSIS — Z9981 Dependence on supplemental oxygen: Secondary | ICD-10-CM | POA: Diagnosis not present

## 2017-07-29 DIAGNOSIS — N390 Urinary tract infection, site not specified: Secondary | ICD-10-CM | POA: Diagnosis not present

## 2017-07-29 DIAGNOSIS — I5032 Chronic diastolic (congestive) heart failure: Secondary | ICD-10-CM | POA: Diagnosis not present

## 2017-07-29 DIAGNOSIS — Z7901 Long term (current) use of anticoagulants: Secondary | ICD-10-CM | POA: Diagnosis not present

## 2017-07-29 DIAGNOSIS — K5641 Fecal impaction: Secondary | ICD-10-CM | POA: Diagnosis not present

## 2017-07-29 DIAGNOSIS — G4733 Obstructive sleep apnea (adult) (pediatric): Secondary | ICD-10-CM | POA: Diagnosis not present

## 2017-07-29 DIAGNOSIS — I480 Paroxysmal atrial fibrillation: Secondary | ICD-10-CM | POA: Diagnosis not present

## 2017-07-29 DIAGNOSIS — J9611 Chronic respiratory failure with hypoxia: Secondary | ICD-10-CM | POA: Diagnosis not present

## 2017-07-29 DIAGNOSIS — I110 Hypertensive heart disease with heart failure: Secondary | ICD-10-CM | POA: Diagnosis not present

## 2017-08-06 ENCOUNTER — Other Ambulatory Visit: Payer: Self-pay | Admitting: Family Medicine

## 2017-08-09 NOTE — Progress Notes (Signed)
Patient ID: Hannah Faulkner, female    DOB: [birth year], 77 y.o.   MRN: [id number]  HPI  Hannah Faulkner is a 77 yo female with a PMH of atrial fibrillation, HTN, restless leg syndrome, and CHF.   Last echo on 08/07/16 which showed an EF of 60-65%.  Admitted 06/27/17 due to atrial fibrillation with RVR along with supratherapeutic INR level. Was given vitamin K. Initially needed cardizem drip and then transitioned to oral medications. Cardiology consult obtained and Hannah Faulkner was discharged after 2 days. Was in the ED 06/18/17 due to fecal impaction where Hannah Faulkner was treated and released. Admitted 05/21/17 due to atrial fibrillation with RVR along with pneumonia. Was given antibiotics. Cardiology consult obtained. Discharged after 2 days.      Hannah Faulkner presents for a follow-up visit with a chief complaint of minimal fatigue upon moderate exertion. Hannah Faulkner describes this as being chronic in nature having been present for several years. Hannah Faulkner has associated pedal edema along with this. Hannah Faulkner denies any difficulty sleeping, abdominal distention, palpitations, chest pain, shortness of breath, cough, dizziness or weight gain. In fact, Hannah Faulkner's lost quite a bit of weight since Hannah Faulkner was last here.   Past Medical History:  Diagnosis Date  . A-fib ([place])   . CHF (congestive heart failure) ([place])   . Hypertension   . Sleep apnea    Past Surgical History:  Procedure Laterality Date  . EYE SURGERY    . POLYPECTOMY     colon poly premoved   Family History  Problem Relation Age of Onset  . Hypertension Mother   . Heart disease Mother   . CVA Mother   . Heart attack Mother   . Lung cancer Father   . Heart attack Sister   . CVA Sister   . Thyroid disease Sister   . Liver cancer Brother   . Thyroid disease Sister   . Thyroid disease Sister   . Diabetes Sister   . Dementia Sister   . Atrial fibrillation Sister    Social History   Tobacco Use  . Smoking status: Never Smoker  . Smokeless tobacco: Never Used  Substance Use Topics   . Alcohol use: No   Allergies  Allergen Reactions  . Ampicillin   . Prinzide  [Lisinopril-Hydrochlorothiazide]   . Penicillins Rash    .Has patient had a PCN reaction causing immediate rash, facial/tongue/throat swelling, SOB or lightheadedness with hypotension: Unknown Has patient had a PCN reaction causing severe rash involving mucus membranes or skin necrosis: No Has patient had a PCN reaction that required hospitalization: No Has patient had a PCN reaction occurring within the last 10 years: No If all of the above answers are "NO", then may proceed with Cephalosporin use.    Prior to Admission medications   Medication Sig Start Date End Date Taking? Authorizing Provider  albuterol (PROVENTIL HFA;VENTOLIN HFA) 108 (90 Base) MCG/ACT inhaler Inhale 2 puffs into the lungs every 6 (six) hours as needed for wheezing or shortness of breath.   Yes [provider]  ALPRAZolam Duanne Moron) 0.5 MG tablet TAKE 1 TABLET BY MOUTH AT BEDTIME AS NEEDED 08/06/17  Yes Jerrol Banana., MD  cyanocobalamin 1000 MCG tablet Take 1,000 mcg by mouth daily.   Yes [provider]  diltiazem (CARDIZEM) 120 MG tablet Take 120 mg by mouth 2 times daily at 12 noon and 4 pm.    Yes [provider]  flecainide (TAMBOCOR) 50 MG tablet Take 50 mg by mouth 2 (two)  times daily.  11/16/13  Yes [provider]  gabapentin (NEURONTIN) 100 MG capsule TAKE 1 CAPSULE (100 MG TOTAL) BY MOUTH 3 (THREE) TIMES DAILY. 12/21/16  Yes Jerrol Banana., MD  glycerin adult 2 g suppository Place 1 suppository rectally as needed for constipation. 06/18/17  Yes Merlyn Lot, MD  magnesium oxide (MAG-OX) 400 MG tablet Take 400 mg by mouth daily.   Yes [provider]  metoprolol tartrate (LOPRESSOR) 100 MG tablet Take 1 tablet (100 mg total) by mouth 2 (two) times daily. 08/31/16  Yes Theodoro Grist, MD  omeprazole (PRILOSEC) 20 MG capsule Take 1 capsule (20 mg total) by mouth at bedtime.  04/15/17  Yes Jerrol Banana., MD  polyethylene glycol powder [facility]) powder Take 17 g by mouth 2 (two) times daily as needed. 06/16/17  Yes Jerrol Banana., MD  potassium chloride SA (K-DUR,KLOR-CON) 20 MEQ tablet Take 2 tablets (40 mEq total) by mouth 2 (two) times daily. And an additional 15meq ever Wednesday with metolazone 03/09/17  Yes Darylene Price A, FNP  prochlorperazine (COMPAZINE) 10 MG tablet Take 1 tablet (10 mg total) by mouth every 8 (eight) hours as needed for nausea or vomiting. 09/05/16  Yes Birdie Sons, MD  Pyridoxine HCl (VITAMIN B6) 200 MG TABS Take 200 tablets by mouth 2 (two) times daily.  11/16/13  Yes [provider]  senna-docusate (SENOKOT-S) 8.6-50 MG tablet Take 1 tablet by mouth daily. 06/16/17  Yes Jerrol Banana., MD  torsemide (DEMADEX) 20 MG tablet Take 2 tablets (40 mg total) by mouth 2 (two) times daily. 05/15/17  Yes Darylene Price A, FNP  warfarin (COUMADIN) 2 MG tablet Take 2 mg by mouth every other day. Take 2mg  one day and take 2.5mg  next day, alternating.   Yes [provider]  metolazone (ZAROXOLYN) 2.5 MG tablet Take 1 tablet (2.5 mg total) by mouth once a week. 05/15/17 06/27/17  Alisa Graff, FNP   Review of Systems  Constitutional: Positive for fatigue. Negative for appetite change.  HENT: Negative for congestion, postnasal drip and sore throat.   Eyes: Negative.   Respiratory: Negative for cough, chest tightness and shortness of breath.   Cardiovascular: Positive for leg swelling (right > left (better)). Negative for chest pain and palpitations.  Gastrointestinal: Negative for abdominal distention and abdominal pain.  Endocrine: Negative.   Genitourinary: Negative.   Musculoskeletal: Negative for arthralgias, back pain and neck pain.  Skin: Negative.   Allergic/Immunologic: Negative.   Neurological: Negative for dizziness and light-headedness.  Hematological: Negative for adenopathy. Does not  bruise/bleed easily.  Psychiatric/Behavioral: Negative for dysphoric mood and sleep disturbance (wearing oxygen on 2L). The patient is not nervous/anxious.     Vitals:   08/10/17 1613  BP: 120/68  Pulse: (!) 122  Resp: 18  SpO2: 93%  Weight: 146 lb 8 oz (66.5 kg)  Height: 5\' 1"  (1.549 m)   Wt Readings from Last 3 Encounters:  08/10/17 146 lb 8 oz (66.5 kg)  07/15/17 155 lb 9.6 oz (70.6 kg)  07/13/17 159 lb 4 oz (72.2 kg)   Lab Results  Component Value Date   CREATININE 0.78 06/27/2017   CREATININE 0.84 06/18/2017   CREATININE 1.20 (H) 05/22/2017    Physical Exam  Constitutional: Hannah Faulkner is oriented to person, place, and time. Hannah Faulkner appears well-developed and well-nourished.  HENT:  Head: Normocephalic and atraumatic.  Neck: Normal range of motion. Neck supple. No JVD present.  Cardiovascular: Normal rate. An  irregular rhythm present.  Pulmonary/Chest: Effort normal. Hannah Faulkner has no wheezes. Hannah Faulkner has no rales.  Abdominal: Soft. Hannah Faulkner exhibits no distension. There is no tenderness.  Musculoskeletal: Hannah Faulkner exhibits edema (1+ pitting edema in right lower leg). Hannah Faulkner exhibits no tenderness.  Neurological: Hannah Faulkner is alert and oriented to person, place, and time.  Skin: Skin is warm and dry.  Multiple spider veins noted in bilateral lower legs/feet with more on the right  Psychiatric: Hannah Faulkner has a normal mood and affect. Hannah Faulkner behavior is normal. Thought content normal.  Nursing note and vitals reviewed.  Assessment & Plan 1: Chronic heart failure with preserved ejection fraction- - NYHA class II - euvolemic today - weighing daily and writing weight down. Continue to weigh daily and to call for an overnight weight gain > 2 pounds or a weekly weight gain > 5 pounds;  - weight down 13 pounds from the last time Hannah Faulkner was here  - advised Hannah Faulkner to hold the metolazone for now and if Hannah Faulkner gains weight per above or edema worsens, Hannah Faulkner can take it - using salt when cooking foods. Reminded to closely follow a 2000 mg  sodium diet.  - saw cardiology ([facility]) 06/01/17 - wearing oxygen at 2L PRN  2: HTN- - BP looks good today - saw PCP Rosanna Randy) 06/27/17  - BMP from 06/27/17 reviewed an shows sodium 136, potassium 5.2 and GFR >60   3: Atrial fibrillation- - continues on metoprolol tartrate, diltiazem, and flecainide - continues with warfarin 2mg  QOD - INR 5.06 on 06/29/17 (was >10.00 on 06/27/17)  - saw cardiology Marcello Moores @ [facility]) 07/17/17; hasn't heard anything from them regarding definite plan (amiodarone, pacemaker, ablation, cardioversion have all been discussed). Contact information given to son and he says that they will call and follow-up  4: Lymphedema- - stage 2 lymphedema - trying to elevate Hannah Faulkner legs more often - has worn compression socks in the past but inconsistently because Hannah Faulkner says that Hannah Faulkner has difficulty in putting them on - mom lost Hannah Faulkner leg due to poor circulation - wearing Hannah Faulkner compression boots daily for about 10 minutes at a time  Patient did not bring Hannah Faulkner medications nor a list. Each medication was verbally reviewed with the patient and Hannah Faulkner was encouraged to bring the bottles to every visit to confirm accuracy of list.  Return in 3 months or sooner for any questions/problems before then.

## 2017-08-10 ENCOUNTER — Ambulatory Visit: Payer: Medicare Other | Attending: Family | Admitting: Family

## 2017-08-10 ENCOUNTER — Encounter: Payer: Self-pay | Admitting: Family

## 2017-08-10 VITALS — BP 120/68 | HR 122 | Resp 18 | Ht 61.0 in | Wt 146.5 lb

## 2017-08-10 DIAGNOSIS — Z800 Family history of malignant neoplasm of digestive organs: Secondary | ICD-10-CM | POA: Diagnosis not present

## 2017-08-10 DIAGNOSIS — Z801 Family history of malignant neoplasm of trachea, bronchus and lung: Secondary | ICD-10-CM | POA: Insufficient documentation

## 2017-08-10 DIAGNOSIS — I110 Hypertensive heart disease with heart failure: Secondary | ICD-10-CM | POA: Diagnosis not present

## 2017-08-10 DIAGNOSIS — I5032 Chronic diastolic (congestive) heart failure: Secondary | ICD-10-CM | POA: Diagnosis not present

## 2017-08-10 DIAGNOSIS — I4891 Unspecified atrial fibrillation: Secondary | ICD-10-CM | POA: Insufficient documentation

## 2017-08-10 DIAGNOSIS — Z880 Allergy status to penicillin: Secondary | ICD-10-CM | POA: Diagnosis not present

## 2017-08-10 DIAGNOSIS — I10 Essential (primary) hypertension: Secondary | ICD-10-CM

## 2017-08-10 DIAGNOSIS — Z823 Family history of stroke: Secondary | ICD-10-CM | POA: Diagnosis not present

## 2017-08-10 DIAGNOSIS — I890 Lymphedema, not elsewhere classified: Secondary | ICD-10-CM | POA: Diagnosis not present

## 2017-08-10 DIAGNOSIS — Z7901 Long term (current) use of anticoagulants: Secondary | ICD-10-CM | POA: Insufficient documentation

## 2017-08-10 DIAGNOSIS — Z8349 Family history of other endocrine, nutritional and metabolic diseases: Secondary | ICD-10-CM | POA: Diagnosis not present

## 2017-08-10 DIAGNOSIS — Z9889 Other specified postprocedural states: Secondary | ICD-10-CM | POA: Insufficient documentation

## 2017-08-10 DIAGNOSIS — Z79899 Other long term (current) drug therapy: Secondary | ICD-10-CM | POA: Diagnosis not present

## 2017-08-10 DIAGNOSIS — Z833 Family history of diabetes mellitus: Secondary | ICD-10-CM | POA: Insufficient documentation

## 2017-08-10 DIAGNOSIS — Z8249 Family history of ischemic heart disease and other diseases of the circulatory system: Secondary | ICD-10-CM | POA: Diagnosis not present

## 2017-08-10 DIAGNOSIS — Z5181 Encounter for therapeutic drug level monitoring: Secondary | ICD-10-CM | POA: Diagnosis not present

## 2017-08-10 DIAGNOSIS — I480 Paroxysmal atrial fibrillation: Secondary | ICD-10-CM

## 2017-08-10 DIAGNOSIS — G4730 Sleep apnea, unspecified: Secondary | ICD-10-CM | POA: Insufficient documentation

## 2017-08-10 DIAGNOSIS — Z818 Family history of other mental and behavioral disorders: Secondary | ICD-10-CM | POA: Diagnosis not present

## 2017-08-10 NOTE — Patient Instructions (Addendum)
Continue weighing daily and call for an overnight weight gain of > 2 pounds or a weekly weight gain of >5 pounds.    Wyona Almas MD  [phone] (Work) [phone] (Fax) [facility] [address] [place], [place] [address]

## 2017-08-12 DIAGNOSIS — N390 Urinary tract infection, site not specified: Secondary | ICD-10-CM | POA: Diagnosis not present

## 2017-08-12 DIAGNOSIS — I110 Hypertensive heart disease with heart failure: Secondary | ICD-10-CM | POA: Diagnosis not present

## 2017-08-12 DIAGNOSIS — I480 Paroxysmal atrial fibrillation: Secondary | ICD-10-CM | POA: Diagnosis not present

## 2017-08-12 DIAGNOSIS — G4733 Obstructive sleep apnea (adult) (pediatric): Secondary | ICD-10-CM | POA: Diagnosis not present

## 2017-08-12 DIAGNOSIS — I5032 Chronic diastolic (congestive) heart failure: Secondary | ICD-10-CM | POA: Diagnosis not present

## 2017-08-12 DIAGNOSIS — K5641 Fecal impaction: Secondary | ICD-10-CM | POA: Diagnosis not present

## 2017-08-12 DIAGNOSIS — Z9981 Dependence on supplemental oxygen: Secondary | ICD-10-CM | POA: Diagnosis not present

## 2017-08-12 DIAGNOSIS — Z7901 Long term (current) use of anticoagulants: Secondary | ICD-10-CM | POA: Diagnosis not present

## 2017-08-12 DIAGNOSIS — J9611 Chronic respiratory failure with hypoxia: Secondary | ICD-10-CM | POA: Diagnosis not present

## 2017-08-13 ENCOUNTER — Telehealth: Payer: Self-pay

## 2017-08-13 NOTE — Telephone Encounter (Signed)
05/14/16 Mammogram ordered not resulted

## 2017-08-14 DIAGNOSIS — I5032 Chronic diastolic (congestive) heart failure: Secondary | ICD-10-CM | POA: Diagnosis not present

## 2017-09-14 DIAGNOSIS — I5032 Chronic diastolic (congestive) heart failure: Secondary | ICD-10-CM | POA: Diagnosis not present

## 2017-09-15 ENCOUNTER — Ambulatory Visit (INDEPENDENT_AMBULATORY_CARE_PROVIDER_SITE_OTHER): Payer: Medicare Other | Admitting: Family Medicine

## 2017-09-15 ENCOUNTER — Encounter: Payer: Self-pay | Admitting: Family Medicine

## 2017-09-15 VITALS — BP 104/64 | HR 108 | Temp 98.1°F | Resp 16 | Ht 61.0 in | Wt 152.0 lb

## 2017-09-15 DIAGNOSIS — E0842 Diabetes mellitus due to underlying condition with diabetic polyneuropathy: Secondary | ICD-10-CM

## 2017-09-15 DIAGNOSIS — I10 Essential (primary) hypertension: Secondary | ICD-10-CM

## 2017-09-15 DIAGNOSIS — E1142 Type 2 diabetes mellitus with diabetic polyneuropathy: Secondary | ICD-10-CM | POA: Diagnosis not present

## 2017-09-15 DIAGNOSIS — I482 Chronic atrial fibrillation, unspecified: Secondary | ICD-10-CM

## 2017-09-15 DIAGNOSIS — Z0001 Encounter for general adult medical examination with abnormal findings: Secondary | ICD-10-CM

## 2017-09-15 DIAGNOSIS — Z0000 Encounter for general adult medical examination without abnormal findings: Secondary | ICD-10-CM

## 2017-09-15 DIAGNOSIS — I5031 Acute diastolic (congestive) heart failure: Secondary | ICD-10-CM | POA: Diagnosis not present

## 2017-09-15 NOTE — Progress Notes (Signed)
Patient: Hannah Faulkner, Female    DOB: 1941/03/16, 77 y.o.   MRN: [id number] Visit Date: 09/15/2017  Today's Provider: Wilhemena Durie, MD   Chief Complaint  Patient presents with  . Annual Exam   Subjective:   Patient saw [facility] for AWV on 07/15/2017.   Complete Physical Anner Baity Deason is a 77 y.o. female. She feels fairly well. She reports exercising some. She reports she is sleeping well. Overall health is fair. -----------------------------------------------------------   Review of Systems  HENT: Negative.   Eyes: Negative.   Respiratory: Negative.   Cardiovascular: Positive for palpitations.  Endocrine: Negative.   Musculoskeletal: Positive for arthralgias.  Allergic/Immunologic: Negative.   Neurological: Negative.   Psychiatric/Behavioral: Negative.   All other systems reviewed and are negative.   Social History   Socioeconomic History  . Marital status: Widowed    Spouse name: Not on file  . Number of children: 3  . Years of education: HS  . Highest education level: 12th grade  Occupational History    Employer: RETIRED  Social Needs  . Financial resource strain: Not hard at all  . Food insecurity:    Worry: Never true    Inability: Never true  . Transportation needs:    Medical: Yes    Non-medical: Yes  Tobacco Use  . Smoking status: Never Smoker  . Smokeless tobacco: Never Used  Substance and Sexual Activity  . Alcohol use: No  . Drug use: No  . Sexual activity: Not on file  Lifestyle  . Physical activity:    Days per week: 0 days    Minutes per session: 0 min  . Stress: Only a little  Relationships  . Social connections:    Talks on phone: More than three times a week    Gets together: More than three times a week    Attends religious service: More than 4 times per year    Active member of club or organization: No    Attends meetings of clubs or organizations: Never    Relationship status: Widowed  . Intimate partner  violence:    Fear of current or ex partner: Patient refused    Emotionally abused: Patient refused    Physically abused: Patient refused    Forced sexual activity: Patient refused  Other Topics Concern  . Not on file  Social History Narrative  . Not on file    Past Medical History:  Diagnosis Date  . A-fib ([place])   . CHF (congestive heart failure) ([place])   . Hypertension   . Sleep apnea      Patient Active Problem List   Diagnosis Date Noted  . Constipation 07/13/2017  . Hypokalemia 11/20/2016  . Lymphedema of both lower extremities 11/20/2016  . Chronic diastolic heart failure ([place]) 09/04/2016  . Restless legs 09/04/2016  . Fatigue 09/04/2016  . Hyperglycemia 08/31/2016  . Acute diastolic heart failure ([place]) 08/26/2016  . Community acquired pneumonia 08/04/2016  . Tricuspid regurgitation 05/15/2015  . Mitral valve regurgitation 05/15/2015  . Anxiety 08/25/2014  . Benign neoplasm of colon 08/25/2014  . 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 Diabetic neuropathy ([place]) 08/25/2014  . Essential hypertension 08/25/2014  . 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 Adiposity 08/25/2014  . Obstructive apnea 08/25/2014  . Hypercholesterolemia without hypertriglyceridemia 08/25/2014  . B12 deficiency 08/25/2014  . Apnea, sleep 07/15/2013  . AF (paroxysmal atrial fibrillation) ([place]) 07/15/2013    Past Surgical History:  Procedure Laterality Date  .  EYE SURGERY    . POLYPECTOMY     colon poly premoved    Her family history includes Atrial fibrillation in her sister; CVA in her mother and sister; Dementia in her sister; Diabetes in her sister; Heart attack in her mother and sister; Heart disease in her mother; Hypertension in her mother; Liver cancer in her brother; Lung cancer in her father; Thyroid disease in her sister, sister, and sister.      Current Outpatient Medications:  .  albuterol (PROVENTIL HFA;VENTOLIN HFA) 108 (90 Base) MCG/ACT inhaler, Inhale 2 puffs into the lungs  every 6 (six) hours as needed for wheezing or shortness of breath., Disp: , Rfl:  .  ALPRAZolam (XANAX) 0.5 MG tablet, TAKE 1 TABLET BY MOUTH AT BEDTIME AS NEEDED, Disp: 30 tablet, Rfl: 2 .  cyanocobalamin 1000 MCG tablet, Take 1,000 mcg by mouth daily., Disp: , Rfl:  .  diltiazem (CARDIZEM) 120 MG tablet, Take 120 mg by mouth 2 times daily at 12 noon and 4 pm. , Disp: , Rfl:  .  flecainide (TAMBOCOR) 50 MG tablet, Take 50 mg by mouth 2 (two) times daily. , Disp: , Rfl:  .  gabapentin (NEURONTIN) 100 MG capsule, TAKE 1 CAPSULE (100 MG TOTAL) BY MOUTH 3 (THREE) TIMES DAILY., Disp: 270 capsule, Rfl: 2 .  glycerin adult 2 g suppository, Place 1 suppository rectally as needed for constipation., Disp: 12 suppository, Rfl: 0 .  magnesium oxide (MAG-OX) 400 MG tablet, Take 400 mg by mouth daily., Disp: , Rfl:  .  metoprolol tartrate (LOPRESSOR) 100 MG tablet, Take 1 tablet (100 mg total) by mouth 2 (two) times daily., Disp: 60 tablet, Rfl: 3 .  omeprazole (PRILOSEC) 20 MG capsule, Take 1 capsule (20 mg total) by mouth at bedtime., Disp: 30 capsule, Rfl: 11 .  polyethylene glycol powder (GLYCOLAX/MIRALAX) powder, Take 17 g by mouth 2 (two) times daily as needed., Disp: 3350 g, Rfl: 1 .  potassium chloride SA (K-DUR,KLOR-CON) 20 MEQ tablet, Take 2 tablets (40 mEq total) by mouth 2 (two) times daily. And an additional 57meq ever Wednesday with metolazone, Disp: 124 tablet, Rfl: 5 .  prochlorperazine (COMPAZINE) 10 MG tablet, Take 1 tablet (10 mg total) by mouth every 8 (eight) hours as needed for nausea or vomiting., Disp: 30 tablet, Rfl: 1 .  Pyridoxine HCl (VITAMIN B6) 200 MG TABS, Take 200 tablets by mouth 2 (two) times daily. , Disp: , Rfl:  .  senna-docusate (SENOKOT-S) 8.6-50 MG tablet, Take 1 tablet by mouth daily., Disp: 30 tablet, Rfl: 12 .  torsemide (DEMADEX) 20 MG tablet, Take 2 tablets (40 mg total) by mouth 2 (two) times daily., Disp: 120 tablet, Rfl: 5 .  warfarin (COUMADIN) 2 MG tablet, Take 2  mg by mouth every other day. Take 2mg  one day and take 2.5mg  next day, alternating., Disp: , Rfl:  .  metolazone (ZAROXOLYN) 2.5 MG tablet, Take 1 tablet (2.5 mg total) by mouth once a week., Disp: 4 tablet, Rfl: 6  Patient Care Team: Jerrol Banana., MD as PCP - General (Family Medicine) Alisa Graff, FNP as Nurse Practitioner (Family Medicine) Isaias Cowman, MD as Consulting Physician (Cardiology) Flora Lipps, MD as Consulting Physician (Pulmonary Disease) Anell Barr, OD as Consulting Physician (Optometry)     Objective:   Vitals: BP 104/64 (BP Location: Right Arm, Patient Position: Sitting, Cuff Size: Large)   Pulse (!) 108   Temp 98.1 F (36.7 C) (Oral)   Resp 16  Ht 5\' 1"  (1.549 m)   Wt 152 lb (68.9 kg)   SpO2 96%   BMI 28.72 kg/m   Physical Exam  Constitutional: She is oriented to person, place, and time. She appears well-developed and well-nourished.  HENT:  Head: Normocephalic and atraumatic.  Right Ear: External ear normal.  Left Ear: External ear normal.  Nose: Nose normal.  Eyes: Conjunctivae are normal. No scleral icterus.  Cardiovascular: Normal rate, regular rhythm and normal heart sounds.  Pulmonary/Chest: Effort normal and breath sounds normal.  Abdominal: Soft.  Musculoskeletal: She exhibits no edema.  Neurological: She is alert and oriented to person, place, and time.  Skin: Skin is warm and dry.  Psychiatric: She has a normal mood and affect. Her behavior is normal. Judgment and thought content normal.    Activities of Daily Living In your present state of health, do you have any difficulty performing the following activities: 08/10/2017 07/15/2017  Hearing? N N  Vision? N N  Difficulty concentrating or making decisions? N N  Walking or climbing stairs? N Y  Comment - Was told to avoid steps.  Dressing or bathing? N N  Doing errands, shopping? N Y  Comment - Has not drove in the last 6 months.   Preparing Food and eating ? -  N  Using the Toilet? - N  In the past six months, have you accidently leaked urine? - N  Do you have problems with loss of bowel control? - N  Managing your Medications? - N  Managing your Finances? - N  Housekeeping or managing your Housekeeping? - N  Some recent data might be hidden    Fall Risk Assessment Fall Risk  08/10/2017 07/15/2017 07/13/2017 05/15/2017 02/11/2017  Falls in the past year? No No No No No     Depression Screen PHQ 2/9 Scores 08/10/2017 07/15/2017 07/13/2017 05/15/2017  PHQ - 2 Score 0 2 0 0  PHQ- 9 Score - 5 - -      Assessment & Plan:    Annual Physical Reviewed patient's Family Medical History Reviewed and updated list of patient's medical providers Assessment of cognitive impairment was done Assessed patient's functional ability Established a written schedule for health screening [place] Completed and Reviewed  Exercise Activities and Dietary recommendations Goals    . DIET - INCREASE WATER INTAKE     Recommend to continue drinking 6-8 glasses of water a day.        Immunization History  Administered Date(s) Administered  . Influenza Split 01/17/2006, 01/21/2012  . Influenza, High Dose Seasonal PF 02/08/2014, 01/17/2015, 01/10/2016, 12/11/2016  . Influenza,inj,Quad PF,6+ Mos 01/10/2013  . Pneumococcal Conjugate-13 02/08/2014  . Pneumococcal Polysaccharide-23 01/30/2002, 01/21/2012  . Td 10/17/2009  . Tdap 01/21/2012  . Zoster 02/21/2013    Health Maintenance  Topic Date Due  . FOOT EXAM  08/09/1950  . OPHTHALMOLOGY EXAM  08/09/1950  . URINE MICROALBUMIN  11/11/2016  . HEMOGLOBIN A1C  10/19/2017  . INFLUENZA VACCINE  10/22/2017  . TETANUS/TDAP  01/20/2022  . DEXA SCAN  Completed  . PNA vac Low Risk Adult  Completed    Pt declines any screening health maintenance issues. Discussed health benefits of physical activity, and encouraged her to engage in regular exercise appropriate for her age and condition.   TIIDM HTN CHF Afib Check labs--needs PT/INR 1 month.RTC 4 months.   --------------------------------------------------------------------------  I have done the exam and reviewed the above chart and it is accurate to the best of my  knowledge. Development worker, community has been used in this note in any air is in the dictation or transcription are unintentional.   Wilhemena Durie, MD  [place]

## 2017-09-15 NOTE — Patient Instructions (Addendum)
Labs ordered TSH CBC HgbA1c PT/INR [facility]C  Follow-up office visit in 4 months.

## 2017-09-16 LAB — COMPREHENSIVE METABOLIC PANEL
ALT: 13 IU/L (ref 0–32)
AST: 28 IU/L (ref 0–40)
Albumin/Globulin Ratio: 1.4 (ref 1.2–2.2)
Albumin: 4 g/dL (ref 3.5–4.8)
Alkaline Phosphatase: 109 IU/L (ref 39–117)
BUN/Creatinine Ratio: 15 (ref 12–28)
BUN: 10 mg/dL (ref 8–27)
Bilirubin Total: 0.3 mg/dL (ref 0.0–1.2)
CO2: 31 mmol/L — AB (ref 20–29)
CREATININE: 0.65 mg/dL (ref 0.57–1.00)
Calcium: 8.9 mg/dL (ref 8.7–10.3)
Chloride: 94 mmol/L — ABNORMAL LOW (ref 96–106)
GFR calc Af Amer: 99 mL/min/{1.73_m2} (ref >59)
GFR calc non Af Amer: 86 mL/min/{1.73_m2} (ref >59)
GLOBULIN, TOTAL: 2.8 g/dL (ref 1.5–4.5)
Glucose: 87 mg/dL (ref 65–99)
Potassium: 3.4 mmol/L — ABNORMAL LOW (ref 3.5–5.2)
SODIUM: 139 mmol/L (ref 134–144)
Total Protein: 6.8 g/dL (ref 6.0–8.5)

## 2017-09-16 LAB — CBC WITH DIFFERENTIAL/PLATELET
BASOS ABS: 0 10*3/uL (ref 0.0–0.2)
Basos: 0 %
EOS (ABSOLUTE): 0.1 10*3/uL (ref 0.0–0.4)
Eos: 1 %
Hematocrit: 33.5 % — ABNORMAL LOW (ref 34.0–46.6)
Hemoglobin: 11.1 g/dL (ref 11.1–15.9)
IMMATURE GRANULOCYTES: 0 %
Immature Grans (Abs): 0 10*3/uL (ref 0.0–0.1)
LYMPHS: 20 %
Lymphocytes Absolute: 1.5 10*3/uL (ref 0.7–3.1)
MCH: 27.9 pg (ref 26.6–33.0)
MCHC: 33.1 g/dL (ref 31.5–35.7)
MCV: 84 fL (ref 79–97)
MONOS ABS: 0.6 10*3/uL (ref 0.1–0.9)
Monocytes: 7 %
NEUTROS PCT: 72 %
Neutrophils Absolute: 5.6 10*3/uL (ref 1.4–7.0)
PLATELETS: 221 10*3/uL (ref 150–450)
RBC: 3.98 x10E6/uL (ref 3.77–5.28)
RDW: 14.6 % (ref 12.3–15.4)
WBC: 7.8 10*3/uL (ref 3.4–10.8)

## 2017-09-16 LAB — PROTIME-INR
INR: 2.2 — AB (ref 0.8–1.2)
PROTHROMBIN TIME: 21.5 s — AB (ref 9.1–12.0)

## 2017-09-16 LAB — TSH: TSH: 1.98 u[IU]/mL (ref 0.450–4.500)

## 2017-09-16 LAB — HEMOGLOBIN A1C
Est. average glucose Bld gHb Est-mCnc: 131 mg/dL
Hgb A1c MFr Bld: 6.2 % — ABNORMAL HIGH (ref 4.8–5.6)

## 2017-09-17 ENCOUNTER — Encounter: Payer: Self-pay | Admitting: Internal Medicine

## 2017-10-14 DIAGNOSIS — I5032 Chronic diastolic (congestive) heart failure: Secondary | ICD-10-CM | POA: Diagnosis not present

## 2017-10-16 ENCOUNTER — Ambulatory Visit: Payer: Self-pay

## 2017-10-19 ENCOUNTER — Other Ambulatory Visit: Payer: Self-pay | Admitting: Family

## 2017-10-19 MED ORDER — POTASSIUM CHLORIDE CRYS ER 20 MEQ PO TBCR: 40.0000 meq | EXTENDED_RELEASE_TABLET | Freq: Two times a day (BID) | ORAL | 5 refills | Status: AC

## 2017-10-22 DIAGNOSIS — I480 Paroxysmal atrial fibrillation: Secondary | ICD-10-CM | POA: Diagnosis not present

## 2017-10-22 DIAGNOSIS — Z79899 Other long term (current) drug therapy: Secondary | ICD-10-CM | POA: Diagnosis not present

## 2017-11-09 ENCOUNTER — Other Ambulatory Visit: Payer: Self-pay | Admitting: Family Medicine

## 2017-11-09 ENCOUNTER — Ambulatory Visit: Payer: Medicare Other | Attending: Family | Admitting: Family

## 2017-11-09 ENCOUNTER — Encounter: Payer: Self-pay | Admitting: Family

## 2017-11-09 VITALS — BP 133/97 | HR 117 | Resp 18 | Ht 61.0 in | Wt 154.1 lb

## 2017-11-09 DIAGNOSIS — I4891 Unspecified atrial fibrillation: Secondary | ICD-10-CM | POA: Diagnosis not present

## 2017-11-09 DIAGNOSIS — I8393 Asymptomatic varicose veins of bilateral lower extremities: Secondary | ICD-10-CM | POA: Diagnosis not present

## 2017-11-09 DIAGNOSIS — Z7901 Long term (current) use of anticoagulants: Secondary | ICD-10-CM | POA: Diagnosis not present

## 2017-11-09 DIAGNOSIS — G4730 Sleep apnea, unspecified: Secondary | ICD-10-CM | POA: Insufficient documentation

## 2017-11-09 DIAGNOSIS — Z880 Allergy status to penicillin: Secondary | ICD-10-CM | POA: Diagnosis not present

## 2017-11-09 DIAGNOSIS — I110 Hypertensive heart disease with heart failure: Secondary | ICD-10-CM | POA: Diagnosis not present

## 2017-11-09 DIAGNOSIS — Z79899 Other long term (current) drug therapy: Secondary | ICD-10-CM | POA: Insufficient documentation

## 2017-11-09 DIAGNOSIS — I480 Paroxysmal atrial fibrillation: Secondary | ICD-10-CM

## 2017-11-09 DIAGNOSIS — I890 Lymphedema, not elsewhere classified: Secondary | ICD-10-CM | POA: Diagnosis not present

## 2017-11-09 DIAGNOSIS — I5032 Chronic diastolic (congestive) heart failure: Secondary | ICD-10-CM | POA: Diagnosis not present

## 2017-11-09 DIAGNOSIS — I10 Essential (primary) hypertension: Secondary | ICD-10-CM

## 2017-11-09 DIAGNOSIS — I83893 Varicose veins of bilateral lower extremities with other complications: Secondary | ICD-10-CM

## 2017-11-09 DIAGNOSIS — I83813 Varicose veins of bilateral lower extremities with pain: Secondary | ICD-10-CM | POA: Insufficient documentation

## 2017-11-09 MED ORDER — PROCHLORPERAZINE MALEATE 10 MG PO TABS: 10.0000 mg | ORAL_TABLET | Freq: Three times a day (TID) | ORAL | 1 refills | Status: AC | PRN

## 2017-11-09 NOTE — Patient Instructions (Signed)
Continue weighing daily and call for an overnight weight gain of > 2 pounds or a weekly weight gain of >5 pounds. 

## 2017-11-09 NOTE — Progress Notes (Signed)
Patient ID: Hannah Faulkner, female    DOB: June 14, 1940, 77 y.o.   MRN: [id number]  HPI  Hannah Faulkner is a 77 yo female with a PMH of atrial fibrillation, HTN, restless leg syndrome, and CHF.   Last echo on 08/07/16 which showed an EF of 60-65%.  Admitted 06/27/17 due to atrial fibrillation with RVR along with supratherapeutic INR level. Was given vitamin K. Initially needed cardizem drip and then transitioned to oral medications. Cardiology consult obtained and she was discharged after 2 days. Was in the ED 06/18/17 due to fecal impaction where she was treated and released. Admitted 05/21/17 due to atrial fibrillation with RVR along with pneumonia. Was given antibiotics. Cardiology consult obtained. Discharged after 2 days.      She presents for a follow-up visit with a chief complaint of minimal fatigue upon moderate exertion. She describes this as chronic having been present for several years. She has associated pedal edema and gradual weight gain along with this. She denies any difficulty sleeping, abdominal distention, palpitations, chest pain, shortness of breath, cough or dizziness. Took a dose of metolazone along with potassium a week ago. Does get nausea when she takes the metolazone.   Past Medical History:  Diagnosis Date  . A-fib ([place])   . CHF (congestive heart failure) ([place])   . Hypertension   . Sleep apnea    Past Surgical History:  Procedure Laterality Date  . EYE SURGERY    . POLYPECTOMY     colon poly premoved   Family History  Problem Relation Age of Onset  . Hypertension Mother   . Heart disease Mother   . CVA Mother   . Heart attack Mother   . Lung cancer Father   . Heart attack Sister   . CVA Sister   . Thyroid disease Sister   . Liver cancer Brother   . Thyroid disease Sister   . Thyroid disease Sister   . Diabetes Sister   . Dementia Sister   . Atrial fibrillation Sister    Social History   Tobacco Use  . Smoking status: Never Smoker  . Smokeless tobacco:  Never Used  Substance Use Topics  . Alcohol use: No   Allergies  Allergen Reactions  . Ampicillin   . Prinzide  [Lisinopril-Hydrochlorothiazide]   . Penicillins Rash    .Has patient had a PCN reaction causing immediate rash, facial/tongue/throat swelling, SOB or lightheadedness with hypotension: Unknown Has patient had a PCN reaction causing severe rash involving mucus membranes or skin necrosis: No Has patient had a PCN reaction that required hospitalization: No Has patient had a PCN reaction occurring within the last 10 years: No If all of the above answers are "NO", then may proceed with Cephalosporin use.    Prior to Admission medications   Medication Sig Start Date End Date Taking? Authorizing Provider  albuterol (PROVENTIL HFA;VENTOLIN HFA) 108 (90 Base) MCG/ACT inhaler Inhale 2 puffs into the lungs every 6 (six) hours as needed for wheezing or shortness of breath.   Yes [provider]  ALPRAZolam Duanne Moron) 0.5 MG tablet TAKE 1 TABLET BY MOUTH AT BEDTIME AS NEEDED 08/06/17  Yes Jerrol Banana., MD  cyanocobalamin 1000 MCG tablet Take 1,000 mcg by mouth daily.   Yes [provider]  diltiazem (CARDIZEM) 120 MG tablet Take 120 mg by mouth 2 times daily at 12 noon and 4 pm.    Yes [provider]  flecainide (TAMBOCOR) 50 MG tablet Take 50 mg  by mouth 2 (two) times daily.  11/16/13  Yes [provider]  gabapentin (NEURONTIN) 100 MG capsule TAKE 1 CAPSULE (100 MG TOTAL) BY MOUTH 3 (THREE) TIMES DAILY. 12/21/16  Yes Jerrol Banana., MD  glycerin adult 2 g suppository Place 1 suppository rectally as needed for constipation. 06/18/17  Yes Merlyn Lot, MD  magnesium oxide (MAG-OX) 400 MG tablet Take 400 mg by mouth daily.   Yes [provider]  metoprolol tartrate (LOPRESSOR) 100 MG tablet Take 1 tablet (100 mg total) by mouth 2 (two) times daily. 08/31/16  Yes Theodoro Grist, MD  omeprazole (PRILOSEC) 20 MG capsule Take 1 capsule  (20 mg total) by mouth at bedtime. 04/15/17  Yes Jerrol Banana., MD  polyethylene glycol powder [facility]) powder Take 17 g by mouth 2 (two) times daily as needed. 06/16/17  Yes Jerrol Banana., MD  potassium chloride SA (K-DUR,KLOR-CON) 20 MEQ tablet Take 2 tablets (40 mEq total) by mouth 2 (two) times daily. And an additional 49meq ever Wednesday with metolazone 10/19/17  Yes Darylene Price A, FNP  Pyridoxine HCl (VITAMIN B6) 200 MG TABS Take 200 tablets by mouth 2 (two) times daily.  11/16/13  Yes [provider]  senna-docusate (SENOKOT-S) 8.6-50 MG tablet Take 1 tablet by mouth daily. 06/16/17  Yes Jerrol Banana., MD  torsemide (DEMADEX) 20 MG tablet Take 2 tablets (40 mg total) by mouth 2 (two) times daily. 05/15/17  Yes Darylene Price A, FNP  warfarin (COUMADIN) 2 MG tablet Take 2 mg by mouth every other day. Take 2mg  one day and take 2.5mg  next day, alternating.   Yes [provider]  metolazone (ZAROXOLYN) 2.5 MG tablet Take 1 tablet (2.5 mg total) by mouth once a week. 05/15/17 06/27/17  Alisa Graff, FNP  prochlorperazine (COMPAZINE) 10 MG tablet Take 1 tablet (10 mg total) by mouth every 8 (eight) hours as needed for nausea or vomiting. 11/09/17   Alisa Graff, FNP    Review of Systems  Constitutional: Positive for fatigue. Negative for appetite change.  HENT: Negative for congestion, postnasal drip and sore throat.   Eyes: Negative.   Respiratory: Negative for cough, chest tightness and shortness of breath.   Cardiovascular: Positive for leg swelling (right > left ). Negative for chest pain and palpitations.  Gastrointestinal: Negative for abdominal distention and abdominal pain.  Endocrine: Negative.   Genitourinary: Negative.   Musculoskeletal: Negative for arthralgias, back pain and neck pain.  Skin: Negative.   Allergic/Immunologic: Negative.   Neurological: Negative for dizziness and light-headedness.  Hematological: Negative for  adenopathy. Does not bruise/bleed easily.  Psychiatric/Behavioral: Negative for dysphoric mood and sleep disturbance (wearing oxygen on 2L). The patient is not nervous/anxious.    Vitals:   11/09/17 1612  BP: (!) 133/97  Pulse: (!) 117  Resp: 18  SpO2: 94%  Weight: 154 lb 2 oz (69.9 kg)  Height: 5\' 1"  (1.549 m)   Wt Readings from Last 3 Encounters:  11/09/17 154 lb 2 oz (69.9 kg)  09/15/17 152 lb (68.9 kg)  08/10/17 146 lb 8 oz (66.5 kg)   Lab Results  Component Value Date   CREATININE 0.65 09/15/2017   CREATININE 0.78 06/27/2017   CREATININE 0.84 06/18/2017    Physical Exam  Constitutional: She is oriented to person, place, and time. She appears well-developed and well-nourished.  HENT:  Head: Normocephalic and atraumatic.  Neck: Normal range of motion. Neck supple. No JVD present.  Cardiovascular: Normal  rate. An irregular rhythm present.  Pulmonary/Chest: Effort normal. She has no wheezes. She has no rales.  Abdominal: Soft. She exhibits no distension. There is no tenderness.  Musculoskeletal: She exhibits edema (1+ pitting edema in right lower leg). She exhibits no tenderness.  Neurological: She is alert and oriented to person, place, and time.  Skin: Skin is warm and dry.  Multiple spider veins noted in bilateral lower legs/feet with more on the right  Psychiatric: She has a normal mood and affect. Her behavior is normal. Thought content normal.  Nursing note and vitals reviewed.  Assessment & Plan 1: Chronic heart failure with preserved ejection fraction- - NYHA class II - euvolemic today - weighing daily and writing weight down. Continue to weigh daily and to call for an overnight weight gain > 2 pounds or a weekly weight gain > 5 pounds;  - weight up 8 pounds since she was last here 3 months ago - took a dose of metolazone/potassium a week ago. Does get nausea after metolazone and will take compazine - using salt when cooking foods. Reminded to closely follow a  2000 mg sodium diet.  - saw cardiology ([facility]) 06/01/17 - wearing oxygen at 2L mostly around the clock including at bedtime  2: HTN- - BP mildly elevated today - saw PCP Rosanna Randy) 09/15/17 - BMP from 09/15/17 reviewed an shows sodium 139, potassium 3.5 and GFR 86   3: Atrial fibrillation- - continues on metoprolol tartrate, diltiazem, and flecainide - continues with warfarin 2mg  QOD and 2.5mg  QOD - INR 2.2 on 09/15/17 - saw cardiology Marcello Moores @ [facility]) 07/17/17  4: Lymphedema- - stage 2 lymphedema - trying to elevate her legs more often - has worn compression socks in the past but inconsistently because she says that she has difficulty in putting them on - mom lost her leg due to poor circulation - wearing her compression boots daily for about 45 minutes at a time now  5: Varicose veins- - multiple tiny varicose veins in bilateral lower extremities including her feet - vein/vascular referral made  Patient did not bring her medications nor a list. Each medication was verbally reviewed with the patient and she was encouraged to bring the bottles to every visit to confirm accuracy of list.  Return in in 3 months or sooner for any questions/problems before then.

## 2017-11-14 DIAGNOSIS — I5032 Chronic diastolic (congestive) heart failure: Secondary | ICD-10-CM | POA: Diagnosis not present

## 2017-11-19 ENCOUNTER — Encounter: Payer: Self-pay | Admitting: Internal Medicine

## 2017-11-19 ENCOUNTER — Ambulatory Visit: Payer: Medicare Other | Admitting: Internal Medicine

## 2017-11-19 VITALS — BP 108/68 | HR 109 | Resp 16 | Ht 61.0 in | Wt 151.0 lb

## 2017-11-19 DIAGNOSIS — G4733 Obstructive sleep apnea (adult) (pediatric): Secondary | ICD-10-CM

## 2017-11-19 DIAGNOSIS — J449 Chronic obstructive pulmonary disease, unspecified: Secondary | ICD-10-CM | POA: Diagnosis not present

## 2017-11-19 NOTE — Progress Notes (Signed)
Name: Hannah Faulkner MRN: [id number] DOB: Oct 16, 1940     REFERRING MD : Reva Bores  CHIEF COMPLAINT: SOB  77 year old female with known hx of CHF transferred from the floor due to shortness of breath related to worsening Pulmonary edema/CHF Exacerbation requiring BiPAP.  SIGNIFICANT EVENTS  08/26/16 >> patient admitted with acute diastolic CHF exacerbation 08/26/16>> Patient transferred from the floor to ICU related to  Worsening pulmonary edema/ afib with RVR. 6/10 Discharged home  STUDIES:  08/07/16 ECHO>>Left ventricle: The cavity size was normal. Systolic function wasnormal. The estimated ejection fraction was in the range of 60%to 65%.  Sleep Study 3.2019 Moderate Sleep apnea AHI 14 118 desats   HISTORY OF PRESENT ILLNESS:   Hannah Faulkner is a 77 year old female with known history of diastolic heart failure and Atrial fibrillation.  Patient has signs and symptoms of OSA Patient dx now has dx of moderate OSA She has been instructed to start autoCPAP as soon as possible   She has h/o afib Multiple admissions to hosp for heart failure On oxygen since last 5 months Patient showing signs of heart failure with lower extremity edema She needs to follow up with cardiology  Patient has had progressive resp status decline over last 6 months Prognosis is guarded   +inspratory wheezes at this time  No signs of infection at this time   Allergies  Allergen Reactions  . Ampicillin   . Prinzide  [Lisinopril-Hydrochlorothiazide]   . Penicillins Rash    .Has patient had a PCN reaction causing immediate rash, facial/tongue/throat swelling, SOB or lightheadedness with hypotension: Unknown Has patient had a PCN reaction causing severe rash involving mucus membranes or skin necrosis: No Has patient had a PCN reaction that required hospitalization: No Has patient had a PCN reaction occurring within the last 10 years: No If all of the above answers are "NO", then may proceed with  Cephalosporin use.     REVIEW OF SYSTEMS:   Constitutional: Negative for fever, chills, weight loss, +malaise/fatigue  HENT: Negative for hearing loss, ear pain, nosebleeds, congestion, sore throat, neck pain, tinnitus and ear discharge.   Eyes: Negative for blurred vision, double vision, photophobia, pain, discharge and redness.  Respiratory: Negative for cough, hemoptysis, sputum production, +shortness of breath, -wheezing and -stridor.   Cardiovascular: Negative for chest pain, palpitations, orthopnea, claudication, leg swelling and PND.  Gastrointestinal: Negative for heartburn, nausea, vomiting, abdominal pain, diarrhea, constipation, blood in stool and melena.  Genitourinary: Negative for dysuria, urgency, frequency, hematuria and flank pain.  Musculoskeletal: Negative for myalgias, back pain, joint pain and falls. +edema Neurological: Negative for dizziness, tingling, tremors, sensory change, speech change, focal weakness, seizures, loss of consciousness, weakness and headaches.  Endo/Heme/Allergies: Negative for environmental allergies and polydipsia. Does not bruise/bleed easily.  ALL OTHER ROS ARE NEGATIVE  BP 108/68 (BP Location: Left Arm, Cuff Size: Normal)   Pulse (!) 109   Resp 16   Ht 5\' 1"  (1.549 m)   Wt 151 lb (68.5 kg)   SpO2 90%   BMI 28.53 kg/m   Physical Examination:   GENERAL:NAD, no fevers, chills, no weakness no fatigue HEAD: Normocephalic, atraumatic.  EYES: Pupils equal, round, reactive to light. Extraocular muscles intact. No scleral icterus.  MOUTH: Moist mucosal membrane.   EAR, NOSE, THROAT: Clear without exudates. No external lesions.  NECK: Supple. No thyromegaly. No nodules. No JVD.  PULMONARY:CTA B/L no wheezes, no crackles, no rhonchi CARDIOVASCULAR: S1 and S2. Regular rate and rhythm. No murmurs,  rubs, or gallops. + edema.  GASTROINTESTINAL: Soft, nontender, nondistended. No masses. Positive bowel sounds.  MUSCULOSKELETAL:+edema. Range of  motion full in all extremities.  NEUROLOGIC: Cranial nerves II through XII are intact. No gross focal neurological deficits.  SKIN: No ulceration, lesions, rashes, or cyanosis. Skin warm and dry. Turgor intact.  PSYCHIATRIC: Mood, affect within normal limits. The patient is awake, alert and oriented x 3. Insight, judgment intact.      ASSESSMENT / PLAN: 77 yo female with severe diastolic heart failure with chronic afib in setting of morbid obesity with  DX of OSA with deconditioned states with chronic hypoxic resp failure  1.SOB-multifactoriall, heart failure, obesity, deconditioned state Overall poor prognosis  2.Diastolic heart failure -follow up Cardiology as scheduled Patient watching her weight along with her salt intake Patient has been switched to different diuretics  3. Dx of OSA -sleep study confirms OSA AHI 14 -patient will need aggressive therapy soon in order to help prevent further admission to the hospital Start AUTOCPAP therapy with nasal mask/pillows as requested by patient  4.chornic hypoxic resp failure -needs oxygen as prescribed Continue 2 l  with exertion as prescribed She is requesting nasal mask and nasal pillows  5.afib-follow up cardiology  6.Obesity -recommend significant weight loss -recommend changing diet  7.Deconditioned state -Recommend increased daily activity and exercise  8.Inspiratory wheezes Use albuterol therapy as needed    Patient/Family are satisfied with Plan of action and management. All questions answered Follow up in 3 months, patient overall has a very deconditioned and decompensated heart failure but is not in any acute distress at this time patient with chronic respiratory failure with hypoxia I advised patient to start CPAP therapy ASAP  Corrin Parker, M.D.  [facility]  Medical Director [place] Director [facility] Cardio-Pulmonary Department

## 2017-11-19 NOTE — Patient Instructions (Addendum)
Start AUTO CPAP THERAPY 5-20 cm h20  Use albuterol 2-4 puffs every 4 hrs as needed for cough  Assess for portable oxygen concentrator  Follow up with Cardiology

## 2017-11-24 DIAGNOSIS — I480 Paroxysmal atrial fibrillation: Secondary | ICD-10-CM | POA: Diagnosis not present

## 2017-11-24 DIAGNOSIS — Z79899 Other long term (current) drug therapy: Secondary | ICD-10-CM | POA: Diagnosis not present

## 2017-11-26 ENCOUNTER — Encounter (INDEPENDENT_AMBULATORY_CARE_PROVIDER_SITE_OTHER): Payer: Self-pay | Admitting: Vascular Surgery

## 2017-11-26 ENCOUNTER — Telehealth: Payer: Self-pay | Admitting: Internal Medicine

## 2017-11-26 NOTE — Telephone Encounter (Signed)
Hannah Faulkner with [facility] calling stating patient sleep study has it expired per Patient insurance  Would need a new one done   Please advise

## 2017-11-27 NOTE — Telephone Encounter (Addendum)
Left message for patient that she will need another sleep study done. She missed the time frame allowed by her insurance (6) months therefore sleep study no longer valid. Asked if patient would call the office back.  Pt would like to consult with her insurance 1st. She will have her son call insurance and then will call office back with decision.

## 2017-12-07 ENCOUNTER — Ambulatory Visit (INDEPENDENT_AMBULATORY_CARE_PROVIDER_SITE_OTHER): Payer: Medicare Other | Admitting: Internal Medicine

## 2017-12-07 ENCOUNTER — Encounter: Payer: Self-pay | Admitting: Internal Medicine

## 2017-12-07 VITALS — BP 118/76 | HR 67 | Resp 16 | Ht 61.0 in | Wt 151.0 lb

## 2017-12-07 DIAGNOSIS — J449 Chronic obstructive pulmonary disease, unspecified: Secondary | ICD-10-CM

## 2017-12-07 DIAGNOSIS — G4733 Obstructive sleep apnea (adult) (pediatric): Secondary | ICD-10-CM

## 2017-12-07 NOTE — Progress Notes (Signed)
Name: Hannah Faulkner MRN: [id number] DOB: Apr 05, 1940       CHIEF COMPLAINT: SOB Patient is a 77 year old female with a history of congestive heart failure, excessive daytime sleepiness.  She previously had a sleep study in March which showed obstructive sleep apnea with an AHI of 14.  She does not appear to have been started on CPAP, and when the [facility] was put into [facility] she was informed that her CPAP is no longer covered due to a greater than 60-month timeframe before starting CPAP.  Therefore she was referred back here in order to get yet another sleep study to qualify for the same.    STUDIES:  08/07/16 ECHO>>Left ventricle: The cavity size was normal. Systolic function wasnormal. The estimated ejection fraction was in the range of 60%to 65%.  Sleep Study 3.2019 Moderate Sleep apnea AHI 14 118 desats   HISTORY OF PRESENT ILLNESS:   Hannah Faulkner is a 77 year old female with known history of diastolic heart failure and Atrial fibrillation.  Patient has signs and symptoms of OSA Patient dx now has dx of moderate OSA She has been instructed to start autoCPAP as soon as possible   She has h/o afib Multiple admissions to hosp for heart failure On oxygen since last 5 months Patient showing signs of heart failure with lower extremity edema She needs to follow up with cardiology  Patient has had progressive resp status decline over last 6 months Prognosis is guarded   +inspratory wheezes at this time  No signs of infection at this time   Allergies  Allergen Reactions  . Ampicillin   . Prinzide  [Lisinopril-Hydrochlorothiazide]   . Penicillins Rash    .Has patient had a PCN reaction causing immediate rash, facial/tongue/throat swelling, SOB or lightheadedness with hypotension: Unknown Has patient had a PCN reaction causing severe rash involving mucus membranes or skin necrosis: No Has patient had a PCN reaction that required hospitalization: No Has  patient had a PCN reaction occurring within the last 10 years: No If all of the above answers are "NO", then may proceed with Cephalosporin use.     Review of Systems:  Constitutional: Feels well. Cardiovascular: No chest pain.  Pulmonary: Denies dyspnea.   The remainder of systems were reviewed and were found to be negative other than what is documented in the HPI.     BP 118/76 (BP Location: Left Arm, Cuff Size: Normal)   Pulse 67   Resp 16   Ht 5\' 1"  (1.549 m)   Wt 151 lb (68.5 kg)   SpO2 91%   BMI 28.53 kg/m   Physical Examination:   VS: BP 118/76 (BP Location: Left Arm, Cuff Size: Normal)   Pulse 67   Resp 16   Ht 5\' 1"  (1.549 m)   Wt 151 lb (68.5 kg)   SpO2 91%   BMI 28.53 kg/m   General Appearance: No distress  Neuro:without focal findings, mental status, speech normal, alert and oriented HEENT: PERRLA, EOM intact Pulmonary: No wheezing, No rales  CardiovascularNormal S1,S2.  No m/r/g.  Abdomen: Benign, Soft, non-tender, No masses Renal:  No costovertebral tenderness  GU:  No performed at this time. Endoc: No evident thyromegaly, no signs of acromegaly or Cushing features Skin:   warm, no rashes, no ecchymosis  Extremities: normal, no cyanosis, clubbing.       ASSESSMENT / PLAN: 77 yo female with severe diastolic heart failure with chronic afib in setting of morbid obesity with  DX of OSA with deconditioned states with chronic hypoxic resp failure  1.SOB-multifactoriall, heart failure, obesity, deconditioned state Overall poor prognosis  2.Diastolic heart failure -follow up Cardiology as scheduled Patient watching her weight along with her salt intake Patient has been switched to different diuretics  3.  Obstructive sleep apnea with excessive daytime sleepiness. -Continued excessive daytime sleepiness, patient will need a new sleep study.  4.chronic hypoxic respiratory failure -needs oxygen as prescribed Continue 2 L oxygen with exertion as  prescribed. She is requesting nasal mask and nasal pillows  5.afib-follow up cardiology Vapne can contribute to atrial fibrillation, therefore treatment of sleep apnea is important part of atrial fibrillation management.  6.Obesity -recommend significant weight loss -recommend changing diet  7.Deconditioned state -Recommend increased daily activity and exercise  8.Inspiratory wheezes Use albuterol therapy as needed  Followup with Dr. Mortimer Fries in 3 months.    Marda Stalker, M.D., F.C.C.P.  Board Certified in Internal Medicine, Pulmonary Medicine, [place], and Sleep Medicine.  [facility] Office Number: [phone]

## 2017-12-07 NOTE — Patient Instructions (Signed)

## 2017-12-11 ENCOUNTER — Ambulatory Visit (INDEPENDENT_AMBULATORY_CARE_PROVIDER_SITE_OTHER): Payer: Medicare Other | Admitting: Vascular Surgery

## 2017-12-11 ENCOUNTER — Ambulatory Visit (INDEPENDENT_AMBULATORY_CARE_PROVIDER_SITE_OTHER): Payer: Medicare Other | Admitting: *Deleted

## 2017-12-11 ENCOUNTER — Encounter (INDEPENDENT_AMBULATORY_CARE_PROVIDER_SITE_OTHER): Payer: Self-pay | Admitting: Vascular Surgery

## 2017-12-11 VITALS — BP 116/66 | HR 106

## 2017-12-11 VITALS — BP 121/77 | HR 77 | Resp 18 | Ht 61.0 in | Wt 151.6 lb

## 2017-12-11 DIAGNOSIS — I10 Essential (primary) hypertension: Secondary | ICD-10-CM

## 2017-12-11 DIAGNOSIS — J9611 Chronic respiratory failure with hypoxia: Secondary | ICD-10-CM | POA: Diagnosis not present

## 2017-12-11 DIAGNOSIS — I83893 Varicose veins of bilateral lower extremities with other complications: Secondary | ICD-10-CM | POA: Diagnosis not present

## 2017-12-11 DIAGNOSIS — E0842 Diabetes mellitus due to underlying condition with diabetic polyneuropathy: Secondary | ICD-10-CM

## 2017-12-11 DIAGNOSIS — I5032 Chronic diastolic (congestive) heart failure: Secondary | ICD-10-CM | POA: Diagnosis not present

## 2017-12-11 NOTE — Progress Notes (Signed)
Patient completed POC walk on 2 liters pulse dose. Orders placed placed for POC.

## 2017-12-11 NOTE — Patient Instructions (Signed)
Edema Edema is when you have too much fluid in your body or under your skin. Edema may make your legs, feet, and ankles swell up. Swelling is also common in looser tissues, like around your eyes. This is a common condition. It gets more common as you get older. There are many possible causes of edema. Eating too much salt (sodium) and being on your feet or sitting for a long time can cause edema in your legs, feet, and ankles. Hot weather may make edema worse. Edema is usually painless. Your skin may look swollen or shiny. Follow these instructions at home:  Keep the swollen body part raised (elevated) above the level of your heart when you are sitting or lying down.  Do not sit still or stand for a long time.  Do not wear tight clothes. Do not wear garters on your upper legs.  Exercise your legs. This can help the swelling go down.  Wear elastic bandages or support stockings as told by your doctor.  Eat a low-salt (low-sodium) diet to reduce fluid as told by your doctor.  Depending on the cause of your swelling, you may need to limit how much fluid you drink (fluid restriction).  Take over-the-counter and prescription medicines only as told by your doctor. Contact a doctor if:  Treatment is not working.  You have heart, liver, or kidney disease and have symptoms of edema.  You have sudden and unexplained weight gain. Get help right away if:  You have shortness of breath or chest pain.  You cannot breathe when you lie down.  You have pain, redness, or warmth in the swollen areas.  You have heart, liver, or kidney disease and get edema all of a sudden.  You have a fever and your symptoms get worse all of a sudden. Summary  Edema is when you have too much fluid in your body or under your skin.  Edema may make your legs, feet, and ankles swell up. Swelling is also common in looser tissues, like around your eyes.  Raise (elevate) the swollen body part above the level of your  heart when you are sitting or lying down.  Follow your doctor's instructions about diet and how much fluid you can drink (fluid restriction). This information is not intended to replace advice given to you by your health care provider. Make sure you discuss any questions you have with your health care provider. Document Released: 08/27/2007 Document Revised: 03/28/2016 Document Reviewed: 03/28/2016 Elsevier Interactive Patient Education  2017 [employer]  

## 2017-12-11 NOTE — Assessment & Plan Note (Signed)
blood glucose control important in reducing the progression of atherosclerotic disease. Also, involved in wound healing. On appropriate medications.  

## 2017-12-11 NOTE — Progress Notes (Signed)
Patient ID: Hannah Faulkner, female   DOB: 07/10/1940, 77 y.o.   MRN: [id number]  Chief Complaint  Patient presents with  . New Patient (Initial Visit)    ref Hannah Faulkner for varicose veins    HPI Hannah Faulkner is a 77 y.o. female.  I am asked to see the patient by Darylene Price for evaluation of varicose veins and leg swelling.  The patient presents with complaints of symptomatic varicosities of the lower extremities.  The patient had an extensive cardiac issue about a year ago with severe congestive heart failure and massive leg swelling bilaterally.  She has lost about 60 pounds most of which is fluid weight with diuresis and control of her heart.  With all of this, her leg swelling has come down significantly although her right leg remains largely swollen and her left leg has swelling as well.  She has had more discoloration and purplish hue to the color of her right foot and leg.  Her varicosities which were always somewhat prominent have become much more prominent.  She has significant neuropathy of her lower extremities that she really does not feel them well, but the legs are heavy and tired.  The patient complains of marked swelling as an associated symptom. The patient has no previous history of deep venous thrombosis or superficial thrombophlebitis to their knowledge.  Leg is the more severely affected of the 2 legs.  She already has a lymphedema pump and compression stockings which she uses regularly.  She elevates her legs.     Past Medical History:  Diagnosis Date  . A-fib ([place])   . CHF (congestive heart failure) ([place])   . Hypertension   . Sleep apnea     Past Surgical History:  Procedure Laterality Date  . EYE SURGERY    . POLYPECTOMY     colon poly premoved    Family History  Problem Relation Age of Onset  . Hypertension Mother   . Heart disease Mother   . CVA Mother   . Heart attack Mother   . Lung cancer Father   . Heart attack Sister   . CVA Sister   .  Thyroid disease Sister   . Liver cancer Brother   . Thyroid disease Sister   . Thyroid disease Sister   . Diabetes Sister   . Dementia Sister   . Atrial fibrillation Sister     Social History Social History   Tobacco Use  . Smoking status: Never Smoker  . Smokeless tobacco: Never Used  Substance Use Topics  . Alcohol use: No  . Drug use: No     Allergies  Allergen Reactions  . Ampicillin   . Prinzide  [Lisinopril-Hydrochlorothiazide]   . Penicillins Rash    .Has patient had a PCN reaction causing immediate rash, facial/tongue/throat swelling, SOB or lightheadedness with hypotension: Unknown Has patient had a PCN reaction causing severe rash involving mucus membranes or skin necrosis: No Has patient had a PCN reaction that required hospitalization: No Has patient had a PCN reaction occurring within the last 10 years: No If all of the above answers are "NO", then may proceed with Cephalosporin use.     Current Outpatient Medications  Medication Sig Dispense Refill  . albuterol (PROVENTIL HFA;VENTOLIN HFA) 108 (90 Base) MCG/ACT inhaler Inhale 2 puffs into the lungs every 6 (six) hours as needed for wheezing or shortness of breath.    . ALPRAZolam (XANAX) 0.5 MG tablet TAKE 1 TABLET BY MOUTH  AT BEDTIME AS NEEDED 30 tablet 3  . cyanocobalamin 1000 MCG tablet Take 1,000 mcg by mouth daily.    Marland Kitchen diltiazem (CARDIZEM) 120 MG tablet Take 120 mg by mouth 2 times daily at 12 noon and 4 pm.     . flecainide (TAMBOCOR) 50 MG tablet Take 50 mg by mouth 2 (two) times daily.     Marland Kitchen gabapentin (NEURONTIN) 100 MG capsule TAKE 1 CAPSULE (100 MG TOTAL) BY MOUTH 3 (THREE) TIMES DAILY. 270 capsule 2  . glycerin adult 2 g suppository Place 1 suppository rectally as needed for constipation. 12 suppository 0  . magnesium oxide (MAG-OX) 400 MG tablet Take 400 mg by mouth daily.    . metoprolol tartrate (LOPRESSOR) 100 MG tablet Take 1 tablet (100 mg total) by mouth 2 (two) times daily. 60 tablet 3    . omeprazole (PRILOSEC) 20 MG capsule Take 1 capsule (20 mg total) by mouth at bedtime. 30 capsule 11  . polyethylene glycol powder (GLYCOLAX/MIRALAX) powder Take 17 g by mouth 2 (two) times daily as needed. 3350 g 1  . potassium chloride SA (K-DUR,KLOR-CON) 20 MEQ tablet Take 2 tablets (40 mEq total) by mouth 2 (two) times daily. And an additional 6meq ever Wednesday with metolazone 124 tablet 5  . prochlorperazine (COMPAZINE) 10 MG tablet Take 1 tablet (10 mg total) by mouth every 8 (eight) hours as needed for nausea or vomiting. 30 tablet 1  . Pyridoxine HCl (VITAMIN B6) 200 MG TABS Take 200 tablets by mouth 2 (two) times daily.     Marland Kitchen senna-docusate (SENOKOT-S) 8.6-50 MG tablet Take 1 tablet by mouth daily. 30 tablet 12  . torsemide (DEMADEX) 20 MG tablet Take 2 tablets (40 mg total) by mouth 2 (two) times daily. 120 tablet 5  . warfarin (COUMADIN) 2 MG tablet Take 2 mg by mouth every other day. Take 2mg  one day and take 2.5mg  next day, alternating.    . metolazone (ZAROXOLYN) 2.5 MG tablet Take 1 tablet (2.5 mg total) by mouth once a week. (Patient taking differently: Take 2.5 mg by mouth as needed. ) 4 tablet 6   No current facility-administered medications for this visit.       REVIEW OF SYSTEMS (Negative unless checked)  Constitutional: [] Weight loss  [] Fever  [] Chills Cardiac: [] Chest pain   [] Chest pressure   [] Palpitations   [] Shortness of breath when laying flat   [x] Shortness of breath at rest   [x] Shortness of breath with exertion. Vascular:  [] Pain in legs with walking   [] Pain in legs at rest   [] Pain in legs when laying flat   [] Claudication   [] Pain in feet when walking  [] Pain in feet at rest  [] Pain in feet when laying flat   [] History of DVT   [] Phlebitis   [x] Swelling in legs   [x] Varicose veins   [] Non-healing ulcers Pulmonary:   [] Uses home oxygen   [] Productive cough   [] Hemoptysis   [] Wheeze  [x] COPD   [] Asthma Neurologic:  [] Dizziness  [] Blackouts   [] Seizures    [] History of stroke   [] History of TIA  [] Aphasia   [] Temporary blindness   [] Dysphagia   [] Weakness or numbness in arms   [] Weakness or numbness in legs Musculoskeletal:  [x] Arthritis   [] Joint swelling   [] Joint pain   [] Low back pain Hematologic:  [] Easy bruising  [] Easy bleeding   [] Hypercoagulable state   [] Anemic  [] Hepatitis Gastrointestinal:  [] Blood in stool   [] Vomiting blood  [] Gastroesophageal reflux/heartburn   []   Abdominal pain Genitourinary:  [] Chronic kidney disease   [] Difficult urination  [] Frequent urination  [] Burning with urination   [] Hematuria Skin:  [] Rashes   [] Ulcers   [] Wounds Psychological:  [] History of anxiety   []  History of major depression.    Physical Exam BP 121/77 (BP Location: Right Arm)   Pulse 77   Resp 18   Ht 5\' 1"  (1.549 m)   Wt 151 lb 9.6 oz (68.8 kg)   BMI 28.64 kg/m  Gen:  WD/WN, NAD Head: [place]/AT, No temporalis wasting.  Ear/Nose/Throat: Hearing grossly intact, dentition good Eyes: Sclera non-icteric. Conjunctiva clear Neck: Supple. Trachea midline Pulmonary:  Good air movement, no use of accessory muscles, respirations not labored on supplemental oxygen Cardiac: RRR, No JVD Vascular: Varicosities extensive and measuring up to 2 mm in the right lower extremity        Varicosities diffuse and measuring up to 1-2 mm in the left lower extremity Vessel Right Left  Radial Palpable Palpable                          PT  trace palpable  1+ palpable  DP  2+ palpable  2+ palpable   Gastrointestinal: soft, non-tender/non-distended.  Musculoskeletal: M/S 5/5 throughout.   2 + RLE edema.  1 + LLE edema Neurologic: Sensation grossly intact in extremities.  Symmetrical.  Speech is fluent.  Psychiatric: Judgment intact, Mood & affect appropriate for pt's clinical situation. Dermatologic: No rashes or ulcers noted.  No cellulitis or open wounds.    Radiology No results found.  Labs Recent Results (from the past 2160 hour(s))  CBC  w/Diff/Platelet     Status: Abnormal   Collection Time: 09/15/17  3:15 PM  Result Value Ref Range   WBC 7.8 3.4 - 10.8 x10E3/uL   RBC 3.98 3.77 - 5.28 x10E6/uL   Hemoglobin 11.1 11.1 - 15.9 g/dL   Hematocrit 33.5 (L) 34.0 - 46.6 %   MCV 84 79 - 97 fL   MCH 27.9 26.6 - 33.0 pg   MCHC 33.1 31.5 - 35.7 g/dL   RDW 14.6 12.3 - 15.4 %   Platelets 221 150 - 450 x10E3/uL   Neutrophils 72 Not Estab. %   Lymphs 20 Not Estab. %   Monocytes 7 Not Estab. %   Eos 1 Not Estab. %   Basos 0 Not Estab. %   Neutrophils Absolute 5.6 1.4 - 7.0 x10E3/uL   Lymphocytes Absolute 1.5 0.7 - 3.1 x10E3/uL   Monocytes Absolute 0.6 0.1 - 0.9 x10E3/uL   EOS (ABSOLUTE) 0.1 0.0 - 0.4 x10E3/uL   Basophils Absolute 0.0 0.0 - 0.2 x10E3/uL   Immature Granulocytes 0 Not Estab. %   Immature Grans (Abs) 0.0 0.0 - 0.1 x10E3/uL  TSH     Status: None   Collection Time: 09/15/17  3:15 PM  Result Value Ref Range   TSH 1.980 0.450 - 4.500 uIU/mL  Comprehensive metabolic panel     Status: Abnormal   Collection Time: 09/15/17  3:15 PM  Result Value Ref Range   Glucose 87 65 - 99 mg/dL   BUN 10 8 - 27 mg/dL   Creatinine, Ser 0.65 0.57 - 1.00 mg/dL   GFR calc non Af Amer 86 >59 mL/min/1.73   GFR calc Af Amer 99 >59 mL/min/1.73   BUN/Creatinine Ratio 15 12 - 28   Sodium 139 134 - 144 mmol/L   Potassium 3.4 (L) 3.5 - 5.2 mmol/L   Chloride  94 (L) 96 - 106 mmol/L   CO2 31 (H) 20 - 29 mmol/L   Calcium 8.9 8.7 - 10.3 mg/dL   Total Protein 6.8 6.0 - 8.5 g/dL   Albumin 4.0 3.5 - 4.8 g/dL   Globulin, Total 2.8 1.5 - 4.5 g/dL   Albumin/Globulin Ratio 1.4 1.2 - 2.2   Bilirubin Total 0.3 0.0 - 1.2 mg/dL   Alkaline Phosphatase 109 39 - 117 IU/L   AST 28 0 - 40 IU/L   ALT 13 0 - 32 IU/L  Protime-INR     Status: Abnormal   Collection Time: 09/15/17  3:15 PM  Result Value Ref Range   INR 2.2 (H) 0.8 - 1.2    Comment: Reference interval is for non-anticoagulated patients. Suggested INR therapeutic range for Vitamin K antagonist  therapy:    Standard Dose (moderate intensity                   therapeutic range):       2.0 - 3.0    Higher intensity therapeutic range       2.5 - 3.5    Prothrombin Time 21.5 (H) 9.1 - 12.0 sec  Hemoglobin A1C     Status: Abnormal   Collection Time: 09/15/17  3:15 PM  Result Value Ref Range   Hgb A1c MFr Bld 6.2 (H) 4.8 - 5.6 %    Comment:          Prediabetes: 5.7 - 6.4          Diabetes: >6.4          Glycemic control for adults with diabetes: <7.0    Est. average glucose Bld gHb Est-mCnc 131 mg/dL    Assessment/Plan:  Diabetic neuropathy blood glucose control important in reducing the progression of atherosclerotic disease. Also, involved in wound healing. On appropriate medications.   Essential hypertension blood pressure control important in reducing the progression of atherosclerotic disease. On appropriate oral medications.   Chronic diastolic heart failure ([place]) She reports that this was much worse last year.  She is on significant diuretic therapy and has lost about 60 pounds.  Still follows with her primary care physician and cardiologist.  Varicose veins of both legs with edema   The patient has symptoms consistent with chronic venous insufficiency. We discussed the natural history and treatment options for venous disease. I recommended the regular use of 20 - 30 mm Hg compression stockings, and prescribed these today. I recommended leg elevation and anti-inflammatories as needed for pain. I have also recommended a complete venous duplex to assess the venous system for reflux or thrombotic issues. This can be done at the patient's convenience. I will see the patient back after the duplex to assess the response to conservative management, and determine further treatment options.     Leotis Pain 12/11/2017, 3:55 PM   This note was created with Dragon medical transcription system.  Any errors from dictation are unintentional.

## 2017-12-11 NOTE — Assessment & Plan Note (Signed)
blood pressure control important in reducing the progression of atherosclerotic disease. On appropriate oral medications.  

## 2017-12-11 NOTE — Assessment & Plan Note (Signed)
She reports that this was much worse last year.  She is on significant diuretic therapy and has lost about 60 pounds.  Still follows with her primary care physician and cardiologist.

## 2017-12-15 DIAGNOSIS — I5032 Chronic diastolic (congestive) heart failure: Secondary | ICD-10-CM | POA: Diagnosis not present

## 2017-12-22 DIAGNOSIS — I480 Paroxysmal atrial fibrillation: Secondary | ICD-10-CM | POA: Diagnosis not present

## 2017-12-22 DIAGNOSIS — Z79899 Other long term (current) drug therapy: Secondary | ICD-10-CM | POA: Diagnosis not present

## 2017-12-28 ENCOUNTER — Other Ambulatory Visit: Payer: Self-pay | Admitting: Family Medicine

## 2018-01-01 DIAGNOSIS — G4733 Obstructive sleep apnea (adult) (pediatric): Secondary | ICD-10-CM | POA: Diagnosis not present

## 2018-01-02 ENCOUNTER — Encounter: Payer: Self-pay | Admitting: Internal Medicine

## 2018-01-02 DIAGNOSIS — G4733 Obstructive sleep apnea (adult) (pediatric): Secondary | ICD-10-CM

## 2018-01-02 DIAGNOSIS — J449 Chronic obstructive pulmonary disease, unspecified: Secondary | ICD-10-CM

## 2018-01-04 ENCOUNTER — Ambulatory Visit (INDEPENDENT_AMBULATORY_CARE_PROVIDER_SITE_OTHER): Payer: Medicare Other | Admitting: Vascular Surgery

## 2018-01-04 ENCOUNTER — Encounter (INDEPENDENT_AMBULATORY_CARE_PROVIDER_SITE_OTHER): Payer: Self-pay | Admitting: Vascular Surgery

## 2018-01-04 ENCOUNTER — Ambulatory Visit (INDEPENDENT_AMBULATORY_CARE_PROVIDER_SITE_OTHER): Payer: Medicare Other

## 2018-01-04 VITALS — BP 102/70 | HR 129 | Resp 18 | Ht 61.0 in | Wt 149.8 lb

## 2018-01-04 DIAGNOSIS — I890 Lymphedema, not elsewhere classified: Secondary | ICD-10-CM

## 2018-01-04 DIAGNOSIS — I872 Venous insufficiency (chronic) (peripheral): Secondary | ICD-10-CM | POA: Diagnosis not present

## 2018-01-04 DIAGNOSIS — I83893 Varicose veins of bilateral lower extremities with other complications: Secondary | ICD-10-CM

## 2018-01-04 DIAGNOSIS — I83813 Varicose veins of bilateral lower extremities with pain: Secondary | ICD-10-CM | POA: Diagnosis not present

## 2018-01-05 ENCOUNTER — Encounter (INDEPENDENT_AMBULATORY_CARE_PROVIDER_SITE_OTHER): Payer: Self-pay | Admitting: Vascular Surgery

## 2018-01-05 DIAGNOSIS — I890 Lymphedema, not elsewhere classified: Secondary | ICD-10-CM | POA: Insufficient documentation

## 2018-01-05 DIAGNOSIS — I480 Paroxysmal atrial fibrillation: Secondary | ICD-10-CM | POA: Diagnosis not present

## 2018-01-05 DIAGNOSIS — Z79899 Other long term (current) drug therapy: Secondary | ICD-10-CM | POA: Diagnosis not present

## 2018-01-05 DIAGNOSIS — I872 Venous insufficiency (chronic) (peripheral): Secondary | ICD-10-CM | POA: Insufficient documentation

## 2018-01-05 NOTE — Progress Notes (Signed)
Subjective:    Patient ID: Hannah Faulkner, female    DOB: Jan 19, 1941, 77 y.o.   MRN: [id number] Chief Complaint  Patient presents with  . Follow-up    pt conv bil ven reflux ultrasound   Patient presents to review vascular studies.  Patient was initially seen on December 11, 2017 and evaluation of bilateral lower extremity painful varicosities and edema to the bilateral legs.  The patient was seen with her son endorses a history of engaging in conservative therapy for "many years".  The patient has been wearing medical grade 1 compression socks, elevating her legs and using a lymphedema pump for at least an hour twice a day.  Conservative and lymphatic therapy have been unsuccessful in improving her symptoms.  The patient continues to experience lifestyle limiting pain along the numerous and prominent varicosities to the bilateral legs.  The patient also experiences minimal improvement to her edema which worsens towards the end of the day or with sitting and standing for long periods of time.  This edema is also become more symptomatic and lifestyle limiting.  The patient feels that her symptoms have pressed to the point that she is unable to function on a daily basis.  The patient denies any recent bouts of recurrent cellulitis or ulcer formation to the bilateral legs.  The patient underwent a bilateral lower extremity venous duplex which was notable for Right: Evidence of chronic venous insufficiency detected in the great saphenous vein (right distal thigh and knee level).  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  No evidence of deep vein thrombosis. Left: Evidence of chronic venous insufficiency is detected in the great saphenous vein (left proximal and distal thigh).  There is no evidence of deep vein or superficial venous thrombosis.  Patient denies any fever, nausea or vomiting.  Review of Systems  Constitutional: Negative.   HENT: Negative.   Eyes: Negative.     Respiratory: Negative.   Cardiovascular: Positive for leg swelling.       Painful varicose veins  Gastrointestinal: Negative.   Endocrine: Negative.   Genitourinary: Negative.   Musculoskeletal: Negative.   Skin: Negative.   Allergic/Immunologic: Negative.   Neurological: Negative.   Hematological: Negative.   Psychiatric/Behavioral: Negative.       Objective:   Physical Exam  Constitutional: She is oriented to person, place, and time. She appears well-developed and well-nourished. No distress.  HENT:  Head: Normocephalic and atraumatic.  Right Ear: External ear normal.  Left Ear: External ear normal.  Eyes: Pupils are equal, round, and reactive to light. Conjunctivae and EOM are normal.  Neck: Normal range of motion.  Cardiovascular: Normal rate, regular rhythm, normal heart sounds and intact distal pulses.  Pulses:      Radial pulses are 2+ on the right side, and 2+ on the left side.  Hard to palpate pedal pulses due to body habitus and edema however the bilateral feet are warm.  There is good capillary refill bilaterally.  Pulmonary/Chest: Effort normal and breath sounds normal.  Musculoskeletal: Normal range of motion. She exhibits edema (Right lower extremity: Moderate 1+ pitting edema noted.  Lower extremity: Also moderate 1+ pitting edema noted).  Neurological: She is alert and oriented to person, place, and time.  Skin: She is not diaphoretic.  Diffuse mixture of greater than 1 cm and less than 1 cm varicosities noted to the bilateral lower extremity.  Mild stasis dermatitis.  There is no fibrosis, cellulitis or active ulcerations at this  time.  Psychiatric: She has a normal mood and affect. Her behavior is normal. Judgment and thought content normal.  Vitals reviewed.  BP 102/70 (BP Location: Right Arm)   Pulse (!) 129   Resp 18   Ht 5\' 1"  (1.549 m)   Wt 149 lb 12.8 oz (67.9 kg)   BMI 28.30 kg/m   Past Medical History:  Diagnosis Date  . A-fib ([place])   . CHF  (congestive heart failure) ([place])   . Hypertension   . Sleep apnea    Social History   Socioeconomic History  . Marital status: Widowed    Spouse name: Not on file  . Number of children: 3  . Years of education: HS  . Highest education level: 12th grade  Occupational History    Employer: RETIRED  Social Needs  . Financial resource strain: Not hard at all  . Food insecurity:    Worry: Never true    Inability: Never true  . Transportation needs:    Medical: Yes    Non-medical: Yes  Tobacco Use  . Smoking status: Never Smoker  . Smokeless tobacco: Never Used  Substance and Sexual Activity  . Alcohol use: No  . Drug use: No  . Sexual activity: Not on file  Lifestyle  . Physical activity:    Days per week: 0 days    Minutes per session: 0 min  . Stress: Only a little  Relationships  . Social connections:    Talks on phone: More than three times a week    Gets together: More than three times a week    Attends religious service: More than 4 times per year    Active member of club or organization: No    Attends meetings of clubs or organizations: Never    Relationship status: Widowed  . Intimate partner violence:    Fear of current or ex partner: Patient refused    Emotionally abused: Patient refused    Physically abused: Patient refused    Forced sexual activity: Patient refused  Other Topics Concern  . Not on file  Social History Narrative  . Not on file   Past Surgical History:  Procedure Laterality Date  . EYE SURGERY    . POLYPECTOMY     colon poly premoved   Family History  Problem Relation Age of Onset  . Hypertension Mother   . Heart disease Mother   . CVA Mother   . Heart attack Mother   . Lung cancer Father   . Heart attack Sister   . CVA Sister   . Thyroid disease Sister   . Liver cancer Brother   . Thyroid disease Sister   . Thyroid disease Sister   . Diabetes Sister   . Dementia Sister   . Atrial fibrillation Sister    Allergies    Allergen Reactions  . Ampicillin   . Prinzide  [Lisinopril-Hydrochlorothiazide]   . Penicillins Rash    .Has patient had a PCN reaction causing immediate rash, facial/tongue/throat swelling, SOB or lightheadedness with hypotension: Unknown Has patient had a PCN reaction causing severe rash involving mucus membranes or skin necrosis: No Has patient had a PCN reaction that required hospitalization: No Has patient had a PCN reaction occurring within the last 10 years: No If all of the above answers are "NO", then may proceed with Cephalosporin use.       Assessment & Plan:  Patient presents to review vascular studies.  Patient was initially seen on December 11, 2017 and evaluation of bilateral lower extremity painful varicosities and edema to the bilateral legs.  The patient was seen with her son endorses a history of engaging in conservative therapy for "many years".  The patient has been wearing medical grade 1 compression socks, elevating her legs and using a lymphedema pump for at least an hour twice a day.  Conservative and lymphatic therapy have been unsuccessful in improving her symptoms.  The patient continues to experience lifestyle limiting pain along the numerous and prominent varicosities to the bilateral legs.  The patient also experiences minimal improvement to her edema which worsens towards the end of the day or with sitting and standing for long periods of time.  This edema is also become more symptomatic and lifestyle limiting.  The patient feels that her symptoms have pressed to the point that she is unable to function on a daily basis.  The patient denies any recent bouts of recurrent cellulitis or ulcer formation to the bilateral legs.  The patient underwent a bilateral lower extremity venous duplex which was notable for Right: Evidence of chronic venous insufficiency detected in the great saphenous vein (right distal thigh and knee level).  Chronic superficial venous thrombosis in  the right small saphenous vein.  No evidence of deep vein thrombosis. Left: Evidence of chronic venous insufficiency is detected in the great saphenous vein (left proximal and distal thigh).  There is no evidence of deep vein or superficial venous thrombosis.  Patient denies any fever, nausea or vomiting.  1. Chronic venous insufficiency - New Patient is found to have chronic venous insufficiency to the bilateral great saphenous veins The patient has a long-standing history of lifestyle limiting symptoms associated with her varicosities and edema located to the bilateral legs The patient has engaged in conservative therapy and the use of a lymphedema pump with minimal improvement to her symptoms The patient would greatly benefit from bilateral endovenous laser ablation to the great saphenous veins.  The patient would like to start with the right lower extremity as this is more symptomatic followed by the left. I have discussed the risks and benefits of the procedure. The risks primarily include DVT, recanalization, bleeding, infection, and inability to gain access. I will applied to the patient's insurance The patient is to continue engaging in conservative therapy and using her lymphedema pump until we receive approval The patient was instructed to call the office in the interim if any worsening edema or ulcerations to the legs, feet or toes occurs. The patient expresses their understanding.  2. Lymphedema - Stable The patient already has a lymphedema pump which she uses 1-2 times a day for an hour each time Provides minimal improvement to her bilateral lower extremity edema and painful varicose veins  3. Varicose veins of both lower extremities with pain - Stable As above  Current Outpatient Medications on File Prior to Visit  Medication Sig Dispense Refill  . albuterol (PROVENTIL HFA;VENTOLIN HFA) 108 (90 Base) MCG/ACT inhaler Inhale 2 puffs into the lungs every 6 (six) hours as needed for  wheezing or shortness of breath.    . ALPRAZolam (XANAX) 0.5 MG tablet TAKE 1 TABLET BY MOUTH AT BEDTIME AS NEEDED 30 tablet 3  . cyanocobalamin 1000 MCG tablet Take 1,000 mcg by mouth daily.    Marland Kitchen diltiazem (CARDIZEM) 120 MG tablet Take 120 mg by mouth 2 times daily at 12 noon and 4 pm.     . flecainide (TAMBOCOR) 50 MG tablet Take 50 mg by  mouth 2 (two) times daily.     Marland Kitchen gabapentin (NEURONTIN) 100 MG capsule TAKE 1 CAPSULE (100 MG TOTAL) BY MOUTH 3 (THREE) TIMES DAILY. 270 capsule 2  . gabapentin (NEURONTIN) 100 MG capsule TAKE 1 CAPSULE BY MOUTH THREE TIMES A DAY 270 capsule 2  . glycerin adult 2 g suppository Place 1 suppository rectally as needed for constipation. 12 suppository 0  . magnesium oxide (MAG-OX) 400 MG tablet Take 400 mg by mouth daily.    . metoprolol tartrate (LOPRESSOR) 100 MG tablet Take 1 tablet (100 mg total) by mouth 2 (two) times daily. 60 tablet 3  . omeprazole (PRILOSEC) 20 MG capsule Take 1 capsule (20 mg total) by mouth at bedtime. 30 capsule 11  . polyethylene glycol powder (GLYCOLAX/MIRALAX) powder Take 17 g by mouth 2 (two) times daily as needed. 3350 g 1  . potassium chloride SA (K-DUR,KLOR-CON) 20 MEQ tablet Take 2 tablets (40 mEq total) by mouth 2 (two) times daily. And an additional 46meq ever Wednesday with metolazone 124 tablet 5  . prochlorperazine (COMPAZINE) 10 MG tablet Take 1 tablet (10 mg total) by mouth every 8 (eight) hours as needed for nausea or vomiting. 30 tablet 1  . Pyridoxine HCl (VITAMIN B6) 200 MG TABS Take 200 tablets by mouth 2 (two) times daily.     Marland Kitchen senna-docusate (SENOKOT-S) 8.6-50 MG tablet Take 1 tablet by mouth daily. 30 tablet 12  . torsemide (DEMADEX) 20 MG tablet Take 2 tablets (40 mg total) by mouth 2 (two) times daily. 120 tablet 5  . warfarin (COUMADIN) 2 MG tablet Take 2 mg by mouth every other day. Take 2mg  one day and take 2.5mg  next day, alternating.    . metolazone (ZAROXOLYN) 2.5 MG tablet Take 1 tablet (2.5 mg total) by  mouth once a week. (Patient taking differently: Take 2.5 mg by mouth as needed. ) 4 tablet 6   No current facility-administered medications on file prior to visit.    There are no Patient Instructions on file for this visit. No follow-ups on file.  Hannah Faulkner A Jomari Bartnik, PA-C

## 2018-01-11 ENCOUNTER — Other Ambulatory Visit: Payer: Self-pay

## 2018-01-11 MED ORDER — TORSEMIDE 20 MG PO TABS: 40.0000 mg | ORAL_TABLET | Freq: Two times a day (BID) | ORAL | 5 refills | Status: AC

## 2018-01-14 DIAGNOSIS — I5032 Chronic diastolic (congestive) heart failure: Secondary | ICD-10-CM | POA: Diagnosis not present

## 2018-01-15 DIAGNOSIS — G4733 Obstructive sleep apnea (adult) (pediatric): Secondary | ICD-10-CM | POA: Diagnosis not present

## 2018-01-18 ENCOUNTER — Telehealth: Payer: Self-pay | Admitting: *Deleted

## 2018-01-18 ENCOUNTER — Ambulatory Visit (INDEPENDENT_AMBULATORY_CARE_PROVIDER_SITE_OTHER): Payer: Medicare Other | Admitting: Family Medicine

## 2018-01-18 VITALS — BP 101/69 | HR 106 | Temp 98.1°F | Wt 149.0 lb

## 2018-01-18 DIAGNOSIS — L989 Disorder of the skin and subcutaneous tissue, unspecified: Secondary | ICD-10-CM | POA: Diagnosis not present

## 2018-01-18 DIAGNOSIS — E7800 Pure hypercholesterolemia, unspecified: Secondary | ICD-10-CM

## 2018-01-18 DIAGNOSIS — I10 Essential (primary) hypertension: Secondary | ICD-10-CM

## 2018-01-18 DIAGNOSIS — G4733 Obstructive sleep apnea (adult) (pediatric): Secondary | ICD-10-CM

## 2018-01-18 DIAGNOSIS — R739 Hyperglycemia, unspecified: Secondary | ICD-10-CM | POA: Diagnosis not present

## 2018-01-18 DIAGNOSIS — E0842 Diabetes mellitus due to underlying condition with diabetic polyneuropathy: Secondary | ICD-10-CM

## 2018-01-18 DIAGNOSIS — I5032 Chronic diastolic (congestive) heart failure: Secondary | ICD-10-CM

## 2018-01-18 NOTE — Progress Notes (Signed)
Hannah Faulkner  MRN: [id number] DOB: 1940/10/09  Subjective:  HPI   The patient is a 77 year old female who presents for follow up of chronic health. She was last seen on 09/15/17. Pt slowly getting weaker over time. Now is Assisted Living. Hypertension BP Readings from Last 3 Encounters:  01/18/18 101/69  01/04/18 102/70  12/11/17 121/77    Diabetes-Her last A1C was on  6/25/219 and was 6.2.  The patient does not check her glucose at home.    Hypercholesterolemia-The patient has not had her lipids checked in over a year. Lab Results  Component Value Date   CHOL 148 11/27/2015   HDL 42 11/27/2015   LDLCALC 84 11/27/2015   TRIG 109 11/27/2015   CHOLHDL 3.5 11/27/2015   The patient has not taken any of her medication today.  When she first came in she was on her portable oxygen tank at 2 LPM.  He pulse ox was 86%.  After increasing her to 3lpm her level did not improve.  I switched her to our tank and she went up to 93%.    Patient Active Problem List   Diagnosis Date Noted  . Chronic venous insufficiency 01/05/2018  . Lymphedema 01/05/2018  . Varicose veins of both lower extremities with pain 11/09/2017  . Dyspnea and respiratory abnormalities 07/17/2017  . Constipation 07/13/2017  . Hypokalemia 11/20/2016  . Lymphedema of both lower extremities 11/20/2016  . Chronic diastolic heart failure ([place]) 09/04/2016  . Restless legs 09/04/2016  . Fatigue 09/04/2016  . Hyperglycemia 08/31/2016  . Acute diastolic heart failure ([place]) 08/26/2016  . Community acquired pneumonia 08/04/2016  . Tricuspid regurgitation 05/15/2015  . Mitral valve regurgitation 05/15/2015  . Anxiety 08/25/2014  . Benign neoplasm of colon 08/25/2014  . 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 Diabetic neuropathy ([place]) 08/25/2014  . Essential hypertension 08/25/2014  . 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 Adiposity 08/25/2014  . Obstructive apnea 08/25/2014  . Hypercholesterolemia without  hypertriglyceridemia 08/25/2014  . B12 deficiency 08/25/2014  . Apnea, sleep 07/15/2013  . AF (paroxysmal atrial fibrillation) ([place]) 07/15/2013    Past Medical History:  Diagnosis Date  . A-fib ([place])   . CHF (congestive heart failure) ([place])   . Hypertension   . Sleep apnea     Social History   Socioeconomic History  . Marital status: Widowed    Spouse name: Not on file  . Number of children: 3  . Years of education: HS  . Highest education level: 12th grade  Occupational History    Employer: RETIRED  Social Needs  . Financial resource strain: Not hard at all  . Food insecurity:    Worry: Never true    Inability: Never true  . Transportation needs:    Medical: Yes    Non-medical: Yes  Tobacco Use  . Smoking status: Never Smoker  . Smokeless tobacco: Never Used  Substance and Sexual Activity  . Alcohol use: No  . Drug use: No  . Sexual activity: Not on file  Lifestyle  . Physical activity:    Days per week: 0 days    Minutes per session: 0 min  . Stress: Only a little  Relationships  . Social connections:    Talks on phone: More than three times a week    Gets together: More than three times a week    Attends religious service: More than 4 times per year    Active member of club or organization: No  Attends meetings of clubs or organizations: Never    Relationship status: Widowed  . Intimate partner violence:    Fear of current or ex partner: Patient refused    Emotionally abused: Patient refused    Physically abused: Patient refused    Forced sexual activity: Patient refused  Other Topics Concern  . Not on file  Social History Narrative  . Not on file    Outpatient Encounter Medications as of 01/18/2018  Medication Sig  . albuterol (PROVENTIL HFA;VENTOLIN HFA) 108 (90 Base) MCG/ACT inhaler Inhale 2 puffs into the lungs every 6 (six) hours as needed for wheezing or shortness of breath.  . ALPRAZolam (XANAX) 0.5 MG tablet TAKE 1 TABLET BY MOUTH AT  BEDTIME AS NEEDED  . cyanocobalamin 1000 MCG tablet Take 1,000 mcg by mouth daily.  Marland Kitchen diltiazem (CARDIZEM) 120 MG tablet Take 120 mg by mouth 2 times daily at 12 noon and 4 pm.   . flecainide (TAMBOCOR) 50 MG tablet Take 50 mg by mouth 2 (two) times daily.   Marland Kitchen gabapentin (NEURONTIN) 100 MG capsule TAKE 1 CAPSULE (100 MG TOTAL) BY MOUTH 3 (THREE) TIMES DAILY.  Marland Kitchen glycerin adult 2 g suppository Place 1 suppository rectally as needed for constipation.  . magnesium oxide (MAG-OX) 400 MG tablet Take 400 mg by mouth daily.  . metoprolol tartrate (LOPRESSOR) 100 MG tablet Take 1 tablet (100 mg total) by mouth 2 (two) times daily.  Marland Kitchen omeprazole (PRILOSEC) 20 MG capsule Take 1 capsule (20 mg total) by mouth at bedtime.  . polyethylene glycol powder (GLYCOLAX/MIRALAX) powder Take 17 g by mouth 2 (two) times daily as needed.  . potassium chloride SA (K-DUR,KLOR-CON) 20 MEQ tablet Take 2 tablets (40 mEq total) by mouth 2 (two) times daily. And an additional 60meq ever Wednesday with metolazone  . prochlorperazine (COMPAZINE) 10 MG tablet Take 1 tablet (10 mg total) by mouth every 8 (eight) hours as needed for nausea or vomiting.  . Pyridoxine HCl (VITAMIN B6) 200 MG TABS Take 200 tablets by mouth 2 (two) times daily.   Marland Kitchen senna-docusate (SENOKOT-S) 8.6-50 MG tablet Take 1 tablet by mouth daily.  Marland Kitchen torsemide (DEMADEX) 20 MG tablet Take 2 tablets (40 mg total) by mouth 2 (two) times daily.  Marland Kitchen warfarin (COUMADIN) 2 MG tablet Take 2 mg by mouth every other day. Take 2mg  one day and take 2.5mg  next day, alternating.  . metolazone (ZAROXOLYN) 2.5 MG tablet Take 1 tablet (2.5 mg total) by mouth once a week. (Patient taking differently: Take 2.5 mg by mouth as needed. )  . [DISCONTINUED] gabapentin (NEURONTIN) 100 MG capsule TAKE 1 CAPSULE BY MOUTH THREE TIMES A DAY   No facility-administered encounter medications on file as of 01/18/2018.     Allergies  Allergen Reactions  . Ampicillin   . Prinzide   [Lisinopril-Hydrochlorothiazide]   . Penicillins Rash    .Has patient had a PCN reaction causing immediate rash, facial/tongue/throat swelling, SOB or lightheadedness with hypotension: Unknown Has patient had a PCN reaction causing severe rash involving mucus membranes or skin necrosis: No Has patient had a PCN reaction that required hospitalization: No Has patient had a PCN reaction occurring within the last 10 years: No If all of the above answers are "NO", then may proceed with Cephalosporin use.     Review of Systems  Constitutional: Positive for malaise/fatigue. Negative for fever.  Eyes: Negative.   Respiratory: Positive for shortness of breath and wheezing (little). Negative for cough.   Cardiovascular: Positive  for orthopnea and leg swelling. Negative for chest pain, palpitations and claudication.  Gastrointestinal: Negative.   Skin: Negative.   Neurological: Negative.   Endo/Heme/Allergies: Negative.   Psychiatric/Behavioral: Negative.     Objective:  BP 101/69 (BP Location: Right Arm, Patient Position: Sitting, Cuff Size: Normal)   Pulse (!) 106   Temp 98.1 F (36.7 C) (Oral)   Wt 149 lb (67.6 kg)   SpO2 93%   BMI 28.15 kg/m   Physical Exam  Constitutional: She is oriented to person, place, and time and well-developed, well-nourished, and in no distress.  HENT:  Head: Normocephalic and atraumatic.  Right Ear: External ear normal.  Left Ear: External ear normal.  Nose: Nose normal.  Eyes: No scleral icterus.  Neck: No thyromegaly present.  Cardiovascular: Normal rate, regular rhythm and normal heart sounds.  Pulmonary/Chest: Effort normal and breath sounds normal.  Abdominal: Soft.  Musculoskeletal: She exhibits edema.  Trace on left--1+ on right.  Lymphadenopathy:    She has no cervical adenopathy.  Neurological: She is alert and oriented to person, place, and time. GCS score is 15.  Skin: Skin is warm and dry.  Psychiatric: Mood, memory, affect and judgment  normal.    Assessment and Plan :  1. Essential hypertension  - Comprehensive metabolic panel  2. Diabetic polyneuropathy associated with diabetes mellitus due to underlying condition ([place])  - CBC with Differential/Platelet  3. Hyperglycemia  - Hemoglobin A1c  4. Hypercholesterolemia without hypertriglyceridemia  - Lipid Panel With LDL/HDL Ratio  5. Lesion of skin of nose Possible BCC.? - Ambulatory referral to Dermatology  6. Chronic diastolic heart failure (HCC) On chronic oxygen.More than 50% of 25 minute visit spent in counseling/coordination of care. Hypoxic without oxygen. 7. Obstructive apnea  I have done the exam and reviewed the chart and it is accurate to the best of my knowledge. Development worker, community has been used and  any errors in dictation or transcription are unintentional. Miguel Aschoff M.D. [facility]

## 2018-01-18 NOTE — Telephone Encounter (Signed)
LMTCB   Auto cpap with pressure range of 5-20 cmH20 ONO on cpap Last ov 12/07/17.

## 2018-01-19 NOTE — Telephone Encounter (Signed)
Spoke to patient with sleep study results. Patient understands with no further questions at this time. Orders placed.

## 2018-01-19 NOTE — Telephone Encounter (Signed)
2nd message left to call back

## 2018-02-01 DIAGNOSIS — Z79899 Other long term (current) drug therapy: Secondary | ICD-10-CM | POA: Diagnosis not present

## 2018-02-01 DIAGNOSIS — I480 Paroxysmal atrial fibrillation: Secondary | ICD-10-CM | POA: Diagnosis not present

## 2018-02-03 ENCOUNTER — Other Ambulatory Visit: Payer: Self-pay

## 2018-02-03 ENCOUNTER — Encounter: Payer: Self-pay | Admitting: Emergency Medicine

## 2018-02-03 ENCOUNTER — Emergency Department: Payer: Medicare Other

## 2018-02-03 ENCOUNTER — Inpatient Hospital Stay
Admission: EM | Admit: 2018-02-03 | Discharge: 2018-02-21 | DRG: 308 | Disposition: E | Payer: Medicare Other | Attending: Internal Medicine | Admitting: Internal Medicine

## 2018-02-03 DIAGNOSIS — Z9981 Dependence on supplemental oxygen: Secondary | ICD-10-CM | POA: Diagnosis not present

## 2018-02-03 DIAGNOSIS — Z7901 Long term (current) use of anticoagulants: Secondary | ICD-10-CM | POA: Diagnosis not present

## 2018-02-03 DIAGNOSIS — K769 Liver disease, unspecified: Secondary | ICD-10-CM | POA: Diagnosis not present

## 2018-02-03 DIAGNOSIS — Z86010 Personal history of colonic polyps: Secondary | ICD-10-CM | POA: Diagnosis not present

## 2018-02-03 DIAGNOSIS — I5030 Unspecified diastolic (congestive) heart failure: Secondary | ICD-10-CM | POA: Diagnosis not present

## 2018-02-03 DIAGNOSIS — I5033 Acute on chronic diastolic (congestive) heart failure: Secondary | ICD-10-CM | POA: Diagnosis not present

## 2018-02-03 DIAGNOSIS — R634 Abnormal weight loss: Secondary | ICD-10-CM | POA: Diagnosis present

## 2018-02-03 DIAGNOSIS — I5032 Chronic diastolic (congestive) heart failure: Secondary | ICD-10-CM | POA: Diagnosis not present

## 2018-02-03 DIAGNOSIS — K7290 Hepatic failure, unspecified without coma: Secondary | ICD-10-CM | POA: Diagnosis present

## 2018-02-03 DIAGNOSIS — J90 Pleural effusion, not elsewhere classified: Secondary | ICD-10-CM | POA: Diagnosis present

## 2018-02-03 DIAGNOSIS — J9621 Acute and chronic respiratory failure with hypoxia: Secondary | ICD-10-CM | POA: Diagnosis not present

## 2018-02-03 DIAGNOSIS — Z888 Allergy status to other drugs, medicaments and biological substances status: Secondary | ICD-10-CM

## 2018-02-03 DIAGNOSIS — I110 Hypertensive heart disease with heart failure: Secondary | ICD-10-CM | POA: Diagnosis present

## 2018-02-03 DIAGNOSIS — R0602 Shortness of breath: Secondary | ICD-10-CM

## 2018-02-03 DIAGNOSIS — Z515 Encounter for palliative care: Secondary | ICD-10-CM | POA: Diagnosis not present

## 2018-02-03 DIAGNOSIS — R0600 Dyspnea, unspecified: Secondary | ICD-10-CM

## 2018-02-03 DIAGNOSIS — J189 Pneumonia, unspecified organism: Secondary | ICD-10-CM | POA: Diagnosis present

## 2018-02-03 DIAGNOSIS — E1140 Type 2 diabetes mellitus with diabetic neuropathy, unspecified: Secondary | ICD-10-CM | POA: Diagnosis not present

## 2018-02-03 DIAGNOSIS — Z880 Allergy status to penicillin: Secondary | ICD-10-CM

## 2018-02-03 DIAGNOSIS — I4891 Unspecified atrial fibrillation: Secondary | ICD-10-CM | POA: Diagnosis not present

## 2018-02-03 DIAGNOSIS — Z8249 Family history of ischemic heart disease and other diseases of the circulatory system: Secondary | ICD-10-CM

## 2018-02-03 DIAGNOSIS — K7689 Other specified diseases of liver: Secondary | ICD-10-CM | POA: Diagnosis not present

## 2018-02-03 DIAGNOSIS — I10 Essential (primary) hypertension: Secondary | ICD-10-CM | POA: Diagnosis not present

## 2018-02-03 DIAGNOSIS — R188 Other ascites: Secondary | ICD-10-CM | POA: Diagnosis not present

## 2018-02-03 DIAGNOSIS — R160 Hepatomegaly, not elsewhere classified: Secondary | ICD-10-CM | POA: Diagnosis present

## 2018-02-03 DIAGNOSIS — G4733 Obstructive sleep apnea (adult) (pediatric): Secondary | ICD-10-CM | POA: Diagnosis present

## 2018-02-03 DIAGNOSIS — Z79899 Other long term (current) drug therapy: Secondary | ICD-10-CM

## 2018-02-03 DIAGNOSIS — Z9889 Other specified postprocedural states: Secondary | ICD-10-CM

## 2018-02-03 DIAGNOSIS — D126 Benign neoplasm of colon, unspecified: Secondary | ICD-10-CM

## 2018-02-03 DIAGNOSIS — Z8701 Personal history of pneumonia (recurrent): Secondary | ICD-10-CM

## 2018-02-03 DIAGNOSIS — E875 Hyperkalemia: Secondary | ICD-10-CM | POA: Diagnosis not present

## 2018-02-03 DIAGNOSIS — C779 Secondary and unspecified malignant neoplasm of lymph node, unspecified: Secondary | ICD-10-CM | POA: Diagnosis not present

## 2018-02-03 DIAGNOSIS — K7460 Unspecified cirrhosis of liver: Secondary | ICD-10-CM | POA: Diagnosis present

## 2018-02-03 DIAGNOSIS — I4819 Other persistent atrial fibrillation: Principal | ICD-10-CM | POA: Diagnosis present

## 2018-02-03 DIAGNOSIS — N179 Acute kidney failure, unspecified: Secondary | ICD-10-CM | POA: Diagnosis not present

## 2018-02-03 DIAGNOSIS — J9601 Acute respiratory failure with hypoxia: Secondary | ICD-10-CM | POA: Diagnosis not present

## 2018-02-03 DIAGNOSIS — Z6823 Body mass index (BMI) 23.0-23.9, adult: Secondary | ICD-10-CM

## 2018-02-03 DIAGNOSIS — Z66 Do not resuscitate: Secondary | ICD-10-CM | POA: Diagnosis present

## 2018-02-03 DIAGNOSIS — I5031 Acute diastolic (congestive) heart failure: Secondary | ICD-10-CM | POA: Diagnosis not present

## 2018-02-03 LAB — BASIC METABOLIC PANEL WITH GFR
Anion gap: 8 (ref 5–15)
BUN: 15 mg/dL (ref 8–23)
CO2: 32 mmol/L (ref 22–32)
Calcium: 9.1 mg/dL (ref 8.9–10.3)
Chloride: 100 mmol/L (ref 98–111)
Creatinine, Ser: 0.81 mg/dL (ref 0.44–1.00)
GFR calc Af Amer: >60 mL/min
GFR calc non Af Amer: >60 mL/min
Glucose, Bld: 128 mg/dL — ABNORMAL HIGH (ref 70–99)
Potassium: 4.8 mmol/L (ref 3.5–5.1)
Sodium: 140 mmol/L (ref 135–145)

## 2018-02-03 LAB — CBC
HEMATOCRIT: 41.1 % (ref 36.0–46.0)
Hemoglobin: 12.2 g/dL (ref 12.0–15.0)
MCH: 26.3 pg (ref 26.0–34.0)
MCHC: 29.7 g/dL — ABNORMAL LOW (ref 30.0–36.0)
MCV: 88.8 fL (ref 80.0–100.0)
NRBC: 0 % (ref 0.0–0.2)
PLATELETS: 275 10*3/uL (ref 150–400)
RBC: 4.63 MIL/uL (ref 3.87–5.11)
RDW: 16.3 % — ABNORMAL HIGH (ref 11.5–15.5)
WBC: 13.4 10*3/uL — ABNORMAL HIGH (ref 4.0–10.5)

## 2018-02-03 LAB — PROTIME-INR
INR: 3.4
Prothrombin Time: 33.8 seconds — ABNORMAL HIGH (ref 11.4–15.2)

## 2018-02-03 LAB — HEPATIC FUNCTION PANEL
ALK PHOS: 110 U/L (ref 38–126)
ALT: 16 U/L (ref 0–44)
AST: 34 U/L (ref 15–41)
Albumin: 3.6 g/dL (ref 3.5–5.0)
BILIRUBIN DIRECT: 0.3 mg/dL — AB (ref 0.0–0.2)
BILIRUBIN INDIRECT: 0.9 mg/dL (ref 0.3–0.9)
Total Bilirubin: 1.2 mg/dL (ref 0.3–1.2)
Total Protein: 7.1 g/dL (ref 6.5–8.1)

## 2018-02-03 LAB — BRAIN NATRIURETIC PEPTIDE: B NATRIURETIC PEPTIDE 5: 722 pg/mL — AB (ref 0.0–100.0)

## 2018-02-03 LAB — TROPONIN I: Troponin I: <0.03 ng/mL (ref <0.03)

## 2018-02-03 LAB — LACTATE DEHYDROGENASE: LDH: 130 U/L (ref 98–192)

## 2018-02-03 MED ORDER — POLYETHYLENE GLYCOL 3350 17 G PO PACK: 17.0000 g | PACK | Freq: Two times a day (BID) | ORAL | Status: DC | PRN

## 2018-02-03 MED ORDER — ONDANSETRON HCL 4 MG/2ML IJ SOLN
4.0000 mg | Freq: Four times a day (QID) | INTRAMUSCULAR | Status: DC | PRN
  Administered 2018-02-04 – 2018-02-07 (×5): 4 mg via INTRAVENOUS
  Filled 2018-02-03 (×6): qty 2

## 2018-02-03 MED ORDER — ACETAMINOPHEN 325 MG PO TABS: 650.0000 mg | ORAL_TABLET | Freq: Four times a day (QID) | ORAL | Status: DC | PRN

## 2018-02-03 MED ORDER — FUROSEMIDE 10 MG/ML IJ SOLN
40.0000 mg | Freq: Two times a day (BID) | INTRAMUSCULAR | Status: DC
  Administered 2018-02-04: 40 mg via INTRAVENOUS
  Filled 2018-02-03: qty 4

## 2018-02-03 MED ORDER — DILTIAZEM LOAD VIA INFUSION
10.0000 mg | Freq: Once | INTRAVENOUS | Status: AC
  Administered 2018-02-03: 10 mg via INTRAVENOUS
  Filled 2018-02-03: qty 10

## 2018-02-03 MED ORDER — PANTOPRAZOLE SODIUM 40 MG PO TBEC
40.0000 mg | DELAYED_RELEASE_TABLET | Freq: Every day | ORAL | Status: DC
  Administered 2018-02-04 – 2018-02-07 (×4): 40 mg via ORAL
  Filled 2018-02-03 (×4): qty 1

## 2018-02-03 MED ORDER — MAGNESIUM OXIDE 400 (241.3 MG) MG PO TABS
400.0000 mg | ORAL_TABLET | Freq: Every day | ORAL | Status: DC
  Administered 2018-02-04 – 2018-02-07 (×4): 400 mg via ORAL
  Filled 2018-02-03 (×4): qty 1

## 2018-02-03 MED ORDER — FLECAINIDE ACETATE 50 MG PO TABS
50.0000 mg | ORAL_TABLET | Freq: Two times a day (BID) | ORAL | Status: DC
  Administered 2018-02-03 – 2018-02-07 (×8): 50 mg via ORAL
  Filled 2018-02-03 (×11): qty 1

## 2018-02-03 MED ORDER — VITAMIN B-6 50 MG PO TABS
200.0000 mg | ORAL_TABLET | Freq: Two times a day (BID) | ORAL | Status: DC
  Administered 2018-02-04 – 2018-02-07 (×8): 200 mg via ORAL
  Filled 2018-02-03 (×7): qty 4
  Filled 2018-02-03: qty 1
  Filled 2018-02-03 (×4): qty 4

## 2018-02-03 MED ORDER — ACETAMINOPHEN 650 MG RE SUPP: 650.0000 mg | Freq: Four times a day (QID) | RECTAL | Status: DC | PRN

## 2018-02-03 MED ORDER — DILTIAZEM HCL-DEXTROSE 100-5 MG/100ML-% IV SOLN (PREMIX)
5.0000 mg/h | Freq: Once | INTRAVENOUS | Status: AC
  Administered 2018-02-03: 5 mg/h via INTRAVENOUS
  Filled 2018-02-03: qty 100

## 2018-02-03 MED ORDER — FUROSEMIDE 10 MG/ML IJ SOLN
40.0000 mg | Freq: Once | INTRAMUSCULAR | Status: AC
  Administered 2018-02-03: 40 mg via INTRAVENOUS
  Filled 2018-02-03: qty 4

## 2018-02-03 MED ORDER — DILTIAZEM HCL 25 MG/5ML IV SOLN: 10.0000 mg | Freq: Once | INTRAVENOUS | Status: DC

## 2018-02-03 MED ORDER — METOPROLOL TARTRATE 50 MG PO TABS
100.0000 mg | ORAL_TABLET | Freq: Two times a day (BID) | ORAL | Status: DC
  Administered 2018-02-03 – 2018-02-07 (×8): 100 mg via ORAL
  Filled 2018-02-03 (×9): qty 2

## 2018-02-03 MED ORDER — POTASSIUM CHLORIDE CRYS ER 20 MEQ PO TBCR
40.0000 meq | EXTENDED_RELEASE_TABLET | Freq: Two times a day (BID) | ORAL | Status: DC
  Administered 2018-02-04 – 2018-02-06 (×6): 40 meq via ORAL
  Filled 2018-02-03 (×7): qty 2

## 2018-02-03 MED ORDER — VITAMIN B-12 1000 MCG PO TABS
1000.0000 ug | ORAL_TABLET | Freq: Every day | ORAL | Status: DC
  Administered 2018-02-04 – 2018-02-07 (×4): 1000 ug via ORAL
  Filled 2018-02-03 (×4): qty 1

## 2018-02-03 MED ORDER — GABAPENTIN 100 MG PO CAPS
100.0000 mg | ORAL_CAPSULE | Freq: Three times a day (TID) | ORAL | Status: DC
  Administered 2018-02-03 – 2018-02-07 (×13): 100 mg via ORAL
  Filled 2018-02-03 (×13): qty 1

## 2018-02-03 MED ORDER — IOHEXOL 300 MG/ML  SOLN
75.0000 mL | Freq: Once | INTRAMUSCULAR | Status: AC | PRN
  Administered 2018-02-03: 75 mL via INTRAVENOUS

## 2018-02-03 MED ORDER — ALBUTEROL SULFATE (2.5 MG/3ML) 0.083% IN NEBU
3.0000 mL | INHALATION_SOLUTION | Freq: Four times a day (QID) | RESPIRATORY_TRACT | Status: DC | PRN
  Administered 2018-02-04: 3 mL via RESPIRATORY_TRACT
  Filled 2018-02-03: qty 3

## 2018-02-03 MED ORDER — ONDANSETRON HCL 4 MG PO TABS: 4.0000 mg | ORAL_TABLET | Freq: Four times a day (QID) | ORAL | Status: DC | PRN

## 2018-02-03 MED ORDER — SENNOSIDES-DOCUSATE SODIUM 8.6-50 MG PO TABS
1.0000 | ORAL_TABLET | Freq: Every day | ORAL | Status: DC
  Administered 2018-02-04 – 2018-02-07 (×4): 1 via ORAL
  Filled 2018-02-03 (×4): qty 1

## 2018-02-03 MED ORDER — DILTIAZEM HCL-DEXTROSE 100-5 MG/100ML-% IV SOLN (PREMIX)
5.0000 mg/h | INTRAVENOUS | Status: DC
  Administered 2018-02-03 – 2018-02-04 (×2): 7.5 mg/h via INTRAVENOUS
  Filled 2018-02-03: qty 100

## 2018-02-03 MED ORDER — LORAZEPAM 2 MG/ML IJ SOLN
1.0000 mg | INTRAMUSCULAR | Status: AC
  Administered 2018-02-04: 1 mg via INTRAVENOUS
  Filled 2018-02-03: qty 1

## 2018-02-03 NOTE — Progress Notes (Signed)
Pt refused cpap

## 2018-02-03 NOTE — ED Triage Notes (Signed)
Brought over by [facility]. Stated Dr Josefa Half spoke to an  ED MD and was told to bring her in. Pt has a heart rate of 140. Pt did not take her metoprolol this am because she felt bad. Pt was given 100mg  of metoprolol 45 minutes ago.

## 2018-02-03 NOTE — Plan of Care (Signed)
Patient was admitted at 2130. No complaints of pain. Patient profile completed. Family is at the bedside. Heart rate fluctuates to the 120's. [facility] has been notified of cardizem drip in place. Will continue to monitor and assess.

## 2018-02-03 NOTE — H&P (Signed)
[facility] [place] NAME: Hannah Faulkner    MR#:  [id number]  DATE OF BIRTH:  04-Sep-1940  DATE OF ADMISSION:  02/07/2018  PRIMARY CARE PHYSICIAN: Jerrol Banana., MD   REQUESTING/REFERRING PHYSICIAN: Dr. Shirlyn Goltz  CHIEF COMPLAINT:   Chief Complaint  Patient presents with  . Shortness of Breath  . Atrial Fibrillation    HISTORY OF PRESENT ILLNESS:  Hannah Faulkner  is a 77 y.o. female with a known history of persistent atrial fibrillation on Coumadin, diastolic CHF on 2l home oxygen, hypertension and sleep apnea presents to hospital secondary to worsening malaise, shortness of breath and palpitations. Since last year admission for pneumonia, patient had persistent A. fib and peripheral edema in spite of diuretic therapy.  Over the last 2 weeks, she has been miserable.  Very fatigued and also complains of malaise, palpitations, orthopnea.  Denies any fevers but has chills.  No nausea or vomiting but had episodes of vomiting after bouts of coughing.  INR 2 days ago was greater than 8 and so Coumadin has been on hold.  Poor oral intake in the last couple of weeks.  Went to see her cardiologist today secondary to persistent palpitations and orthopnea and noted to have elevated heart rate and dyspnea and so sent over to the emergency room.  She states she has been following a low-sodium diet and also following with the CHF clinic. Heart rate Remained elevated here in the emergency room and is now started on Cardizem drip.  CT of the chest did not show any PE but showing large pleural effusion on the right side and also a large soft tissue mass in the left hepatic lobe of the liver.  She is being admitted for further management.  PAST MEDICAL HISTORY:   Past Medical History:  Diagnosis Date  . A-fib ([place])   . CHF (congestive heart failure) (HCC)    diastolic- on 2l home o2  . Hypertension   . Sleep apnea     PAST SURGICAL HISTORY:   Past  Surgical History:  Procedure Laterality Date  . CARPAL TUNNEL RELEASE Right   . EYE SURGERY    . POLYPECTOMY     colon poly premoved    SOCIAL HISTORY:   Social History   Tobacco Use  . Smoking status: Never Smoker  . Smokeless tobacco: Never Used  Substance Use Topics  . Alcohol use: No    FAMILY HISTORY:   Family History  Problem Relation Age of Onset  . Hypertension Mother   . Heart disease Mother   . CVA Mother   . Heart attack Mother   . Lung cancer Father   . Heart attack Sister   . CVA Sister   . Thyroid disease Sister   . Liver cancer Brother   . Thyroid disease Sister   . Thyroid disease Sister   . Diabetes Sister   . Dementia Sister   . Atrial fibrillation Sister     DRUG ALLERGIES:   Allergies  Allergen Reactions  . Ampicillin   . Prinzide  [Lisinopril-Hydrochlorothiazide]   . Penicillins Rash    .Has patient had a PCN reaction causing immediate rash, facial/tongue/throat swelling, SOB or lightheadedness with hypotension: Unknown Has patient had a PCN reaction causing severe rash involving mucus membranes or skin necrosis: No Has patient had a PCN reaction that required hospitalization: No Has patient had a PCN reaction occurring within the last 10 years: No  If all of the above answers are "NO", then may proceed with Cephalosporin use.     REVIEW OF SYSTEMS:   Review of Systems  Constitutional: Positive for chills, malaise/fatigue and weight loss. Negative for fever.  HENT: Negative for ear discharge, ear pain, hearing loss and nosebleeds.   Eyes: Negative for blurred vision, double vision and photophobia.  Respiratory: Positive for cough and shortness of breath. Negative for hemoptysis and wheezing.   Cardiovascular: Positive for palpitations, orthopnea and leg swelling. Negative for chest pain.  Gastrointestinal: Positive for abdominal pain and nausea. Negative for constipation, diarrhea, heartburn, melena and vomiting.  Genitourinary:  Negative for dysuria, frequency, hematuria and urgency.  Musculoskeletal: Negative for back pain, myalgias and neck pain.  Skin: Negative for rash.  Neurological: Negative for tingling, tremors, sensory change, speech change, focal weakness and headaches.  Endo/Heme/Allergies: Does not bruise/bleed easily.  Psychiatric/Behavioral: Negative for depression.    MEDICATIONS AT HOME:   Prior to Admission medications   Medication Sig Start Date End Date Taking? Authorizing Provider  albuterol (PROVENTIL HFA;VENTOLIN HFA) 108 (90 Base) MCG/ACT inhaler Inhale 2 puffs into the lungs every 6 (six) hours as needed for wheezing or shortness of breath.    [provider]  ALPRAZolam Duanne Moron) 0.5 MG tablet TAKE 1 TABLET BY MOUTH AT BEDTIME AS NEEDED 11/10/17   Jerrol Banana., MD  cyanocobalamin 1000 MCG tablet Take 1,000 mcg by mouth daily.    [provider]  diltiazem (CARDIZEM) 120 MG tablet Take 120 mg by mouth 2 times daily at 12 noon and 4 pm.     [provider]  flecainide (TAMBOCOR) 50 MG tablet Take 50 mg by mouth 2 (two) times daily.  11/16/13   [provider]  gabapentin (NEURONTIN) 100 MG capsule TAKE 1 CAPSULE (100 MG TOTAL) BY MOUTH 3 (THREE) TIMES DAILY. 12/21/16   Jerrol Banana., MD  glycerin adult 2 g suppository Place 1 suppository rectally as needed for constipation. 06/18/17   Merlyn Lot, MD  magnesium oxide (MAG-OX) 400 MG tablet Take 400 mg by mouth daily.    [provider]  metolazone (ZAROXOLYN) 2.5 MG tablet Take 1 tablet (2.5 mg total) by mouth once a week. Patient taking differently: Take 2.5 mg by mouth as needed.  05/15/17 11/19/17  Alisa Graff, FNP  metoprolol tartrate (LOPRESSOR) 100 MG tablet Take 1 tablet (100 mg total) by mouth 2 (two) times daily. 08/31/16   Theodoro Grist, MD  omeprazole (PRILOSEC) 20 MG capsule Take 1 capsule (20 mg total) by mouth at bedtime. 04/15/17   Jerrol Banana., MD    polyethylene glycol powder [facility]) powder Take 17 g by mouth 2 (two) times daily as needed. 06/16/17   Jerrol Banana., MD  potassium chloride SA (K-DUR,KLOR-CON) 20 MEQ tablet Take 2 tablets (40 mEq total) by mouth 2 (two) times daily. And an additional 69meq ever Wednesday with metolazone 10/19/17   Alisa Graff, FNP  prochlorperazine (COMPAZINE) 10 MG tablet Take 1 tablet (10 mg total) by mouth every 8 (eight) hours as needed for nausea or vomiting. 11/09/17   Alisa Graff, FNP  Pyridoxine HCl (VITAMIN B6) 200 MG TABS Take 200 tablets by mouth 2 (two) times daily.  11/16/13   [provider]  senna-docusate (SENOKOT-S) 8.6-50 MG tablet Take 1 tablet by mouth daily. 06/16/17   Jerrol Banana., MD  torsemide (DEMADEX) 20 MG tablet Take 2 tablets (40 mg total) by  mouth 2 (two) times daily. 01/11/18   Alisa Graff, FNP  warfarin (COUMADIN) 2 MG tablet Take 2 mg by mouth every other day. Take 2mg  one day and take 2.5mg  next day, alternating.    [provider]      VITAL SIGNS:  Blood pressure 123/85, pulse (!) 122, temperature 98.1 F (36.7 C), temperature source Oral, resp. rate (!) 22, SpO2 93 %.  PHYSICAL EXAMINATION:   Physical Exam  GENERAL:  77 y.o.-year-old patient lying in the bed, appears dyspneic EYES: Pupils equal, round, reactive to light and accommodation. No scleral icterus. Extraocular muscles intact.  HEENT: Head atraumatic, normocephalic. Oropharynx and nasopharynx clear.  NECK:  Supple, no jugular venous distention. No thyroid enlargement, no tenderness.  No supraclavicular lymphadenopathy palpable LUNGS: Decreased right-sided breath sounds, moving air on the left side with fine bibasilar crackles.  No wheezing or rhonchi. Not using accessory muscles to breathe CARDIOVASCULAR: S1, S2 rapid and irregular. No  rubs, or gallops.  2/6 systolic murmur is present ABDOMEN: Soft, nontender, but distended. Bowel sounds present. No  organomegaly or mass.  EXTREMITIES: No  cyanosis, or clubbing.  1+ bilateral pedal edema NEUROLOGIC: Cranial nerves II through XII are intact. Muscle strength 5/5 in all extremities. Sensation intact. Gait not checked.  Global weakness noted  PSYCHIATRIC: The patient is alert and oriented x 3.  SKIN: No obvious rash, lesion, or ulcer.   LABORATORY PANEL:   CBC Recent Labs  Lab 02/19/2018 1656  WBC 13.4*  HGB 12.2  HCT 41.1  PLT 275   ------------------------------------------------------------------------------------------------------------------  Chemistries  Recent Labs  Lab 02/02/2018 1656  NA 140  K 4.8  CL 100  CO2 32  GLUCOSE 128*  BUN 15  CREATININE 0.81  CALCIUM 9.1   ------------------------------------------------------------------------------------------------------------------  Cardiac Enzymes Recent Labs  Lab 01/26/2018 1706  TROPONINI <0.03   ------------------------------------------------------------------------------------------------------------------  RADIOLOGY:  Dg Chest 2 View  Result Date: 01/25/2018 CLINICAL DATA:  Short of breath.  Exhausted. EXAM: CHEST - 2 VIEW COMPARISON:  06/27/2017 FINDINGS: Large right pleural effusion. Interstitial thickening of the left lung. No significant left pleural effusion. No pneumothorax. Stable cardiomegaly. No acute osseous abnormality. IMPRESSION: Large right pleural effusion. Stable cardiomegaly. Mild left lower lung airspace disease likely reflecting atelectasis. Electronically Signed   By: Kathreen Devoid   On: 02/07/2018 17:35   Ct Chest W Contrast  Result Date: 01/30/2018 CLINICAL DATA:  Increased heart rate, chest pain, shortness of breath EXAM: CT CHEST WITH CONTRAST TECHNIQUE: Multidetector CT imaging of the chest was performed during intravenous contrast administration. CONTRAST:  31mL OMNIPAQUE IOHEXOL 300 MG/ML  SOLN COMPARISON:  08/06/2016, 06/18/2017 FINDINGS: Cardiovascular: No significant vascular  findings. Mild cardiomegaly. No pericardial effusion. Mediastinum/Nodes: No enlarged mediastinal, hilar, or axillary lymph nodes. Thyroid gland, trachea, and esophagus demonstrate no significant findings. Lungs/Pleura: Large right pleural effusion. Trace left pleural effusion. Mild left interstitial thickening and patchy areas of ground-glass opacity. Mild ground-glass opacity in the aerated portions of the right lung. No pneumothorax. Upper Abdomen: Ill-defined 5.9 x 9.1 cm nodular soft tissue mass in the left hepatic lobe with small volume perihepatic ascites. Musculoskeletal: No acute osseous abnormality. No aggressive osseous lesion. Degenerative disc disease with disc height loss throughout the thoracolumbar spine. IMPRESSION: 1. Large right pleural effusion. 2. Ill-defined 5.9 x 9.1 cm nodular soft tissue mass in the left hepatic lobe with small volume perihepatic ascites. Finding is concerning for metastatic disease versus hepatocellular carcinoma. Recommend further evaluation with nonemergent MRI of the abdomen without  and with intravenous contrast. Electronically Signed   By: Kathreen Devoid   On: 01/23/2018 18:34    EKG:   Orders placed or performed during the hospital encounter of 02/02/2018  . ED EKG within 10 minutes  . EKG 12-Lead  . EKG 12-Lead  . ED EKG within 10 minutes  . EKG 12-Lead  . EKG 12-Lead    IMPRESSION AND PLAN:   Hannah Faulkner  is a 77 y.o. female with a known history of persistent atrial fibrillation on Coumadin, diastolic CHF on 2l home oxygen, hypertension and sleep apnea presents to hospital secondary to worsening malaise, shortness of breath and palpitations.  1.  A. fib with RVR-admit to telemetry, -Continue home medications.  Also started on Cardizem drip. -2D echo, cardiology consult recommended -INR is supratherapeutic, Coumadin is on hold.  Continue to hold in case she needs biopsy or pleural tap.  If INR becomes subtherapeutic, can start heparin drip  2.   Diastolic CHF-acute on chronic exacerbation, secondary to A. fib RVR -IV Lasix for now -Low-sodium diet, daily weights and strict input output monitoring. -Large right pleural effusion-thoracentesis recommended  3.  6 x 10 cm soft tissue mass in the liver-it is new compared to her CT chest done last year and only cirrhosis was noted. -No history of hepatitis or alcohol abuse -Concern for metastatic disease versus primary hepatocellular carcinoma.  Brother also had liver cancer. -MRI of the abdomen ordered. -Oncology consulted, follow-up tumor markers  4.  Sleep apnea-on CPAP  5.  DVT prophylaxis-currently has supratherapeutic INR.  Monitor.  Start heparin drip if INR is subtherapeutic especially in case she needs any procedures   All the records are reviewed and case discussed with ED provider. Management plans discussed with the patient, family and they are in agreement.  CODE STATUS: DNR  TOTAL TIME TAKING CARE OF THIS PATIENT: 56 minutes.    Gladstone Lighter M.D on 02/11/2018 at 7:35 PM  Between 7am to 6pm - Pager - [phone]  After 6pm go to [email or web address] - password [email or web address] [place] [facility]  Office  [phone]  CC: Primary care physician; Jerrol Banana., MD

## 2018-02-03 NOTE — ED Triage Notes (Signed)
Pt to ED from [facility] for increased HR. Per family HR was 124-144. Pt denies chest pain.

## 2018-02-03 NOTE — ED Notes (Signed)
Lorriane Shire RN, aware of bed assigned

## 2018-02-03 NOTE — ED Provider Notes (Signed)
[facility] EMERGENCY DEPARTMENT Provider Note   CSN: [id number] Arrival date & time: 01/22/2018  1625     History   Chief Complaint Chief Complaint  Patient presents with  . Shortness of Breath  . Atrial Fibrillation    HPI Hannah Faulkner is a 77 y.o. female history of atrial fibrillation on Coumadin, diastolic heart failure, hypertension here presenting with shortness of breath, palpitations.  Patient states that she has been short of breath and weak for last several weeks.  Patient states that she wears 2 L nasal cannula at baseline and she gets worsening shortness of breath when she walks.  She was seen at the clinic 2 days ago and had a INR check and her INR was 8 so she was told to stop Coumadin for several days.  She followed with Dr. Saralyn Pilar, her cardiologist today.  He noticed that she is in rapid A. fib with a heart rate of 150s.  She was given metoprolol 100 mg p.o. but still remained tachycardic to 120-130 so sent here for evaluation.  Recommendation from the cardiologist was to give IV Cardizem which she is prescribed for. Denies any fevers, but has nonproductive cough. Also has worsening bilateral leg swelling for the last several weeks.   The history is provided by the patient.    Past Medical History:  Diagnosis Date  . A-fib ([place])   . CHF (congestive heart failure) ([place])   . Hypertension   . Sleep apnea     Patient Active Problem List   Diagnosis Date Noted  . Chronic venous insufficiency 01/05/2018  . Lymphedema 01/05/2018  . Varicose veins of both lower extremities with pain 11/09/2017  . Dyspnea and respiratory abnormalities 07/17/2017  . Constipation 07/13/2017  . Hypokalemia 11/20/2016  . Lymphedema of both lower extremities 11/20/2016  . Chronic diastolic heart failure ([place]) 09/04/2016  . Restless legs 09/04/2016  . Fatigue 09/04/2016  . Hyperglycemia 08/31/2016  . Acute diastolic heart failure ([place]) 08/26/2016  . Community  acquired pneumonia 08/04/2016  . Tricuspid regurgitation 05/15/2015  . Mitral valve regurgitation 05/15/2015  . Anxiety 08/25/2014  . Benign neoplasm of colon 08/25/2014  . 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 Diabetic neuropathy ([place]) 08/25/2014  . Essential hypertension 08/25/2014  . 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 Adiposity 08/25/2014  . Obstructive apnea 08/25/2014  . Hypercholesterolemia without hypertriglyceridemia 08/25/2014  . B12 deficiency 08/25/2014  . Apnea, sleep 07/15/2013  . AF (paroxysmal atrial fibrillation) ([place]) 07/15/2013    Past Surgical History:  Procedure Laterality Date  . EYE SURGERY    . POLYPECTOMY     colon poly premoved     OB History    Gravida  3   Para  3   Term      Preterm      AB      Living        SAB      TAB      Ectopic      Multiple      Live Births               Home Medications    Prior to Admission medications   Medication Sig Start Date End Date Taking? Authorizing Provider  albuterol (PROVENTIL HFA;VENTOLIN HFA) 108 (90 Base) MCG/ACT inhaler Inhale 2 puffs into the lungs every 6 (six) hours as needed for wheezing or shortness of breath.    [provider]  ALPRAZolam Duanne Moron) 0.5 MG tablet TAKE 1  TABLET BY MOUTH AT BEDTIME AS NEEDED 11/10/17   Jerrol Banana., MD  cyanocobalamin 1000 MCG tablet Take 1,000 mcg by mouth daily.    [provider]  diltiazem (CARDIZEM) 120 MG tablet Take 120 mg by mouth 2 times daily at 12 noon and 4 pm.     [provider]  flecainide (TAMBOCOR) 50 MG tablet Take 50 mg by mouth 2 (two) times daily.  11/16/13   [provider]  gabapentin (NEURONTIN) 100 MG capsule TAKE 1 CAPSULE (100 MG TOTAL) BY MOUTH 3 (THREE) TIMES DAILY. 12/21/16   Jerrol Banana., MD  glycerin adult 2 g suppository Place 1 suppository rectally as needed for constipation. 06/18/17   Merlyn Lot, MD  magnesium oxide (MAG-OX) 400 MG tablet Take 400 mg  by mouth daily.    [provider]  metolazone (ZAROXOLYN) 2.5 MG tablet Take 1 tablet (2.5 mg total) by mouth once a week. Patient taking differently: Take 2.5 mg by mouth as needed.  05/15/17 11/19/17  Alisa Graff, FNP  metoprolol tartrate (LOPRESSOR) 100 MG tablet Take 1 tablet (100 mg total) by mouth 2 (two) times daily. 08/31/16   Theodoro Grist, MD  omeprazole (PRILOSEC) 20 MG capsule Take 1 capsule (20 mg total) by mouth at bedtime. 04/15/17   Jerrol Banana., MD  polyethylene glycol powder [facility]) powder Take 17 g by mouth 2 (two) times daily as needed. 06/16/17   Jerrol Banana., MD  potassium chloride SA (K-DUR,KLOR-CON) 20 MEQ tablet Take 2 tablets (40 mEq total) by mouth 2 (two) times daily. And an additional 95meq ever Wednesday with metolazone 10/19/17   Alisa Graff, FNP  prochlorperazine (COMPAZINE) 10 MG tablet Take 1 tablet (10 mg total) by mouth every 8 (eight) hours as needed for nausea or vomiting. 11/09/17   Alisa Graff, FNP  Pyridoxine HCl (VITAMIN B6) 200 MG TABS Take 200 tablets by mouth 2 (two) times daily.  11/16/13   [provider]  senna-docusate (SENOKOT-S) 8.6-50 MG tablet Take 1 tablet by mouth daily. 06/16/17   Jerrol Banana., MD  torsemide (DEMADEX) 20 MG tablet Take 2 tablets (40 mg total) by mouth 2 (two) times daily. 01/11/18   Alisa Graff, FNP  warfarin (COUMADIN) 2 MG tablet Take 2 mg by mouth every other day. Take 2mg  one day and take 2.5mg  next day, alternating.    [provider]    Family History Family History  Problem Relation Age of Onset  . Hypertension Mother   . Heart disease Mother   . CVA Mother   . Heart attack Mother   . Lung cancer Father   . Heart attack Sister   . CVA Sister   . Thyroid disease Sister   . Liver cancer Brother   . Thyroid disease Sister   . Thyroid disease Sister   . Diabetes Sister   . Dementia Sister   . Atrial fibrillation Sister     Social  History Social History   Tobacco Use  . Smoking status: Never Smoker  . Smokeless tobacco: Never Used  Substance Use Topics  . Alcohol use: No  . Drug use: No     Allergies   Ampicillin; Prinzide  [lisinopril-hydrochlorothiazide]; and Penicillins   Review of Systems Review of Systems  Respiratory: Positive for shortness of breath.   Cardiovascular: Positive for palpitations.  All other systems reviewed and are negative.    Physical Exam Updated Vital  Signs BP 112/85 (BP Location: Right Arm)   Pulse (!) 123   Temp 98.1 F (36.7 C) (Oral)   Resp (!) 22   SpO2 90%   Physical Exam  Constitutional: She is oriented to person, place, and time.  Chronically ill, tachypneic   HENT:  Head: Normocephalic.  Mouth/Throat: Oropharynx is clear and moist.  Eyes: Pupils are equal, round, and reactive to light. EOM are normal.  Neck: Normal range of motion.  Cardiovascular:  Tachycardic, irregular   Pulmonary/Chest:  Tachypneic, no breath sounds on R side, L side breath sounds normal   Abdominal: Soft. Bowel sounds are normal.  Musculoskeletal:  1+ edema bilateral legs   Neurological: She is alert and oriented to person, place, and time.  Skin: Skin is warm. Capillary refill takes less than 2 seconds.  Psychiatric: She has a normal mood and affect. Her behavior is normal.  Nursing note and vitals reviewed.    ED Treatments / Results  Labs (all labs ordered are listed, but only abnormal results are displayed) Labs Reviewed  CBC - Abnormal; Notable for the following components:      Result Value   WBC 13.4 (*)    MCHC 29.7 (*)    RDW 16.3 (*)    All other components within normal limits  BASIC METABOLIC PANEL  TROPONIN I  PROTIME-INR  BRAIN NATRIURETIC PEPTIDE    EKG EKG Interpretation  Date/Time:  Wednesday February 03 2018 16:35:13 EST Ventricular Rate:  123 PR Interval:    QRS Duration: 108 QT Interval:  338 QTC Calculation: 483 R Axis:   151 Text  Interpretation:  Atrial fibrillation with rapid ventricular response with premature ventricular or aberrantly conducted complexes Low voltage QRS Right bundle branch block Septal infarct (cited on or before 27-Jun-2017) Abnormal ECG When compared with ECG of 03-Feb-2018 16:33, (unconfirmed) Atrial fibrillation has replaced Junctional rhythm ST no longer depressed in Anterior leads No significant change since last tracing Confirmed by Wandra Arthurs [phone]) on 02/04/2018 5:05:01 PM   Radiology No results found.  Procedures Procedures (including critical care time)  CRITICAL CARE Performed by: Wandra Arthurs   Total critical care time: 30 minutes  Critical care time was exclusive of separately billable procedures and treating other patients.  Critical care was necessary to treat or prevent imminent or life-threatening deterioration.  Critical care was time spent personally by me on the following activities: development of treatment plan with patient and/or surrogate as well as nursing, discussions with consultants, evaluation of patient's response to treatment, examination of patient, obtaining history from patient or surrogate, ordering and performing treatments and interventions, ordering and review of laboratory studies, ordering and review of radiographic studies, pulse oximetry and re-evaluation of patient's condition.   Medications Ordered in ED Medications  diltiazem (CARDIZEM) injection 10 mg (has no administration in time range)     Initial Impression / Assessment and Plan / ED Course  I have reviewed the triage vital signs and the nursing notes.  Pertinent labs & imaging results that were available during my care of the patient were reviewed by me and considered in my medical decision making (see chart for details).    Hannah Faulkner is a 77 y.o. female here with SOB, palpitations. Patient in rapid afib despite getting metoprolol in the office. Has no breath sounds on the right  side. Consider hemothorax vs worsening CHF. Will get labs, BNP, CT chest.   6:51 PM INR 3.4. BNP elevated at 722. HR still  120s despite being on cardizem drip. CT showed large R pleural effusion and there is soft tissue mass in the liver with ascites so there may been underlying cancer causing pleural effusion and rapid afib likely secondary issue. Placed on cardizem drip, hospitalist to admit for further workup.    Final Clinical Impressions(s) / ED Diagnoses   Final diagnoses:  None    ED Discharge Orders    None       Drenda Freeze, MD 02/08/2018 [phone]

## 2018-02-03 NOTE — ED Notes (Signed)
Patient transported to CT 

## 2018-02-03 NOTE — Progress Notes (Signed)
   [facility] Day: 0 days Hannah Faulkner is a 78 y.o. female presenting with Shortness of Breath and Atrial Fibrillation .   Advance care planning discussed with patient and also her son at bedside.  Patient is alert and oriented and able to participate in the conversation.  All questions in regards to overall condition and expected prognosis answered.  She has clearly indicated that she is agreeable for aggressive care until cardiac arrest happens and would not want any resuscitation if her heart stops.   the decision was made to change current code status to DNR  CODE STATUS: DNR  Time spent: 18 minutes

## 2018-02-04 ENCOUNTER — Inpatient Hospital Stay: Payer: Medicare Other

## 2018-02-04 ENCOUNTER — Inpatient Hospital Stay
Admit: 2018-02-04 | Discharge: 2018-02-04 | Disposition: A | Discharge status: Home / Self Care | Payer: Medicare Other | Attending: Internal Medicine | Admitting: Internal Medicine

## 2018-02-04 LAB — BODY FLUID CELL COUNT WITH DIFFERENTIAL
EOS FL: 0 %
Lymphs, Fluid: 97 %
MONOCYTE-MACROPHAGE-SEROUS FLUID: 2 %
NEUTROPHIL FLUID: 1 %
Total Nucleated Cell Count, Fluid: 327 cu mm

## 2018-02-04 LAB — ECHOCARDIOGRAM COMPLETE: Weight: 2448 oz

## 2018-02-04 LAB — COMPREHENSIVE METABOLIC PANEL
ALBUMIN: 3 g/dL — AB (ref 3.5–5.0)
ALK PHOS: 97 U/L (ref 38–126)
ALT: 13 U/L (ref 0–44)
AST: 30 U/L (ref 15–41)
Anion gap: 10 (ref 5–15)
BUN: 16 mg/dL (ref 8–23)
CALCIUM: 8.6 mg/dL — AB (ref 8.9–10.3)
CO2: 29 mmol/L (ref 22–32)
CREATININE: 0.66 mg/dL (ref 0.44–1.00)
Chloride: 101 mmol/L (ref 98–111)
GFR calc Af Amer: >60 mL/min (ref >60)
GFR calc non Af Amer: >60 mL/min (ref >60)
GLUCOSE: 118 mg/dL — AB (ref 70–99)
Potassium: 4.4 mmol/L (ref 3.5–5.1)
Sodium: 140 mmol/L (ref 135–145)
Total Bilirubin: 1.1 mg/dL (ref 0.3–1.2)
Total Protein: 6.5 g/dL (ref 6.5–8.1)

## 2018-02-04 LAB — LACTATE DEHYDROGENASE, PLEURAL OR PERITONEAL FLUID: LD, Fluid: 60 U/L — ABNORMAL HIGH (ref 3–23)

## 2018-02-04 LAB — PROTIME-INR
INR: 3.63
Prothrombin Time: 35.6 seconds — ABNORMAL HIGH (ref 11.4–15.2)

## 2018-02-04 LAB — ALBUMIN, PLEURAL OR PERITONEAL FLUID: ALBUMIN FL: 2.1 g/dL

## 2018-02-04 LAB — AMYLASE, PLEURAL OR PERITONEAL FLUID: AMYLASE FL: 31 U/L

## 2018-02-04 LAB — GLUCOSE, PLEURAL OR PERITONEAL FLUID: Glucose, Fluid: 126 mg/dL

## 2018-02-04 LAB — PROTEIN, PLEURAL OR PERITONEAL FLUID: TOTAL PROTEIN, FLUID: 3.5 g/dL

## 2018-02-04 MED ORDER — GUAIFENESIN 100 MG/5ML PO SOLN
5.0000 mL | ORAL | Status: DC | PRN
  Administered 2018-02-04 – 2018-02-06 (×3): 100 mg via ORAL
  Filled 2018-02-04 (×4): qty 5

## 2018-02-04 MED ORDER — SODIUM CHLORIDE 0.9% FLUSH
3.0000 mL | Freq: Two times a day (BID) | INTRAVENOUS | Status: DC
  Administered 2018-02-04 – 2018-02-08 (×8): 3 mL via INTRAVENOUS

## 2018-02-04 MED ORDER — GADOBUTROL 1 MMOL/ML IV SOLN
7.0000 mL | Freq: Once | INTRAVENOUS | Status: AC | PRN
  Administered 2018-02-04: 7 mL via INTRAVENOUS

## 2018-02-04 MED ORDER — DOXYCYCLINE HYCLATE 100 MG PO TABS
100.0000 mg | ORAL_TABLET | Freq: Two times a day (BID) | ORAL | Status: DC
  Administered 2018-02-04 – 2018-02-07 (×7): 100 mg via ORAL
  Filled 2018-02-04 (×8): qty 1

## 2018-02-04 MED ORDER — ADULT MULTIVITAMIN W/MINERALS CH
1.0000 | ORAL_TABLET | Freq: Every day | ORAL | Status: DC
  Administered 2018-02-05 – 2018-02-07 (×3): 1 via ORAL
  Filled 2018-02-04 (×3): qty 1

## 2018-02-04 MED ORDER — ENSURE ENLIVE PO LIQD
237.0000 mL | Freq: Two times a day (BID) | ORAL | Status: DC
  Administered 2018-02-05 – 2018-02-08 (×4): 237 mL via ORAL

## 2018-02-04 MED ORDER — FUROSEMIDE 10 MG/ML IJ SOLN
40.0000 mg | Freq: Two times a day (BID) | INTRAMUSCULAR | Status: DC
  Administered 2018-02-04 – 2018-02-05 (×2): 40 mg via INTRAVENOUS
  Filled 2018-02-04 (×2): qty 4

## 2018-02-04 MED ORDER — SODIUM CHLORIDE 0.9 % IV SOLN
1.0000 g | INTRAVENOUS | Status: DC
  Administered 2018-02-04 – 2018-02-07 (×4): 1 g via INTRAVENOUS
  Filled 2018-02-04: qty 1
  Filled 2018-02-04: qty 10
  Filled 2018-02-04: qty 1
  Filled 2018-02-04: qty 10
  Filled 2018-02-04: qty 1

## 2018-02-04 MED ORDER — ALPRAZOLAM 0.5 MG PO TABS
0.5000 mg | ORAL_TABLET | Freq: Every evening | ORAL | Status: DC | PRN
  Administered 2018-02-05 – 2018-02-07 (×3): 0.5 mg via ORAL
  Filled 2018-02-04 (×3): qty 1

## 2018-02-04 MED ORDER — DILTIAZEM HCL 30 MG PO TABS
30.0000 mg | ORAL_TABLET | Freq: Four times a day (QID) | ORAL | Status: DC
  Administered 2018-02-04 – 2018-02-05 (×5): 30 mg via ORAL
  Filled 2018-02-04 (×5): qty 1

## 2018-02-04 NOTE — Progress Notes (Signed)
Patient ID: Jarold Motto Ripp, female   DOB: 13-Jul-1940, 77 y.o.   MRN: [id number]  [employer] PROGRESS NOTE  Tanayia Wahlquist Altmann QDI:[id number] DOB: 1940/08/24 DOA: 02/15/2018 PCP: Jerrol Banana., MD  HPI/Subjective: Called to see patient because she was having a coughing fit.  The coughing has settled down while I was in the room. She just had a thoracentesis that took off 1.3 L.  Still feels short of breath.  Some abdominal swelling.  Objective: Vitals:   02/04/18 1140 02/04/18 1300  BP:  104/72  Pulse:  88  Resp:    Temp:    SpO2: 91% 91%    Filed Weights   02/18/2018 2051 02/04/18 0516  Weight: 67.8 kg 69.4 kg    ROS: Review of Systems  Unable to perform ROS: Acuity of condition  Respiratory: Positive for cough and shortness of breath.   Cardiovascular: Negative for chest pain.  Gastrointestinal: Negative for abdominal pain.   Exam: Physical Exam  Constitutional: She is oriented to person, place, and time.  HENT:  Nose: No mucosal edema.  Mouth/Throat: No oropharyngeal exudate or posterior oropharyngeal edema.  Eyes: Pupils are equal, round, and reactive to light. Conjunctivae, EOM and lids are normal.  Neck: No JVD present. Carotid bruit is not present. No edema present. No thyroid mass and no thyromegaly present.  Cardiovascular: S1 normal and S2 normal. Exam reveals no gallop.  No murmur heard. Pulses:      Dorsalis pedis pulses are 2+ on the right side, and 2+ on the left side.  Respiratory: No respiratory distress. She has decreased breath sounds in the right middle field, the right lower field, the left middle field and the left lower field. She has no wheezes. She has rhonchi in the right lower field. She has rales in the left lower field.  GI: Soft. Bowel sounds are normal. There is no tenderness.  Musculoskeletal:       Right ankle: She exhibits swelling.       Left ankle: She exhibits swelling.  Lymphadenopathy:    She has no cervical adenopathy.   Neurological: She is alert and oriented to person, place, and time. No cranial nerve deficit.  Skin: Skin is warm. No rash noted. Nails show no clubbing.  Psychiatric: She has a normal mood and affect.      Data Reviewed: Basic Metabolic Panel: Recent Labs  Lab 02/04/2018 1656 02/04/18 0558  NA 140 140  K 4.8 4.4  CL 100 101  CO2 32 29  GLUCOSE 128* 118*  BUN 15 16  CREATININE 0.81 0.66  CALCIUM 9.1 8.6*   Liver Function Tests: Recent Labs  Lab 02/12/2018 2101 02/04/18 0558  AST 34 30  ALT 16 13  ALKPHOS 110 97  BILITOT 1.2 1.1  PROT 7.1 6.5  ALBUMIN 3.6 3.0*  CBC: Recent Labs  Lab 02/02/2018 1656  WBC 13.4*  HGB 12.2  HCT 41.1  MCV 88.8  PLT 275   Cardiac Enzymes: Recent Labs  Lab 01/25/2018 1706  TROPONINI <0.03   BNP (last 3 results) Recent Labs    02/17/2018 1656  BNP 722.0*     Recent Results (from the past 240 hour(s))  Body fluid culture     Status: None (Preliminary result)   Collection Time: 02/04/18  9:56 AM  Result Value Ref Range Status   Specimen Description   Final    PLEURAL Performed at [facility], [address], [place], [place] [address]  Special Requests   Final    NONE Performed at [facility], [address] [place], [place] [address]    Gram Stain   Final    WBC PRESENT,BOTH PMN AND MONONUCLEAR NO ORGANISMS SEEN CYTOSPIN SMEAR Performed at [facility], [place] [address] [place], [place] [address]    Culture PENDING  Incomplete   Report Status PENDING  Incomplete     Studies: Dg Chest 1 View  Result Date: 02/04/2018 CLINICAL DATA:  Post thoracentesis on the right side EXAM: CHEST  1 VIEW COMPARISON:  02/11/2018 FINDINGS: Moderate right pleural effusion, decreasing following thoracentesis. No pneumothorax. Diffuse airspace disease throughout the right lung. Diffuse interstitial opacities throughout the lungs bilaterally, likely edema. Mild cardiomegaly. IMPRESSION: Decreasing right effusion  following thoracentesis, now moderate. No pneumothorax. Diffuse interstitial opacities likely reflects interstitial edema. Asymmetric airspace disease throughout the right lung could reflect asymmetric edema or infection. Electronically Signed   By: Rolm Baptise M.D.   On: 02/04/2018 10:05   Dg Chest 2 View  Result Date: 02/19/2018 CLINICAL DATA:  Short of breath.  Exhausted. EXAM: CHEST - 2 VIEW COMPARISON:  06/27/2017 FINDINGS: Large right pleural effusion. Interstitial thickening of the left lung. No significant left pleural effusion. No pneumothorax. Stable cardiomegaly. No acute osseous abnormality. IMPRESSION: Large right pleural effusion. Stable cardiomegaly. Mild left lower lung airspace disease likely reflecting atelectasis. Electronically Signed   By: Kathreen Devoid   On: 02/06/2018 17:35   Ct Chest W Contrast  Result Date: 02/13/2018 CLINICAL DATA:  Increased heart rate, chest pain, shortness of breath EXAM: CT CHEST WITH CONTRAST TECHNIQUE: Multidetector CT imaging of the chest was performed during intravenous contrast administration. CONTRAST:  25mL OMNIPAQUE IOHEXOL 300 MG/ML  SOLN COMPARISON:  08/06/2016, 06/18/2017 FINDINGS: Cardiovascular: No significant vascular findings. Mild cardiomegaly. No pericardial effusion. Mediastinum/Nodes: No enlarged mediastinal, hilar, or axillary lymph nodes. Thyroid gland, trachea, and esophagus demonstrate no significant findings. Lungs/Pleura: Large right pleural effusion. Trace left pleural effusion. Mild left interstitial thickening and patchy areas of ground-glass opacity. Mild ground-glass opacity in the aerated portions of the right lung. No pneumothorax. Upper Abdomen: Ill-defined 5.9 x 9.1 cm nodular soft tissue mass in the left hepatic lobe with small volume perihepatic ascites. Musculoskeletal: No acute osseous abnormality. No aggressive osseous lesion. Degenerative disc disease with disc height loss throughout the thoracolumbar spine. IMPRESSION:  1. Large right pleural effusion. 2. Ill-defined 5.9 x 9.1 cm nodular soft tissue mass in the left hepatic lobe with small volume perihepatic ascites. Finding is concerning for metastatic disease versus hepatocellular carcinoma. Recommend further evaluation with nonemergent MRI of the abdomen without and with intravenous contrast. Electronically Signed   By: Kathreen Devoid   On: 02/20/2018 18:34   US Thoracentesis Asp Pleural Space W/img Guide  Result Date: 02/04/2018 CLINICAL DATA:  Large right pleural effusion. EXAM: ULTRASOUND GUIDED RIGHT THORACENTESIS COMPARISON:  Chest x-ray and CT of the chest on 02/17/2018 PROCEDURE: An ultrasound guided thoracentesis was thoroughly discussed with the patient and questions answered. The benefits, risks, alternatives and complications were also discussed. The patient understands and wishes to proceed with the procedure. Written consent was obtained. Ultrasound was performed to localize and mark an adequate pocket of fluid in the right chest. The area was then prepped and draped in the normal sterile fashion. 1% Lidocaine was used for local anesthesia. Under ultrasound guidance a 6 French Safe-T-Centesis catheter was introduced. Thoracentesis was performed. The catheter was removed and a dressing applied. COMPLICATIONS: None FINDINGS:  A total of approximately 1.3 L of yellowish green fluid was removed. Additional fluid removal was not tolerated due to development of uncontrolled coughing. Ultrasound shows moderate right pleural fluid remaining after thoracentesis. A fluid sample was sent for laboratory analysis. IMPRESSION: Successful ultrasound guided right thoracentesis yielding 1.3 L of pleural fluid. The patient could not tolerate further fluid removal with moderate right pleural fluid remaining upon completion of the procedure. Electronically Signed   By: Aletta Edouard M.D.   On: 02/04/2018 11:26    Scheduled Meds: . diltiazem  30 mg Oral Q6H  . doxycycline  100  mg Oral Q12H  . feeding supplement (ENSURE ENLIVE)  237 mL Oral BID BM  . flecainide  50 mg Oral BID  . furosemide  40 mg Intravenous BID  . gabapentin  100 mg Oral TID  . LORazepam  1 mg Intravenous On Call  . magnesium oxide  400 mg Oral Daily  . metoprolol tartrate  100 mg Oral BID  . [START ON 02/05/2018] multivitamin with minerals  1 tablet Oral Daily  . pantoprazole  40 mg Oral Daily  . potassium chloride SA  40 mEq Oral BID  . pyridOXINE  200 mg Oral BID  . senna-docusate  1 tablet Oral Daily  . cyanocobalamin  1,000 mcg Oral Daily   Continuous Infusions: . cefTRIAXone (ROCEPHIN)  IV 1 g (02/04/18 1259)  . diltiazem (CARDIZEM) infusion Stopped (02/04/18 1245)    Assessment/Plan:  1. Atrial fibrillation with rapid ventricular response.  Taper off Cardizem drip and start on oral Cardizem.  Already on metoprolol and flecainide.  INR supratherapeutic.  Holding Coumadin at this time. 2. Acute diastolic congestive heart failure.  Continue IV Lasix twice daily. 3. Large right pleural effusion.  Thoracentesis removing 1.3 L.  Watch for analysis for fluid. 4. Possibility of pneumonia start Rocephin and doxycycline. 5. Mass in the liver.  MRI ordered.  Tumor markers ordered. 6. Weakness.  Physical therapy evaluation for tomorrow 7. Sleep apnea on CPAP at night 8. Essential hypertension on metoprolol and now Cardizem  Code Status:     Code Status Orders  (From admission, onward)         Start     Ordered   02/02/2018 2115  Do not attempt resuscitation (DNR)  Continuous    Question Answer Comment  In the event of cardiac or respiratory ARREST Do not call a "code blue"   In the event of cardiac or respiratory ARREST Do not perform Intubation, CPR, defibrillation or ACLS   In the event of cardiac or respiratory ARREST Use medication by any route, position, wound care, and other measures to relive pain and suffering. May use oxygen, suction and manual treatment of airway obstruction  as needed for comfort.      01/25/2018 2114        Code Status History    Date Active Date Inactive Code Status Order ID Comments User Context   06/27/2017 1935 06/29/2017 2104 Full Code [id number]  Nicholes Mango, MD Inpatient   06/27/2017 1724 06/27/2017 1935 DNR [id number]  Fritzi Mandes, MD Inpatient   05/21/2017 1636 05/23/2017 1556 Full Code [id number]  Dustin Flock, MD Inpatient   08/26/2016 1411 08/31/2016 1659 Full Code [id number]  Epifanio Lesches, MD ED   08/04/2016 2221 08/14/2016 2040 Full Code [id number]  [place]el[place]yer, [place], DO Inpatient   02/29/2016 2053 03/03/2016 1403 Full Code [id number]  Henreitta Leber, MD Inpatient    Advance Directive Documentation     Most  Recent Value  Type of Advance Directive  Out of facility DNR (pink MOST or yellow form)  Pre-existing out of facility DNR order (yellow form or pink MOST form)  Physician notified to receive inpatient order  "MOST" Form in Place?  -     Family Communication: Family at the bedside Disposition Plan: To be determined  Consultants:  Cardiology  Antibiotics:  Rocephin  Doxycycline  Time spent: 28 minutes  [place]

## 2018-02-04 NOTE — Consult Note (Addendum)
[facility]  Date of admission:  02/18/2018  Inpatient day:  02/04/2018  Consulting physician: Dr. Gladstone Lighter  Reason for Consultation:  Large liver lesion.  Chief Complaint: Hannah Faulkner is a 77 y.o. female admitted through the emergency room with atrial fibrillation, diastolic CHF, and a large liver lesion.  HPI:  The patient describes a 50 pound weight loss over the past 2 years.  She describes her appetite as poor.  She denies any nausea or vomiting.  She was last admitted to [facility] from 06/27/2017 0 06/29/2017 with atrial fibrillation and RVR, supra-therapeutic INR, asymptomatic UTI and recurrent constipation.  Weight was 160 pounds.  She feels like she was at her baseline health until about 1 month ago.  She presented with a 2 week histoy of "feeling miserable" described as fatigue, palpitations, orthopnea, post-tussive emesis.  INR was > 8 2 days ago.  She denies any fevers.  She has chest pain due to reflux.  She is on omeprazole.  She notes upper abdominal pain, predominantly left sided.  She denies any bone or joint pain.  ER evaluation included a hematocrit of 41.1, hemoglobin 12.2, MCV 88.8, platelets 275,000, WBC 13,400.  Creatinine was 0.81.  BNP was 722.  Troponin was < 0.03.  PT was 33.8 with an INR of 3.4.  LFTs were normal.  LDH was 130.  CEA< CA19-9 and AFP are pending.  Chest CT on 01/25/2018 revealed a large right pleural effusion and a 5.9 x 9.1 cm nodular soft tissue mass in the left hepatic lobe with a small volume of perihepatic ascites.  She underwent ultrasound guided thoracentesis today.  1.3 liters of yellow green fluid was removed.    Colonoscopy 03/03/2013 by Dr. Vira Agar revealed 3 polyps (3-6 mm).  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  There was no dysplasia or malignancy.  Mammogram on 07/13/2014 revealed no evidence of malignancy.  Symptomatically, she is fatigued.  She denies any pain.    Her brother had liver  cancer.  Her father had lung cancer.   Past Medical History:  Diagnosis Date  . A-fib ([place])   . CHF (congestive heart failure) ([facility])    diastolic- on 2l home o2  . Hypertension   . Sleep apnea     Past Surgical History:  Procedure Laterality Date  . CARPAL TUNNEL RELEASE Right   . EYE SURGERY    . POLYPECTOMY     colon poly premoved    Family History  Problem Relation Age of Onset  . Hypertension Mother   . Heart disease Mother   . CVA Mother   . Heart attack Mother   . Lung cancer Father   . Heart attack Sister   . CVA Sister   . Thyroid disease Sister   . Liver cancer Brother   . Thyroid disease Sister   . Thyroid disease Sister   . Diabetes Sister   . Dementia Sister   . Atrial fibrillation Sister     Social History:  reports that she has never smoked. She has never used smokeless tobacco. She reports that she does not drink alcohol or use drugs.  The patient lives in [place] with her grandson and his wife.  She is able to take care of her ADLs.  She is alone during the day with her dog.  She is retired.  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  She is accompanied by her grand daughter today.  Allergies:  Allergies  Allergen Reactions  . Ampicillin   . Prinzide  [Lisinopril-Hydrochlorothiazide]   . Penicillins Rash    .Has patient had a PCN reaction causing immediate rash, facial/tongue/throat swelling, SOB or lightheadedness with hypotension: Unknown Has patient had a PCN reaction causing severe rash involving mucus membranes or skin necrosis: No Has patient had a PCN reaction that required hospitalization: No Has patient had a PCN reaction occurring within the last 10 years: No If all of the above answers are "NO", then may proceed with Cephalosporin use.     Medications Prior to Admission  Medication Sig Dispense Refill  . albuterol (PROVENTIL HFA;VENTOLIN HFA) 108 (90 Base) MCG/ACT inhaler Inhale 2 puffs into the lungs every 6 (six) hours as  needed for wheezing or shortness of breath.    . ALPRAZolam (XANAX) 0.5 MG tablet TAKE 1 TABLET BY MOUTH AT BEDTIME AS NEEDED (Patient taking differently: Take 0.5 mg by mouth at bedtime as needed for sleep. ) 30 tablet 3  . cyanocobalamin 1000 MCG tablet Take 1,000 mcg by mouth daily.    Marland Kitchen diltiazem (CARDIZEM CD) 120 MG 24 hr capsule Take 120 mg by mouth 2 (two) times daily.  6  . flecainide (TAMBOCOR) 50 MG tablet Take 50 mg by mouth 2 (two) times daily.     Marland Kitchen gabapentin (NEURONTIN) 100 MG capsule TAKE 1 CAPSULE (100 MG TOTAL) BY MOUTH 3 (THREE) TIMES DAILY. 270 capsule 2  . glycerin adult 2 g suppository Place 1 suppository rectally as needed for constipation. 12 suppository 0  . magnesium oxide (MAG-OX) 400 MG tablet Take 400 mg by mouth daily.    . metolazone (ZAROXOLYN) 2.5 MG tablet Take 1 tablet (2.5 mg total) by mouth once a week. (Patient taking differently: Take 2.5 mg by mouth See admin instructions. Take 1 tablet (2.5MG) by mouth once a week as needed for excessive fluid) 4 tablet 6  . metoprolol tartrate (LOPRESSOR) 100 MG tablet Take 1 tablet (100 mg total) by mouth 2 (two) times daily. 60 tablet 3  . omeprazole (PRILOSEC) 20 MG capsule Take 1 capsule (20 mg total) by mouth at bedtime. 30 capsule 11  . potassium chloride SA (K-DUR,KLOR-CON) 20 MEQ tablet Take 2 tablets (40 mEq total) by mouth 2 (two) times daily. And an additional 67mq ever Wednesday with metolazone (Patient taking differently: Take 40 mEq by mouth See admin instructions. Take 2 tablets (40MEQ) by mouth twice daily - take 1 additional tablet (20MEQ) once weekly if taken metolazone) 124 tablet 5  . prochlorperazine (COMPAZINE) 10 MG tablet Take 1 tablet (10 mg total) by mouth every 8 (eight) hours as needed for nausea or vomiting. 30 tablet 1  . Pyridoxine HCl (VITAMIN B6) 200 MG TABS Take 200 tablets by mouth 2 (two) times daily.     .Marland Kitchensenna-docusate (SENOKOT-S) 8.6-50 MG tablet Take 1 tablet by mouth daily. (Patient  taking differently: Take 1 tablet by mouth at bedtime. ) 30 tablet 12  . torsemide (DEMADEX) 20 MG tablet Take 2 tablets (40 mg total) by mouth 2 (two) times daily. 120 tablet 5  . warfarin (COUMADIN) 2 MG tablet Take 2 mg by mouth every other day. Alternate daily with 2.5MG tablet    . warfarin (COUMADIN) 2.5 MG tablet Take 2.5 mg by mouth every other day. Alternate daily with 2MG tablet  6  . polyethylene glycol powder (GLYCOLAX/MIRALAX) powder Take 17 g by mouth 2 (two) times daily as needed. 3350 g 1    Review of  Systems: GENERAL:  Fatigue.  No fevers, sweats.  Weight loss of 50 pounds in past 2 years. PERFORMANCE STATUS (ECOG):  2 HEENT:  No visual changes, runny nose, sore throat, mouth sores or tenderness. Lungs: No shortness of breath.  Cough with frothy sputum.  No hemoptysis. Cardiac:  Chest pain secondary to reflux.   2 pillow orthopnea.  Atrial fibrillation.  No palpitations or PND. GI:  Poor appetite.  Chest pain secondary to reflux.  No nausea, vomiting, diarrhea, constipation, melena or hematochezia. GU:  No urgency, frequency, dysuria, or hematuria. Musculoskeletal:  No back pain.  No joint pain.  No muscle tenderness. Extremities:  No pain or swelling. Skin:  No rashes or skin changes. Neuro:  No headache, numbness or weakness, balance or coordination issues. Endocrine:  No diabetes, thyroid issues, hot flashes or night sweats. Psych:  No mood changes, depression or anxiety. Pain:  Pain across upper abdomen. Review of systems:  All other systems reviewed and found to be negative.  Physical Exam:  Blood pressure (!) 108/91, pulse (!) 108, temperature 98.5 F (36.9 C), temperature source Oral, resp. rate 19, height 5' 1"  (1.549 m), weight 153 lb (69.4 kg), SpO2 (!) 89 %.  GENERAL:  Fatigued appearing elderly woman sitting lying in bed on her right side on the medical unit in no acute distress. MENTAL STATUS:  Alert and oriented to person, place and time. HEAD:  Short white  styled hair.  Normocephalic, atraumatic, face symmetric, no Cushingoid features. EYES:  Pupils equal round and reactive to light and accomodation.  No conjunctivitis or scleral icterus. ENT:  Custer in place.  Oropharynx clear without lesion.  Tongue normal.  Upper dentures.  Mucous membranes moist.  RESPIRATORY:  Decreased breath sounds RLL.  Otherwise, clear to auscultation without rales, wheezes or rhonchi. CARDIOVASCULAR:  Irregular rhythm without murmur, rub or gallop. BREAST:  Limited (patient laying on right side).  Right breast without masses, skin changes or nipple discharge.  Left breast without masses, skin changes or nipple discharge.  ABDOMEN:  Mildly tender across upper abdomen.  No guarding or rebound tenderness.  Soft, with active bowel sounds, and no splenomegaly.  Liver palpable.  No masses. SKIN:  No rashes, ulcers or lesions. EXTREMITIES: No edema, no skin discoloration or tenderness.  No palpable cords. LYMPH NODES: No palpable cervical, supraclavicular, axillary or inguinal adenopathy  NEUROLOGICAL: Unremarkable. PSYCH:  Appropriate.   Results for orders placed or performed during the hospital encounter of 02/20/2018 (from the past 48 hour(s))  Basic metabolic panel     Status: Abnormal   Collection Time: 02/06/2018  4:56 PM  Result Value Ref Range   Sodium 140 135 - 145 mmol/L   Potassium 4.8 3.5 - 5.1 mmol/L   Chloride 100 98 - 111 mmol/L   CO2 32 22 - 32 mmol/L   Glucose, Bld 128 (H) 70 - 99 mg/dL   BUN 15 8 - 23 mg/dL   Creatinine, Ser 0.81 0.44 - 1.00 mg/dL   Calcium 9.1 8.9 - 10.3 mg/dL   GFR calc non Af Amer >60 >60 mL/min   GFR calc Af Amer >60 >60 mL/min    Comment: (NOTE) The eGFR has been calculated using the CKD EPI equation. This calculation has not been validated in all clinical situations. eGFR's persistently <60 mL/min signify possible Chronic Kidney Disease.    Anion gap 8 5 - 15    Comment: Performed at [facility], [address]  [place], [place] [address]  CBC     Status: Abnormal   Collection Time: 02/14/2018  4:56 PM  Result Value Ref Range   WBC 13.4 (H) 4.0 - 10.5 K/uL   RBC 4.63 3.87 - 5.11 MIL/uL   Hemoglobin 12.2 12.0 - 15.0 g/dL   HCT 41.1 36.0 - 46.0 %   MCV 88.8 80.0 - 100.0 fL   MCH 26.3 26.0 - 34.0 pg   MCHC 29.7 (L) 30.0 - 36.0 g/dL   RDW 16.3 (H) 11.5 - 15.5 %   Platelets 275 150 - 400 K/uL   nRBC 0.0 0.0 - 0.2 %    Comment: Performed at [facility], [address] [place], [place] [address]  Brain natriuretic peptide     Status: Abnormal   Collection Time: 01/22/2018  4:56 PM  Result Value Ref Range   B Natriuretic Peptide 722.0 (H) 0.0 - 100.0 pg/mL    Comment: Performed at [facility], [address] [place], [place] [address]  Troponin I - Add-On to previous collection     Status: None   Collection Time: 02/14/2018  5:06 PM  Result Value Ref Range   Troponin I <0.03 <0.03 ng/mL    Comment: Performed at [facility], [address] [place], [place] [address]  Protime-INR     Status: Abnormal   Collection Time: 01/25/2018  5:36 PM  Result Value Ref Range   Prothrombin Time 33.8 (H) 11.4 - 15.2 seconds   INR 3.40     Comment: Performed at [facility], [address] [place], [place] [address]  Hepatic function panel     Status: Abnormal   Collection Time: 01/23/2018  9:01 PM  Result Value Ref Range   Total Protein 7.1 6.5 - 8.1 g/dL   Albumin 3.6 3.5 - 5.0 g/dL   AST 34 15 - 41 U/L   ALT 16 0 - 44 U/L   Alkaline Phosphatase 110 38 - 126 U/L   Total Bilirubin 1.2 0.3 - 1.2 mg/dL   Bilirubin, Direct 0.3 (H) 0.0 - 0.2 mg/dL   Indirect Bilirubin 0.9 0.3 - 0.9 mg/dL    Comment: Performed at [facility], [address] [place], [place] [address]  Lactate dehydrogenase     Status: None   Collection Time: 02/02/2018  9:01 PM  Result Value Ref Range   LDH 130 98 - 192 U/L    Comment: Performed at [facility], [address]  [place], [place] [address]  Protime-INR     Status: Abnormal   Collection Time: 02/04/18  5:58 AM  Result Value Ref Range   Prothrombin Time 35.6 (H) 11.4 - 15.2 seconds   INR 3.63     Comment: Performed at [facility], [address] [place], [place] [address]  Comprehensive metabolic panel     Status: Abnormal   Collection Time: 02/04/18  5:58 AM  Result Value Ref Range   Sodium 140 135 - 145 mmol/L   Potassium 4.4 3.5 - 5.1 mmol/L   Chloride 101 98 - 111 mmol/L   CO2 29 22 - 32 mmol/L   Glucose, Bld 118 (H) 70 - 99 mg/dL   BUN 16 8 - 23 mg/dL   Creatinine, Ser 0.66 0.44 - 1.00 mg/dL   Calcium 8.6 (L) 8.9 - 10.3 mg/dL   Total Protein 6.5 6.5 - 8.1 g/dL   Albumin 3.0 (L) 3.5 - 5.0 g/dL   AST 30 15 - 41 U/L   ALT 13  0 - 44 U/L   Alkaline Phosphatase 97 38 - 126 U/L   Total Bilirubin 1.1 0.3 - 1.2 mg/dL   GFR calc non Af Amer >60 >60 mL/min   GFR calc Af Amer >60 >60 mL/min    Comment: (NOTE) The eGFR has been calculated using the CKD EPI equation. This calculation has not been validated in all clinical situations. eGFR's persistently <60 mL/min signify possible Chronic Kidney Disease.    Anion gap 10 5 - 15    Comment: Performed at [facility], [place], [place] [address]  Lactate dehydrogenase (pleural or peritoneal fluid)     Status: Abnormal   Collection Time: 02/04/18  9:56 AM  Result Value Ref Range   LD, Fluid 60 (H) 3 - 23 U/L    Comment: (NOTE) Results should be evaluated in conjunction with serum values    Fluid Type-FLDH PLEURAL     Comment: Performed at [facility], [address] [place], [place] [address] CORRECTED ON 11/14 AT 1009: PREVIOUSLY REPORTED AS CYTOPLEU   Body fluid cell count with differential     Status: Abnormal   Collection Time: 02/04/18  9:56 AM  Result Value Ref Range   Fluid Type-FCT PLEURAL     Comment: CORRECTED ON 11/14 AT 1009: PREVIOUSLY REPORTED AS CYTOPLEU   Color, Fluid YELLOW YELLOW    Appearance, Fluid HAZY (A) CLEAR   WBC, Fluid 327 cu mm   Neutrophil Count, Fluid 1 %   Lymphs, Fluid 97 %   Monocyte-Macrophage-Serous Fluid 2 %   Eos, Fluid 0 %    Comment: Performed at [facility], [address], [place], [place] [address]  Body fluid culture     Status: None (Preliminary result)   Collection Time: 02/04/18  9:56 AM  Result Value Ref Range   Specimen Description      PLEURAL Performed at [facility], [address] [place], [place] [address]    Special Requests      NONE Performed at [facility], [address] [place], [place] [address]    Gram Stain      WBC PRESENT,BOTH PMN AND MONONUCLEAR NO ORGANISMS SEEN CYTOSPIN SMEAR Performed at [facility], [address], [place], [place] [address]    Culture PENDING    Report Status PENDING   Amylase, pleural or peritoneal fluid     Status: None   Collection Time: 02/04/18  9:56 AM  Result Value Ref Range   Amylase, Fluid 31 U/L    Comment: NO NORMAL RANGE ESTABLISHED FOR THIS TEST   Fluid Type-FAMY PLEURAL     Comment: Performed at [facility] [place] [facility], [address] [place], [place] [address] CORRECTED ON 11/14 AT 1010: PREVIOUSLY REPORTED AS CYTOPLEU   Albumin, pleural or peritoneal fluid     Status: None   Collection Time: 02/04/18  9:56 AM  Result Value Ref Range   Albumin, Fluid 2.1 g/dL    Comment: (NOTE) No normal range established for this test Results should be evaluated in conjunction with serum values    Fluid Type-FALB PLEURAL     Comment: Performed at [facility], [address] [place],  [address] CORRECTED ON 11/14 AT 1009: PREVIOUSLY REPORTED AS CYTOPLEU   Protein, pleural or peritoneal fluid     Status: None   Collection Time: 02/04/18  9:56 AM  Result Value Ref Range   Total protein, fluid 3.5 g/dL    Comment: (  NOTE) No normal range established for this test Results should be evaluated in conjunction with  serum values    Fluid Type-FTP PLEURAL     Comment: Performed at [facility], [address] [place], [place] [address] CORRECTED ON 11/14 AT 1009: PREVIOUSLY REPORTED AS CYTOPLEU   Glucose, pleural or peritoneal fluid     Status: None   Collection Time: 02/04/18  9:56 AM  Result Value Ref Range   Glucose, Fluid 126 mg/dL    Comment: (NOTE) No normal range established for this test Results should be evaluated in conjunction with serum values    Fluid Type-FGLU PLEURAL     Comment: Performed at [facility], [address] [place],  [address] CORRECTED ON 11/14 AT 1009: PREVIOUSLY REPORTED AS CYTOPLEU    Dg Chest 1 View  Result Date: 02/04/2018 CLINICAL DATA:  Post thoracentesis on the right side EXAM: CHEST  1 VIEW COMPARISON:  02/02/2018 FINDINGS: Moderate right pleural effusion, decreasing following thoracentesis. No pneumothorax. Diffuse airspace disease throughout the right lung. Diffuse interstitial opacities throughout the lungs bilaterally, likely edema. Mild cardiomegaly. IMPRESSION: Decreasing right effusion following thoracentesis, now moderate. No pneumothorax. Diffuse interstitial opacities likely reflects interstitial edema. Asymmetric airspace disease throughout the right lung could reflect asymmetric edema or infection. Electronically Signed   By: Rolm Baptise M.D.   On: 02/04/2018 10:05   Dg Chest 2 View  Result Date: 01/24/2018 CLINICAL DATA:  Short of breath.  Exhausted. EXAM: CHEST - 2 VIEW COMPARISON:  06/27/2017 FINDINGS: Large right pleural effusion. Interstitial thickening of the left lung. No significant left pleural effusion. No pneumothorax. Stable cardiomegaly. No acute osseous abnormality. IMPRESSION: Large right pleural effusion. Stable cardiomegaly. Mild left lower lung airspace disease likely reflecting atelectasis. Electronically Signed   By: Kathreen Devoid   On: 02/14/2018 17:35   Ct Chest W Contrast  Result Date:  02/05/2018 CLINICAL DATA:  Increased heart rate, chest pain, shortness of breath EXAM: CT CHEST WITH CONTRAST TECHNIQUE: Multidetector CT imaging of the chest was performed during intravenous contrast administration. CONTRAST:  72m OMNIPAQUE IOHEXOL 300 MG/ML  SOLN COMPARISON:  08/06/2016, 06/18/2017 FINDINGS: Cardiovascular: No significant vascular findings. Mild cardiomegaly. No pericardial effusion. Mediastinum/Nodes: No enlarged mediastinal, hilar, or axillary lymph nodes. Thyroid gland, trachea, and esophagus demonstrate no significant findings. Lungs/Pleura: Large right pleural effusion. Trace left pleural effusion. Mild left interstitial thickening and patchy areas of ground-glass opacity. Mild ground-glass opacity in the aerated portions of the right lung. No pneumothorax. Upper Abdomen: Ill-defined 5.9 x 9.1 cm nodular soft tissue mass in the left hepatic lobe with small volume perihepatic ascites. Musculoskeletal: No acute osseous abnormality. No aggressive osseous lesion. Degenerative disc disease with disc height loss throughout the thoracolumbar spine. IMPRESSION: 1. Large right pleural effusion. 2. Ill-defined 5.9 x 9.1 cm nodular soft tissue mass in the left hepatic lobe with small volume perihepatic ascites. Finding is concerning for metastatic disease versus hepatocellular carcinoma. Recommend further evaluation with nonemergent MRI of the abdomen without and with intravenous contrast. Electronically Signed   By: HKathreen Devoid  On: 02/04/2018 18:34   UKoreaThoracentesis Asp Pleural Space W/img Guide  Result Date: 02/04/2018 CLINICAL DATA:  Large right pleural effusion. EXAM: ULTRASOUND GUIDED RIGHT THORACENTESIS COMPARISON:  Chest x-ray and CT of the chest on 01/26/2018 PROCEDURE: An ultrasound guided thoracentesis was thoroughly discussed with the patient and questions answered. The benefits, risks, alternatives and complications were also discussed. The patient understands and wishes to  proceed with the  procedure. Written consent was obtained. Ultrasound was performed to localize and mark an adequate pocket of fluid in the right chest. The area was then prepped and draped in the normal sterile fashion. 1% Lidocaine was used for local anesthesia. Under ultrasound guidance a 6 French Safe-T-Centesis catheter was introduced. Thoracentesis was performed. The catheter was removed and a dressing applied. COMPLICATIONS: None FINDINGS: A total of approximately 1.3 L of yellowish green fluid was removed. Additional fluid removal was not tolerated due to development of uncontrolled coughing. Ultrasound shows moderate right pleural fluid remaining after thoracentesis. A fluid sample was sent for laboratory analysis. IMPRESSION: Successful ultrasound guided right thoracentesis yielding 1.3 L of pleural fluid. The patient could not tolerate further fluid removal with moderate right pleural fluid remaining upon completion of the procedure. Electronically Signed   By: Aletta Edouard M.D.   On: 02/04/2018 11:26    Assessment:  The patient is a 77 y.o. woman with atrial fibrillation and diastolic dysfunction who presented with a 2 week history of fatigue, palpitations, and orthopnea.  She has lost 50 pounds in the past 2 years.  Chest CT on 01/29/2018 revealed a large right pleural effusion and a 5.9 x 9.1 cm nodular soft tissue mass in the left hepatic lobe with a small volume of perihepatic ascites.  She underwent 1.4 liter thoracentesis today.  Cytology is pending.  Last colonoscopy was 03/03/2013 and last mammogram was 07/13/2014.  Symptomatically, she is fatigued.  Exam reveals a palpable liver and decreased RLL breath sounds.  Breast exam is unremarkable.  Plan:   1.  Oncology:  Findings are concerning for metastatic disease or hepatocellular carcinoma.  Await liver MRI and AFP.  Check hepatitis B and C serologies, ferritin, iron studies.  Follow-up cytology from thoracentesis.  If diagnosis  unable to be made with above information, discussed consideration of liver biopsy if patient can be off anticoagulation.  Anticipate outpatient PET scan.   Thank you for allowing me to participate in Jazzmine Kleiman 's care.  I will follow her closely with you while hospitalized and after discharge in the outpatient department.   Lequita Asal, MD  02/04/2018, 3:53 PM

## 2018-02-04 NOTE — Consult Note (Signed)
[facility] Cardiology  CARDIOLOGY CONSULT NOTE  Patient ID: Hannah Faulkner MRN: [id number] DOB/AGE: October 13, 1940 77 y.o.  Admit date: 02/18/2018 Referring Physician Dr. Tressia Miners  Primary Physician Dr. Rosanna Randy Primary Cardiologist Dr. Saralyn Pilar  Reason for Consultation Atrial fibrillation with RVR  HPI: Hannah Faulkner is a 77 year old female with a past medical history significant for atrial fibrillation, on warfarin, chronic diastolic congestive heart failure, hypertension, obstructive sleep apnea, type 2 diabetes, and hypocholesterolemia who presented to her cardiologists office on 01/24/2018 with a 3 week history of "not feeling well."  Cardiology evaluation in the office revealed decreased breath sounds on the right, JVD, and accessory respiratory muscle use which warranted further evaluation in the ED.  Workup in the ED revealed BNP of 722, INR at 3.4.  CT chest revealed large right pleural effusion.  Troponin was negative x1.  ECG revealed atrial fibrillation with RVR.   Today, Hannah Faulkner is sitting up in bed, in mild distress.  States that her breathing has slightly improved, on 2 L of oxygen. Had an episode of chest pain last night, but that has since resolved.  Admits to palpitations. Lower extremity swelling present at baseline, worse in the right extremity.  Denies nausea, vomiting, or diarrhea.    Review of systems complete and found to be negative unless listed above     Past Medical History:  Diagnosis Date  . A-fib ([place])   . CHF (congestive heart failure) (HCC)    diastolic- on 2l home o2  . Hypertension   . Sleep apnea     Past Surgical History:  Procedure Laterality Date  . CARPAL TUNNEL RELEASE Right   . EYE SURGERY    . POLYPECTOMY     colon poly premoved    Medications Prior to Admission  Medication Sig Dispense Refill Last Dose  . albuterol (PROVENTIL HFA;VENTOLIN HFA) 108 (90 Base) MCG/ACT inhaler Inhale 2 puffs into the lungs every 6 (six) hours as needed for wheezing or  shortness of breath.   Unknown at PRN  . ALPRAZolam (XANAX) 0.5 MG tablet TAKE 1 TABLET BY MOUTH AT BEDTIME AS NEEDED (Patient taking differently: Take 0.5 mg by mouth at bedtime as needed for sleep. ) 30 tablet 3 Past Month at PRN  . cyanocobalamin 1000 MCG tablet Take 1,000 mcg by mouth daily.   01/30/2018 at 0800  . diltiazem (CARDIZEM CD) 120 MG 24 hr capsule Take 120 mg by mouth 2 (two) times daily.  6 02/07/2018 at 0800  . flecainide (TAMBOCOR) 50 MG tablet Take 50 mg by mouth 2 (two) times daily.    01/30/2018 at 0800  . gabapentin (NEURONTIN) 100 MG capsule TAKE 1 CAPSULE (100 MG TOTAL) BY MOUTH 3 (THREE) TIMES DAILY. 270 capsule 2 01/25/2018 at 0800  . glycerin adult 2 g suppository Place 1 suppository rectally as needed for constipation. 12 suppository 0 Unknown at PRN  . magnesium oxide (MAG-OX) 400 MG tablet Take 400 mg by mouth daily.   01/28/2018 at 0800  . metolazone (ZAROXOLYN) 2.5 MG tablet Take 1 tablet (2.5 mg total) by mouth once a week. (Patient taking differently: Take 2.5 mg by mouth See admin instructions. Take 1 tablet (2.5MG ) by mouth once a week as needed for excessive fluid) 4 tablet 6 Past Week at PRN  . metoprolol tartrate (LOPRESSOR) 100 MG tablet Take 1 tablet (100 mg total) by mouth 2 (two) times daily. 60 tablet 3 02/07/2018 at 0800  . omeprazole (PRILOSEC) 20 MG capsule Take 1  capsule (20 mg total) by mouth at bedtime. 30 capsule 11 02/02/2018 at 2000  . potassium chloride SA (K-DUR,KLOR-CON) 20 MEQ tablet Take 2 tablets (40 mEq total) by mouth 2 (two) times daily. And an additional 73meq ever Wednesday with metolazone (Patient taking differently: Take 40 mEq by mouth See admin instructions. Take 2 tablets (40MEQ) by mouth twice daily - take 1 additional tablet (20MEQ) once weekly if taken metolazone) 124 tablet 5 02/08/2018 at 0800  . prochlorperazine (COMPAZINE) 10 MG tablet Take 1 tablet (10 mg total) by mouth every 8 (eight) hours as needed for nausea or vomiting.  30 tablet 1 Past Month at PRN  . Pyridoxine HCl (VITAMIN B6) 200 MG TABS Take 200 tablets by mouth 2 (two) times daily.    01/23/2018 at 0800  . senna-docusate (SENOKOT-S) 8.6-50 MG tablet Take 1 tablet by mouth daily. (Patient taking differently: Take 1 tablet by mouth at bedtime. ) 30 tablet 12 02/02/2018 at 2000  . torsemide (DEMADEX) 20 MG tablet Take 2 tablets (40 mg total) by mouth 2 (two) times daily. 120 tablet 5 01/28/2018 at 0800  . warfarin (COUMADIN) 2 MG tablet Take 2 mg by mouth every other day. Alternate daily with 2.5MG  tablet   02/01/2018 at 2000  . warfarin (COUMADIN) 2.5 MG tablet Take 2.5 mg by mouth every other day. Alternate daily with 2MG  tablet  6 01/30/2018 at 2000  . polyethylene glycol powder (GLYCOLAX/MIRALAX) powder Take 17 g by mouth 2 (two) times daily as needed. 3350 g 1 Taking   Social History   Socioeconomic History  . Marital status: Widowed    Spouse name: Not on file  . Number of children: 3  . Years of education: HS  . Highest education level: 12th grade  Occupational History    Employer: RETIRED  Social Needs  . Financial resource strain: Not hard at all  . Food insecurity:    Worry: Never true    Inability: Never true  . Transportation needs:    Medical: Yes    Non-medical: Yes  Tobacco Use  . Smoking status: Never Smoker  . Smokeless tobacco: Never Used  Substance and Sexual Activity  . Alcohol use: No  . Drug use: No  . Sexual activity: Not on file  Lifestyle  . Physical activity:    Days per week: 0 days    Minutes per session: 0 min  . Stress: Only a little  Relationships  . Social connections:    Talks on phone: More than three times a week    Gets together: More than three times a week    Attends religious service: More than 4 times per year    Active member of club or organization: No    Attends meetings of clubs or organizations: Never    Relationship status: Widowed  . Intimate partner violence:    Fear of current or ex  partner: Patient refused    Emotionally abused: Patient refused    Physically abused: Patient refused    Forced sexual activity: Patient refused  Other Topics Concern  . Not on file  Social History Narrative   Living at home with her grandson and family.  Ambulates with either a cane or walker at baseline.    Family History  Problem Relation Age of Onset  . Hypertension Mother   . Heart disease Mother   . CVA Mother   . Heart attack Mother   . Lung cancer Father   . Heart attack  Sister   . CVA Sister   . Thyroid disease Sister   . Liver cancer Brother   . Thyroid disease Sister   . Thyroid disease Sister   . Diabetes Sister   . Dementia Sister   . Atrial fibrillation Sister       Review of systems complete and found to be negative unless listed above      PHYSICAL EXAM  General: Well developed, well nourished, in mild distress HEENT:  Normocephalic and atramatic Neck:  No JVD.  Lungs: Decreased breath sounds on right side.  Heart: Irregularly irregular, rate 80-110 . Normal S1 and S2 without gallops or murmurs.  Abdomen: Bowel sounds are positive, abdomen soft and non-tender  Msk:  Back normal.  Normal strength and tone for age. Gait not assessed  Extremities: No clubbing or cyanosis.  1+ pitting edema left lower extremity. 2+ pitting edema right lower extremity  Neuro: Alert and oriented X 3. Psych:  Good affect, responds appropriately  Labs:   Lab Results  Component Value Date   WBC 13.4 (H) 02/07/2018   HGB 12.2 01/22/2018   HCT 41.1 02/06/2018   MCV 88.8 01/23/2018   PLT 275 02/18/2018    Recent Labs  Lab 02/04/18 0558  NA 140  K 4.4  CL 101  CO2 29  BUN 16  CREATININE 0.66  CALCIUM 8.6*  PROT 6.5  BILITOT 1.1  ALKPHOS 97  ALT 13  AST 30  GLUCOSE 118*   Lab Results  Component Value Date   TROPONINI <0.03 01/29/2018    Lab Results  Component Value Date   CHOL 148 11/27/2015   CHOL 164 02/22/2015   CHOL 169 09/13/2013   Lab Results   Component Value Date   HDL 42 11/27/2015   HDL 49 02/22/2015   HDL 46 09/13/2013   Lab Results  Component Value Date   LDLCALC 84 11/27/2015   LDLCALC 93 02/22/2015   LDLCALC 99 09/13/2013   Lab Results  Component Value Date   TRIG 109 11/27/2015   TRIG 112 02/22/2015   TRIG 122 09/13/2013   Lab Results  Component Value Date   CHOLHDL 3.5 11/27/2015   No results found for: LDLDIRECT    Radiology: Dg Chest 2 View  Result Date: 01/26/2018 CLINICAL DATA:  Short of breath.  Exhausted. EXAM: CHEST - 2 VIEW COMPARISON:  06/27/2017 FINDINGS: Large right pleural effusion. Interstitial thickening of the left lung. No significant left pleural effusion. No pneumothorax. Stable cardiomegaly. No acute osseous abnormality. IMPRESSION: Large right pleural effusion. Stable cardiomegaly. Mild left lower lung airspace disease likely reflecting atelectasis. Electronically Signed   By: Kathreen Devoid   On: 02/12/2018 17:35   Ct Chest W Contrast  Result Date: 02/10/2018 CLINICAL DATA:  Increased heart rate, chest pain, shortness of breath EXAM: CT CHEST WITH CONTRAST TECHNIQUE: Multidetector CT imaging of the chest was performed during intravenous contrast administration. CONTRAST:  53mL OMNIPAQUE IOHEXOL 300 MG/ML  SOLN COMPARISON:  08/06/2016, 06/18/2017 FINDINGS: Cardiovascular: No significant vascular findings. Mild cardiomegaly. No pericardial effusion. Mediastinum/Nodes: No enlarged mediastinal, hilar, or axillary lymph nodes. Thyroid gland, trachea, and esophagus demonstrate no significant findings. Lungs/Pleura: Large right pleural effusion. Trace left pleural effusion. Mild left interstitial thickening and patchy areas of ground-glass opacity. Mild ground-glass opacity in the aerated portions of the right lung. No pneumothorax. Upper Abdomen: Ill-defined 5.9 x 9.1 cm nodular soft tissue mass in the left hepatic lobe with small volume perihepatic ascites. Musculoskeletal: No acute osseous  abnormality. No aggressive osseous lesion. Degenerative disc disease with disc height loss throughout the thoracolumbar spine. IMPRESSION: 1. Large right pleural effusion. 2. Ill-defined 5.9 x 9.1 cm nodular soft tissue mass in the left hepatic lobe with small volume perihepatic ascites. Finding is concerning for metastatic disease versus hepatocellular carcinoma. Recommend further evaluation with nonemergent MRI of the abdomen without and with intravenous contrast. Electronically Signed   By: Kathreen Devoid   On: 02/12/2018 18:34    EKG: Atrial fibrillation  ASSESSMENT AND PLAN:  1. Atrial fibrillation with RVR   -Rate is better controlled; ranging from 80-110  -Continue flecainide 50mg  twice daily  -Continue Cardizem drip, metoprolol 100mg  twice daily   -INR supratherapeutic; continue to hold warfarin until pleural tap; will recheck following procedure   -Echo showed EF 50-55%. C/W diastolic chf.  2.  Diastolic CHF   -Continue IV Lasix 40mg  twice daily   -Daily weights and I and Os; sodium restriction recommended  3.  Hypertension   -Continue current home medications  4.  Liver mass   -Oncology consulted 5.  Right pleural effusion   -Thoracocentesis recommended; will recheck INR following procedure  The history, physical exam findings, and plan of care were all discussed with Dr. Bartholome Bill, and all decision making was made in collaboration.   Signed: Avie Arenas PA-C 02/04/2018, 8:06 AM

## 2018-02-04 NOTE — Progress Notes (Signed)
*  PRELIMINARY RESULTS* Echocardiogram 2D Echocardiogram has been performed.  Hannah Faulkner 02/04/2018, 11:37 AM

## 2018-02-04 NOTE — Progress Notes (Signed)
Cardiovascular and Pulmonary Nurse Navigator Note:    Rounded on patient to provide CHF Education.  Patient in bed with HOB elevated - appeared to be sleeping.  Patient's son in recliner sound asleep and snoring.  Patient's grand-daughter lying on long counter in front of windows looking at her laptop/tablet.  Introduced myself to Geographical information systems officer and informed her I would return at a later time to provide education.    Spoke with patient's nurse, Edwin Dada, RN, and informed her patient and son were sleeping.  [name], RN informed this RN that patient has had a rough day coughing up phlegm.    Will return at a later time.    Roanna Epley, RN, BSN, [facility]  Cardiovascular & Pulmonary Nurse Navigator  Direct Line: [phone]  Department Phone #: [phone] Fax: [phone]  Email Address: [email or web address]

## 2018-02-04 NOTE — Progress Notes (Addendum)
Initial Nutrition Assessment  DOCUMENTATION CODES:   Not applicable  INTERVENTION:   Ensure Enlive po BID, each supplement provides 350 kcal and 20 grams of protein  MVI daily  Magic cup TID with meals, each supplement provides 290 kcal and 9 grams of protein  Liberalize diet  NUTRITION DIAGNOSIS:   Increased nutrient needs related to chronic illness(cirrhosis, new liver mass, CHF) as evidenced by increased estimated needs.  GOAL:   Patient will meet greater than or equal to 90% of their needs  MONITOR:   PO intake, Supplement acceptance, Labs, Weight trends, Skin, I & O's  REASON FOR ASSESSMENT:   Consult Assessment of nutrition requirement/status  ASSESSMENT:   77 y.o. female with a known history of persistent DM, atrial fibrillation on Coumadin, diastolic CHF on 2l home oxygen, cirrhosis, hypertension and sleep apnea presents to hospital secondary to worsening malaise, shortness of breath and palpitations. Pt found to have new liver mass, ascites and pleural effusion    Met with pt and family in room today. Pt reports poor appetite and oral intake for 3 weeks pta. Pt reports her appetite continues to be poor today. Pt only ate sips and bites of her lunch today. Pt has been drinking some carnation instant breakfast at home but not regularly. Pt reports she has been avoiding meats and dairy r/t phlegm production. Pt s/p thoracentesis with 1.3L output today. Pt spitting up mucous and coughing during RD visit today; pt reports this started after her thoracentesis. Pt reports a 60-70lb weight loss over the past year. Per chart, pt weighed 176lbs in 01/2017; it appears pt has lost 18lbs(11%) in 10 months; it is difficult to determine true weight changes r/t ascites and pleural effusion. RD will liberalize diet and add supplements to help pt meet her estimated needs. Pt is willing to try Ensure.   Medications reviewed and include: doxycycline, lasix, Mg oxide, protonix, KCl, B6,  senokot, B12, ceftriaxone  Labs reviewed:   NUTRITION - FOCUSED PHYSICAL EXAM:    Most Recent Value  Orbital Region  Mild depletion  Upper Arm Region  No depletion  Thoracic and Lumbar Region  No depletion  Buccal Region  No depletion  Temple Region  No depletion  Clavicle Bone Region  No depletion  Clavicle and Acromion Bone Region  No depletion  Scapular Bone Region  No depletion  Dorsal Hand  No depletion  Patellar Region  Mild depletion  Anterior Thigh Region  Mild depletion  Posterior Calf Region  Mild depletion  Edema (RD Assessment)  Moderate  Hair  Reviewed  Eyes  Reviewed  Mouth  Reviewed  Skin  Reviewed  Nails  Reviewed     Diet Order:   Diet Order            Diet regular Room service appropriate? Yes; Fluid consistency: Thin  Diet effective now             EDUCATION NEEDS:   Education needs have been addressed  Skin:  Skin Assessment: Reviewed RN Assessment  Last BM:  11/13  Height:   Ht Readings from Last 1 Encounters:  02/04/18 '5\' 1"'$  (1.549 m)    Weight:   Wt Readings from Last 1 Encounters:  02/04/18 69.4 kg    Ideal Body Weight:  47.7 kg  BMI:  Body mass index is 28.91 kg/m.  Estimated Nutritional Needs:   Kcal:  1500-1800kcal/day   Protein:  70-83g/day   Fluid:  >1.2L/day   Koleen Distance MS, RD, LDN  Pager #- [phone] Office#- [phone] After Hours Pager: [phone]  

## 2018-02-04 NOTE — Progress Notes (Signed)
Talked to Dr. Leslye Peer about patient's BP of 95/51 and HR 108-115, patient is to received 40 IV lasix and 30 mg cardizem p.o, order ok to give. RN will continue to monitor.

## 2018-02-04 NOTE — Progress Notes (Signed)
Pt refused cpap, unable to tolerate face mask. In the process of getting setup with a home unit through home health company

## 2018-02-04 NOTE — Procedures (Signed)
Interventional Radiology Procedure Note  Procedure: US guided right thoracentesis  Complications: None  Estimated Blood Loss: None  Findings: 1.3 L yellowish fluid removed. Patient could not tolerate more removal due to significant coughing, pain and nausea.  Venetia Night. Kathlene Cote, M.D Pager:  [phone]

## 2018-02-05 ENCOUNTER — Inpatient Hospital Stay: Payer: Medicare Other

## 2018-02-05 LAB — BODY FLUID CELL COUNT WITH DIFFERENTIAL
EOS FL: 0 %
LYMPHS FL: 93 %
MONOCYTE-MACROPHAGE-SEROUS FLUID: 5 %
NEUTROPHIL FLUID: 2 %
Other Cells, Fluid: 0 %
WBC FLUID: 377 uL

## 2018-02-05 LAB — BASIC METABOLIC PANEL
ANION GAP: 10 (ref 5–15)
BUN: 20 mg/dL (ref 8–23)
CO2: 29 mmol/L (ref 22–32)
Calcium: 8.4 mg/dL — ABNORMAL LOW (ref 8.9–10.3)
Chloride: 99 mmol/L (ref 98–111)
Creatinine, Ser: 1.22 mg/dL — ABNORMAL HIGH (ref 0.44–1.00)
GFR calc Af Amer: 48 mL/min — ABNORMAL LOW (ref >60)
GFR, EST NON AFRICAN AMERICAN: 42 mL/min — AB (ref >60)
GLUCOSE: 122 mg/dL — AB (ref 70–99)
POTASSIUM: 4.7 mmol/L (ref 3.5–5.1)
Sodium: 138 mmol/L (ref 135–145)

## 2018-02-05 LAB — GLUCOSE, PLEURAL OR PERITONEAL FLUID: GLUCOSE FL: 138 mg/dL

## 2018-02-05 LAB — IRON AND TIBC
Iron: 27 ug/dL — ABNORMAL LOW (ref 28–170)
Saturation Ratios: 9 % — ABNORMAL LOW (ref 10.4–31.8)
TIBC: 292 ug/dL (ref 250–450)
UIBC: 265 ug/dL

## 2018-02-05 LAB — PH, BODY FLUID: pH, Body Fluid: 7.6

## 2018-02-05 LAB — PROTIME-INR
INR: 3.64
Prothrombin Time: 35.7 seconds — ABNORMAL HIGH (ref 11.4–15.2)

## 2018-02-05 LAB — AFP TUMOR MARKER

## 2018-02-05 LAB — FERRITIN: Ferritin: 60 ng/mL (ref 11–307)

## 2018-02-05 LAB — PROTEIN, PLEURAL OR PERITONEAL FLUID

## 2018-02-05 LAB — CYTOLOGY - NON PAP

## 2018-02-05 LAB — CEA

## 2018-02-05 LAB — CA 19-9 (SERIAL): CA 19-9: 3 U/mL (ref 0–35)

## 2018-02-05 MED ORDER — DILTIAZEM HCL 30 MG PO TABS
60.0000 mg | ORAL_TABLET | Freq: Four times a day (QID) | ORAL | Status: DC
  Administered 2018-02-05 – 2018-02-08 (×9): 60 mg via ORAL
  Filled 2018-02-05 (×10): qty 2

## 2018-02-05 MED ORDER — CALCIUM CARBONATE ANTACID 500 MG PO CHEW: 1.0000 | CHEWABLE_TABLET | Freq: Four times a day (QID) | ORAL | Status: DC | PRN

## 2018-02-05 MED ORDER — FUROSEMIDE 10 MG/ML IJ SOLN
40.0000 mg | Freq: Every day | INTRAMUSCULAR | Status: DC
  Administered 2018-02-06 – 2018-02-08 (×3): 40 mg via INTRAVENOUS
  Filled 2018-02-05 (×3): qty 4

## 2018-02-05 NOTE — Evaluation (Signed)
Physical Therapy Evaluation Patient Details Name: Hannah Faulkner MRN: [id number] DOB: 09-23-1940 Today's Date: 02/05/2018   History of Present Illness  Hannah Faulkner is a 77yo female who comes [facility] on 11/13 with increased malaise, SOB, palpitations, found to be in AF. PMH: AF on coumadin, CHF on 2L/min, HTN, OSA, LEE. Pt also noted to have new fluid accumulation in ABD now s/p paracentesis 1.3L,. Imaging revealing of new liver mass, thought to be consistent with Liver CA, awaiting oncology consult.   Clinical Impression  Pt admitted with above diagnosis. Pt currently with functional limitations due to the deficits listed below (see "PT Problem List"). Upon entry, pt seated EOB eating breakfast, family present. The pt is awake and agreeable to participate. Supervision to minA for transfers and bed mobility, minGuard for AMB in hallway, which is performed on 4L with desaturation noted at termination of 267ft- 88% after 60sec recovery (time required to acquire signal). Gait speed is consistent with prior admission in March 2019 for PNA, and 30% slower than 2017 admission. Pt physically exhausted after 278ft AMB, but continues on to room. Functional mobility assessment demonstrates increased effort/time requirements, poor tolerance, and need for physical assistance, whereas the patient performed these at a higher level of independence PTA. Pt will benefit from skilled PT intervention to increase independence and safety with basic mobility in preparation for discharge to the venue listed below.       Follow Up Recommendations Home health PT    Equipment Recommendations  None recommended by PT    Recommendations for Other Services       Precautions / Restrictions Precautions Precautions: Fall Restrictions Weight Bearing Restrictions: No      Mobility  Bed Mobility Overal bed mobility: Needs Assistance Bed Mobility: Sit to Supine     Supine to sit: Supervision         Transfers Overall transfer level: Needs assistance Equipment used: Rolling walker (2 wheeled) Transfers: Sit to/from Stand Sit to Stand: Supervision            Ambulation/Gait   Gait Distance (Feet): 240 Feet Assistive device: Rolling walker (2 wheeled)   Gait velocity: 0.64m/s (0.9m/s March 2019; 0.53m/s December 2017)    General Gait Details: Safe RW use, maintained 4L throughout, appearing to tolerate 1st 75% well, then quite exhausted thereafter. Standing rest break at halfway point. 60sec resting SpO2 88% on 4L (difficulty with signal).   Stairs            Wheelchair Mobility    Modified Rankin (Stroke Patients Only)       Balance                                             Pertinent Vitals/Pain Pain Assessment: No/denies pain    Home Living Family/patient expects to be discharged to:: Private residence Living Arrangements: Children;Other relatives Available Help at Discharge: Family;Available PRN/intermittently   Home Access: Stairs to enter Entrance Stairs-Rails: Right Entrance Stairs-Number of Steps: 3 Home Layout: Two level;Full bath on main level;Able to live on main level with bedroom/bathroom Home Equipment: Gilford Rile - 2 wheels;Bedside commode;Shower seat;Cane - quad Additional Comments: notes she used to have BSC but lent it to a relative who has not returned it and she is unsure if she will get it back    Prior Function Level of Independence: Independent with assistive device(s)  Comments: Mostly household distances with furniture for stability since admission in December 2017.      Hand Dominance        Extremity/Trunk Assessment   Upper Extremity Assessment Upper Extremity Assessment: Overall WFL for tasks assessed    Lower Extremity Assessment Lower Extremity Assessment: Overall WFL for tasks assessed;Generalized weakness       Communication   Communication: No difficulties  Cognition  Arousal/Alertness: Awake/alert(hypoactive, appearing physically exhausted. ) Behavior During Therapy: WFL for tasks assessed/performed Overall Cognitive Status: Within Functional Limits for tasks assessed                                        General Comments      Exercises     Assessment/Plan    PT Assessment Patient needs continued PT services  PT Problem List Decreased strength;Decreased activity tolerance;Decreased mobility;Cardiopulmonary status limiting activity       PT Treatment Interventions Therapeutic activities;Therapeutic exercise;Patient/family education;Functional mobility training    PT Goals (Current goals can be found in the Care Plan section)  Acute Rehab PT Goals Patient Stated Goal: Feel better, regain strength  PT Goal Formulation: With patient Time For Goal Achievement: 02/19/18 Potential to Achieve Goals: Good    Frequency Min 2X/week   Barriers to discharge Inaccessible home environment limited support during the day.     Co-evaluation               AM-PAC PT "6 Clicks" Daily Activity  Outcome Measure Difficulty turning over in bed (including adjusting bedclothes, sheets and blankets)?: A Lot Difficulty moving from lying on back to sitting on the side of the bed? : A Lot Difficulty sitting down on and standing up from a chair with arms (e.g., wheelchair, bedside commode, etc,.)?: A Lot Help needed moving to and from a bed to chair (including a wheelchair)?: A Little Help needed walking in hospital room?: A Little Help needed climbing 3-5 steps with a railing? : A Lot 6 Click Score: 14    End of Session Equipment Utilized During Treatment: Oxygen;Gait belt Activity Tolerance: Patient limited by fatigue;Patient tolerated treatment well Patient left: in bed;with family/visitor present;with call bell/phone within reach Nurse Communication: Mobility status PT Visit Diagnosis: Muscle weakness (generalized)  (M62.81);Difficulty in walking, not elsewhere classified (R26.2)    Time: 4818-5631 PT Time Calculation (min) (ACUTE ONLY): 30 min   Charges:   PT Evaluation $PT Eval High Complexity: 1 High PT Treatments $Therapeutic Activity: 8-22 mins        11:05 AM, 02/05/18 Etta Grandchild, PT, DPT Physical Therapist - [facility]  [phone] ([place])     Buccola,Allan C 02/05/2018, 11:02 AM

## 2018-02-05 NOTE — Procedures (Signed)
Pre Procedural Dx: Symptomatic Ascites Post Procedural Dx: Same  Successful US guided paracentesis yielding 1.3 L of serous ascitic fluid. Sample sent to laboratory as requested.  EBL: None  Complications: None immediate  Ronny Bacon, MD Pager #: [phone]

## 2018-02-05 NOTE — Care Management Note (Signed)
Case Management Note  Patient Details  Name: Hannah Faulkner MRN: [id number] Date of Birth: [birth year]  Subjective/Objective:     Patient is from home with son and daughter in law.  Admitted with A-Fib; chronically on coumadin.  Chronic O2 at home at 2L and CPAP.    Family aware to bring to hospital prior to discharge.  S/p thoracentesis for 1.3L.  IV lasix daily.  Currently down to U/S.  Family says she is going for a paracentesis today.  She has a scale at home and weighs herself daily.   Has used [facility] in the past and would like to use them again at discharge.  Corene Cornea with [facility] aware and accepted.  Will notify him when patient discharges.  Denies difficulties obtaining medications.  Current with PCP.  Has a cane and a walker at home.                 Action/Plan:   Expected Discharge Date:                  Expected Discharge Plan:  [place]  In-House Referral:     Discharge planning Services  CM Consult  Post Acute Care Choice:  Home Health Choice offered to:  Patient, Adult Children  DME Arranged:    DME Agency:     HH Arranged:  RN, PT, Nurse's Aide [place] Agency:  [place]  Status of Service:  In process, will continue to follow  If discussed at Long Length of Stay Meetings, dates discussed:    Additional Comments:  Elza Rafter, RN 02/05/2018, 3:16 PM

## 2018-02-05 NOTE — Progress Notes (Signed)
  Patient off the floor for test.  Will follow up when available.

## 2018-02-05 NOTE — Plan of Care (Signed)
MRI has been performed. Patient was premedicated with ativan. Patient is still unable to lie on her back, die to respiratory status. Patient is diuresing.

## 2018-02-05 NOTE — Progress Notes (Signed)
Cardiovascular and Pulmonary Nurse Navigator Note:    Rounded on patient to review CHF education with patient and family, as CHF is not a new diagnosis for patient.  Patient is  followed in the [facility].  Patient busy at this time, as patient's assigned nurse was administering medications, and granddaughter, son, and two other men were in the room.    Will round on patient at a later time.    Roanna Epley, RN, BSN, [facility]  Cardiovascular & Pulmonary Nurse Navigator  Direct Line: [phone]  Department Phone #: [phone] Fax: [phone]  Email Address: [email or web address]

## 2018-02-05 NOTE — Progress Notes (Signed)
Patient ID: Hannah Faulkner, female   DOB: 11-10-1940, 77 y.o.   MRN: [id number]  Patient ID: Hannah Faulkner, female   DOB: [birth year], 77 y.o.   MRN: [id number]  [facility] PROGRESS NOTE  Hannah Faulkner [id number]:[id number] DOB: [birth year] DOA: 02/04/2018 PCP: Jerrol Banana., MD  HPI/Subjective: Patient feeling better today than yesterday.  Still with some cough.   Objective: Vitals:   02/05/18 1000 02/05/18 1452  BP:  111/83  Pulse: (!) 120 (!) 106  Resp:  (!) 22  Temp:  (!) 97.5 F (36.4 C)  SpO2: (!) 88% (!) 87%    Filed Weights   02/04/2018 2051 02/04/18 0516 02/05/18 0328  Weight: 67.8 kg 69.4 kg 68.8 kg    ROS: Review of Systems  Constitutional: Negative for chills and fever.  Eyes: Negative for blurred vision.  Respiratory: Positive for cough and shortness of breath.   Cardiovascular: Negative for chest pain.  Gastrointestinal: Negative for abdominal pain, constipation, diarrhea, nausea and vomiting.  Genitourinary: Negative for dysuria.  Musculoskeletal: Negative for joint pain.  Neurological: Negative for dizziness and headaches.   Exam: Physical Exam  Constitutional: She is oriented to person, place, and time.  HENT:  Nose: No mucosal edema.  Mouth/Throat: No oropharyngeal exudate or posterior oropharyngeal edema.  Eyes: Pupils are equal, round, and reactive to light. Conjunctivae, EOM and lids are normal.  Neck: No JVD present. Carotid bruit is not present. No edema present. No thyroid mass and no thyromegaly present.  Cardiovascular: S1 normal and S2 normal. Exam reveals no gallop.  No murmur heard. Pulses:      Dorsalis pedis pulses are 2+ on the right side, and 2+ on the left side.  Respiratory: No respiratory distress. She has decreased breath sounds in the right middle field, the right lower field, the left middle field and the left lower field. She has no wheezes. She has rhonchi in the right lower field. She has rales in the left lower field.   GI: Soft. Bowel sounds are normal. There is no tenderness.  Musculoskeletal:       Right ankle: She exhibits swelling.       Left ankle: She exhibits swelling.  Lymphadenopathy:    She has no cervical adenopathy.  Neurological: She is alert and oriented to person, place, and time. No cranial nerve deficit.  Skin: Skin is warm. No rash noted. Nails show no clubbing.  Psychiatric: She has a normal mood and affect.      Data Reviewed: Basic Metabolic Panel: Recent Labs  Lab 02/01/2018 1656 02/04/18 0558 02/05/18 0320  NA 140 140 138  K 4.8 4.4 4.7  CL 100 101 99  CO2 32 29 29  GLUCOSE 128* 118* 122*  BUN 15 16 20   CREATININE 0.81 0.66 1.22*  CALCIUM 9.1 8.6* 8.4*   Liver Function Tests: Recent Labs  Lab 02/15/2018 2101 02/04/18 0558  AST 34 30  ALT 16 13  ALKPHOS 110 97  BILITOT 1.2 1.1  PROT 7.1 6.5  ALBUMIN 3.6 3.0*  CBC: Recent Labs  Lab 02/02/2018 1656  WBC 13.4*  HGB 12.2  HCT 41.1  MCV 88.8  PLT 275   Cardiac Enzymes: Recent Labs  Lab 02/18/2018 1706  TROPONINI <0.03   BNP (last 3 results) Recent Labs    02/18/2018 1656  BNP 722.0*     Recent Results (from the past 240 hour(s))  Body fluid culture     Status: None (Preliminary result)   Collection  Time: 02/04/18  9:56 AM  Result Value Ref Range Status   Specimen Description   Final    PLEURAL Performed at [facility], [address] [place], [place] [address]    Special Requests   Final    NONE Performed at [facility], [address] [place], [place] [address]    Gram Stain   Final    WBC PRESENT,BOTH PMN AND MONONUCLEAR NO ORGANISMS SEEN CYTOSPIN SMEAR    Culture   Final    NO GROWTH < 24 HOURS Performed at [facility], [place] [address], [place],  [address]    Report Status PENDING  Incomplete     Studies: Dg Chest 1 View  Result Date: 02/04/2018 CLINICAL DATA:  Post thoracentesis on the right side EXAM: CHEST  1 VIEW COMPARISON:  02/17/2018  FINDINGS: Moderate right pleural effusion, decreasing following thoracentesis. No pneumothorax. Diffuse airspace disease throughout the right lung. Diffuse interstitial opacities throughout the lungs bilaterally, likely edema. Mild cardiomegaly. IMPRESSION: Decreasing right effusion following thoracentesis, now moderate. No pneumothorax. Diffuse interstitial opacities likely reflects interstitial edema. Asymmetric airspace disease throughout the right lung could reflect asymmetric edema or infection. Electronically Signed   By: Rolm Baptise M.D.   On: 02/04/2018 10:05   Dg Chest 2 View  Result Date: 02/20/2018 CLINICAL DATA:  Short of breath.  Exhausted. EXAM: CHEST - 2 VIEW COMPARISON:  06/27/2017 FINDINGS: Large right pleural effusion. Interstitial thickening of the left lung. No significant left pleural effusion. No pneumothorax. Stable cardiomegaly. No acute osseous abnormality. IMPRESSION: Large right pleural effusion. Stable cardiomegaly. Mild left lower lung airspace disease likely reflecting atelectasis. Electronically Signed   By: Kathreen Devoid   On: 02/10/2018 17:35   Ct Chest W Contrast  Result Date: 02/17/2018 CLINICAL DATA:  Increased heart rate, chest pain, shortness of breath EXAM: CT CHEST WITH CONTRAST TECHNIQUE: Multidetector CT imaging of the chest was performed during intravenous contrast administration. CONTRAST:  64mL OMNIPAQUE IOHEXOL 300 MG/ML  SOLN COMPARISON:  08/06/2016, 06/18/2017 FINDINGS: Cardiovascular: No significant vascular findings. Mild cardiomegaly. No pericardial effusion. Mediastinum/Nodes: No enlarged mediastinal, hilar, or axillary lymph nodes. Thyroid gland, trachea, and esophagus demonstrate no significant findings. Lungs/Pleura: Large right pleural effusion. Trace left pleural effusion. Mild left interstitial thickening and patchy areas of ground-glass opacity. Mild ground-glass opacity in the aerated portions of the right lung. No pneumothorax. Upper Abdomen:  Ill-defined 5.9 x 9.1 cm nodular soft tissue mass in the left hepatic lobe with small volume perihepatic ascites. Musculoskeletal: No acute osseous abnormality. No aggressive osseous lesion. Degenerative disc disease with disc height loss throughout the thoracolumbar spine. IMPRESSION: 1. Large right pleural effusion. 2. Ill-defined 5.9 x 9.1 cm nodular soft tissue mass in the left hepatic lobe with small volume perihepatic ascites. Finding is concerning for metastatic disease versus hepatocellular carcinoma. Recommend further evaluation with nonemergent MRI of the abdomen without and with intravenous contrast. Electronically Signed   By: Kathreen Devoid   On: 01/31/2018 18:34   Mr Abdomen W Wo Contrast  Result Date: 02/05/2018 CLINICAL DATA:  Evaluate liver mass EXAM: MRI ABDOMEN WITHOUT AND WITH CONTRAST TECHNIQUE: Multiplanar multisequence MR imaging of the abdomen was performed both before and after the administration of intravenous contrast. CONTRAST:  7 cc of Gadavist COMPARISON:  CT AP 08/06/2016 FINDINGS: Lower chest: Large right pleural effusion is again identified. Smaller left effusion noted. Hepatobiliary: Contour of the liver is slightly irregular without definitive nodularity. Dominant mass involving the entirety of the  lateral segment of left lobe of liver and extending into the medial segment is identified measuring 12.4 by 8.6 by 5.9 cm. Additional smaller lesions are identified throughout the right lobe of liver, too numerous to count. There is sludge identified within the gallbladder. Mild diffuse gallbladder wall edema. No biliary ductal dilatation identified. Pancreas: No main duct dilatation or inflammation identified. No focal mass. Spleen:  Spleen is normal in size.  No focal splenic abnormality. Adrenals/Urinary Tract: The adrenal glands appear normal. No kidney mass or hydronephrosis. Stomach/Bowel: Visualized portions within the abdomen are unremarkable. Vascular/Lymphatic: Aortic  atherosclerosis without aneurysm. The IVC and hepatic veins are patent. The main portal vein and right branch appear patent. The left branch of the portal vein is not visualized and may be thrombosed. 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 Large porta hepatic lymph node has a short axis of 1.9 cm, suspicious for metastatic adenopathy. Other:  Moderate volume of ascites identified within the abdomen. Musculoskeletal: No suspicious bone lesions identified. IMPRESSION: 1. Multiple suspicious liver lesions are identified involving both lobes of liver. The dominant lesion involves the entirety of the lateral segment of left lobe and portions of the right lobe. The enhancement characteristics of these lesions are somewhat nonspecific. If there are risk factors for hepatocellular carcinoma findings may represent multifocal Homeland. Alternatively this could represent sequelae metastatic disease. Correlation with patient's risk factors, AFP levels, and tissue sampling if indicated. 2. 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 3. Ascites. Diagnostic paracentesis may be helpful to evaluate for peritoneal spread of disease. 4. Mild nodular contour of the liver may reflect cirrhosis. Again correlate with any risk factors and hepatitis serology may be helpful. Electronically Signed   By: Kerby Moors M.D.   On: 02/05/2018 07:52   US Thoracentesis Asp Pleural Space W/img Guide  Result Date: 02/04/2018 CLINICAL DATA:  Large right pleural effusion. EXAM: ULTRASOUND GUIDED RIGHT THORACENTESIS COMPARISON:  Chest x-ray and CT of the chest on 02/19/2018 PROCEDURE: An ultrasound guided thoracentesis was thoroughly discussed with the patient and questions answered. The benefits, risks, alternatives and complications were also discussed. The patient understands and wishes to proceed with the procedure. Written consent was obtained. Ultrasound was performed to localize and mark  an adequate pocket of fluid in the right chest. The area was then prepped and draped in the normal sterile fashion. 1% Lidocaine was used for local anesthesia. Under ultrasound guidance a 6 French Safe-T-Centesis catheter was introduced. Thoracentesis was performed. The catheter was removed and a dressing applied. COMPLICATIONS: None FINDINGS: A total of approximately 1.3 L of yellowish green fluid was removed. Additional fluid removal was not tolerated due to development of uncontrolled coughing. Ultrasound shows moderate right pleural fluid remaining after thoracentesis. A fluid sample was sent for laboratory analysis. IMPRESSION: Successful ultrasound guided right thoracentesis yielding 1.3 L of pleural fluid. The patient could not tolerate further fluid removal with moderate right pleural fluid remaining upon completion of the procedure. Electronically Signed   By: Aletta Edouard M.D.   On: 02/04/2018 11:26    Scheduled Meds: . diltiazem  60 mg Oral Q6H  . doxycycline  100 mg Oral Q12H  . feeding supplement (ENSURE ENLIVE)  237 mL Oral BID BM  . flecainide  50 mg Oral BID  . [START ON 02/06/2018] furosemide  40 mg Intravenous Daily  . gabapentin  100 mg Oral TID  . magnesium oxide  400 mg Oral Daily  . metoprolol tartrate  100  mg Oral BID  . multivitamin with minerals  1 tablet Oral Daily  . pantoprazole  40 mg Oral Daily  . potassium chloride SA  40 mEq Oral BID  . pyridOXINE  200 mg Oral BID  . senna-docusate  1 tablet Oral Daily  . sodium chloride flush  3 mL Intravenous Q12H  . cyanocobalamin  1,000 mcg Oral Daily   Continuous Infusions: . cefTRIAXone (ROCEPHIN)  IV 1 g (02/05/18 1221)  . diltiazem (CARDIZEM) infusion Stopped (02/04/18 1245)    Assessment/Plan:  1. Atrial fibrillation with rapid ventricular response.  Increase oral Cardizem.  Already on metoprolol and flecainide.  INR supratherapeutic.  Holding Coumadin at this time.  If INR still high tomorrow may end up giving  vitamin K because patient will likely need a liver biopsy 2. Acute diastolic congestive heart failure.  Continue IV Lasix daily 3. Large right pleural effusion.  Thoracentesis removing 1.3 L.  Cytology negative.  Cultures so far negative 4. Possibility of pneumonia start Rocephin and doxycycline. 5. Mass in the liver.  MRI shows multiple masses.  Tumor markers ordered.  Oncology following 6. Cirrhosis and ascites.  Paracentesis today.  Hepatitis profiles pending 7. Weakness.  Physical therapy evaluation appreciated 8. Sleep apnea on CPAP at night 9. Essential hypertension on metoprolol and now Cardizem  Code Status:     Code Status Orders  (From admission, onward)         Start     Ordered   02/04/2018 2115  Do not attempt resuscitation (DNR)  Continuous    Question Answer Comment  In the event of cardiac or respiratory ARREST Do not call a "code blue"   In the event of cardiac or respiratory ARREST Do not perform Intubation, CPR, defibrillation or ACLS   In the event of cardiac or respiratory ARREST Use medication by any route, position, wound care, and other measures to relive pain and suffering. May use oxygen, suction and manual treatment of airway obstruction as needed for comfort.      01/29/2018 2114        Code Status History    Date Active Date Inactive Code Status Order ID Comments User Context   06/27/2017 1935 06/29/2017 2104 Full Code [id number]  Nicholes Mango, MD Inpatient   06/27/2017 1724 06/27/2017 1935 DNR [id number]  Fritzi Mandes, MD Inpatient   05/21/2017 1636 05/23/2017 1556 Full Code [id number]  Dustin Flock, MD Inpatient   08/26/2016 1411 08/31/2016 1659 Full Code [id number]  Epifanio Lesches, MD ED   08/04/2016 2221 08/14/2016 2040 Full Code [id number]  Hugelmeyer, Hays, DO Inpatient   02/29/2016 2053 03/03/2016 1403 Full Code [id number]  Henreitta Leber, MD Inpatient    Advance Directive Documentation     Most Recent Value  Type of Advance Directive  Out of facility DNR  (pink MOST or yellow form)  Pre-existing out of facility DNR order (yellow form or pink MOST form)  Physician notified to receive inpatient order  "MOST" Form in Place?  -     Family Communication: Family at the bedside Disposition Plan: Likely next week home with home health.  Consultants:  Cardiology  Antibiotics:  Rocephin  Doxycycline  Time spent: 27 minutes  [place]

## 2018-02-05 NOTE — Care Management Important Message (Signed)
Copy of signed IM left with patient in room.  

## 2018-02-06 DIAGNOSIS — I5030 Unspecified diastolic (congestive) heart failure: Secondary | ICD-10-CM

## 2018-02-06 DIAGNOSIS — J90 Pleural effusion, not elsewhere classified: Secondary | ICD-10-CM

## 2018-02-06 DIAGNOSIS — K769 Liver disease, unspecified: Secondary | ICD-10-CM

## 2018-02-06 DIAGNOSIS — I4891 Unspecified atrial fibrillation: Secondary | ICD-10-CM

## 2018-02-06 LAB — HEPATITIS C ANTIBODY: HCV Ab: <0.1 s/co ratio (ref 0.0–0.9)

## 2018-02-06 LAB — PROTIME-INR
INR: 2.81
Prothrombin Time: 29.2 seconds — ABNORMAL HIGH (ref 11.4–15.2)

## 2018-02-06 LAB — HEPATITIS B CORE ANTIBODY, TOTAL: Hep B Core Total Ab: NEGATIVE

## 2018-02-06 LAB — PROTEIN, BODY FLUID (OTHER): Total Protein, Body Fluid Other: 3 g/dL

## 2018-02-06 NOTE — Progress Notes (Signed)
[facility] Hematology/Oncology Progress Note  Date of admission: 02/20/2018  Hospital day:  02/06/2018  Chief Complaint: Hannah Faulkner is a 77 y.o. female admitted through the emergency room with atrial fibrillation, diastolic CHF, and a large liver lesion.  Subjective:  No new complaints.  Notes thoracentesis was painful.  Social History: The patient is accompanied by her grand daughter today.  Her son is on speaker phone.  Allergies:  Allergies  Allergen Reactions  . Ampicillin   . Prinzide  [Lisinopril-Hydrochlorothiazide]   . Penicillins Rash    .Has patient had a PCN reaction causing immediate rash, facial/tongue/throat swelling, SOB or lightheadedness with hypotension: Unknown Has patient had a PCN reaction causing severe rash involving mucus membranes or skin necrosis: No Has patient had a PCN reaction that required hospitalization: No Has patient had a PCN reaction occurring within the last 10 years: No If all of the above answers are "NO", then may proceed with Cephalosporin use.     Scheduled Medications: . diltiazem  60 mg Oral Q6H  . doxycycline  100 mg Oral Q12H  . feeding supplement (ENSURE ENLIVE)  237 mL Oral BID BM  . flecainide  50 mg Oral BID  . furosemide  40 mg Intravenous Daily  . gabapentin  100 mg Oral TID  . magnesium oxide  400 mg Oral Daily  . metoprolol tartrate  100 mg Oral BID  . multivitamin with minerals  1 tablet Oral Daily  . pantoprazole  40 mg Oral Daily  . potassium chloride SA  40 mEq Oral BID  . pyridOXINE  200 mg Oral BID  . senna-docusate  1 tablet Oral Daily  . sodium chloride flush  3 mL Intravenous Q12H  . cyanocobalamin  1,000 mcg Oral Daily    Review of Systems: GENERAL:  Chronic fatigue.  Feels "aweful bad".  No fevers, sweats.  Weight loss of 50 pounds in 2 years. PERFORMANCE STATUS (ECOG): 2 HEENT:  No visual changes, runny nose, sore throat, mouth sores or tenderness. Lungs: No shortness of  breath.  Cough.  No hemoptysis. Cardiac:  Atrial fibrillation.  No chest pain, palpitations, orthopnea, or PND. GI:  Reflux.  Poor appetite.  No nausea, vomiting, diarrhea, constipation, melena or hematochezia. GU:  No urgency, frequency, dysuria, or hematuria. Musculoskeletal:  No back pain.  No joint pain.  No muscle tenderness. Extremities:  No pain or swelling. Skin:  No rashes or skin changes. Neuro:  No headache, numbness or weakness, balance or coordination issues. Endocrine:  No diabetes, thyroid issues, hot flashes or night sweats. Psych:  No mood changes, depression or anxiety. Pain:  Upper abdominal discomfort. Review of systems:  All other systems reviewed and found to be negative.  Physical Exam: Blood pressure 101/65, pulse 84, temperature 98 F (36.7 C), temperature source Oral, resp. rate (!) 21, height _0  (1.549 m), weight 149 lb 3.2 oz (67.7 kg), SpO2 90 %.  GENERAL:  Fatigued appearing woman sitting on a bedside chair in no acute distress. MENTAL STATUS:  Alert and oriented to person, place and time. HEAD:  Short white hair.  Normocephalic, atraumatic, face symmetric, no Cushingoid features. EYES:  Pupils equal round and reactive to light and accomodation.  No conjunctivitis or scleral icterus. ENT:  [place] in place.  Oropharynx clear without lesion.  Tongue normal. Mucous membranes dry.  RESPIRATORY:  Clear to auscultation without rales, wheezes or rhonchi. CARDIOVASCULAR:  Regular rate and rhythm without murmur, rub or gallop. ABDOMEN:  Mildly  tender upper abdomen (no change).  Liver palpable.  Active bowel sounds and no splenomegaly.  No masses. SKIN:  No rashes, ulcers or lesions. EXTREMITIES: No edema, no skin discoloration or tenderness.  No palpable cords. NEUROLOGICAL: Unremarkable. PSYCH:  Appropriate.   Results for orders placed or performed during the hospital encounter of 01/24/2018 (from the past 48 hour(s))  Protime-INR     Status: Abnormal   Collection  Time: 02/05/18  3:20 AM  Result Value Ref Range   Prothrombin Time 35.7 (H) 11.4 - 15.2 seconds   INR 3.64     Comment: Performed at [facility], [address] [place], [place] [address]  Hepatitis B core antibody, total     Status: None   Collection Time: 02/05/18  3:20 AM  Result Value Ref Range   Hep B Core Total Ab Negative Negative    Comment: (NOTE) Performed At: [facility] [place], [place] [id number] Rush Farmer MD OM:[id number]   Hepatitis C antibody     Status: None   Collection Time: 02/05/18  3:20 AM  Result Value Ref Range   HCV Ab <0.1 0.0 - 0.9 s/co ratio    Comment: (NOTE)                                  Negative:     < 0.8                             Indeterminate: 0.8 - 0.9                                  Positive:     > 0.9 The CDC recommends that a positive HCV antibody result be followed up with a HCV Nucleic Acid Amplification test (142395). Performed At: [facility] [place], [place] [id number] Rush Farmer MD WY:[id number]   Ferritin     Status: None   Collection Time: 02/05/18  3:20 AM  Result Value Ref Range   Ferritin 60 11 - 307 ng/mL    Comment: Performed at  [facility], [address] [place], [place] [address]  Iron and TIBC     Status: Abnormal   Collection Time: 02/05/18  3:20 AM  Result Value Ref Range   Iron 27 (L) 28 - 170 ug/dL   TIBC 292 250 - 450 ug/dL   Saturation Ratios 9 (L) 10.4 - 31.8 %   UIBC 265 ug/dL    Comment: Performed at [facility], [address] [place], [place] [address]  Basic metabolic panel     Status: Abnormal   Collection Time: 02/05/18  3:20 AM  Result Value Ref Range   Sodium 138 135 - 145 mmol/L   Potassium 4.7 3.5 - 5.1 mmol/L   Chloride 99 98 - 111 mmol/L   CO2 29 22 - 32 mmol/L   Glucose, Bld 122 (H) 70 - 99 mg/dL   BUN 20 8 - 23 mg/dL   Creatinine, Ser 1.22 (H) 0.44 - 1.00 mg/dL   Calcium 8.4 (L) 8.9 - 10.3  mg/dL   GFR calc non Af Amer 42 (L) >60 mL/min   GFR calc Af Amer 48 (L) >60 mL/min    Comment: (NOTE) The eGFR has been calculated using the CKD EPI equation.  This calculation has not been validated in all clinical situations. eGFR's persistently <60 mL/min signify possible Chronic Kidney Disease.    Anion gap 10 5 - 15    Comment: Performed at [facility], [address] [place], [place] [address]  Body fluid cell count with differential     Status: Abnormal   Collection Time: 02/05/18  4:04 PM  Result Value Ref Range   Fluid Type-FCT CYTOPERI    Color, Fluid YELLOW YELLOW   Appearance, Fluid CLOUDY (A) CLEAR   WBC, Fluid 377 cu mm   Neutrophil Count, Fluid 2 %   Lymphs, Fluid 93 %   Monocyte-Macrophage-Serous Fluid 5 %   Eos, Fluid 0 %   Other Cells, Fluid 0 %    Comment: Performed at [facility], [address] [place], [place] [address]  Body fluid culture     Status: None (Preliminary result)   Collection Time: 02/05/18  4:04 PM  Result Value Ref Range   Specimen Description      PERITONEAL Performed at [facility], [address], [place], [place] [address]    Special Requests      NONE Performed at [facility], [place], [place] [address]    Gram Stain NO WBC SEEN NO ORGANISMS SEEN     Culture      NO GROWTH < 24 HOURS Performed at [facility], [place] [address] [place], [place] [address]    Report Status PENDING   Protein, pleural or peritoneal fluid     Status: None   Collection Time: 02/05/18  4:04 PM  Result Value Ref Range   Total protein, fluid <3.0 g/dL    Comment: (NOTE) No normal range established for this test Results should be evaluated in conjunction with serum values    Fluid Type-FTP CYTOPERI     Comment: Performed at [facility], [address] [place], [place] [address]  Glucose, pleural or peritoneal fluid     Status: None   Collection Time: 02/05/18  4:04 PM   Result Value Ref Range   Glucose, Fluid 138 mg/dL    Comment: (NOTE) No normal range established for this test Results should be evaluated in conjunction with serum values    Fluid Type-FGLU CYTOPERI     Comment: Performed at [facility], [address] [place], [place] [address]  Protein, body fluid (other)     Status: None   Collection Time: 02/05/18  4:04 PM  Result Value Ref Range   Total Protein, Body Fluid Other 3.0 g/dL    Comment: (NOTE) ________________________________________________________ :  Peritoneal  :       Pleural          :   Synovial     : :______________:________________________:________________: :              : Transudate :  Exudate  :                : :______________:____________:___________:________________: :  Not Estab.  :   <3 g/dL  :  >3 g/dL  :    <2.5 g/dL   : :______________:____________:___________:________________: The method performance specifications have not been established for this test in body fluid. The test result should be integrated into the clinical context for interpretation. The method performance specifications have not been established for this test in body fluid.  The test result should be integrated into the clinical context for interpretation. Performed  At: [facility] [place], [place] [id number] Rush Farmer MD OZ:[id number]    Source of Sample PERITONEAL     Comment: Performed at [facility], [address] [place], [place] [address]  Protime-INR     Status: Abnormal   Collection Time: 02/06/18  7:34 AM  Result Value Ref Range   Prothrombin Time 29.2 (H) 11.4 - 15.2 seconds   INR 2.81     Comment: Performed at [facility], [address] [place], [place] [address]   Mr Abdomen W Wo Contrast  Result Date: 02/05/2018 CLINICAL DATA:  Evaluate liver mass EXAM: MRI ABDOMEN WITHOUT AND WITH CONTRAST TECHNIQUE: Multiplanar multisequence MR imaging of the abdomen  was performed both before and after the administration of intravenous contrast. CONTRAST:  7 cc of Gadavist COMPARISON:  CT AP 08/06/2016 FINDINGS: Lower chest: Large right pleural effusion is again identified. Smaller left effusion noted. Hepatobiliary: Contour of the liver is slightly irregular without definitive nodularity. Dominant mass involving the entirety of the lateral segment of left lobe of liver and extending into the medial segment is identified measuring 12.4 by 8.6 by 5.9 cm. Additional smaller lesions are identified throughout the right lobe of liver, too numerous to count. There is sludge identified within the gallbladder. Mild diffuse gallbladder wall edema. No biliary ductal dilatation identified. Pancreas: No main duct dilatation or inflammation identified. No focal mass. Spleen:  Spleen is normal in size.  No focal splenic abnormality. Adrenals/Urinary Tract: The adrenal glands appear normal. No kidney mass or hydronephrosis. Stomach/Bowel: Visualized portions within the abdomen are unremarkable. Vascular/Lymphatic: Aortic atherosclerosis without aneurysm. The IVC and hepatic veins are patent. The main portal vein and right branch appear patent. The left branch of the portal vein is not visualized and may be thrombosed. 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 Large porta hepatic lymph node has a short axis of 1.9 cm, suspicious for metastatic adenopathy. Other:  Moderate volume of ascites identified within the abdomen. Musculoskeletal: No suspicious bone lesions identified. IMPRESSION: 1. Multiple suspicious liver lesions are identified involving both lobes of liver. The dominant lesion involves the entirety of the lateral segment of left lobe and portions of the right lobe. The enhancement characteristics of these lesions are somewhat nonspecific. If there are risk factors for hepatocellular carcinoma findings may represent multifocal [place]. Alternatively this could  represent sequelae metastatic disease. Correlation with patient's risk factors, AFP levels, and tissue sampling if indicated. 2. 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 3. Ascites. Diagnostic paracentesis may be helpful to evaluate for peritoneal spread of disease. 4. Mild nodular contour of the liver may reflect cirrhosis. Again correlate with any risk factors and hepatitis serology may be helpful. Electronically Signed   By: Kerby Moors M.D.   On: 02/05/2018 07:52   US Paracentesis  Result Date: 02/05/2018 INDICATION: Concern for hepatocellular carcinoma, now with symptomatic ascites. Please perform ultrasound-guided paracentesis for diagnostic and therapeutic purposes. EXAM: ULTRASOUND-GUIDED PARACENTESIS COMPARISON:  Abdominal MRI - 02/04/2018 MEDICATIONS: None. COMPLICATIONS: None immediate. TECHNIQUE: Informed written consent was obtained from the patient after a discussion of the risks, benefits and alternatives to treatment. A timeout was performed prior to the initiation of the procedure. Initial ultrasound scanning demonstrates a moderate amount of ascites within the right lower abdominal quadrant. The right lower abdomen was prepped and draped in the usual sterile fashion. 1% lidocaine with epinephrine was used for local anesthesia. An ultrasound image was saved for documentation purposed.  An 8 Fr Safe-T-Centesis catheter was introduced. The paracentesis was performed. The catheter was removed and a dressing was applied. The patient tolerated the procedure well without immediate post procedural complication. FINDINGS: A total of approximately 1.3 liters of serous fluid was removed. Samples were sent to the laboratory as requested by the clinical team. IMPRESSION: Successful ultrasound-guided paracentesis yielding 1.3 liters of peritoneal fluid. Electronically Signed   By: Sandi Mariscal M.D.   On: 02/05/2018 16:48    Assessment:  Hannah Faulkner is a 77 y.o.  female with atrial fibrillation and diastolic dysfunction who presented with a 2 week history of fatigue, palpitations, and orthopnea.  She has lost 50 pounds in the past 2 years.    Chest CT on 01/30/2018 revealed a large right pleural effusion and a 5.9 x 9.1 cm nodular soft tissue mass in the left hepatic lobe with a small volume of perihepatic ascites.  Abdominal MRI on 02/04/2018 revealed multiple suspicious liver lesions involving both lobes.  The dominant lesion involves the entirety of the lateral segment of left lobe and portions of the right lobe. The enhancement characteristics of these lesions are nonspecific. This may reflect multi-focal HCC or metastatic disease.  There are enlarged upper abdominal lymph node compatible with metastatic adenopathy.  She underwent 1.4 liter thoracentesis on 02/04/2018.  Cytology was negative.  She underwent 1.3 liter paracentesis on 02/05/2018.  Pathology is pending.  AFP, CEA, and CA19-9 were normal.  Last colonoscopy was 03/03/2013 and last mammogram was 07/13/2014.  Symptomatically, she feels "aweful".  Exam reveals a palpable liver.  Plan:   1.  Oncology:             Findings remain concerning for metastatic disease or hepatocellular carcinoma.             Liver MRI confirms multiple suspicious lesions.             Common predisposing factors for HCC (hepatitis B, hepatitis C, known cirrhosis, increased iron stores) are negative.             AFP, CEA, and CA19-9 are normal.  Thoracentesis cytology is negative.             Await paracentesis cytology.  If diagnosis unable to be made with above information, discussed consideration of liver biopsy if patient can be off anticoagulation.  Discuss consideration of PET scan in outpatient department with family.  2.  Hematology:  INR has been supra-therapeutic.  INR is 2.81 today.   Patient is considering whether she wishes to pursue liver biopsy if paracentesis does not yield a diagnosis.     Lequita Asal, MD  02/06/2018, 5:23 PM

## 2018-02-06 NOTE — Progress Notes (Signed)
Patient Name: Hannah Faulkner Date of Encounter: 02/06/2018  Hospital Problem List     Active Problems:   A-fib [facility])    Patient Profile     Ms. Hannah Faulkner is a 77 year old female with a past medical history significant for atrial fibrillation, on warfarin, chronic diastolic congestive heart failure, hypertension, obstructive sleep apnea, type 2 diabetes, and hypocholesterolemia admitted with shortness of breath and fatigue as well as weight loss.   She was noted to be in atrial fibrillation with rapid ventricular response.  Chest x-ray showed large right pleural effusion and she underwent thoracentesis.  Chest CT showed a large soft tissue mass in the left hepatic lobe.  She underwent MRI is ultrasound-guided paracentesis.  MRI showed liver lesions with recommendation for consideration for biopsy.  Ultrasound-guided paracentesis was also carried out for diagnostic purposes.  A. fib rate is improved.  Subjective   Weak and fatigued with nausea.  Inpatient Medications    . diltiazem  60 mg Oral Q6H  . doxycycline  100 mg Oral Q12H  . feeding supplement (ENSURE ENLIVE)  237 mL Oral BID BM  . flecainide  50 mg Oral BID  . furosemide  40 mg Intravenous Daily  . gabapentin  100 mg Oral TID  . magnesium oxide  400 mg Oral Daily  . metoprolol tartrate  100 mg Oral BID  . multivitamin with minerals  1 tablet Oral Daily  . pantoprazole  40 mg Oral Daily  . potassium chloride SA  40 mEq Oral BID  . pyridOXINE  200 mg Oral BID  . senna-docusate  1 tablet Oral Daily  . sodium chloride flush  3 mL Intravenous Q12H  . cyanocobalamin  1,000 mcg Oral Daily    Vital Signs    Vitals:   02/06/18 0018 02/06/18 0350 02/06/18 0746 02/06/18 0935  BP: (!) 156/111 100/81 (!) 136/116 92/62  Pulse: (!) 136 (!) 107 92 88  Resp: 18 18 16    Temp:  98 F (36.7 C) (!) 97.3 F (36.3 C)   TempSrc:  Oral Oral   SpO2:  94% 96%   Weight:  67.7 kg    Height:        Intake/Output Summary (Last 24 hours)  at 02/06/2018 1040 Last data filed at 02/06/2018 0426 Gross per 24 hour  Intake 360 ml  Output 450 ml  Net -90 ml   Filed Weights   02/04/18 0516 02/05/18 0328 02/06/18 0350  Weight: 69.4 kg 68.8 kg 67.7 kg    Physical Exam    GEN: Well nourished, well developed, in no acute distress.  HEENT: normal.  Neck: Supple, no JVD, carotid bruits, or masses. Cardiac: Irregular rhythm Respiratory:  Respirations regular and unlabored, clear to auscultation bilaterally. GI: Mild epigastric tenderness. MS: no deformity or atrophy. Skin: warm and dry, no rash. Neuro:  Strength and sensation are intact. Psych: Normal affect.  Labs    CBC Recent Labs    01/24/2018 1656  WBC 13.4*  HGB 12.2  HCT 41.1  MCV 88.8  PLT 413   Basic Metabolic Panel Recent Labs    02/04/18 0558 02/05/18 0320  NA 140 138  K 4.4 4.7  CL 101 99  CO2 29 29  GLUCOSE 118* 122*  BUN 16 20  CREATININE 0.66 1.22*  CALCIUM 8.6* 8.4*   Liver Function Tests Recent Labs    02/06/2018 2101 02/04/18 0558  AST 34 30  ALT 16 13  ALKPHOS 110 97  BILITOT  1.2 1.1  PROT 7.1 6.5  ALBUMIN 3.6 3.0*   No results for input(s): LIPASE, AMYLASE in the last 72 hours. Cardiac Enzymes Recent Labs    01/25/2018 1706  TROPONINI <0.03   BNP Recent Labs    02/06/2018 1656  BNP 722.0*   D-Dimer No results for input(s): DDIMER in the last 72 hours. Hemoglobin A1C No results for input(s): HGBA1C in the last 72 hours. Fasting Lipid Panel No results for input(s): CHOL, HDL, LDLCALC, TRIG, CHOLHDL, LDLDIRECT in the last 72 hours. Thyroid Function Tests No results for input(s): TSH, T4TOTAL, T3FREE, THYROIDAB in the last 72 hours.  Invalid input(s): FREET3  Telemetry    Atrial fibrillation with controlled ventricular response  ECG       Radiology    Dg Chest 1 View  Result Date: 02/04/2018 CLINICAL DATA:  Post thoracentesis on the right side EXAM: CHEST  1 VIEW COMPARISON:  02/20/2018 FINDINGS: Moderate  right pleural effusion, decreasing following thoracentesis. No pneumothorax. Diffuse airspace disease throughout the right lung. Diffuse interstitial opacities throughout the lungs bilaterally, likely edema. Mild cardiomegaly. IMPRESSION: Decreasing right effusion following thoracentesis, now moderate. No pneumothorax. Diffuse interstitial opacities likely reflects interstitial edema. Asymmetric airspace disease throughout the right lung could reflect asymmetric edema or infection. Electronically Signed   By: Rolm Baptise M.D.   On: 02/04/2018 10:05   Dg Chest 2 View  Result Date: 02/08/2018 CLINICAL DATA:  Short of breath.  Exhausted. EXAM: CHEST - 2 VIEW COMPARISON:  06/27/2017 FINDINGS: Large right pleural effusion. Interstitial thickening of the left lung. No significant left pleural effusion. No pneumothorax. Stable cardiomegaly. No acute osseous abnormality. IMPRESSION: Large right pleural effusion. Stable cardiomegaly. Mild left lower lung airspace disease likely reflecting atelectasis. Electronically Signed   By: Kathreen Devoid   On: 02/11/2018 17:35   Ct Chest W Contrast  Result Date: 02/16/2018 CLINICAL DATA:  Increased heart rate, chest pain, shortness of breath EXAM: CT CHEST WITH CONTRAST TECHNIQUE: Multidetector CT imaging of the chest was performed during intravenous contrast administration. CONTRAST:  57mL OMNIPAQUE IOHEXOL 300 MG/ML  SOLN COMPARISON:  08/06/2016, 06/18/2017 FINDINGS: Cardiovascular: No significant vascular findings. Mild cardiomegaly. No pericardial effusion. Mediastinum/Nodes: No enlarged mediastinal, hilar, or axillary lymph nodes. Thyroid gland, trachea, and esophagus demonstrate no significant findings. Lungs/Pleura: Large right pleural effusion. Trace left pleural effusion. Mild left interstitial thickening and patchy areas of ground-glass opacity. Mild ground-glass opacity in the aerated portions of the right lung. No pneumothorax. Upper Abdomen: Ill-defined 5.9 x 9.1  cm nodular soft tissue mass in the left hepatic lobe with small volume perihepatic ascites. Musculoskeletal: No acute osseous abnormality. No aggressive osseous lesion. Degenerative disc disease with disc height loss throughout the thoracolumbar spine. IMPRESSION: 1. Large right pleural effusion. 2. Ill-defined 5.9 x 9.1 cm nodular soft tissue mass in the left hepatic lobe with small volume perihepatic ascites. Finding is concerning for metastatic disease versus hepatocellular carcinoma. Recommend further evaluation with nonemergent MRI of the abdomen without and with intravenous contrast. Electronically Signed   By: Kathreen Devoid   On: 02/19/2018 18:34   Mr Abdomen W Wo Contrast  Result Date: 02/05/2018 CLINICAL DATA:  Evaluate liver mass EXAM: MRI ABDOMEN WITHOUT AND WITH CONTRAST TECHNIQUE: Multiplanar multisequence MR imaging of the abdomen was performed both before and after the administration of intravenous contrast. CONTRAST:  7 cc of Gadavist COMPARISON:  CT AP 08/06/2016 FINDINGS: Lower chest: Large right pleural effusion is again identified. Smaller left effusion noted. Hepatobiliary: Contour of the  liver is slightly irregular without definitive nodularity. Dominant mass involving the entirety of the lateral segment of left lobe of liver and extending into the medial segment is identified measuring 12.4 by 8.6 by 5.9 cm. Additional smaller lesions are identified throughout the right lobe of liver, too numerous to count. There is sludge identified within the gallbladder. Mild diffuse gallbladder wall edema. No biliary ductal dilatation identified. Pancreas: No main duct dilatation or inflammation identified. No focal mass. Spleen:  Spleen is normal in size.  No focal splenic abnormality. Adrenals/Urinary Tract: The adrenal glands appear normal. No kidney mass or hydronephrosis. Stomach/Bowel: Visualized portions within the abdomen are unremarkable. Vascular/Lymphatic: Aortic atherosclerosis without  aneurysm. The IVC and hepatic veins are patent. The main portal vein and right branch appear patent. The left branch of the portal vein is not visualized and may be thrombosed. 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 Large porta hepatic lymph node has a short axis of 1.9 cm, suspicious for metastatic adenopathy. Other:  Moderate volume of ascites identified within the abdomen. Musculoskeletal: No suspicious bone lesions identified. IMPRESSION: 1. Multiple suspicious liver lesions are identified involving both lobes of liver. The dominant lesion involves the entirety of the lateral segment of left lobe and portions of the right lobe. The enhancement characteristics of these lesions are somewhat nonspecific. If there are risk factors for hepatocellular carcinoma findings may represent multifocal [place]. Alternatively this could represent sequelae metastatic disease. Correlation with patient's risk factors, AFP levels, and tissue sampling if indicated. 2. 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 3. Ascites. Diagnostic paracentesis may be helpful to evaluate for peritoneal spread of disease. 4. Mild nodular contour of the liver may reflect cirrhosis. Again correlate with any risk factors and hepatitis serology may be helpful. Electronically Signed   By: Kerby Moors M.D.   On: 02/05/2018 07:52   US Paracentesis  Result Date: 02/05/2018 INDICATION: Concern for hepatocellular carcinoma, now with symptomatic ascites. Please perform ultrasound-guided paracentesis for diagnostic and therapeutic purposes. EXAM: ULTRASOUND-GUIDED PARACENTESIS COMPARISON:  Abdominal MRI - 02/04/2018 MEDICATIONS: None. COMPLICATIONS: None immediate. TECHNIQUE: Informed written consent was obtained from the patient after a discussion of the risks, benefits and alternatives to treatment. A timeout was performed prior to the initiation of the procedure. Initial ultrasound scanning  demonstrates a moderate amount of ascites within the right lower abdominal quadrant. The right lower abdomen was prepped and draped in the usual sterile fashion. 1% lidocaine with epinephrine was used for local anesthesia. An ultrasound image was saved for documentation purposed. An 8 Fr Safe-T-Centesis catheter was introduced. The paracentesis was performed. The catheter was removed and a dressing was applied. The patient tolerated the procedure well without immediate post procedural complication. FINDINGS: A total of approximately 1.3 liters of serous fluid was removed. Samples were sent to the laboratory as requested by the clinical team. IMPRESSION: Successful ultrasound-guided paracentesis yielding 1.3 liters of peritoneal fluid. Electronically Signed   By: Sandi Mariscal M.D.   On: 02/05/2018 16:48   US Thoracentesis Asp Pleural Space W/img Guide  Result Date: 02/04/2018 CLINICAL DATA:  Large right pleural effusion. EXAM: ULTRASOUND GUIDED RIGHT THORACENTESIS COMPARISON:  Chest x-ray and CT of the chest on 01/22/2018 PROCEDURE: An ultrasound guided thoracentesis was thoroughly discussed with the patient and questions answered. The benefits, risks, alternatives and complications were also discussed. The patient understands and wishes to proceed with the procedure. Written consent was obtained. Ultrasound was performed to localize and mark an adequate pocket  of fluid in the right chest. The area was then prepped and draped in the normal sterile fashion. 1% Lidocaine was used for local anesthesia. Under ultrasound guidance a 6 French Safe-T-Centesis catheter was introduced. Thoracentesis was performed. The catheter was removed and a dressing applied. COMPLICATIONS: None FINDINGS: A total of approximately 1.3 L of yellowish green fluid was removed. Additional fluid removal was not tolerated due to development of uncontrolled coughing. Ultrasound shows moderate right pleural fluid remaining after thoracentesis. A  fluid sample was sent for laboratory analysis. IMPRESSION: Successful ultrasound guided right thoracentesis yielding 1.3 L of pleural fluid. The patient could not tolerate further fluid removal with moderate right pleural fluid remaining upon completion of the procedure. Electronically Signed   By: Aletta Edouard M.D.   On: 02/04/2018 11:26    Assessment & Plan    Ms. Tata is a 77 year old female with a past medical history significant for atrial fibrillation, on warfarin, chronic diastolic congestive heart failure, hypertension, obstructive sleep apnea, type 2 diabetes, and hypocholesterolemia who presented to her cardiologists office on 02/08/2018 with a 3 week history of "not feeling well."  Cardiology evaluation in the office revealed decreased breath sounds on the right, JVD, and accessory respiratory muscle use which warranted further evaluation in the ED.  Workup in the ED revealed BNP of 722, INR at 3.4.  CT chest revealed large right pleural effusion.  Troponin was negative x1.  ECG revealed atrial fibrillation with RVR.  Liver lesions-concern over metastatic disease.  Being worked up at present.  She is currently off warfarin and INR this morning was 2.81.  Remain off of warfarin to allow biopsy if necessary or further invasive work-up.  Atrial fibrillation-rate is in better control.  Would continue with flecainide at 50 mg twice daily, diltiazem at 60 mg every 6 hours, metoprolol tartrate 100 mg twice daily.  Would proceed with any further invasive work-up without further cardiac work-up.  Action fraction is normal at 50 to 55%.  Signed, Javier Docker Tesneem Dufrane MD 02/06/2018, 10:40 AM  Pager: [phone]

## 2018-02-06 NOTE — Plan of Care (Signed)
Supplemental oxygen had to be adjusted. Patient is having trouble maintaining saturations above 87%. Patient has been placed on HFNC by respiratory therapy.

## 2018-02-06 NOTE — Progress Notes (Signed)
Patient ID: Hannah Faulkner, female   DOB: 12/18/1940, 77 y.o.   MRN: [id number]  Patient ID: Hannah Faulkner, female   DOB: 10-26-1940, 77 y.o.   MRN: [id number]  [employer] PROGRESS NOTE  Hannah Faulkner [id number] DOB: [birth year] DOA: 02/19/2018 PCP: Jerrol Banana., MD  HPI/Subjective: Seen and evaluated today On oxygen via high flow through nasal cannula Tolerated abdominal paracentesis yesterday No abdominal pain  Objective: Vitals:   02/06/18 0746 02/06/18 0935  BP: (!) 136/116 92/62  Pulse: 92 88  Resp: 16   Temp: (!) 97.3 F (36.3 C)   SpO2: 96%     Filed Weights   02/04/18 0516 02/05/18 0328 02/06/18 0350  Weight: 69.4 kg 68.8 kg 67.7 kg    ROS: Review of Systems  Constitutional: Negative for chills and fever.  Eyes: Negative for blurred vision.  Respiratory: Positive for cough and shortness of breath.   Cardiovascular: Negative for chest pain.  Gastrointestinal: Negative for abdominal pain, constipation, diarrhea, nausea and vomiting.  Genitourinary: Negative for dysuria.  Musculoskeletal: Negative for joint pain.  Neurological: Negative for dizziness and headaches.   Exam: Physical Exam  Constitutional: She is oriented to person, place, and time.  HENT:  Nose: No mucosal edema.  Mouth/Throat: No oropharyngeal exudate or posterior oropharyngeal edema.  Eyes: Pupils are equal, round, and reactive to light. Conjunctivae, EOM and lids are normal.  Neck: No JVD present. Carotid bruit is not present. No edema present. No thyroid mass and no thyromegaly present.  Cardiovascular: S1 normal and S2 normal. Exam reveals no gallop.  No murmur heard. Pulses:      Dorsalis pedis pulses are 2+ on the right side, and 2+ on the left side.  Respiratory: No respiratory distress. She has decreased breath sounds in the right middle field, the right lower field, the left middle field and the left lower field. She has no wheezes. She has rhonchi in the right  lower field. She has rales in the left lower field.  GI: Soft. Bowel sounds are normal. There is no tenderness.  Musculoskeletal:       Right ankle: She exhibits swelling.       Left ankle: She exhibits swelling.  Lymphadenopathy:    She has no cervical adenopathy.  Neurological: She is alert and oriented to person, place, and time. No cranial nerve deficit.  Skin: Skin is warm. No rash noted. Nails show no clubbing.  Psychiatric: She has a normal mood and affect.      Data Reviewed: Basic Metabolic Panel: Recent Labs  Lab 01/22/2018 1656 02/04/18 0558 02/05/18 0320  NA 140 140 138  K 4.8 4.4 4.7  CL 100 101 99  CO2 32 29 29  GLUCOSE 128* 118* 122*  BUN 15 16 20   CREATININE 0.81 0.66 1.22*  CALCIUM 9.1 8.6* 8.4*   Liver Function Tests: Recent Labs  Lab 01/25/2018 2101 02/04/18 0558  AST 34 30  ALT 16 13  ALKPHOS 110 97  BILITOT 1.2 1.1  PROT 7.1 6.5  ALBUMIN 3.6 3.0*  CBC: Recent Labs  Lab 02/04/2018 1656  WBC 13.4*  HGB 12.2  HCT 41.1  MCV 88.8  PLT 275   Cardiac Enzymes: Recent Labs  Lab 02/19/2018 1706  TROPONINI <0.03   BNP (last 3 results) Recent Labs    01/28/2018 1656  BNP 722.0*     Recent Results (from the past 240 hour(s))  Body fluid culture     Status: None (Preliminary result)  Collection Time: 02/04/18  9:56 AM  Result Value Ref Range Status   Specimen Description   Final    PLEURAL Performed at [facility], [address], [place], [place] [address]    Special Requests   Final    NONE Performed at [facility], [address] [place], [place] [address]    Gram Stain   Final    WBC PRESENT,BOTH PMN AND MONONUCLEAR NO ORGANISMS SEEN CYTOSPIN SMEAR    Culture   Final    NO GROWTH 2 DAYS Performed at [facility], [place] [address] [place], [place] [address]    Report Status PENDING  Incomplete  Body fluid culture     Status: None (Preliminary result)   Collection Time: 02/05/18  4:04 PM  Result Value  Ref Range Status   Specimen Description   Final    PERITONEAL Performed at [facility], [address], [place], [place] [address]    Special Requests   Final    NONE Performed at [facility], [place], [place] [address]    Gram Stain NO WBC SEEN NO ORGANISMS SEEN   Final   Culture   Final    NO GROWTH < 24 HOURS Performed at [facility], [place] [address] [place], [place] [address]    Report Status PENDING  Incomplete     Studies: Mr Abdomen W Wo Contrast  Result Date: 02/05/2018 CLINICAL DATA:  Evaluate liver mass EXAM: MRI ABDOMEN WITHOUT AND WITH CONTRAST TECHNIQUE: Multiplanar multisequence MR imaging of the abdomen was performed both before and after the administration of intravenous contrast. CONTRAST:  7 cc of Gadavist COMPARISON:  CT AP 08/06/2016 FINDINGS: Lower chest: Large right pleural effusion is again identified. Smaller left effusion noted. Hepatobiliary: Contour of the liver is slightly irregular without definitive nodularity. Dominant mass involving the entirety of the lateral segment of left lobe of liver and extending into the medial segment is identified measuring 12.4 by 8.6 by 5.9 cm. Additional smaller lesions are identified throughout the right lobe of liver, too numerous to count. There is sludge identified within the gallbladder. Mild diffuse gallbladder wall edema. No biliary ductal dilatation identified. Pancreas: No main duct dilatation or inflammation identified. No focal mass. Spleen:  Spleen is normal in size.  No focal splenic abnormality. Adrenals/Urinary Tract: The adrenal glands appear normal. No kidney mass or hydronephrosis. Stomach/Bowel: Visualized portions within the abdomen are unremarkable. Vascular/Lymphatic: Aortic atherosclerosis without aneurysm. The IVC and hepatic veins are patent. The main portal vein and right branch appear patent. The left branch of the portal vein is not visualized and may be  thrombosed. 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 Large porta hepatic lymph node has a short axis of 1.9 cm, suspicious for metastatic adenopathy. Other:  Moderate volume of ascites identified within the abdomen. Musculoskeletal: No suspicious bone lesions identified. IMPRESSION: 1. Multiple suspicious liver lesions are identified involving both lobes of liver. The dominant lesion involves the entirety of the lateral segment of left lobe and portions of the right lobe. The enhancement characteristics of these lesions are somewhat nonspecific. If there are risk factors for hepatocellular carcinoma findings may represent multifocal [place]. Alternatively this could represent sequelae metastatic disease. Correlation with patient's risk factors, AFP levels, and tissue sampling if indicated. 2. 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 3. Ascites. Diagnostic paracentesis may be helpful to evaluate for peritoneal spread of disease. 4. Mild  nodular contour of the liver may reflect cirrhosis. Again correlate with any risk factors and hepatitis serology may be helpful. Electronically Signed   By: Kerby Moors M.D.   On: 02/05/2018 07:52   US Paracentesis  Result Date: 02/05/2018 INDICATION: Concern for hepatocellular carcinoma, now with symptomatic ascites. Please perform ultrasound-guided paracentesis for diagnostic and therapeutic purposes. EXAM: ULTRASOUND-GUIDED PARACENTESIS COMPARISON:  Abdominal MRI - 02/04/2018 MEDICATIONS: None. COMPLICATIONS: None immediate. TECHNIQUE: Informed written consent was obtained from the patient after a discussion of the risks, benefits and alternatives to treatment. A timeout was performed prior to the initiation of the procedure. Initial ultrasound scanning demonstrates a moderate amount of ascites within the right lower abdominal quadrant. The right lower abdomen was prepped and draped in the usual sterile fashion. 1%  lidocaine with epinephrine was used for local anesthesia. An ultrasound image was saved for documentation purposed. An 8 Fr Safe-T-Centesis catheter was introduced. The paracentesis was performed. The catheter was removed and a dressing was applied. The patient tolerated the procedure well without immediate post procedural complication. FINDINGS: A total of approximately 1.3 liters of serous fluid was removed. Samples were sent to the laboratory as requested by the clinical team. IMPRESSION: Successful ultrasound-guided paracentesis yielding 1.3 liters of peritoneal fluid. Electronically Signed   By: Sandi Mariscal M.D.   On: 02/05/2018 16:48    Scheduled Meds: . diltiazem  60 mg Oral Q6H  . doxycycline  100 mg Oral Q12H  . feeding supplement (ENSURE ENLIVE)  237 mL Oral BID BM  . flecainide  50 mg Oral BID  . furosemide  40 mg Intravenous Daily  . gabapentin  100 mg Oral TID  . magnesium oxide  400 mg Oral Daily  . metoprolol tartrate  100 mg Oral BID  . multivitamin with minerals  1 tablet Oral Daily  . pantoprazole  40 mg Oral Daily  . potassium chloride SA  40 mEq Oral BID  . pyridOXINE  200 mg Oral BID  . senna-docusate  1 tablet Oral Daily  . sodium chloride flush  3 mL Intravenous Q12H  . cyanocobalamin  1,000 mcg Oral Daily   Continuous Infusions: . cefTRIAXone (ROCEPHIN)  IV 1 g (02/05/18 1221)    Assessment/Plan:  1. Atrial fibrillation with rapid ventricular response, status post cardiology follow-up .  Continue Cardizem, flecainide and metoprolol for rate control.  Coumadin on hold.  INR coming down. 2. Acute diastolic congestive heart failure.  Continue IV Lasix daily 3. Large right pleural effusion.  S/p Thoracentesis removing 1.3 L.  Cytology negative.  Cultures so far negative 4. Possibility of pneumonia empirically on Rocephin and doxycycline. 5. Liver mass.  MRI shows multiple masses.  Tumor markers ordered.  Oncology following.  Liver biopsy if okay with oncology.   Status post paracentesis follow-up cytology results 6. Cirrhosis and ascites.  Paracentesis done yesterday 7. Weakness.  Physical therapy evaluation appreciated 8. Sleep apnea on CPAP at night 9. Essential hypertension on metoprolol and now Cardizem 10. Long-term prognosis poor discussed medical condition treatment plan with patient's family in detail  Code Status: DNR    Code Status Orders  (From admission, onward)         Start     Ordered   02/06/2018 2115  Do not attempt resuscitation (DNR)  Continuous    Question Answer Comment  In the event of cardiac or respiratory ARREST Do not call a "code blue"   In the event of cardiac or respiratory ARREST Do not perform  Intubation, CPR, defibrillation or ACLS   In the event of cardiac or respiratory ARREST Use medication by any route, position, wound care, and other measures to relive pain and suffering. May use oxygen, suction and manual treatment of airway obstruction as needed for comfort.      02/12/2018 2114        Code Status History    Date Active Date Inactive Code Status Order ID Comments User Context   06/27/2017 1935 06/29/2017 2104 Full Code [id number]  Nicholes Mango, MD Inpatient   06/27/2017 1724 06/27/2017 1935 DNR [id number]  Fritzi Mandes, MD Inpatient   05/21/2017 1636 05/23/2017 1556 Full Code [id number]  Dustin Flock, MD Inpatient   08/26/2016 1411 08/31/2016 1659 Full Code [id number]  Epifanio Lesches, MD ED   08/04/2016 2221 08/14/2016 2040 Full Code [id number]  Hugelmeyer, [place], DO Inpatient   02/29/2016 2053 03/03/2016 1403 Full Code [id number]  Henreitta Leber, MD Inpatient    Advance Directive Documentation     Most Recent Value  Type of Advance Directive  Out of facility DNR (pink MOST or yellow form)  Pre-existing out of facility DNR order (yellow form or pink MOST form)  Physician notified to receive inpatient order  "MOST" Form in Place?  -     Family Communication: Family at the bedside Disposition Plan: Likely next  week home with home health.  Consultants:  Cardiology  Antibiotics:  Rocephin  Doxycycline  Time spent: 32  minutes  [place]

## 2018-02-07 ENCOUNTER — Encounter: Payer: Self-pay | Admitting: *Deleted

## 2018-02-07 DIAGNOSIS — J9601 Acute respiratory failure with hypoxia: Secondary | ICD-10-CM

## 2018-02-07 LAB — BASIC METABOLIC PANEL
Anion gap: 8 (ref 5–15)
BUN: 34 mg/dL — AB (ref 8–23)
CHLORIDE: 98 mmol/L (ref 98–111)
CO2: 30 mmol/L (ref 22–32)
CREATININE: 1.32 mg/dL — AB (ref 0.44–1.00)
Calcium: 8.7 mg/dL — ABNORMAL LOW (ref 8.9–10.3)
GFR calc Af Amer: 44 mL/min — ABNORMAL LOW (ref >60)
GFR calc non Af Amer: 38 mL/min — ABNORMAL LOW (ref >60)
GLUCOSE: 131 mg/dL — AB (ref 70–99)
POTASSIUM: 5.8 mmol/L — AB (ref 3.5–5.1)
SODIUM: 136 mmol/L (ref 135–145)

## 2018-02-07 LAB — CBC
HCT: 45.9 % (ref 36.0–46.0)
HEMOGLOBIN: 13.9 g/dL (ref 12.0–15.0)
MCH: 26.9 pg (ref 26.0–34.0)
MCHC: 30.3 g/dL (ref 30.0–36.0)
MCV: 88.8 fL (ref 80.0–100.0)
PLATELETS: 349 10*3/uL (ref 150–400)
RBC: 5.17 MIL/uL — ABNORMAL HIGH (ref 3.87–5.11)
RDW: 16.2 % — ABNORMAL HIGH (ref 11.5–15.5)
WBC: 13.9 10*3/uL — AB (ref 4.0–10.5)
nRBC: 0 % (ref 0.0–0.2)

## 2018-02-07 LAB — BODY FLUID CULTURE: CULTURE: NO GROWTH

## 2018-02-07 LAB — PROTIME-INR
INR: 2.23
INR: 1.99
PROTHROMBIN TIME: 24.4 s — AB (ref 11.4–15.2)
PROTHROMBIN TIME: 22.3 s — AB (ref 11.4–15.2)

## 2018-02-07 MED ORDER — BUDESONIDE 0.5 MG/2ML IN SUSP
0.5000 mg | Freq: Two times a day (BID) | RESPIRATORY_TRACT | Status: DC
  Administered 2018-02-07 – 2018-02-08 (×3): 0.5 mg via RESPIRATORY_TRACT
  Filled 2018-02-07 (×4): qty 2

## 2018-02-07 MED ORDER — IPRATROPIUM-ALBUTEROL 0.5-2.5 (3) MG/3ML IN SOLN
3.0000 mL | RESPIRATORY_TRACT | Status: DC
  Administered 2018-02-07 – 2018-02-09 (×8): 3 mL via RESPIRATORY_TRACT
  Filled 2018-02-07 (×8): qty 3

## 2018-02-07 MED ORDER — METHYLPREDNISOLONE SODIUM SUCC 40 MG IJ SOLR
40.0000 mg | Freq: Two times a day (BID) | INTRAMUSCULAR | Status: DC
  Administered 2018-02-07 – 2018-02-08 (×2): 40 mg via INTRAVENOUS
  Filled 2018-02-07: qty 1

## 2018-02-07 NOTE — Progress Notes (Signed)
Patient seen for svn treatment. Patient visiting with family in no distress. Tolerating HFNC well. Patient states she did not want to wear cpap tonight before I could even introduce myself as the respiratory therapist.  I think I understood her to say she had been tested for a cpap.  She states her anxiety and claustrophobia is so bad, she wants to stay on the high flow. There were multiple family members in the room so I address issue again later.

## 2018-02-07 NOTE — Progress Notes (Addendum)
Patient ID: Jarold Motto Jarnagin, female   DOB: 1940-12-14, 77 y.o.   MRN: [id number]  Patient ID: Maelynn Moroney Marvin, female   DOB: 05/17/1940, 77 y.o.   MRN: [id number]  [employer] PROGRESS NOTE  Tamura Lasky Langham [id number] DOB: 10-26-1940 DOA: 02/16/2018 PCP: Jerrol Banana., MD  HPI/Subjective: Seen and evaluated today On oxygen via high flow through nasal cannula almost at 10 L Has generalized weakness No abdominal pain  Objective: Vitals:   02/07/18 0612 02/07/18 0753  BP: 107/75 (!) 105/52  Pulse: 83 76  Resp:  19  Temp:  97.8 F (36.6 C)  SpO2:  92%    Filed Weights   02/05/18 0328 02/06/18 0350 02/07/18 0612  Weight: 68.8 kg 67.7 kg 67.2 kg    ROS: Review of Systems  Constitutional: Negative for chills and fever.  Eyes: Negative for blurred vision.  Respiratory: Positive for cough and shortness of breath.   Cardiovascular: Negative for chest pain.  Gastrointestinal: Negative for abdominal pain, constipation, diarrhea, nausea and vomiting.  Genitourinary: Negative for dysuria.  Musculoskeletal: Negative for joint pain.  Neurological: Negative for dizziness and headaches.   Exam: Physical Exam  Constitutional: She is oriented to person, place, and time.  HENT:  Nose: No mucosal edema.  Mouth/Throat: No oropharyngeal exudate or posterior oropharyngeal edema.  Eyes: Pupils are equal, round, and reactive to light. Conjunctivae, EOM and lids are normal.  Neck: No JVD present. Carotid bruit is not present. No edema present. No thyroid mass and no thyromegaly present.  Cardiovascular: S1 normal and S2 normal. Exam reveals no gallop.  No murmur heard. Pulses:      Dorsalis pedis pulses are 2+ on the right side, and 2+ on the left side.  Respiratory: No respiratory distress. She has decreased breath sounds in the right middle field, the right lower field, the left middle field and the left lower field. She has no wheezes. She has rhonchi in the right lower  field. She has rales in the left lower field.  GI: Soft. Bowel sounds are normal. There is no tenderness.  Musculoskeletal:       Right ankle: She exhibits swelling.       Left ankle: She exhibits swelling.  Lymphadenopathy:    She has no cervical adenopathy.  Neurological: She is alert and oriented to person, place, and time. No cranial nerve deficit.  Skin: Skin is warm. No rash noted. Nails show no clubbing.  Psychiatric: She has a normal mood and affect.      Data Reviewed: Basic Metabolic Panel: Recent Labs  Lab 01/29/2018 1656 02/04/18 0558 02/05/18 0320 02/07/18 0434  NA 140 140 138 136  K 4.8 4.4 4.7 5.8*  CL 100 101 99 98  CO2 32 29 29 30   GLUCOSE 128* 118* 122* 131*  BUN 15 16 20  34*  CREATININE 0.81 0.66 1.22* 1.32*  CALCIUM 9.1 8.6* 8.4* 8.7*   Liver Function Tests: Recent Labs  Lab 01/24/2018 2101 02/04/18 0558  AST 34 30  ALT 16 13  ALKPHOS 110 97  BILITOT 1.2 1.1  PROT 7.1 6.5  ALBUMIN 3.6 3.0*  CBC: Recent Labs  Lab 01/27/2018 1656 02/07/18 0434  WBC 13.4* 13.9*  HGB 12.2 13.9  HCT 41.1 45.9  MCV 88.8 88.8  PLT 275 349   Cardiac Enzymes: Recent Labs  Lab 02/17/2018 1706  TROPONINI <0.03   BNP (last 3 results) Recent Labs    02/15/2018 1656  BNP 722.0*  Recent Results (from the past 240 hour(s))  Body fluid culture     Status: None   Collection Time: 02/04/18  9:56 AM  Result Value Ref Range Status   Specimen Description   Final    PLEURAL Performed at [facility], [address], [place], [place] [address]    Special Requests   Final    NONE Performed at [facility], [address] [place], [place] [address]    Gram Stain   Final    WBC PRESENT,BOTH PMN AND MONONUCLEAR NO ORGANISMS SEEN CYTOSPIN SMEAR    Culture   Final    NO GROWTH Performed at [facility], [place] [address] [place], [place] [address]    Report Status 02/07/2018 FINAL  Final  Body fluid culture     Status: None (Preliminary  result)   Collection Time: 02/05/18  4:04 PM  Result Value Ref Range Status   Specimen Description   Final    PERITONEAL Performed at [facility], [address] [place], [place] [address]    Special Requests   Final    NONE Performed at [facility], [place], [place] [address]    Gram Stain NO WBC SEEN NO ORGANISMS SEEN   Final   Culture   Final    NO GROWTH 2 DAYS Performed at [facility], [place] [address], [place], [place] [address]    Report Status PENDING  Incomplete     Studies: US Paracentesis  Result Date: 02/05/2018 INDICATION: Concern for hepatocellular carcinoma, now with symptomatic ascites. Please perform ultrasound-guided paracentesis for diagnostic and therapeutic purposes. EXAM: ULTRASOUND-GUIDED PARACENTESIS COMPARISON:  Abdominal MRI - 02/04/2018 MEDICATIONS: None. COMPLICATIONS: None immediate. TECHNIQUE: Informed written consent was obtained from the patient after a discussion of the risks, benefits and alternatives to treatment. A timeout was performed prior to the initiation of the procedure. Initial ultrasound scanning demonstrates a moderate amount of ascites within the right lower abdominal quadrant. The right lower abdomen was prepped and draped in the usual sterile fashion. 1% lidocaine with epinephrine was used for local anesthesia. An ultrasound image was saved for documentation purposed. An 8 Fr Safe-T-Centesis catheter was introduced. The paracentesis was performed. The catheter was removed and a dressing was applied. The patient tolerated the procedure well without immediate post procedural complication. FINDINGS: A total of approximately 1.3 liters of serous fluid was removed. Samples were sent to the laboratory as requested by the clinical team. IMPRESSION: Successful ultrasound-guided paracentesis yielding 1.3 liters of peritoneal fluid. Electronically Signed   By: Sandi Mariscal M.D.   On: 02/05/2018 16:48     Scheduled Meds: . diltiazem  60 mg Oral Q6H  . doxycycline  100 mg Oral Q12H  . feeding supplement (ENSURE ENLIVE)  237 mL Oral BID BM  . flecainide  50 mg Oral BID  . furosemide  40 mg Intravenous Daily  . gabapentin  100 mg Oral TID  . magnesium oxide  400 mg Oral Daily  . metoprolol tartrate  100 mg Oral BID  . multivitamin with minerals  1 tablet Oral Daily  . pantoprazole  40 mg Oral Daily  . pyridOXINE  200 mg Oral BID  . senna-docusate  1 tablet Oral Daily  . sodium chloride flush  3 mL Intravenous Q12H  . cyanocobalamin  1,000 mcg Oral Daily   Continuous Infusions: . cefTRIAXone (ROCEPHIN)  IV 1 g (02/07/18 1250)    Assessment/Plan:  1. Atrial  fibrillation with rapid ventricular response, status post cardiology follow-up .  Continue Cardizem, flecainide and metoprolol for rate control.  Coumadin on hold.  INR coming down slowly. 2. Acute diastolic congestive heart failure.  Continue IV Lasix daily for diuresis 3. Large right pleural effusion.  S/p Thoracentesis removing 1.3 L.  Cytology negative.  Cultures so far negative 4. Hypoxia with pneumonia on Rocephin and doxycycline antibiotics. 5. Liver mass.  Most probably metastatic disease versus hepatocellular carcinoma MRI shows multiple masses.  Tumor markers ordered.  Oncology following.  Liver biopsy if okay with oncology after evaluation of paracentesis fluid cytology.  Status post paracentesis follow-up cytology results.  Appreciate oncology follow-up 6. Cirrhosis and ascites.  Paracentesis done yesterday 7. Weakness.  Physical therapy evaluation appreciated 8. Sleep apnea on CPAP at night 9. Essential hypertension on metoprolol and now Cardizem 10. Long-term prognosis poor discussed medical condition treatment plan with patient's family in detail 36. Acute hyperkalemia potassium supplements on hold.  Follow-up electrolytes.  Code Status: DNR    Code Status Orders  (From admission, onward)         Start      Ordered   02/13/2018 2115  Do not attempt resuscitation (DNR)  Continuous    Question Answer Comment  In the event of cardiac or respiratory ARREST Do not call a "code blue"   In the event of cardiac or respiratory ARREST Do not perform Intubation, CPR, defibrillation or ACLS   In the event of cardiac or respiratory ARREST Use medication by any route, position, wound care, and other measures to relive pain and suffering. May use oxygen, suction and manual treatment of airway obstruction as needed for comfort.      01/26/2018 2114        Code Status History    Date Active Date Inactive Code Status Order ID Comments User Context   06/27/2017 1935 06/29/2017 2104 Full Code [id number]  Nicholes Mango, MD Inpatient   06/27/2017 1724 06/27/2017 1935 DNR [id number]  Fritzi Mandes, MD Inpatient   05/21/2017 1636 05/23/2017 1556 Full Code [id number]  Dustin Flock, MD Inpatient   08/26/2016 1411 08/31/2016 1659 Full Code [id number]  Epifanio Lesches, MD ED   08/04/2016 2221 08/14/2016 2040 Full Code [id number]  Hugelmeyer, [place], DO Inpatient   02/29/2016 2053 03/03/2016 1403 Full Code [id number]  Henreitta Leber, MD Inpatient    Advance Directive Documentation     Most Recent Value  Type of Advance Directive  Out of facility DNR (pink MOST or yellow form)  Pre-existing out of facility DNR order (yellow form or pink MOST form)  Physician notified to receive inpatient order  "MOST" Form in Place?  -     Family Communication: Family at the bedside Disposition Plan: Likely next week home with home health.  Consultants:  Cardiology  Antibiotics:  Rocephin  Doxycycline  Time spent: 32  minutes  [place]

## 2018-02-07 NOTE — Consult Note (Signed)
Name: Hannah Faulkner MRN: [id number] DOB: 1940/10/25     CONSULTATION DATE: 02/07/2018  REFERRING MD :  pyreddy  CHIEF COMPLAINT: SOB CT chest with liver mass B/l infiltrates  US thoracentesis 1.3 L of yellowish green fluid was removed.   HISTORY OF PRESENT ILLNESS:   77 yo morbidly obese female admitted for acute on chronic resp failure Found to have large pleural effusion and pulm edema +liver mass on CT chest incidental finding  Patient with progressive hypoxic resp failure and renal failure 9 L high flow  Patient is DNR/DNI  Has end stage dCHF, obesity, deconditioned state   Very Poor prognosis  Increased WOB  Patient family at bedside   PAST MEDICAL HISTORY :   has a past medical history of A-fib ([place]), CHF (congestive heart failure) ([place]), Hypertension, and Sleep apnea.  has a past surgical history that includes Polypectomy; Eye surgery; and Carpal tunnel release ([place]). Prior to Admission medications   Medication Sig Start Date End Date Taking? Authorizing Provider  albuterol (PROVENTIL HFA;VENTOLIN HFA) 108 (90 Base) MCG/ACT inhaler Inhale 2 puffs into the lungs every 6 (six) hours as needed for wheezing or shortness of breath.   Yes [provider]  ALPRAZolam (XANAX) 0.5 MG tablet TAKE 1 TABLET BY MOUTH AT BEDTIME AS NEEDED Patient taking differently: Take 0.5 mg by mouth at bedtime as needed for sleep.  11/10/17  Yes Jerrol Banana., MD  cyanocobalamin 1000 MCG tablet Take 1,000 mcg by mouth daily.   Yes [provider]  diltiazem (CARDIZEM CD) 120 MG 24 hr capsule Take 120 mg by mouth 2 (two) times daily. 01/11/18  Yes [provider]  flecainide (TAMBOCOR) 50 MG tablet Take 50 mg by mouth 2 (two) times daily.  11/16/13  Yes [provider]  gabapentin (NEURONTIN) 100 MG capsule TAKE 1 CAPSULE (100 MG TOTAL) BY MOUTH 3 (THREE) TIMES DAILY. 12/21/16  Yes Jerrol Banana., MD  glycerin adult 2 g suppository  Place 1 suppository rectally as needed for constipation. 06/18/17  Yes Merlyn Lot, MD  magnesium oxide (MAG-OX) 400 MG tablet Take 400 mg by mouth daily.   Yes [provider]  metolazone (ZAROXOLYN) 2.5 MG tablet Take 1 tablet (2.5 mg total) by mouth once a week. Patient taking differently: Take 2.5 mg by mouth See admin instructions. Take 1 tablet (2.5MG ) by mouth once a week as needed for excessive fluid 05/15/17 01/22/2018 Yes Darylene Price A, FNP  metoprolol tartrate (LOPRESSOR) 100 MG tablet Take 1 tablet (100 mg total) by mouth 2 (two) times daily. 08/31/16  Yes Theodoro Grist, MD  omeprazole (PRILOSEC) 20 MG capsule Take 1 capsule (20 mg total) by mouth at bedtime. 04/15/17  Yes Jerrol Banana., MD  potassium chloride SA (K-DUR,KLOR-CON) 20 MEQ tablet Take 2 tablets (40 mEq total) by mouth 2 (two) times daily. And an additional 59meq ever Wednesday with metolazone Patient taking differently: Take 40 mEq by mouth See admin instructions. Take 2 tablets (40MEQ) by mouth twice daily - take 1 additional tablet [facility]) once weekly if taken metolazone 10/19/17  Yes Darylene Price A, FNP  prochlorperazine (COMPAZINE) 10 MG tablet Take 1 tablet (10 mg total) by mouth every 8 (eight) hours as needed for nausea or vomiting. 11/09/17  Yes Darylene Price A, FNP  Pyridoxine HCl (VITAMIN B6) 200 MG TABS Take 200 tablets by mouth 2 (two) times daily.  11/16/13  Yes [provider]  senna-docusate (SENOKOT-S) 8.6-50 MG tablet  Take 1 tablet by mouth daily. Patient taking differently: Take 1 tablet by mouth at bedtime.  06/16/17  Yes Jerrol Banana., MD  torsemide (DEMADEX) 20 MG tablet Take 2 tablets (40 mg total) by mouth 2 (two) times daily. 01/11/18  Yes Darylene Price A, FNP  warfarin (COUMADIN) 2 MG tablet Take 2 mg by mouth every other Hannah. Alternate daily with 2.5MG  tablet   Yes [provider]  warfarin (COUMADIN) 2.5 MG tablet Take 2.5 mg by mouth every other Hannah.  Alternate daily with 2MG  tablet 01/11/18  Yes [provider]  polyethylene glycol powder (GLYCOLAX/MIRALAX) powder Take 17 g by mouth 2 (two) times daily as needed. 06/16/17   Jerrol Banana., MD   Allergies  Allergen Reactions  . Ampicillin   . Prinzide  [Lisinopril-Hydrochlorothiazide]   . Penicillins Rash    .Has patient had a PCN reaction causing immediate rash, facial/tongue/throat swelling, SOB or lightheadedness with hypotension: Unknown Has patient had a PCN reaction causing severe rash involving mucus membranes or skin necrosis: No Has patient had a PCN reaction that required hospitalization: No Has patient had a PCN reaction occurring within the last 10 years: No If all of the above answers are "NO", then may proceed with Cephalosporin use.     FAMILY HISTORY:  family history includes Atrial fibrillation in her sister; CVA in her mother and sister; Dementia in her sister; Diabetes in her sister; Heart attack in her mother and sister; Heart disease in her mother; Hypertension in her mother; Liver cancer in her brother; Lung cancer in her father; Thyroid disease in her sister, sister, and sister. SOCIAL HISTORY:  reports that she has never smoked. She has never used smokeless tobacco. She reports that she does not drink alcohol or use drugs.   Review of Systems:  Gen:  +resp distress HEENT: Denies blurred vision, double vision, ear pain, eye pain, hearing loss, nose bleeds, sore throat Cardiac:  No dizziness, chest pain or heaviness, chest tightness,edema, No JVD Resp: +SOB Gi: Denies swallowing difficulty, stomach pain, nausea or vomiting, diarrhea, constipation, bowel incontinence loss of appetitie Other:  All other systems negative    VITAL SIGNS: Temp:  [97.8 F (36.6 C)-98.3 F (36.8 C)] 97.8 F (36.6 C) (11/17 0753) Pulse Rate:  [76-105] 76 (11/17 0753) Resp:  [16-21] 19 (11/17 0753) BP: (87-165)/(52-127) 105/52 (11/17 0753) SpO2:  [90 %-96 %] 92  % (11/17 1300) Weight:  [67.2 kg] 67.2 kg (11/17 0612)  Physical Examination:  GENERAL:critically ill appearing, +resp distress HEAD: Normocephalic, atraumatic.  EYES: Pupils equal, round, reactive to light.  No scleral icterus.  MOUTH: Moist mucosal membrane. NECK: Supple. No JVD.  PULMONARY: +rhonchi, +wheezing CARDIOVASCULAR: S1 and S2. Regular rate and rhythm. No murmurs, rubs, or gallops.  GASTROINTESTINAL: Soft, nontender, -distended. No masses. Positive bowel sounds. No hepatosplenomegaly.  MUSCULOSKELETAL: No swelling, clubbing, or edema.  NEUROLOGIC: lethargic SKIN:intact,warm,dry    ASSESSMENT / PLAN:  77 yo obese white female with end stage  resp failure [facility] with pulm edema, effusions, ascites with progressive resp distress and failure with acute renal failure in setting of liver mass c/w malignancy.  Prognosis is poor Patient is in dying process at this time I have explained this to family  Patient does NOT want machines to stay alive  Patient is DNR/DNI  1.wean fio2 as needed 2.try traditional high flow [place] 3.nasal CPAP at night 4.morphine as needed 5.BD therapy 6.start Iv steroids  Recommend Palliative care consult and  recommend Hospice and comfort care measures.     Corrin Parker, M.D.  [facility]  Medical Director [place] Director [facility] Department

## 2018-02-08 DIAGNOSIS — Z515 Encounter for palliative care: Secondary | ICD-10-CM

## 2018-02-08 DIAGNOSIS — I4891 Unspecified atrial fibrillation: Secondary | ICD-10-CM

## 2018-02-08 DIAGNOSIS — R0600 Dyspnea, unspecified: Secondary | ICD-10-CM

## 2018-02-08 LAB — BASIC METABOLIC PANEL
Anion gap: 5 (ref 5–15)
BUN: 42 mg/dL — ABNORMAL HIGH (ref 8–23)
CHLORIDE: 99 mmol/L (ref 98–111)
CO2: 32 mmol/L (ref 22–32)
CREATININE: 1.32 mg/dL — AB (ref 0.44–1.00)
Calcium: 8.9 mg/dL (ref 8.9–10.3)
GFR, EST AFRICAN AMERICAN: 44 mL/min — AB (ref >60)
GFR, EST NON AFRICAN AMERICAN: 38 mL/min — AB (ref >60)
Glucose, Bld: 123 mg/dL — ABNORMAL HIGH (ref 70–99)
POTASSIUM: 6.2 mmol/L — AB (ref 3.5–5.1)
SODIUM: 136 mmol/L (ref 135–145)

## 2018-02-08 LAB — CBC
HCT: 44.1 % (ref 36.0–46.0)
Hemoglobin: 13.3 g/dL (ref 12.0–15.0)
MCH: 26.5 pg (ref 26.0–34.0)
MCHC: 30.2 g/dL (ref 30.0–36.0)
MCV: 87.8 fL (ref 80.0–100.0)
PLATELETS: 333 10*3/uL (ref 150–400)
RBC: 5.02 MIL/uL (ref 3.87–5.11)
RDW: 16.6 % — ABNORMAL HIGH (ref 11.5–15.5)
WBC: 13.8 10*3/uL — AB (ref 4.0–10.5)
nRBC: 0 % (ref 0.0–0.2)

## 2018-02-08 LAB — BODY FLUID CULTURE
Culture: NO GROWTH
Gram Stain: NONE SEEN

## 2018-02-08 LAB — PATHOLOGIST SMEAR REVIEW

## 2018-02-08 LAB — PROTIME-INR
INR: 1.8
PROTHROMBIN TIME: 20.7 s — AB (ref 11.4–15.2)

## 2018-02-08 MED ORDER — HEPARIN (PORCINE) 25000 UT/250ML-% IV SOLN
900.0000 [IU]/h | INTRAVENOUS | Status: DC
  Administered 2018-02-08: 900 [IU]/h via INTRAVENOUS
  Filled 2018-02-08: qty 250

## 2018-02-08 MED ORDER — MORPHINE SULFATE (CONCENTRATE) 10 MG/0.5ML PO SOLN: 10.0000 mg | ORAL | Status: DC | PRN

## 2018-02-08 MED ORDER — LORAZEPAM 2 MG/ML IJ SOLN
1.0000 mg | INTRAMUSCULAR | Status: DC | PRN
  Administered 2018-02-08: 2 mg via INTRAVENOUS
  Filled 2018-02-08 (×2): qty 1

## 2018-02-08 MED ORDER — SODIUM POLYSTYRENE SULFONATE 15 GM/60ML PO SUSP
30.0000 g | Freq: Once | ORAL | Status: DC
  Filled 2018-02-08: qty 120

## 2018-02-08 MED ORDER — HEPARIN BOLUS VIA INFUSION
3500.0000 [IU] | Freq: Once | INTRAVENOUS | Status: AC
  Administered 2018-02-08: 3500 [IU] via INTRAVENOUS
  Filled 2018-02-08: qty 3500

## 2018-02-08 MED ORDER — GLYCOPYRROLATE 0.2 MG/ML IJ SOLN
0.2000 mg | INTRAMUSCULAR | Status: DC | PRN
  Filled 2018-02-08: qty 1

## 2018-02-08 MED ORDER — HYDROMORPHONE HCL 1 MG/ML IJ SOLN
0.5000 mg | INTRAMUSCULAR | Status: DC | PRN
  Administered 2018-02-09 (×2): 1 mg via INTRAVENOUS
  Filled 2018-02-08 (×2): qty 1

## 2018-02-08 NOTE — Progress Notes (Signed)
Patient resting in bed comfortably at this time. Family at bedside.  Patient states no c/o nausea or pain.  Repositioned in bed. Call bell in reach.

## 2018-02-08 NOTE — Progress Notes (Signed)
[place] [facility] [place] NAME: Hannah Faulkner    MR#:  [id number]  DATE OF BIRTH:  28-Apr-1940  SUBJECTIVE:  CHIEF COMPLAINT:   Chief Complaint  Patient presents with  . Shortness of Breath  . Atrial Fibrillation  Patient seen and evaluated today Has increased shortness of breath Currently on oxygen via high flow nasal cannula 10 L Was evaluated by intensivist who recommended supportive care Patient and family do not want any aggressive therapies They want only comfort care  REVIEW OF SYSTEMS:    ROS  CONSTITUTIONAL: No documented fever. Has fatigue, weakness. No weight gain, no weight loss.  EYES: No blurry or double vision.  ENT: No tinnitus. No postnasal drip. No redness of the oropharynx.  RESPIRATORY: Has cough, no wheeze, no hemoptysis. Has dyspnea.  CARDIOVASCULAR: No chest pain. No orthopnea. No palpitations. No syncope.  GASTROINTESTINAL: No nausea, no vomiting or diarrhea. Has abdominal pain. No melena or hematochezia.  GENITOURINARY: No dysuria or hematuria.  ENDOCRINE: No polyuria or nocturia. No heat or cold intolerance.  HEMATOLOGY: No anemia. No bruising. No bleeding.  INTEGUMENTARY: No rashes. No lesions.  MUSCULOSKELETAL: No arthritis. No swelling. No gout.  NEUROLOGIC: No numbness, tingling, or ataxia. No seizure-type activity.  PSYCHIATRIC: No anxiety. No insomnia. No ADD.   DRUG ALLERGIES:   Allergies  Allergen Reactions  . Ampicillin   . Prinzide  [Lisinopril-Hydrochlorothiazide]   . Penicillins Rash    .Has patient had a PCN reaction causing immediate rash, facial/tongue/throat swelling, SOB or lightheadedness with hypotension: Unknown Has patient had a PCN reaction causing severe rash involving mucus membranes or skin necrosis: No Has patient had a PCN reaction that required hospitalization: No Has patient had a PCN reaction occurring within the last 10 years: No If all of the above answers are "NO", then may  proceed with Cephalosporin use.     VITALS:  Blood pressure 94/64, pulse 99, temperature 97.8 F (36.6 C), temperature source Oral, resp. rate 20, height 5\' 1"  (1.549 m), weight 68.5 kg, SpO2 90 %.  PHYSICAL EXAMINATION:   Physical Exam  GENERAL:  77 y.o.-year-old patient lying in the bed with no acute distress.  EYES: Pupils equal, round, reactive to light and accommodation. No scleral icterus. Extraocular muscles intact.  HEENT: Head atraumatic, normocephalic. Oropharynx and nasopharynx clear.  NECK:  Supple, no jugular venous distention. No thyroid enlargement, no tenderness.  LUNGS: Decreased breath sounds bilaterally, bilateral rales heard.  Uses accessory muscles for respiration CARDIOVASCULAR: S1, S2 tachycardia noted. No murmurs, rubs, or gallops.  ABDOMEN: Soft, nontender, distended. Bowel sounds present. No organomegaly or mass.  EXTREMITIES: No cyanosis, clubbing or edema b/l.    NEUROLOGIC: Cranial nerves II through XII are intact. No focal Motor or sensory deficits b/l.   PSYCHIATRIC: The patient is alert and oriented x 3.  SKIN: No obvious rash, lesion, or ulcer.   LABORATORY PANEL:   CBC Recent Labs  Lab 02/08/18 0512  WBC 13.8*  HGB 13.3  HCT 44.1  PLT 333   ------------------------------------------------------------------------------------------------------------------ Chemistries  Recent Labs  Lab 02/04/18 0558  02/08/18 0512  NA 140   < > 136  K 4.4   < > 6.2*  CL 101   < > 99  CO2 29   < > 32  GLUCOSE 118*   < > 123*  BUN 16   < > 42*  CREATININE 0.66   < > 1.32*  CALCIUM 8.6*   < > 8.9  AST 30  --   --   ALT 13  --   --   ALKPHOS 97  --   --   BILITOT 1.1  --   --    < > = values in this interval not displayed.   ------------------------------------------------------------------------------------------------------------------  Cardiac Enzymes Recent Labs  Lab 02/02/2018 1706  TROPONINI <0.03    ------------------------------------------------------------------------------------------------------------------  RADIOLOGY:  No results found.   ASSESSMENT AND PLAN:   -Metastatic carcinoma Supportive care Status post oncology evaluation Patient and family do not want any aggressive therapies They only want comfort care Comfort care measures initiated  -Pleural effusion, ascites with volume overload Status post thoracentesis and paracentesis Patient and family do not want any aggressive therapies The only want comfort care  -Acute renal failure Supportive care  -Acute hypoxic respiratory failure Continue oxygen via high flow nasal cannula Breathing treatments PRN morphine for air hunger  -Decompensated liver disease Supportive care  -Overall prognosis poor  All the records are reviewed and case discussed with Care Management/Social Worker. Management plans discussed with the patient, family and they are in agreement.  CODE STATUS: DNR  DVT Prophylaxis: SCDs  TOTAL TIME TAKING CARE OF THIS PATIENT: 35 minutes.   POSSIBLE D/C IN 1 to 2 DAYS, DEPENDING ON CLINICAL CONDITION.  Saundra Shelling M.D on 02/08/2018 at 1:00 PM  Between 7am to 6pm - Pager - [phone]  After 6pm go to [email or web address] - password [email or web address] [place] [facility]  Office  [phone]  CC: Primary care physician; Jerrol Banana., MD  Note: This dictation was prepared with Dragon dictation along with smaller phrase technology. Any transcriptional errors that result from this process are unintentional.

## 2018-02-08 NOTE — Consult Note (Signed)
[place]  [phone] [phone]   Name: Hannah Faulkner Date: 02/08/2018 MRN: [id number]  DOB: 14-Mar-1941  Patient Care Team: Jerrol Banana., MD as PCP - General (Family Medicine) Alisa Graff, FNP as Nurse Practitioner (Family Medicine) Isaias Cowman, MD as Consulting Physician (Cardiology) Flora Lipps, MD as Consulting Physician (Pulmonary Disease) Anell Barr, OD as Consulting Physician (Optometry)    REASON FOR CONSULTATION: Palliative Care consult requested for this 77 y.o. female with multiple medical problems including atrial fibrillation on Coumadin, diastolic dysfunction with history of CHF, chronic respiratory failure on 2 L home oxygen, OSA on CPAP, and hypertension, who was admitted to the hospital 02/17/2018 with acute on chronic respiratory failure.  She was found to have A. fib with RVR and probable acute on chronic CHF.  Patient was incidentally found to have a 6 x 10 cm hepatic mass on CT.  Patient was evaluated by oncology with recommendations for biopsy.  Patient's hospitalization has been complicated by persistent respiratory failure.  Patient and family opted to forego work-up and treatment and instead focus on comfort care only.  Palliative care was consulted to help address goals.  SOCIAL HISTORY:    Patient is widowed.  She has 3 sons.  She lives at home with her grandson.  Patient is retired and previously had a daycare.  ADVANCE DIRECTIVES:  Patient's son is healthcare power of attorney  CODE STATUS: DNR  PAST MEDICAL HISTORY: Past Medical History:  Diagnosis Date  . A-fib ([place])   . CHF (congestive heart failure) (H[facility])    diastolic- on 2l home o2  . Hypertension   . Sleep apnea     PAST SURGICAL HISTORY:  Past Surgical History:  Procedure Laterality Date  . CARPAL TUNNEL RELEASE Right   . EYE SURGERY    . POLYPECTOMY     colon poly premoved     HEMATOLOGY/ONCOLOGY HISTORY:   No history exists.    ALLERGIES:  is allergic to ampicillin; prinzide  [lisinopril-hydrochlorothiazide]; and penicillins.  MEDICATIONS:  Current Facility-Administered Medications  Medication Dose Route Frequency Provider Last Rate Last Dose  . acetaminophen (TYLENOL) tablet 650 mg  650 mg Oral Q6H PRN Gladstone Lighter, MD       Or  . acetaminophen (TYLENOL) suppository 650 mg  650 mg Rectal Q6H PRN Gladstone Lighter, MD      . albuterol (PROVENTIL) (2.5 MG/3ML) 0.083% nebulizer solution 3 mL  3 mL Inhalation Q6H PRN Gladstone Lighter, MD   3 mL at 02/04/18 1126  . budesonide (PULMICORT) nebulizer solution 0.5 mg  0.5 mg Nebulization BID Flora Lipps, MD   0.5 mg at 02/08/18 0810  . furosemide (LASIX) injection 40 mg  40 mg Intravenous Daily Doristine Mango L, PA-C   40 mg at 02/08/18 1045  . glycopyrrolate (ROBINUL) injection 0.2 mg  0.2 mg Intravenous Q4H PRN Borders, Kirt Boys, NP      . HYDROmorphone (DILAUDID) injection 0.5-1 mg  0.5-1 mg Intravenous Q2H PRN Borders, Kirt Boys, NP      . ipratropium-albuterol (DUONEB) 0.5-2.5 (3) MG/3ML nebulizer solution 3 mL  3 mL Nebulization Q4H Flora Lipps, MD   3 mL at 02/08/18 1119  . LORazepam (ATIVAN) injection 1-2 mg  1-2 mg Intravenous Q4H PRN Borders, Kirt Boys, NP      . ondansetron (ZOFRAN) tablet 4 mg  4 mg Oral Q6H PRN Gladstone Lighter, MD       Or  .  ondansetron (ZOFRAN) injection 4 mg  4 mg Intravenous Q6H PRN Gladstone Lighter, MD   4 mg at 02/07/18 2138  . sodium chloride flush (NS) 0.9 % injection 3 mL  3 mL Intravenous Q12H Loletha Grayer, MD   3 mL at 02/08/18 1029  . sodium polystyrene (KAYEXALATE) 15 GM/60ML suspension 30 g  30 g Oral Once Saundra Shelling, MD        VITAL SIGNS: BP 94/64 (BP Location: Left Arm)   Pulse 99   Temp 97.8 F (36.6 C) (Oral)   Resp 20   Ht 5' 1"  (1.549 m)   Wt 151 lb 0.2 oz (68.5 kg)   SpO2 90%   BMI 28.53 kg/m  Filed Weights   02/06/18 0350  02/07/18 0612 02/08/18 0426  Weight: 149 lb 3.2 oz (67.7 kg) 148 lb 2.4 oz (67.2 kg) 151 lb 0.2 oz (68.5 kg)    Estimated body mass index is 28.53 kg/m as calculated from the following:   Height as of this encounter: 5' 1"  (1.549 m).   Weight as of this encounter: 151 lb 0.2 oz (68.5 kg).  LABS: CBC:    Component Value Date/Time   WBC 13.8 (H) 02/08/2018 0512   HGB 13.3 02/08/2018 0512   HGB 11.1 09/15/2017 1515   HCT 44.1 02/08/2018 0512   HCT 33.5 (L) 09/15/2017 1515   PLT 333 02/08/2018 0512   PLT 221 09/15/2017 1515   MCV 87.8 02/08/2018 0512   MCV 84 09/15/2017 1515   NEUTROABS 5.6 09/15/2017 1515   LYMPHSABS 1.5 09/15/2017 1515   MONOABS 0.9 08/06/2016 1131   EOSABS 0.1 09/15/2017 1515   BASOSABS 0.0 09/15/2017 1515   Comprehensive Metabolic Panel:    Component Value Date/Time   NA 136 02/08/2018 0512   NA 139 09/15/2017 1515   K 6.2 (H) 02/08/2018 0512   CL 99 02/08/2018 0512   CO2 32 02/08/2018 0512   BUN 42 (H) 02/08/2018 0512   BUN 10 09/15/2017 1515   CREATININE 1.32 (H) 02/08/2018 0512   GLUCOSE 123 (H) 02/08/2018 0512   CALCIUM 8.9 02/08/2018 0512   AST 30 02/04/2018 0558   ALT 13 02/04/2018 0558   ALKPHOS 97 02/04/2018 0558   BILITOT 1.1 02/04/2018 0558   BILITOT 0.3 09/15/2017 1515   PROT 6.5 02/04/2018 0558   PROT 6.8 09/15/2017 1515   ALBUMIN 3.0 (L) 02/04/2018 0558   ALBUMIN 4.0 09/15/2017 1515    RADIOGRAPHIC STUDIES: Dg Chest 1 View  Result Date: 02/04/2018 CLINICAL DATA:  Post thoracentesis on the right side EXAM: CHEST  1 VIEW COMPARISON:  02/08/2018 FINDINGS: Moderate right pleural effusion, decreasing following thoracentesis. No pneumothorax. Diffuse airspace disease throughout the right lung. Diffuse interstitial opacities throughout the lungs bilaterally, likely edema. Mild cardiomegaly. IMPRESSION: Decreasing right effusion following thoracentesis, now moderate. No pneumothorax. Diffuse interstitial opacities likely reflects  interstitial edema. Asymmetric airspace disease throughout the right lung could reflect asymmetric edema or infection. Electronically Signed   By: Rolm Baptise M.D.   On: 02/04/2018 10:05   Dg Chest 2 View  Result Date: 01/22/2018 CLINICAL DATA:  Short of breath.  Exhausted. EXAM: CHEST - 2 VIEW COMPARISON:  06/27/2017 FINDINGS: Large right pleural effusion. Interstitial thickening of the left lung. No significant left pleural effusion. No pneumothorax. Stable cardiomegaly. No acute osseous abnormality. IMPRESSION: Large right pleural effusion. Stable cardiomegaly. Mild left lower lung airspace disease likely reflecting atelectasis. Electronically Signed   By: Kathreen Devoid   On: 02/06/2018 17:35  Ct Chest W Contrast  Result Date: 01/23/2018 CLINICAL DATA:  Increased heart rate, chest pain, shortness of breath EXAM: CT CHEST WITH CONTRAST TECHNIQUE: Multidetector CT imaging of the chest was performed during intravenous contrast administration. CONTRAST:  32m OMNIPAQUE IOHEXOL 300 MG/ML  SOLN COMPARISON:  08/06/2016, 06/18/2017 FINDINGS: Cardiovascular: No significant vascular findings. Mild cardiomegaly. No pericardial effusion. Mediastinum/Nodes: No enlarged mediastinal, hilar, or axillary lymph nodes. Thyroid gland, trachea, and esophagus demonstrate no significant findings. Lungs/Pleura: Large right pleural effusion. Trace left pleural effusion. Mild left interstitial thickening and patchy areas of ground-glass opacity. Mild ground-glass opacity in the aerated portions of the right lung. No pneumothorax. Upper Abdomen: Ill-defined 5.9 x 9.1 cm nodular soft tissue mass in the left hepatic lobe with small volume perihepatic ascites. Musculoskeletal: No acute osseous abnormality. No aggressive osseous lesion. Degenerative disc disease with disc height loss throughout the thoracolumbar spine. IMPRESSION: 1. Large right pleural effusion. 2. Ill-defined 5.9 x 9.1 cm nodular soft tissue mass in the left  hepatic lobe with small volume perihepatic ascites. Finding is concerning for metastatic disease versus hepatocellular carcinoma. Recommend further evaluation with nonemergent MRI of the abdomen without and with intravenous contrast. Electronically Signed   By: HKathreen Devoid  On: 01/27/2018 18:34   Mr Abdomen W Wo Contrast  Result Date: 02/05/2018 CLINICAL DATA:  Evaluate liver mass EXAM: MRI ABDOMEN WITHOUT AND WITH CONTRAST TECHNIQUE: Multiplanar multisequence MR imaging of the abdomen was performed both before and after the administration of intravenous contrast. CONTRAST:  7 cc of Gadavist COMPARISON:  CT AP 08/06/2016 FINDINGS: Lower chest: Large right pleural effusion is again identified. Smaller left effusion noted. Hepatobiliary: Contour of the liver is slightly irregular without definitive nodularity. Dominant mass involving the entirety of the lateral segment of left lobe of liver and extending into the medial segment is identified measuring 12.4 by 8.6 by 5.9 cm. Additional smaller lesions are identified throughout the right lobe of liver, too numerous to count. There is sludge identified within the gallbladder. Mild diffuse gallbladder wall edema. No biliary ductal dilatation identified. Pancreas: No main duct dilatation or inflammation identified. No focal mass. Spleen:  Spleen is normal in size.  No focal splenic abnormality. Adrenals/Urinary Tract: The adrenal glands appear normal. No kidney mass or hydronephrosis. Stomach/Bowel: Visualized portions within the abdomen are unremarkable. Vascular/Lymphatic: Aortic atherosclerosis without aneurysm. The IVC and hepatic veins are patent. The main portal vein and right branch appear patent. The left branch of the portal vein is not visualized and may be thrombosed. 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 Large porta hepatic lymph node has a short axis of 1.9 cm, suspicious for metastatic adenopathy. Other:  Moderate volume of  ascites identified within the abdomen. Musculoskeletal: No suspicious bone lesions identified. IMPRESSION: 1. Multiple suspicious liver lesions are identified involving both lobes of liver. The dominant lesion involves the entirety of the lateral segment of left lobe and portions of the right lobe. The enhancement characteristics of these lesions are somewhat nonspecific. If there are risk factors for hepatocellular carcinoma findings may represent multifocal [place] Alternatively this could represent sequelae metastatic disease. Correlation with patient's risk factors, AFP levels, and tissue sampling if indicated. 2. 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 3. Ascites. Diagnostic paracentesis may be helpful to evaluate for peritoneal spread of disease. 4. Mild nodular contour of the liver may reflect cirrhosis. Again correlate with any risk factors and hepatitis serology may be helpful. Electronically Signed   By: TLovena Le  Clovis Riley M.D.   On: 02/05/2018 07:52   US Paracentesis  Result Date: 02/05/2018 INDICATION: Concern for hepatocellular carcinoma, now with symptomatic ascites. Please perform ultrasound-guided paracentesis for diagnostic and therapeutic purposes. EXAM: ULTRASOUND-GUIDED PARACENTESIS COMPARISON:  Abdominal MRI - 02/04/2018 MEDICATIONS: None. COMPLICATIONS: None immediate. TECHNIQUE: Informed written consent was obtained from the patient after a discussion of the risks, benefits and alternatives to treatment. A timeout was performed prior to the initiation of the procedure. Initial ultrasound scanning demonstrates a moderate amount of ascites within the right lower abdominal quadrant. The right lower abdomen was prepped and draped in the usual sterile fashion. 1% lidocaine with epinephrine was used for local anesthesia. An ultrasound image was saved for documentation purposed. An 8 Fr Safe-T-Centesis catheter was introduced. The paracentesis was performed. The  catheter was removed and a dressing was applied. The patient tolerated the procedure well without immediate post procedural complication. FINDINGS: A total of approximately 1.3 liters of serous fluid was removed. Samples were sent to the laboratory as requested by the clinical team. IMPRESSION: Successful ultrasound-guided paracentesis yielding 1.3 liters of peritoneal fluid. Electronically Signed   By: Sandi Mariscal M.D.   On: 02/05/2018 16:48   US Thoracentesis Asp Pleural Space W/img Guide  Result Date: 02/04/2018 CLINICAL DATA:  Large right pleural effusion. EXAM: ULTRASOUND GUIDED RIGHT THORACENTESIS COMPARISON:  Chest x-ray and CT of the chest on 02/05/2018 PROCEDURE: An ultrasound guided thoracentesis was thoroughly discussed with the patient and questions answered. The benefits, risks, alternatives and complications were also discussed. The patient understands and wishes to proceed with the procedure. Written consent was obtained. Ultrasound was performed to localize and mark an adequate pocket of fluid in the right chest. The area was then prepped and draped in the normal sterile fashion. 1% Lidocaine was used for local anesthesia. Under ultrasound guidance a 6 French Safe-T-Centesis catheter was introduced. Thoracentesis was performed. The catheter was removed and a dressing applied. COMPLICATIONS: None FINDINGS: A total of approximately 1.3 L of yellowish green fluid was removed. Additional fluid removal was not tolerated due to development of uncontrolled coughing. Ultrasound shows moderate right pleural fluid remaining after thoracentesis. A fluid sample was sent for laboratory analysis. IMPRESSION: Successful ultrasound guided right thoracentesis yielding 1.3 L of pleural fluid. The patient could not tolerate further fluid removal with moderate right pleural fluid remaining upon completion of the procedure. Electronically Signed   By: Aletta Edouard M.D.   On: 02/04/2018 11:26    PERFORMANCE  STATUS (ECOG) : 4 - Bedbound  Review of Systems As noted above. Otherwise, a complete review of systems is negative.  Physical Exam General: Ill-appearing Cardiovascular: regular rate and rhythm Pulmonary: Poor air movement, on high flow nasal cannula Abdomen: soft, nontender, + bowel sounds GU: no suprapubic tenderness Extremities: no edema, varicosities  Skin: no rashes Neurological: Weakness, wakes when stimulated, speaking to family  IMPRESSION: Patient opted to forego work-up and treatment of the cancer.  She has communicated her desire just to focus on comfort.  Patient is currently comfortable appearing but remains on high flow nasal cannula.  Patient wakes when stimulated and told me she wanted to make sure that all medications not comfort-related were discontinued.  She also verbalized a desire not to be resuscitated or have her life prolonged on machines.  I met in private with patient's son.  He confirmed family plan for comfort care only.  He says family recognize that patient is likely at end-of-life.  His brother is currently at a  funeral home planning for her funeral.  We discussed option of transitioning patient to hospice care.  However, family discussed that option this afternoon and would prefer to keep her in the hospital for now.  Patient is currently comfortable appearing.  She is status post recent paracentesis thoracentesis.  Her respiratory status remains tenuous and she is at high risk for decompensation.  Given renal failure, I have switched her morphine to hydromorphone.  I have discussed patient's care with the nursing staff encouraged them to give her medication as appropriate to manage her symptoms.  I would have a low threshold for initiation of a continuous opioid infusion if necessary to ensure her comfort.  PLAN: 1.  Comfort care 2.  DNR 3.  Stop non-comfort medications 4.  Discontinue morphine as renal failure makes her prone to neurotoxicity 5.  Start  IV hydromorphone as needed 6.  Would have low threshold for starting a hydromorphone drip if needed to manage her symptoms 7.  Discussed hospice home but family and patient currently want to stay in the hospital 8.  Will follow   Patient expressed understanding and was in agreement with this plan. She also understands that She can call clinic at any time with any questions, concerns, or complaints.     Time Total: 60 minutes  Visit consisted of counseling and education dealing with the complex and emotionally intense issues of symptom management and palliative care in the setting of serious and potentially life-threatening illness.Greater than 50%  of this time was spent counseling and coordinating care related to the above assessment and plan.  Signed by: Altha Harm, PhD, DNP, NP-C, [facility] [phone] (Work Cell)

## 2018-02-08 NOTE — Consult Note (Signed)
ANTICOAGULATION CONSULT NOTE - Follow Up Consult  Pharmacy Consult for heparin drip  Indication: atrial fibrillation  Allergies  Allergen Reactions  . Ampicillin   . Prinzide  [Lisinopril-Hydrochlorothiazide]   . Penicillins Rash    .Has patient had a PCN reaction causing immediate rash, facial/tongue/throat swelling, SOB or lightheadedness with hypotension: Unknown Has patient had a PCN reaction causing severe rash involving mucus membranes or skin necrosis: No Has patient had a PCN reaction that required hospitalization: No Has patient had a PCN reaction occurring within the last 10 years: No If all of the above answers are "NO", then may proceed with Cephalosporin use.    Patient Measurements: Height: 5\' 1"  (154.9 cm) Weight: 148 lb 2.4 oz (67.2 kg) IBW/kg (Calculated) : 47.8  Vital Signs: Temp: 97.3 F (36.3 C) (11/17 2000) Temp Source: Oral (11/17 2000) BP: 88/75 (11/18 0035) Pulse Rate: 114 (11/18 0135)  Labs: Recent Labs    02/06/18 0734 02/07/18 0434 02/07/18 1825  HGB  --  13.9  --   HCT  --  45.9  --   PLT  --  349  --   LABPROT 29.2* 24.4* 22.3*  INR 2.81 2.23 1.99  CREATININE  --  1.32*  --     Estimated Creatinine Clearance: 31.3 mL/min (A) (by C-G formula based on SCr of 1.32 mg/dL (H)).  Assessment: Pharmacy consulted for heparin drip dosing and monitoring in 77 yo female with PMH of A. Fib.  Patient was taking warfarin PTA and had a supratherapeutic INR of 3.63 on admission. Warfarin has been held during admission and pharmacy has been instructed to start heparin once INR <1.9.   11/18 INR: 1.8  Goal of Therapy:  Heparin level 0.3-0.7 units/ml Monitor platelets by anticoagulation protocol: Yes   Plan:  INR this morning now <1.9  Give 3500 units bolus x 1 Start heparin infusion at 900 units/hr Check anti-Xa level in 8 hours and daily while on heparin Continue to monitor H&H and platelets  Pernell Dupre, PharmD, BCPS Clinical  Pharmacist 02/08/2018 4:30 AM

## 2018-02-08 NOTE — Care Management Important Message (Signed)
Copy of signed IM left with patient in room.  

## 2018-02-08 NOTE — Progress Notes (Signed)
Patient resting during my visit for svn. Granddaughter feeding her ice cream. Granddaughter states patient has been somewhat restless due to some of the meds she has received. She eventually ended up taking the hfnc off while I was in the room. Granddaughter states she has done that a couple of times and she feels putting her on the cpap will be a lot worse. Cpap on hold for now based on previous conversation from patient and conversation now from granddaughter.

## 2018-02-08 NOTE — Plan of Care (Signed)
  Problem: Activity: Goal: Risk for activity intolerance will decrease Outcome: Progressing Note:  Up to [facility] with 2 assist   Problem: Nutrition: Goal: Adequate nutrition will be maintained Outcome: Progressing   Problem: Safety: Goal: Ability to remain free from injury will improve Outcome: Progressing   Problem: Clinical Measurements: Goal: Respiratory complications will improve Outcome: Not Progressing Note:  Pt continues to need the hi flow nasal cannula, and desats extremely quickly if she takes it out. Highest I've seen O2 sats was 93%

## 2018-02-09 LAB — CYTOLOGY - NON PAP

## 2018-02-09 MED ORDER — SODIUM CHLORIDE 0.9 % IV SOLN
0.5000 mg/h | INTRAVENOUS | Status: DC
  Filled 2018-02-09: qty 5

## 2018-02-15 ENCOUNTER — Ambulatory Visit: Payer: Medicare Other | Admitting: Family

## 2018-02-21 NOTE — Progress Notes (Signed)
[place]  [phone] [phone]   Name: Hannah Faulkner Date: 02-23-2018 MRN: [id number]  DOB: 02-28-1941  Patient Care Team: Jerrol Banana., MD as PCP - General (Family Medicine) Alisa Graff, FNP as Nurse Practitioner (Family Medicine) Isaias Cowman, MD as Consulting Physician (Cardiology) Flora Lipps, MD as Consulting Physician (Pulmonary Disease) Anell Barr, OD as Consulting Physician (Optometry)    REASON FOR CONSULTATION: Palliative Care consult requested for this 77 y.o. female with multiple medical problems including atrial fibrillation on Coumadin, diastolic dysfunction with history of CHF, chronic respiratory failure on 2 L home oxygen, OSA on CPAP, and hypertension, who was admitted to the hospital 02/10/2018 with acute on chronic respiratory failure.  She was found to have A. fib with RVR and probable acute on chronic CHF.  Patient was incidentally found to have a 6 x 10 cm hepatic mass on CT.  Patient was evaluated by oncology with recommendations for biopsy.  Patient's hospitalization has been complicated by persistent respiratory failure.  Patient and family opted to forego work-up and treatment and instead focus on comfort care only.  Palliative care was consulted to help address goals.  CODE STATUS: DNR  PAST MEDICAL HISTORY: Past Medical History:  Diagnosis Date  . A-fib ([place])   . CHF (congestive heart failure) (HCC)    diastolic- on 2l home o2  . Hypertension   . Sleep apnea     PAST SURGICAL HISTORY:  Past Surgical History:  Procedure Laterality Date  . CARPAL TUNNEL RELEASE Right   . EYE SURGERY    . POLYPECTOMY     colon poly premoved    HEMATOLOGY/ONCOLOGY HISTORY:   No history exists.    ALLERGIES:  is allergic to ampicillin; prinzide  [lisinopril-hydrochlorothiazide]; and penicillins.  MEDICATIONS:  Current Facility-Administered Medications    Medication Dose Route Frequency Provider Last Rate Last Dose  . acetaminophen (TYLENOL) tablet 650 mg  650 mg Oral Q6H PRN Gladstone Lighter, MD       Or  . acetaminophen (TYLENOL) suppository 650 mg  650 mg Rectal Q6H PRN Gladstone Lighter, MD      . albuterol (PROVENTIL) (2.5 MG/3ML) 0.083% nebulizer solution 3 mL  3 mL Inhalation Q6H PRN Gladstone Lighter, MD   3 mL at 02/04/18 1126  . budesonide (PULMICORT) nebulizer solution 0.5 mg  0.5 mg Nebulization BID Flora Lipps, MD   0.5 mg at 02/08/18 1942  . furosemide (LASIX) injection 40 mg  40 mg Intravenous Daily Doristine Mango L, PA-C   40 mg at 02/08/18 1045  . glycopyrrolate (ROBINUL) injection 0.2 mg  0.2 mg Intravenous Q4H PRN Renalda Locklin, Kirt Boys, NP      . HYDROmorphone (DILAUDID) 50 mg in sodium chloride 0.9 % 100 mL (0.5 mg/mL) infusion  0.5 mg/hr Intravenous Continuous Chesley Valls, Vonna Kotyk R, NP      . HYDROmorphone (DILAUDID) injection 0.5-1 mg  0.5-1 mg Intravenous Q2H PRN Katonya Blecher, Kirt Boys, NP   1 mg at 2018-02-23 9833  . ipratropium-albuterol (DUONEB) 0.5-2.5 (3) MG/3ML nebulizer solution 3 mL  3 mL Nebulization Q4H Flora Lipps, MD   3 mL at 02-23-18 0337  . LORazepam (ATIVAN) injection 1-2 mg  1-2 mg Intravenous Q4H PRN Brynlei Klausner, Kirt Boys, NP   2 mg at 02/08/18 2314  . ondansetron (ZOFRAN) tablet 4 mg  4 mg Oral Q6H PRN Gladstone Lighter, MD       Or  . ondansetron [facility]) injection 4 mg  4 mg Intravenous Q6H PRN Gladstone Lighter, MD   4 mg at 02/07/18 2138  . sodium chloride flush (NS) 0.9 % injection 3 mL  3 mL Intravenous Q12H Loletha Grayer, MD   3 mL at 02/08/18 2314  . sodium polystyrene (KAYEXALATE) 15 GM/60ML suspension 30 g  30 g Oral Once Pyreddy, Reatha Harps, MD        VITAL SIGNS: BP 100/60 (BP Location: Left Arm)   Pulse (!) 132   Temp 98.5 F (36.9 C) (Oral)   Resp 20   Ht 5\' 1"  (1.549 m)   Wt 122 lb (55.3 kg)   SpO2 (!) 86%   BMI 23.05 kg/m  Filed Weights   02/07/18 0612 02/08/18 0426 February 19, 2018 0552   Weight: 148 lb 2.4 oz (67.2 kg) 151 lb 0.2 oz (68.5 kg) 122 lb (55.3 kg)    Estimated body mass index is 23.05 kg/m as calculated from the following:   Height as of this encounter: 5\' 1"  (1.549 m).   Weight as of this encounter: 122 lb (55.3 kg).  LABS: CBC:    Component Value Date/Time   WBC 13.8 (H) 02/08/2018 0512   HGB 13.3 02/08/2018 0512   HGB 11.1 09/15/2017 1515   HCT 44.1 02/08/2018 0512   HCT 33.5 (L) 09/15/2017 1515   PLT 333 02/08/2018 0512   PLT 221 09/15/2017 1515   MCV 87.8 02/08/2018 0512   MCV 84 09/15/2017 1515   NEUTROABS 5.6 09/15/2017 1515   LYMPHSABS 1.5 09/15/2017 1515   MONOABS 0.9 08/06/2016 1131   EOSABS 0.1 09/15/2017 1515   BASOSABS 0.0 09/15/2017 1515   Comprehensive Metabolic Panel:    Component Value Date/Time   NA 136 02/08/2018 0512   NA 139 09/15/2017 1515   K 6.2 (H) 02/08/2018 0512   CL 99 02/08/2018 0512   CO2 32 02/08/2018 0512   BUN 42 (H) 02/08/2018 0512   BUN 10 09/15/2017 1515   CREATININE 1.32 (H) 02/08/2018 0512   GLUCOSE 123 (H) 02/08/2018 0512   CALCIUM 8.9 02/08/2018 0512   AST 30 02/04/2018 0558   ALT 13 02/04/2018 0558   ALKPHOS 97 02/04/2018 0558   BILITOT 1.1 02/04/2018 0558   BILITOT 0.3 09/15/2017 1515   PROT 6.5 02/04/2018 0558   PROT 6.8 09/15/2017 1515   ALBUMIN 3.0 (L) 02/04/2018 0558   ALBUMIN 4.0 09/15/2017 1515    RADIOGRAPHIC STUDIES: Dg Chest 1 View  Result Date: 02/04/2018 CLINICAL DATA:  Post thoracentesis on the right side EXAM: CHEST  1 VIEW COMPARISON:  02/14/2018 FINDINGS: Moderate right pleural effusion, decreasing following thoracentesis. No pneumothorax. Diffuse airspace disease throughout the right lung. Diffuse interstitial opacities throughout the lungs bilaterally, likely edema. Mild cardiomegaly. IMPRESSION: Decreasing right effusion following thoracentesis, now moderate. No pneumothorax. Diffuse interstitial opacities likely reflects interstitial edema. Asymmetric airspace disease  throughout the right lung could reflect asymmetric edema or infection. Electronically Signed   By: Rolm Baptise M.D.   On: 02/04/2018 10:05   Dg Chest 2 View  Result Date: 02/02/2018 CLINICAL DATA:  Short of breath.  Exhausted. EXAM: CHEST - 2 VIEW COMPARISON:  06/27/2017 FINDINGS: Large right pleural effusion. Interstitial thickening of the left lung. No significant left pleural effusion. No pneumothorax. Stable cardiomegaly. No acute osseous abnormality. IMPRESSION: Large right pleural effusion. Stable cardiomegaly. Mild left lower lung airspace disease likely reflecting atelectasis. Electronically Signed   By: Kathreen Devoid   On: 02/20/2018 17:35   Ct Chest W Contrast  Result Date: 02/19/2018 CLINICAL  DATA:  Increased heart rate, chest pain, shortness of breath EXAM: CT CHEST WITH CONTRAST TECHNIQUE: Multidetector CT imaging of the chest was performed during intravenous contrast administration. CONTRAST:  79mL OMNIPAQUE IOHEXOL 300 MG/ML  SOLN COMPARISON:  08/06/2016, 06/18/2017 FINDINGS: Cardiovascular: No significant vascular findings. Mild cardiomegaly. No pericardial effusion. Mediastinum/Nodes: No enlarged mediastinal, hilar, or axillary lymph nodes. Thyroid gland, trachea, and esophagus demonstrate no significant findings. Lungs/Pleura: Large right pleural effusion. Trace left pleural effusion. Mild left interstitial thickening and patchy areas of ground-glass opacity. Mild ground-glass opacity in the aerated portions of the right lung. No pneumothorax. Upper Abdomen: Ill-defined 5.9 x 9.1 cm nodular soft tissue mass in the left hepatic lobe with small volume perihepatic ascites. Musculoskeletal: No acute osseous abnormality. No aggressive osseous lesion. Degenerative disc disease with disc height loss throughout the thoracolumbar spine. IMPRESSION: 1. Large right pleural effusion. 2. Ill-defined 5.9 x 9.1 cm nodular soft tissue mass in the left hepatic lobe with small volume perihepatic ascites.  Finding is concerning for metastatic disease versus hepatocellular carcinoma. Recommend further evaluation with nonemergent MRI of the abdomen without and with intravenous contrast. Electronically Signed   By: Kathreen Devoid   On: 02/16/2018 18:34   Mr Abdomen W Wo Contrast  Result Date: 02/05/2018 CLINICAL DATA:  Evaluate liver mass EXAM: MRI ABDOMEN WITHOUT AND WITH CONTRAST TECHNIQUE: Multiplanar multisequence MR imaging of the abdomen was performed both before and after the administration of intravenous contrast. CONTRAST:  7 cc of Gadavist COMPARISON:  CT AP 08/06/2016 FINDINGS: Lower chest: Large right pleural effusion is again identified. Smaller left effusion noted. Hepatobiliary: Contour of the liver is slightly irregular without definitive nodularity. Dominant mass involving the entirety of the lateral segment of left lobe of liver and extending into the medial segment is identified measuring 12.4 by 8.6 by 5.9 cm. Additional smaller lesions are identified throughout the right lobe of liver, too numerous to count. There is sludge identified within the gallbladder. Mild diffuse gallbladder wall edema. No biliary ductal dilatation identified. Pancreas: No main duct dilatation or inflammation identified. No focal mass. Spleen:  Spleen is normal in size.  No focal splenic abnormality. Adrenals/Urinary Tract: The adrenal glands appear normal. No kidney mass or hydronephrosis. Stomach/Bowel: Visualized portions within the abdomen are unremarkable. Vascular/Lymphatic: Aortic atherosclerosis without aneurysm. The IVC and hepatic veins are patent. The main portal vein and right branch appear patent. The left branch of the portal vein is not visualized and may be thrombosed. 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 Large porta hepatic lymph node has a short axis of 1.9 cm, suspicious for metastatic adenopathy. Other:  Moderate volume of ascites identified within the abdomen.  Musculoskeletal: No suspicious bone lesions identified. IMPRESSION: 1. Multiple suspicious liver lesions are identified involving both lobes of liver. The dominant lesion involves the entirety of the lateral segment of left lobe and portions of the right lobe. The enhancement characteristics of these lesions are somewhat nonspecific. If there are risk factors for hepatocellular carcinoma findings may represent multifocal [place]. Alternatively this could represent sequelae metastatic disease. Correlation with patient's risk factors, AFP levels, and tissue sampling if indicated. 2. 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 3. Ascites. Diagnostic paracentesis may be helpful to evaluate for peritoneal spread of disease. 4. Mild nodular contour of the liver may reflect cirrhosis. Again correlate with any risk factors and hepatitis serology may be helpful. Electronically Signed   By: Kerby Moors M.D.   On: 02/05/2018 07:52  US Paracentesis  Result Date: 02/05/2018 INDICATION: Concern for hepatocellular carcinoma, now with symptomatic ascites. Please perform ultrasound-guided paracentesis for diagnostic and therapeutic purposes. EXAM: ULTRASOUND-GUIDED PARACENTESIS COMPARISON:  Abdominal MRI - 02/04/2018 MEDICATIONS: None. COMPLICATIONS: None immediate. TECHNIQUE: Informed written consent was obtained from the patient after a discussion of the risks, benefits and alternatives to treatment. A timeout was performed prior to the initiation of the procedure. Initial ultrasound scanning demonstrates a moderate amount of ascites within the right lower abdominal quadrant. The right lower abdomen was prepped and draped in the usual sterile fashion. 1% lidocaine with epinephrine was used for local anesthesia. An ultrasound image was saved for documentation purposed. An 8 Fr Safe-T-Centesis catheter was introduced. The paracentesis was performed. The catheter was removed and a dressing was  applied. The patient tolerated the procedure well without immediate post procedural complication. FINDINGS: A total of approximately 1.3 liters of serous fluid was removed. Samples were sent to the laboratory as requested by the clinical team. IMPRESSION: Successful ultrasound-guided paracentesis yielding 1.3 liters of peritoneal fluid. Electronically Signed   By: Sandi Mariscal M.D.   On: 02/05/2018 16:48   US Thoracentesis Asp Pleural Space W/img Guide  Result Date: 02/04/2018 CLINICAL DATA:  Large right pleural effusion. EXAM: ULTRASOUND GUIDED RIGHT THORACENTESIS COMPARISON:  Chest x-ray and CT of the chest on 02/13/2018 PROCEDURE: An ultrasound guided thoracentesis was thoroughly discussed with the patient and questions answered. The benefits, risks, alternatives and complications were also discussed. The patient understands and wishes to proceed with the procedure. Written consent was obtained. Ultrasound was performed to localize and mark an adequate pocket of fluid in the right chest. The area was then prepped and draped in the normal sterile fashion. 1% Lidocaine was used for local anesthesia. Under ultrasound guidance a 6 French Safe-T-Centesis catheter was introduced. Thoracentesis was performed. The catheter was removed and a dressing applied. COMPLICATIONS: None FINDINGS: A total of approximately 1.3 L of yellowish green fluid was removed. Additional fluid removal was not tolerated due to development of uncontrolled coughing. Ultrasound shows moderate right pleural fluid remaining after thoracentesis. A fluid sample was sent for laboratory analysis. IMPRESSION: Successful ultrasound guided right thoracentesis yielding 1.3 L of pleural fluid. The patient could not tolerate further fluid removal with moderate right pleural fluid remaining upon completion of the procedure. Electronically Signed   By: Aletta Edouard M.D.   On: 02/04/2018 11:26    PERFORMANCE STATUS (ECOG) : 4 - Bedbound  Review of  Systems As noted above. Otherwise, a complete review of systems is negative.  Physical Exam General: critically ill appearing Cardiovascular: irregular Pulmonary: coarse ant fields, periods of apnea Abdomen: soft, nontender, + bowel sounds Extremities: no edema Skin: pale Neurological: poorly responsive  IMPRESSION: Patient is significantly declined overnight.  She is less responsive this morning.  She remains on high flow nasal cannula at 50 L and 100% FiO2. Patient still hypoxic despite being 100% FiO2. SaO2 has trended mid 80s.   Family at bedside state that patient has benefited from use of hydromorphone prn. However, that she has become symptomatic once it wears off. Family describe anxiety and air hunger. I discussed starting a continuous infusion of hydromorphone with all three sons. All family again verbalized an understanding that patient is at end of life and they ask for [place] to just keep her comfortable. All sons agreed with starting a continuous opioid infusion for comfort.   Case discussed with Dr. Estanislado Pandy.  PLAN: Comfort Care Start hydromorphone infusion at  0.5mg /hr. Titrate for comfort Emotional support provided to family   Time Total: 45 minutes  Visit consisted of counseling and education dealing with the complex and emotionally intense issues of symptom management and palliative care in the setting of serious and potentially life-threatening illness.Greater than 50%  of this time was spent counseling and coordinating care related to the above assessment and plan.  Signed by: Altha Harm, PhD, DNP, NP-C, [facility] [phone] (Work Cell)

## 2018-02-21 NOTE — Plan of Care (Signed)
  Problem: Clinical Measurements: Goal: Respiratory complications will improve Outcome: Not Progressing   Problem: Activity: Goal: Risk for activity intolerance will decrease Outcome: Not Progressing   

## 2018-02-21 NOTE — Progress Notes (Signed)
Patient's family given time with patient. Time of death was 57. [facility] called with reference number of [id number].

## 2018-02-21 NOTE — Progress Notes (Signed)
[place] [facility] [place] NAME: Hannah Faulkner    MR#:  [id number]  DATE OF BIRTH:  1940-08-26  SUBJECTIVE:  CHIEF COMPLAINT:   Chief Complaint  Patient presents with  . Shortness of Breath  . Atrial Fibrillation  Patient seen and evaluated today at 8:15 AM Has increased shortness of breath and in respiratory distress Currently on oxygen via high flow nasal cannula 10 L Patient currently on comfort care Has decreased responsiveness  REVIEW OF SYSTEMS:    ROS Able to obtain secondary to change in mental status and confusion  DRUG ALLERGIES:   Allergies  Allergen Reactions  . Ampicillin   . Prinzide  [Lisinopril-Hydrochlorothiazide]   . Penicillins Rash    .Has patient had a PCN reaction causing immediate rash, facial/tongue/throat swelling, SOB or lightheadedness with hypotension: Unknown Has patient had a PCN reaction causing severe rash involving mucus membranes or skin necrosis: No Has patient had a PCN reaction that required hospitalization: No Has patient had a PCN reaction occurring within the last 10 years: No If all of the above answers are "NO", then may proceed with Cephalosporin use.     VITALS:  Blood pressure 100/60, pulse (!) 132, temperature 98.5 F (36.9 C), temperature source Oral, resp. rate 20, height 5\' 1"  (1.549 m), weight 55.3 kg, SpO2 (!) 86 %.  PHYSICAL EXAMINATION:   Physical Exam  GENERAL:  77 y.o.-year-old patient lying in the bed in respiratory distress EYES: Pupils equal, round, reactive to light and accommodation. No scleral icterus. Extraocular muscles intact.  HEENT: Head atraumatic, normocephalic. Oropharynx and nasopharynx clear.  NECK:  Supple, no jugular venous distention. No thyroid enlargement, no tenderness.  LUNGS: Decreased breath sounds bilaterally, bilateral rales heard.  Uses accessory muscles for respiration CARDIOVASCULAR: S1, S2 tachycardia noted. No murmurs, rubs, or gallops.  ABDOMEN:  Soft, nontender, distended. Bowel sounds present. No organomegaly or mass.  EXTREMITIES: No cyanosis, clubbing or edema b/l.    NEUROLOGIC: Unable to obtain as patient is confused PSYCHIATRIC: The patient is alert and oriented none SKIN: No obvious rash, lesion, or ulcer.   LABORATORY PANEL:   CBC Recent Labs  Lab 02/08/18 0512  WBC 13.8*  HGB 13.3  HCT 44.1  PLT 333   ------------------------------------------------------------------------------------------------------------------ Chemistries  Recent Labs  Lab 02/04/18 0558  02/08/18 0512  NA 140   < > 136  K 4.4   < > 6.2*  CL 101   < > 99  CO2 29   < > 32  GLUCOSE 118*   < > 123*  BUN 16   < > 42*  CREATININE 0.66   < > 1.32*  CALCIUM 8.6*   < > 8.9  AST 30  --   --   ALT 13  --   --   ALKPHOS 97  --   --   BILITOT 1.1  --   --    < > = values in this interval not displayed.   ------------------------------------------------------------------------------------------------------------------  Cardiac Enzymes Recent Labs  Lab 01/22/2018 1706  TROPONINI <0.03   ------------------------------------------------------------------------------------------------------------------  RADIOLOGY:  No results found.   ASSESSMENT AND PLAN:   -Metastatic carcinoma Supportive care Status post oncology evaluation Patient and family do not want any aggressive therapies They only want comfort care and currently on comfort care Comfort care measures initiated IV morphine drip will be started today  -Pleural effusion, ascites with volume overload Status post thoracentesis and paracentesis Patient and family do not want  any aggressive therapies The only want comfort care  -Acute renal failure Supportive care  -Acute hypoxic respiratory failure Continue oxygen via high flow nasal cannula Breathing treatments PRN morphine for air hunger  -Decompensated liver disease Supportive care  -Overall prognosis poor  All  the records are reviewed and case discussed with Care Management/Social Worker. Management plans discussed with the patient, family and they are in agreement.  CODE STATUS: DNR  DVT Prophylaxis: SCDs  TOTAL TIME TAKING CARE OF THIS PATIENT: 22 minutes.   POSSIBLE D/C IN 1 to 2 DAYS, DEPENDING ON CLINICAL CONDITION.  Saundra Shelling M.D on 03/09/2018 at 10:13 AM  Between 7am to 6pm - Pager - [phone]  After 6pm go to [email or web address] - password [email or web address] [place] [facility]  Office  [phone]  CC: Primary care physician; Jerrol Banana., MD  Note: This dictation was prepared with Dragon dictation along with smaller phrase technology. Any transcriptional errors that result from this process are unintentional.

## 2018-02-21 NOTE — Death Summary Note (Signed)
Time of death 9:48 AM Date of death Feb 13, 2018  Cause of death 1.  Acute cardiorespiratory failure 2.  Acute hypoxic respiratory failure 3.  Metastatic carcinoma 4.  Renal failure 5.  Liver failure 6.  Pleural effusions  Brief history of course of hospital stay 77 year old female patient with history of diastolic heart failure, atrial fibrillation, hypertension, sleep apnea was admitted on 01/31/2018 to telemetry for atrial fibrillation with rapid rate.  She was started on IV Cardizem drip for rate control.  Patient was also managed for diastolic heart failure exacerbation with IV Lasix.  At the time of admission patient CT chest and abdomen and pelvis showed a 10 cm soft tissue mass in the liver.  Patient had  pleural effusions and ascites at the time of presentation and oncology consultation was done.  Thoracentesis and paracentesis was done.  Patient was worked up with chest x-ray, CT chest and MRI abdomen.  Patient continued to deteriorate and was hypoxic during her entire hospitalization and was in respiratory distress and needed high flow oxygen.  Patient and family do not want any resuscitation, intubation ventilator if the need arises.  She was DNR by [place].  As patient's medical condition continued to deteriorate she became more hypoxic and in respiratory distress with increasing pleural effusions and ascites.  Patient had multiorgan failure.  Family and patient do not want any aggressive measures they want only comfort care.  Comfort care measures were initiated.  Patient expired on 02/13/18.

## 2018-02-21 NOTE — Progress Notes (Signed)
Post mortem care done. AC notified. [facility] will pick up patient from room.

## 2018-02-21 DEATH — deceased

## 2018-03-09 LAB — FUNGUS CULTURE WITH STAIN

## 2018-03-09 LAB — FUNGUS CULTURE RESULT

## 2018-03-09 LAB — FUNGAL ORGANISM REFLEX

## 2018-05-17 ENCOUNTER — Ambulatory Visit: Payer: Self-pay | Admitting: Family Medicine

## 2018-12-17 IMAGING — CR DG CHEST 2V
1 series · 2 of 2 positions shown · non-contrast
Comparison: Chest x-ray of 03/10/2016

CLINICAL DATA: History of pneumonia in [REDACTED], followup,
persistent slight cough and left-sided chest pain

EXAM:
CHEST  2 VIEW

[Series 1: dg chest 2 view · 0.14mm/px · 2 of 2 slices shown]
[im 1/2]
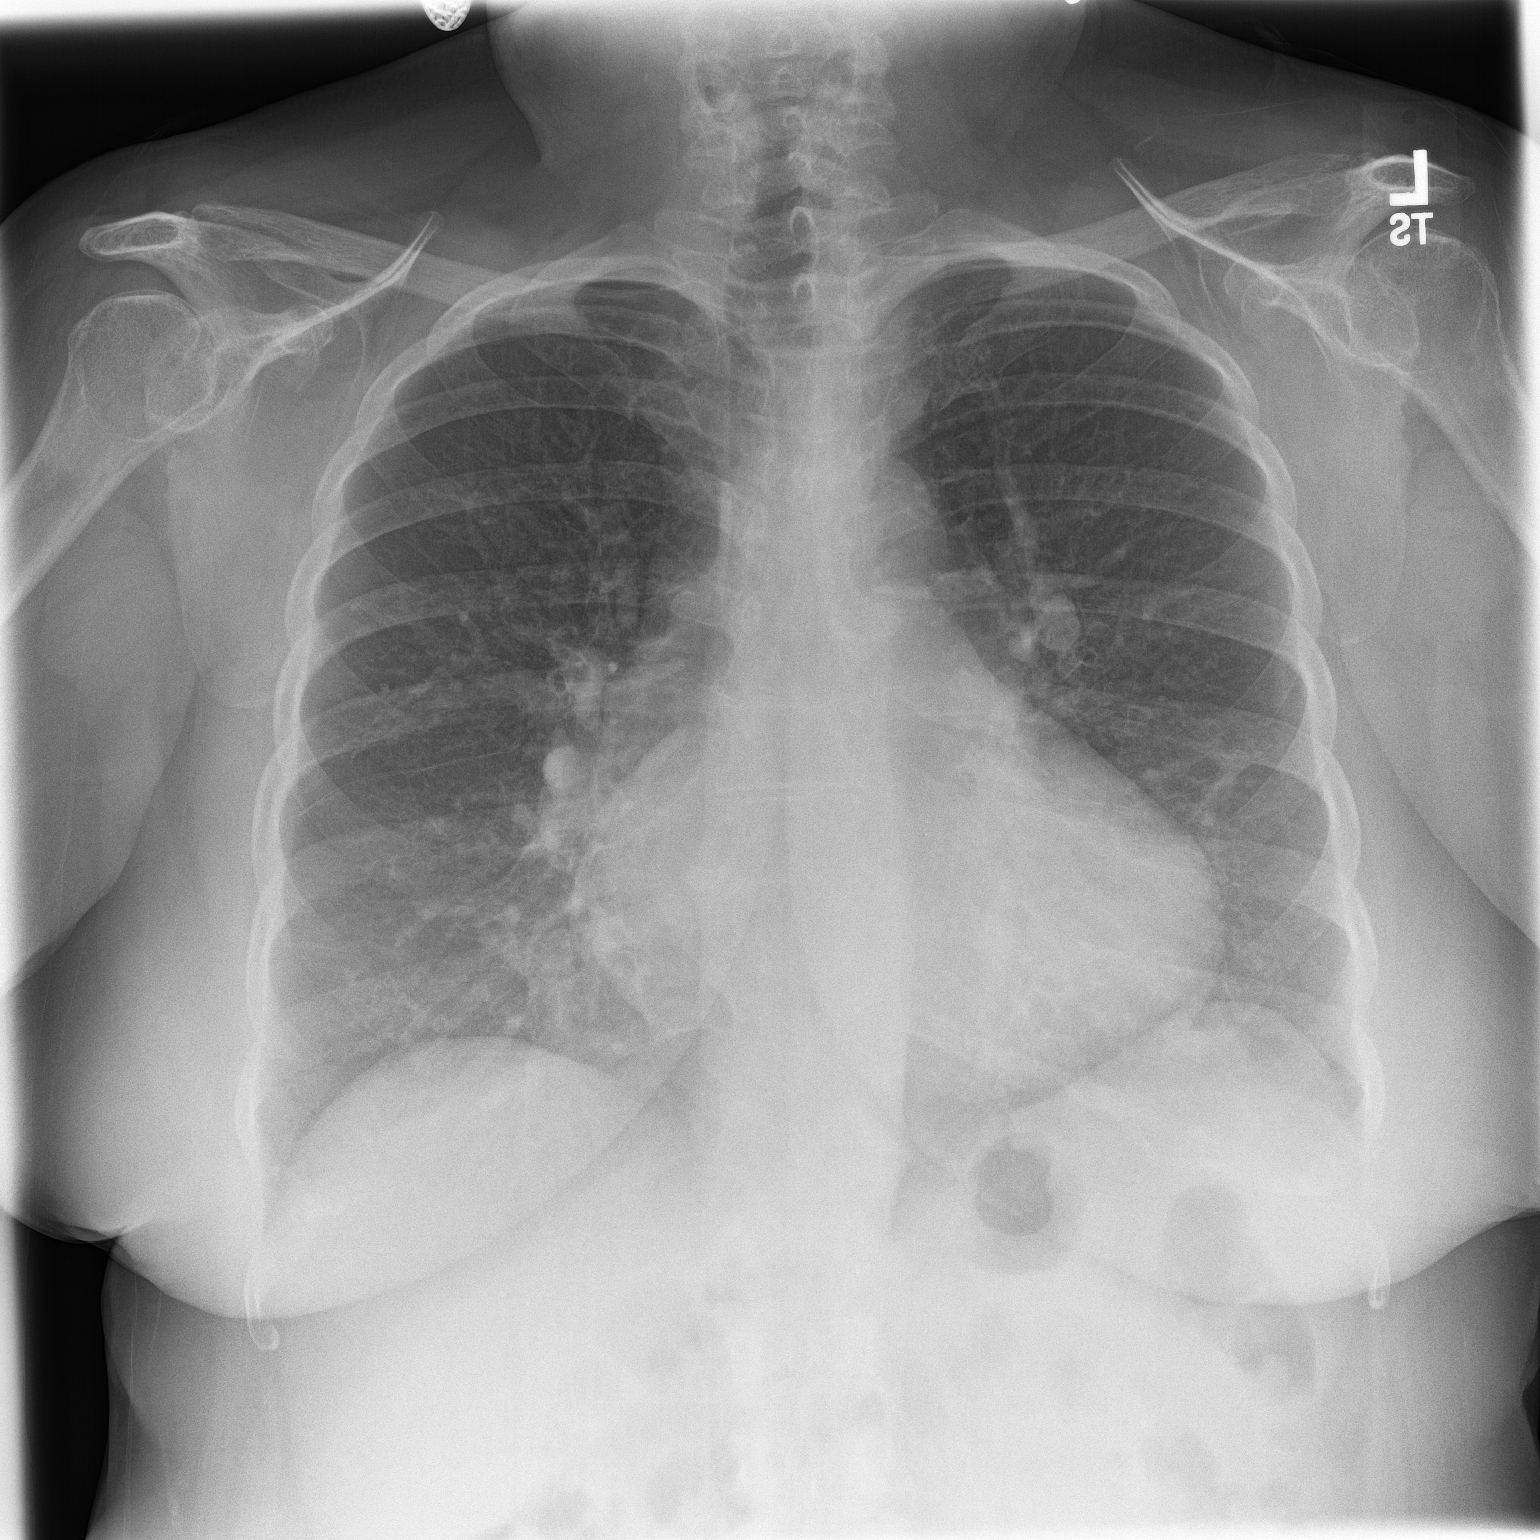
[im 2/2]
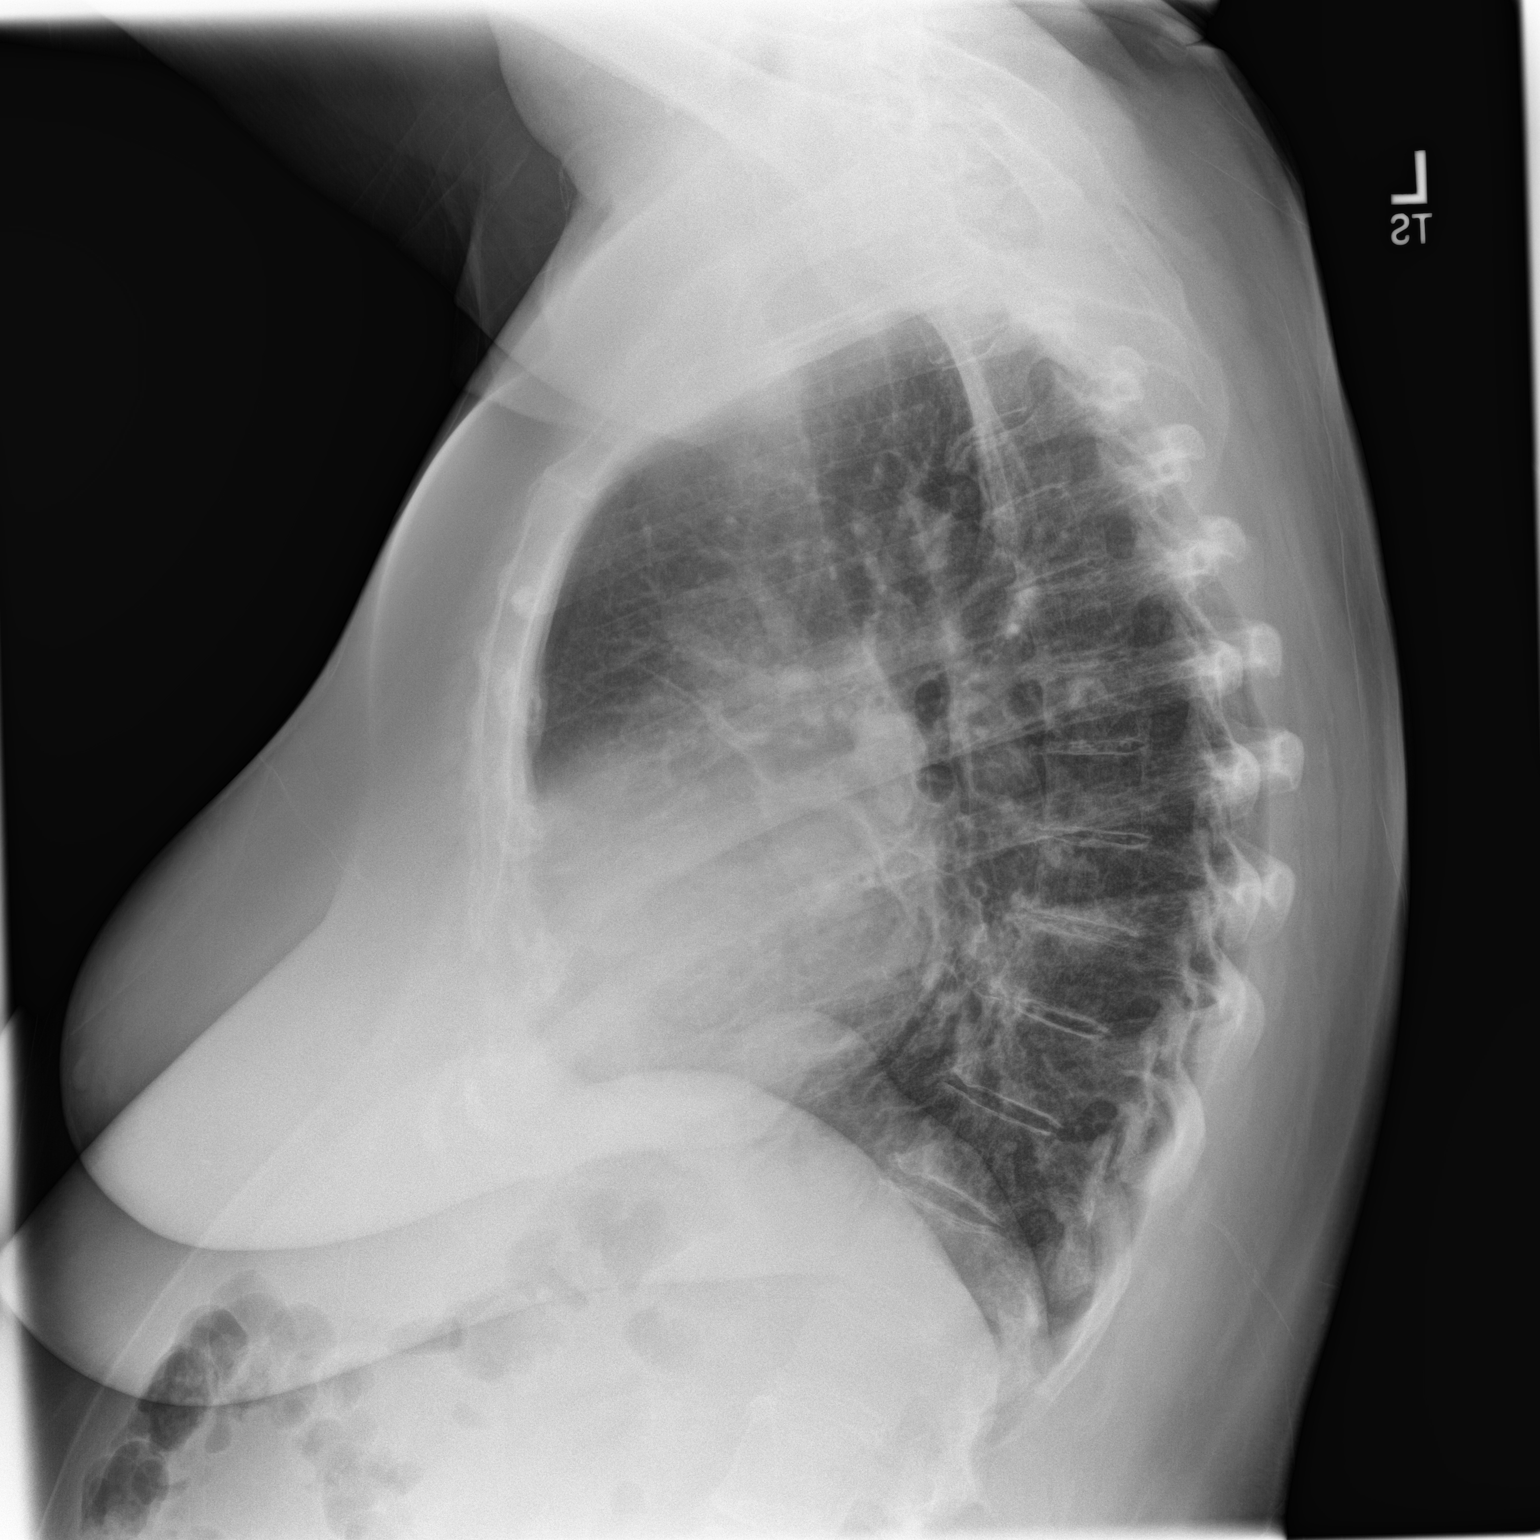

[2 of 2 positions shown; findings below may reference images not displayed]

FINDINGS: The parenchymal opacities described previously have largely cleared
with minimal linear atelectasis or scarring remaining probably in
the lingula. No effusion is seen. The mediastinal and hilar contours
are unremarkable. There is some peribronchial thickening which may
indicate bronchitis. Mild cardiomegaly is stable. No acute bony
abnormality is seen.
IMPRESSION: 1. Clearing of previously noted foci of pneumonia with minimal
linear atelectasis or scarring on the left.
2. Peribronchial thickening may indicate bronchitis.
3. Stable mild cardiomegaly.

## 2019-04-12 IMAGING — CR DG CHEST 2V
1 series · 2 of 2 positions shown · non-contrast
Comparison: 05/28/2016

CLINICAL DATA: Pneumonia.

EXAM:
CHEST  2 VIEW

[Series 1: dg chest 2 view · 0.14mm/px · 2 of 2 slices shown]
[im 1/2]
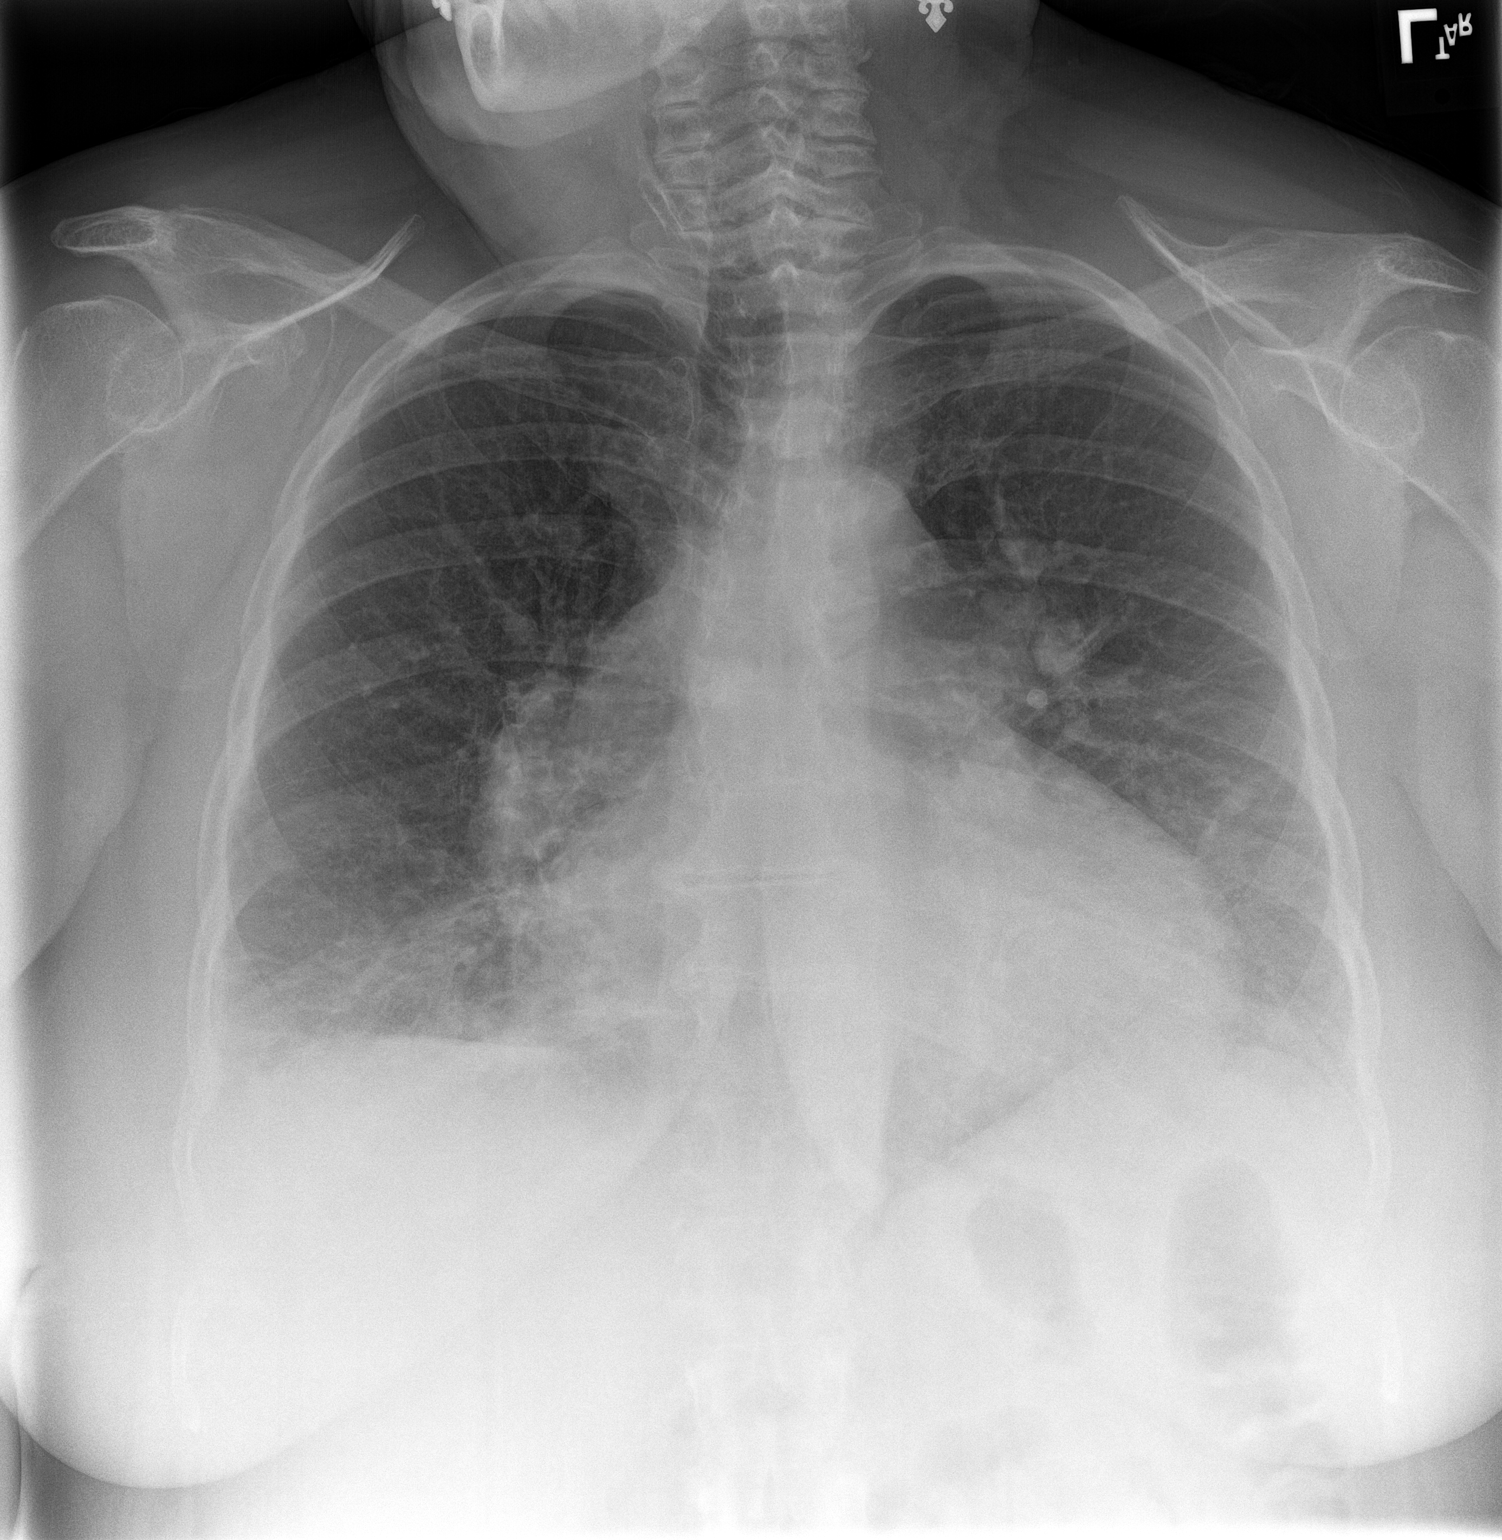
[im 2/2]
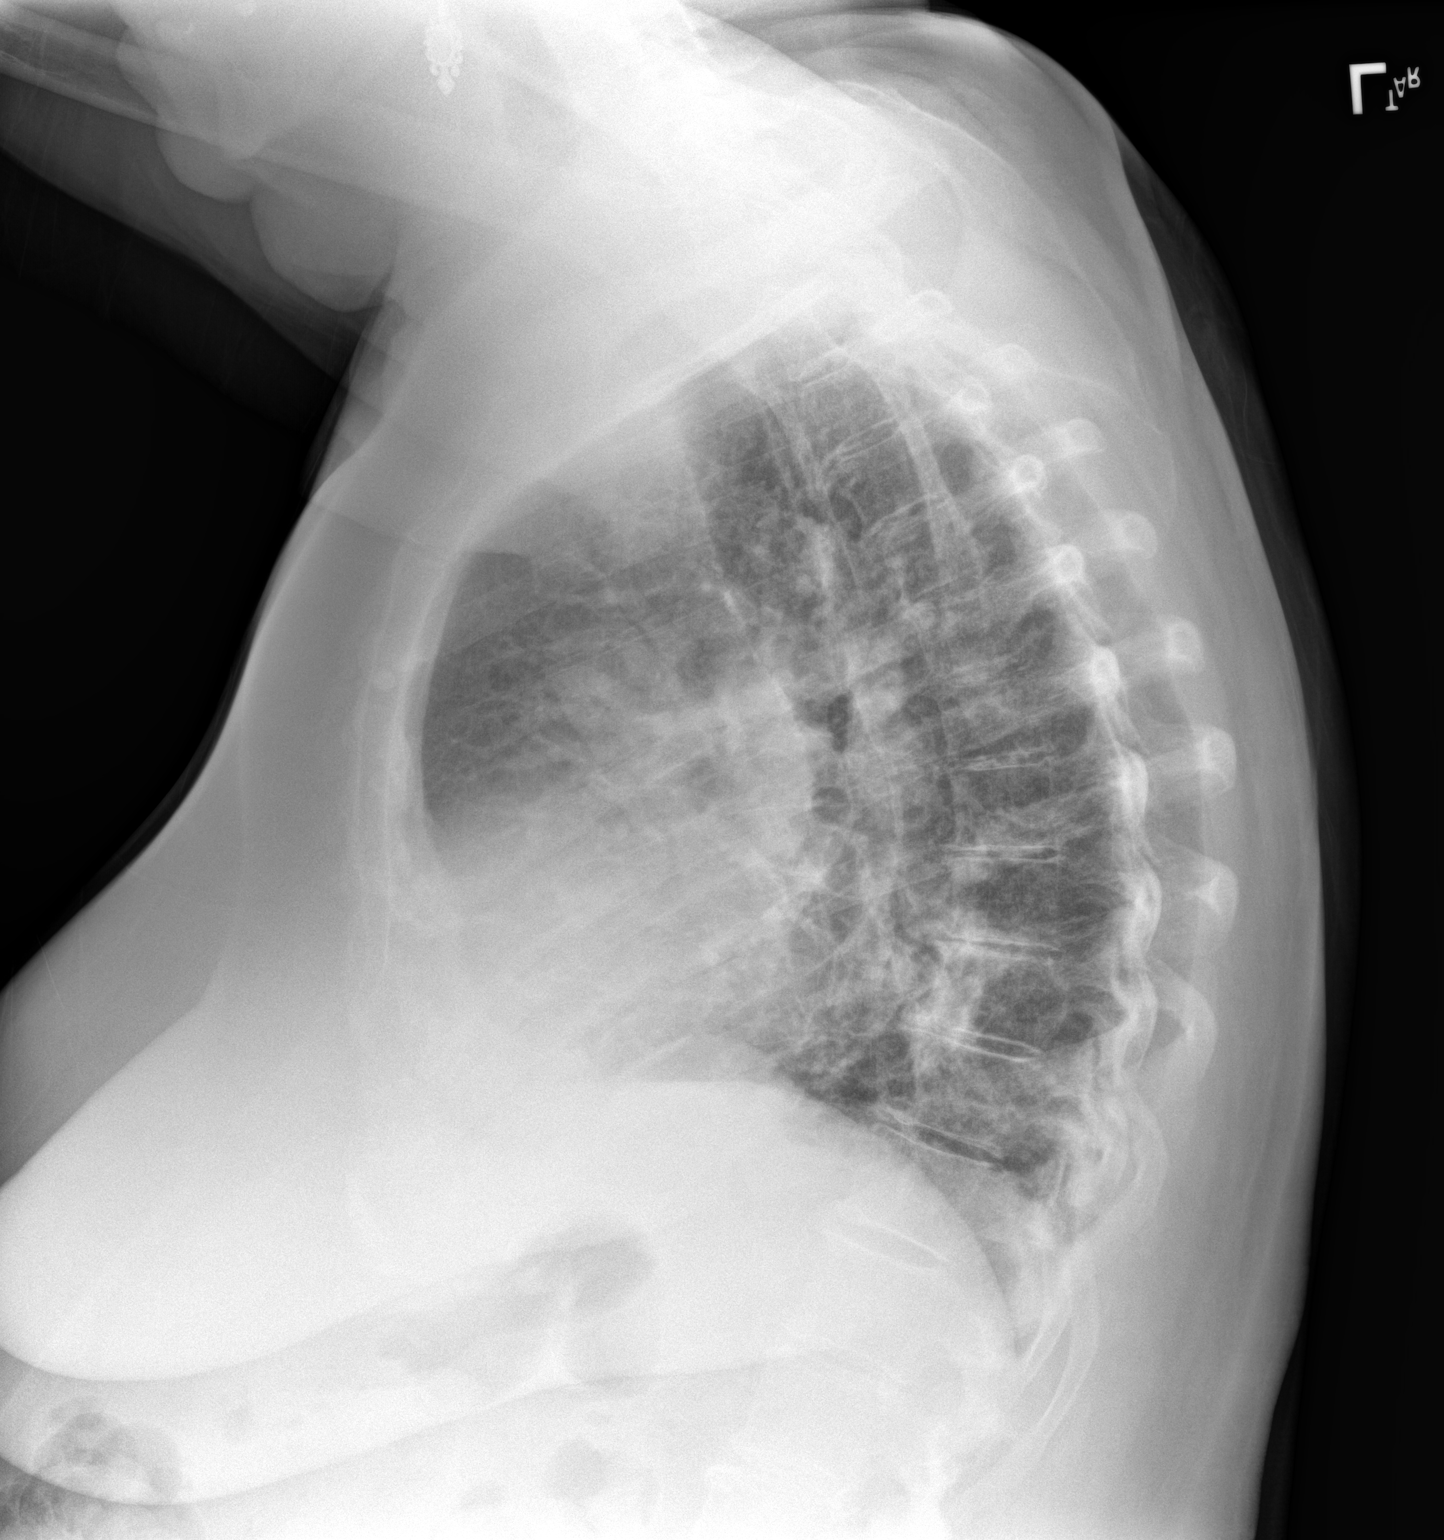

[2 of 2 positions shown; findings below may reference images not displayed]

FINDINGS: The cardio pericardial silhouette is enlarged. There is pulmonary
vascular congestion without overt pulmonary edema. Slight
improvement in right base patchy opacity. Left base patchy airspace
disease not substantially changed. No substantial pleural effusion.
The visualized bony structures of the thorax are intact.
IMPRESSION: Interval improvement in right basilar aeration with stable patchy
airspace opacity at the left base.

## 2019-04-21 IMAGING — CT CT CHEST W/O CM
2 of 3 series · 15 of 36 positions shown, 18 images · non-contrast
Comparison: Chest radiographs 08/04/2016

CLINICAL DATA: Pneumonia.  Cough and shortness of breath.

EXAM:
CT CHEST WITHOUT CONTRAST
TECHNIQUE: Multidetector CT imaging of the chest was performed following the
standard protocol without IV contrast.

[Series 2: thorax · axial · 0.71mm/px · z∈[-748,-506]mm · 12 of 143 slices shown, 15 images]
[im 11/143  mediastinal]
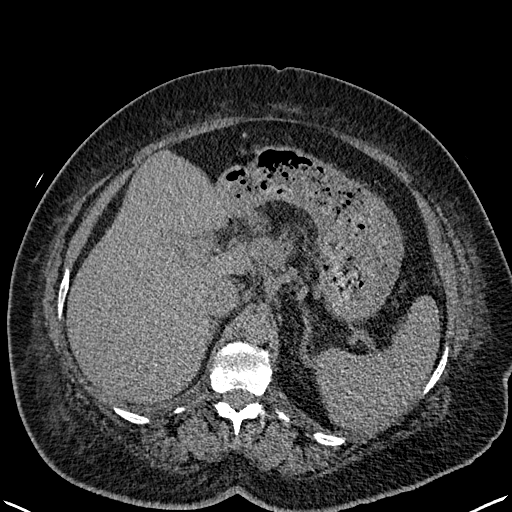
[im 11/143  lung]
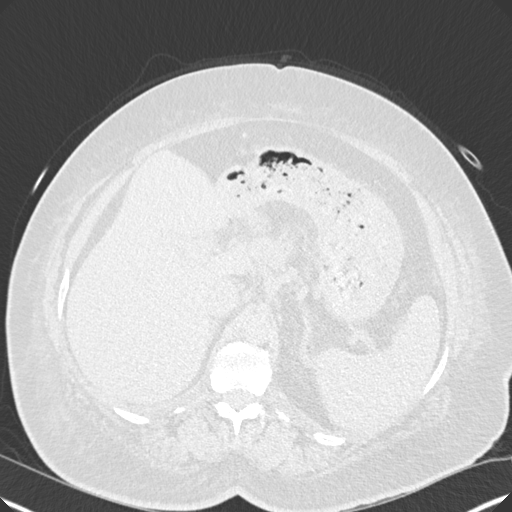
[im 22/143  lung]
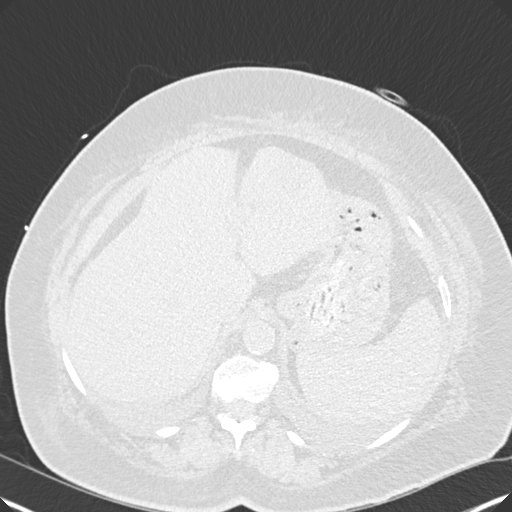
[im 32/143  lung]
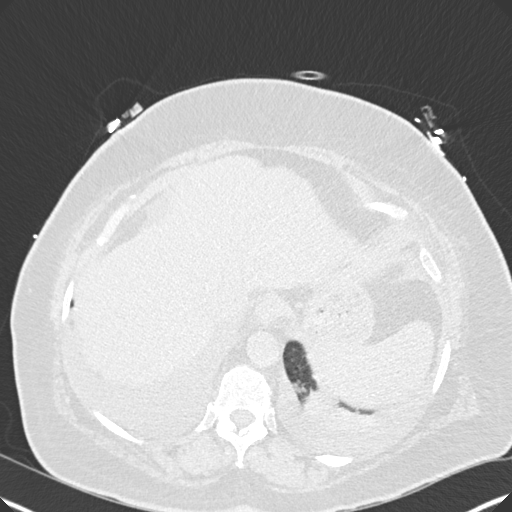
[im 43/143  lung]
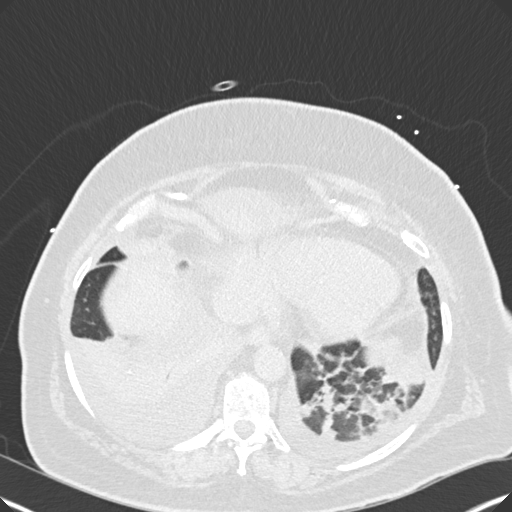
[im 53/143  mediastinal]
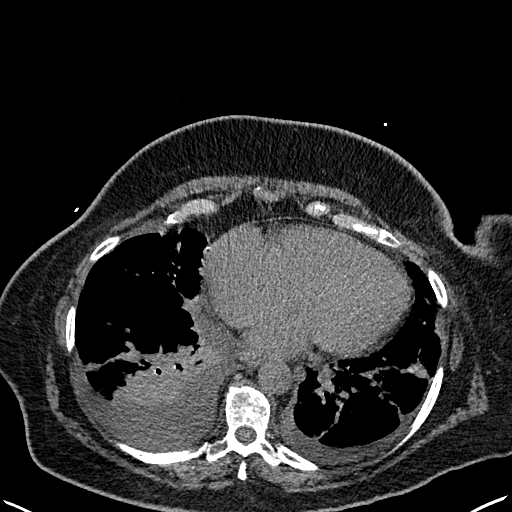
[im 53/143  lung]
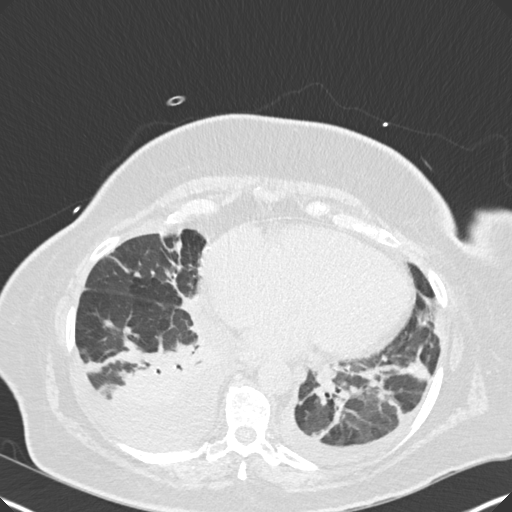
[im 64/143  lung]
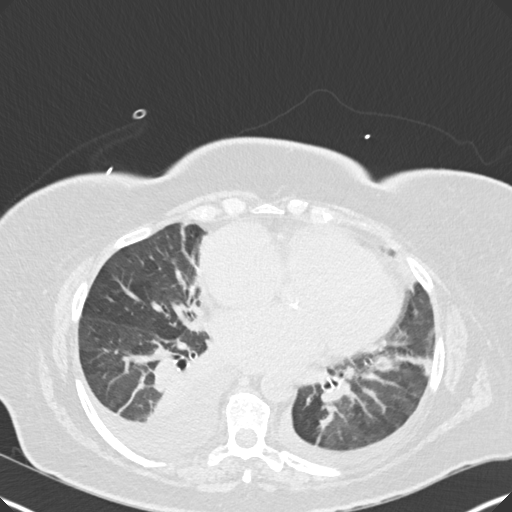
[im 79/143  lung]
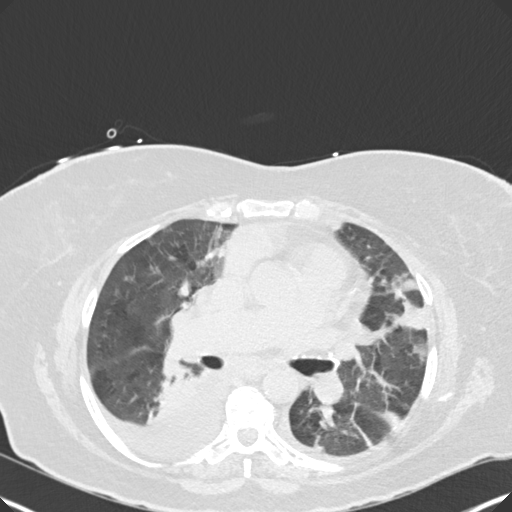
[im 90/143  lung]
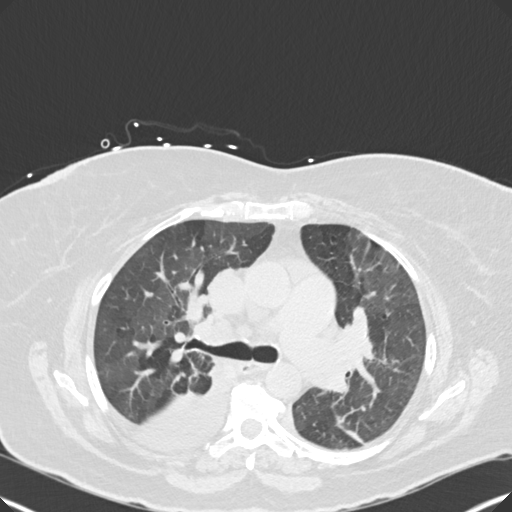
[im 100/143  mediastinal]
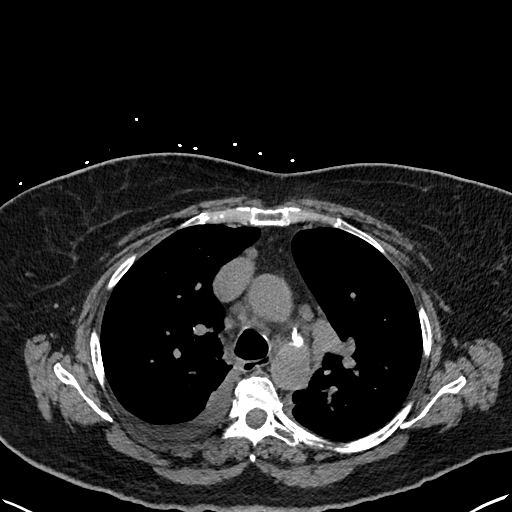
[im 100/143  lung]
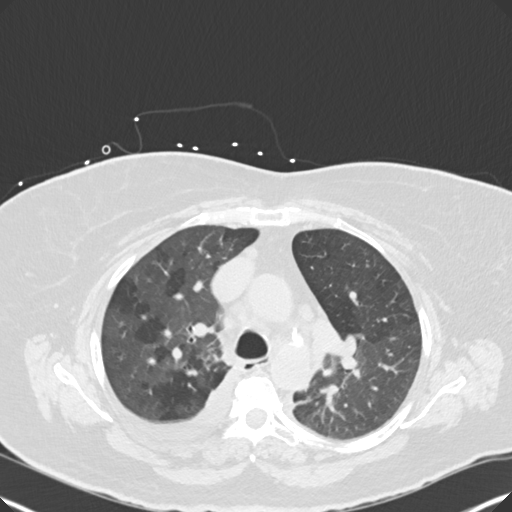
[im 111/143  lung]
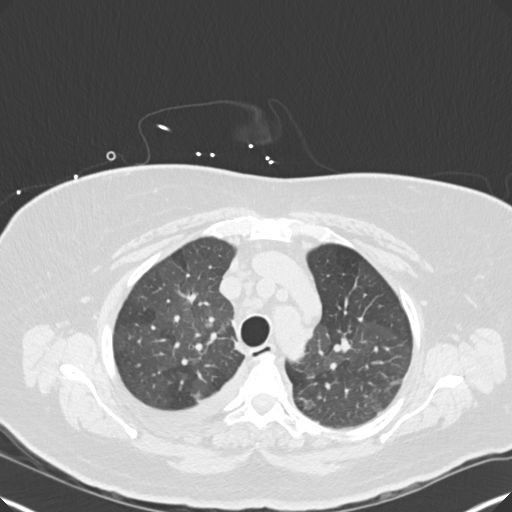
[im 121/143  lung]
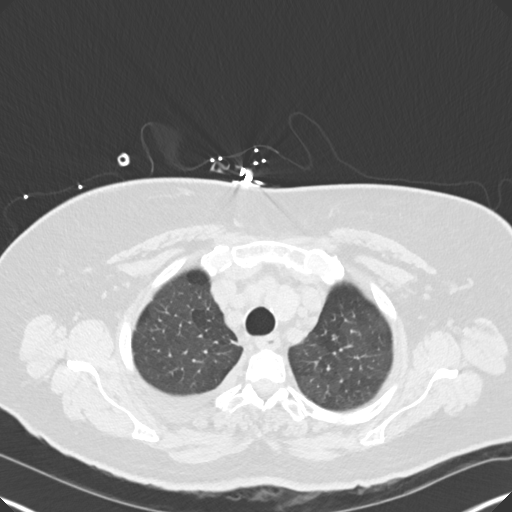
[im 132/143  lung]
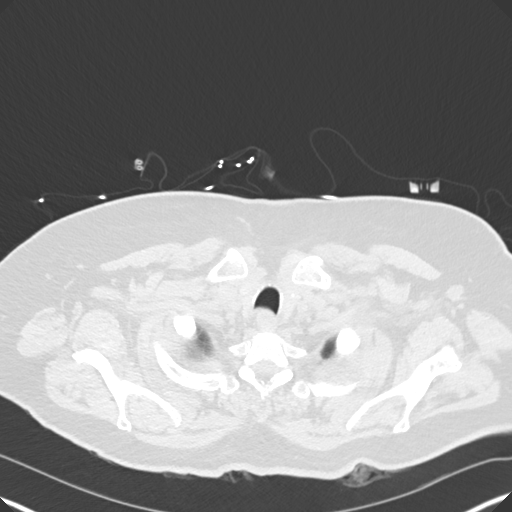

[Series 5: coronal · coronal · 0.59mm/px · 3 of 126 slices shown]
[im 26/126  lung]
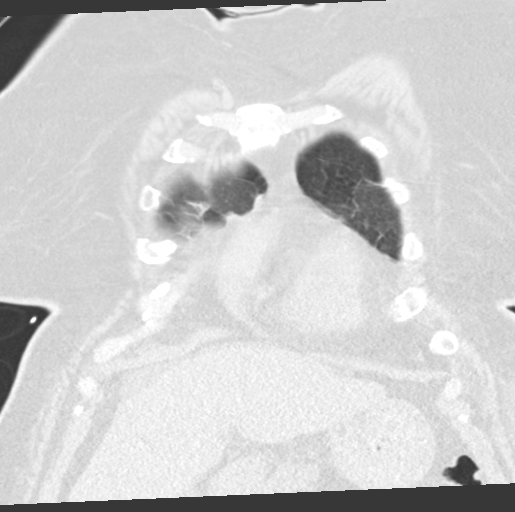
[im 51/126  lung]
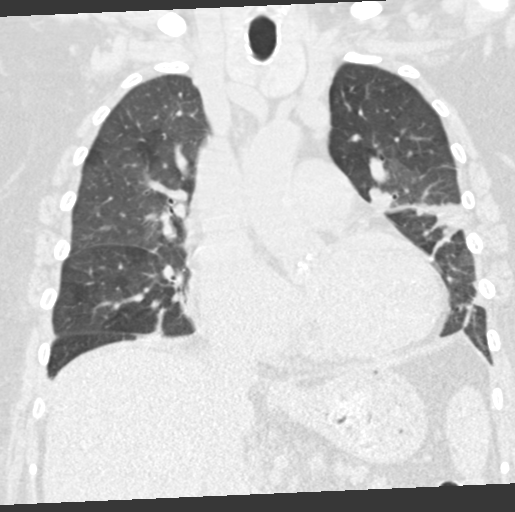
[im 76/126  lung]
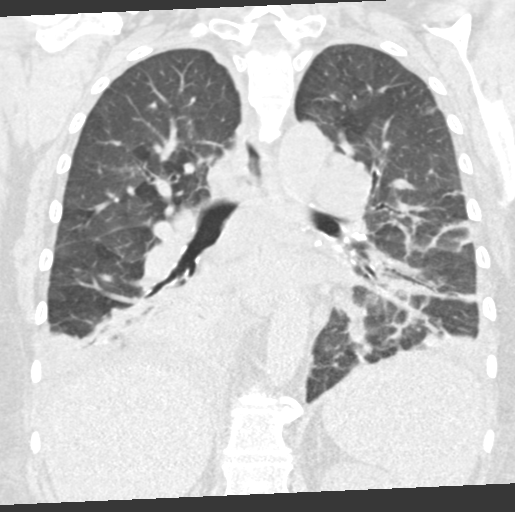

[15 of 36 positions shown; findings below may reference images not displayed]

FINDINGS: Cardiovascular: Normal caliber of the thoracic aorta with mild
atherosclerotic calcification. Mild enlargement of the main
pulmonary artery may reflect underlying pulmonary arterial
hypertension. Mild coronary artery calcification. Mild cardiomegaly.
No pericardial effusion.

Mediastinum/Nodes: There is mild nodular enlargement of the left
thyroid lobe and isthmus, with an approximately 1.6 cm nodule
extending from the isthmus and with a left lower pole nodule
difficult to discretely measure. There is an increased number of
small lymph nodes throughout the mediastinum measuring up to 1.1 cm
in short axis, likely reactive. No axillary or definite hilar
lymphadenopathy is identified on this unenhanced study. Unremarkable
esophagus.

Lungs/Pleura: There are small pleural effusions, right larger than
left. Dense consolidation is present in the right lower lobe with
air bronchograms. There are patchy areas of consolidation in the
left lower lobe, right middle lobe, and lingula. Minimal nodular
opacity is present anteriorly in the right upper lobe. Mosaic
attenuation of both upper lobes may reflect mosaic perfusion of
vascular origin given evidence of pulmonary hypertension.

Upper Abdomen: There is prominence of the lateral segment of the
left hepatic lobe which demonstrates a mildly nodular contour. There
may also be a recanalized paraumbilical vein.

Musculoskeletal: Moderate thoracic disc degeneration. No suspicious
osseous lesion.
IMPRESSION: 1. Dense right lower lobe consolidation with patchy consolidation
elsewhere in both lungs compatible with pneumonia.
2. Small pleural effusions, right larger than left.
3. Findings suggesting pulmonary arterial hypertension.
4. Morphologic liver changes which may reflect underlying cirrhosis.
5.  Aortic Atherosclerosis ([id number]).
